# Patient Record
Sex: Female | Born: 1935 | Race: White | Hispanic: No | Smoking: Former smoker
Health system: Southern US, Community
[De-identification: ages and names within clinical notes are randomized; demographics above are authoritative.]

## PROBLEM LIST (undated history)

## (undated) DIAGNOSIS — F329 Major depressive disorder, single episode, unspecified: Secondary | ICD-10-CM

## (undated) DIAGNOSIS — H3530 Unspecified macular degeneration: Secondary | ICD-10-CM

## (undated) DIAGNOSIS — I340 Nonrheumatic mitral (valve) insufficiency: Secondary | ICD-10-CM

## (undated) DIAGNOSIS — R17 Unspecified jaundice: Secondary | ICD-10-CM

## (undated) DIAGNOSIS — E785 Hyperlipidemia, unspecified: Secondary | ICD-10-CM

## (undated) DIAGNOSIS — E042 Nontoxic multinodular goiter: Secondary | ICD-10-CM

## (undated) DIAGNOSIS — I4891 Unspecified atrial fibrillation: Secondary | ICD-10-CM

## (undated) DIAGNOSIS — I2510 Atherosclerotic heart disease of native coronary artery without angina pectoris: Secondary | ICD-10-CM

## (undated) DIAGNOSIS — H9190 Unspecified hearing loss, unspecified ear: Secondary | ICD-10-CM

## (undated) DIAGNOSIS — D509 Iron deficiency anemia, unspecified: Secondary | ICD-10-CM

## (undated) DIAGNOSIS — I422 Other hypertrophic cardiomyopathy: Secondary | ICD-10-CM

## (undated) DIAGNOSIS — F32A Depression, unspecified: Secondary | ICD-10-CM

## (undated) DIAGNOSIS — Z86010 Personal history of colonic polyps: Secondary | ICD-10-CM

## (undated) DIAGNOSIS — M1990 Unspecified osteoarthritis, unspecified site: Secondary | ICD-10-CM

## (undated) DIAGNOSIS — K2270 Barrett's esophagus without dysplasia: Secondary | ICD-10-CM

## (undated) DIAGNOSIS — K5730 Diverticulosis of large intestine without perforation or abscess without bleeding: Secondary | ICD-10-CM

## (undated) DIAGNOSIS — I350 Nonrheumatic aortic (valve) stenosis: Secondary | ICD-10-CM

## (undated) DIAGNOSIS — H548 Legal blindness, as defined in USA: Secondary | ICD-10-CM

## (undated) DIAGNOSIS — N888 Other specified noninflammatory disorders of cervix uteri: Secondary | ICD-10-CM

## (undated) DIAGNOSIS — K219 Gastro-esophageal reflux disease without esophagitis: Secondary | ICD-10-CM

## (undated) DIAGNOSIS — E039 Hypothyroidism, unspecified: Secondary | ICD-10-CM

## (undated) DIAGNOSIS — I10 Essential (primary) hypertension: Secondary | ICD-10-CM

## (undated) HISTORY — DX: Diverticulosis of large intestine without perforation or abscess without bleeding: K57.30

## (undated) HISTORY — PX: INCONTINENCE SURGERY: SHX676

## (undated) HISTORY — DX: Hypothyroidism, unspecified: E03.9

## (undated) HISTORY — DX: Personal history of colonic polyps: Z86.010

## (undated) HISTORY — DX: Nontoxic multinodular goiter: E04.2

## (undated) HISTORY — PX: KNEE ARTHROSCOPY: SHX127

## (undated) HISTORY — DX: Essential (primary) hypertension: I10

## (undated) HISTORY — DX: Unspecified atrial fibrillation: I48.91

## (undated) HISTORY — PX: TONSILLECTOMY AND ADENOIDECTOMY: SUR1326

## (undated) HISTORY — DX: Other hypertrophic cardiomyopathy: I42.2

## (undated) HISTORY — DX: Unspecified osteoarthritis, unspecified site: M19.90

## (undated) HISTORY — PX: ROTATOR CUFF REPAIR: SHX139

## (undated) HISTORY — DX: Iron deficiency anemia, unspecified: D50.9

## (undated) HISTORY — DX: Other specified noninflammatory disorders of cervix uteri: N88.8

## (undated) HISTORY — DX: Barrett's esophagus without dysplasia: K22.70

## (undated) HISTORY — PX: DOPPLER ECHOCARDIOGRAPHY: SHX263

---
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
## 1979-12-07 HISTORY — PX: REDUCTION MAMMAPLASTY: SUR839

## 1989-08-06 HISTORY — PX: BLADDER SURGERY: SHX569

## 1998-11-05 LAB — HM DEXA SCAN

## 1998-11-11 ENCOUNTER — Other Ambulatory Visit: Admission: RE | Admit: 1998-11-11 | Discharge: 1998-11-11 | Payer: Self-pay | Admitting: Family Medicine

## 1999-04-20 ENCOUNTER — Other Ambulatory Visit: Admission: RE | Admit: 1999-04-20 | Discharge: 1999-04-20 | Payer: Self-pay | Admitting: Obstetrics and Gynecology

## 1999-04-21 ENCOUNTER — Other Ambulatory Visit: Admission: RE | Admit: 1999-04-21 | Discharge: 1999-04-21 | Payer: Self-pay | Admitting: Obstetrics and Gynecology

## 2000-06-06 ENCOUNTER — Encounter: Payer: Self-pay | Admitting: Family Medicine

## 2000-06-06 ENCOUNTER — Encounter: Admission: RE | Admit: 2000-06-06 | Discharge: 2000-06-06 | Payer: Self-pay | Admitting: Family Medicine

## 2000-07-05 ENCOUNTER — Other Ambulatory Visit: Admission: RE | Admit: 2000-07-05 | Discharge: 2000-07-05 | Payer: Self-pay | Admitting: Obstetrics and Gynecology

## 2000-07-05 ENCOUNTER — Encounter (INDEPENDENT_AMBULATORY_CARE_PROVIDER_SITE_OTHER): Payer: Self-pay | Admitting: Specialist

## 2000-08-25 ENCOUNTER — Other Ambulatory Visit: Admission: RE | Admit: 2000-08-25 | Discharge: 2000-08-25 | Payer: Self-pay | Admitting: Gastroenterology

## 2000-08-25 ENCOUNTER — Encounter (INDEPENDENT_AMBULATORY_CARE_PROVIDER_SITE_OTHER): Payer: Self-pay | Admitting: Specialist

## 2000-09-02 ENCOUNTER — Ambulatory Visit (HOSPITAL_COMMUNITY): Admission: RE | Admit: 2000-09-02 | Discharge: 2000-09-02 | Payer: Self-pay | Admitting: Gastroenterology

## 2000-09-02 ENCOUNTER — Encounter: Payer: Self-pay | Admitting: Gastroenterology

## 2000-09-05 ENCOUNTER — Encounter: Payer: Self-pay | Admitting: Obstetrics and Gynecology

## 2000-09-07 ENCOUNTER — Ambulatory Visit (HOSPITAL_COMMUNITY): Admission: RE | Admit: 2000-09-07 | Discharge: 2000-09-07 | Payer: Self-pay | Admitting: Obstetrics and Gynecology

## 2000-09-07 ENCOUNTER — Encounter (INDEPENDENT_AMBULATORY_CARE_PROVIDER_SITE_OTHER): Payer: Self-pay | Admitting: Specialist

## 2000-09-07 HISTORY — PX: DILATION AND CURETTAGE OF UTERUS: SHX78

## 2001-12-06 HISTORY — PX: CATARACT EXTRACTION, BILATERAL: SHX1313

## 2003-07-29 HISTORY — PX: TEAR DUCT PROBING: SHX793

## 2003-09-17 ENCOUNTER — Encounter: Payer: Self-pay | Admitting: Urology

## 2003-09-17 ENCOUNTER — Ambulatory Visit (HOSPITAL_BASED_OUTPATIENT_CLINIC_OR_DEPARTMENT_OTHER): Admission: RE | Admit: 2003-09-17 | Discharge: 2003-09-17 | Payer: Self-pay | Admitting: Urology

## 2003-09-17 ENCOUNTER — Ambulatory Visit (HOSPITAL_COMMUNITY): Admission: RE | Admit: 2003-09-17 | Discharge: 2003-09-17 | Payer: Self-pay | Admitting: Urology

## 2003-09-17 HISTORY — PX: CYSTOURETHROSCOPY: SHX476

## 2003-09-26 ENCOUNTER — Ambulatory Visit (HOSPITAL_COMMUNITY): Admission: RE | Admit: 2003-09-26 | Discharge: 2003-09-26 | Payer: Self-pay | Admitting: *Deleted

## 2003-09-26 ENCOUNTER — Encounter: Payer: Self-pay | Admitting: *Deleted

## 2003-10-14 ENCOUNTER — Ambulatory Visit (HOSPITAL_COMMUNITY): Admission: RE | Admit: 2003-10-14 | Discharge: 2003-10-14 | Payer: Self-pay | Admitting: Cardiology

## 2003-10-14 HISTORY — PX: OTHER SURGICAL HISTORY: SHX169

## 2004-02-04 ENCOUNTER — Encounter: Payer: Self-pay | Admitting: Family Medicine

## 2004-12-16 ENCOUNTER — Ambulatory Visit: Payer: Self-pay | Admitting: Family Medicine

## 2004-12-25 ENCOUNTER — Ambulatory Visit: Payer: Self-pay | Admitting: Family Medicine

## 2004-12-29 ENCOUNTER — Ambulatory Visit: Payer: Self-pay | Admitting: Family Medicine

## 2005-01-20 ENCOUNTER — Ambulatory Visit: Payer: Self-pay | Admitting: Family Medicine

## 2005-01-29 ENCOUNTER — Ambulatory Visit: Payer: Self-pay | Admitting: Family Medicine

## 2005-05-05 ENCOUNTER — Ambulatory Visit: Payer: Self-pay | Admitting: Family Medicine

## 2005-08-31 ENCOUNTER — Ambulatory Visit (HOSPITAL_COMMUNITY): Admission: RE | Admit: 2005-08-31 | Discharge: 2005-09-01 | Payer: Self-pay | Admitting: Orthopedic Surgery

## 2005-09-20 ENCOUNTER — Ambulatory Visit: Payer: Self-pay | Admitting: Family Medicine

## 2006-01-05 ENCOUNTER — Ambulatory Visit: Payer: Self-pay | Admitting: Family Medicine

## 2006-08-09 ENCOUNTER — Ambulatory Visit: Payer: Self-pay | Admitting: Family Medicine

## 2006-08-11 ENCOUNTER — Ambulatory Visit: Payer: Self-pay | Admitting: Family Medicine

## 2006-08-16 ENCOUNTER — Ambulatory Visit: Payer: Self-pay | Admitting: Family Medicine

## 2006-08-26 ENCOUNTER — Ambulatory Visit: Payer: Self-pay | Admitting: Family Medicine

## 2006-08-28 ENCOUNTER — Observation Stay (HOSPITAL_COMMUNITY): Admission: EM | Admit: 2006-08-28 | Discharge: 2006-08-29 | Payer: Self-pay | Admitting: Emergency Medicine

## 2006-08-29 ENCOUNTER — Ambulatory Visit: Payer: Self-pay | Admitting: Cardiology

## 2006-08-29 ENCOUNTER — Ambulatory Visit: Payer: Self-pay | Admitting: Internal Medicine

## 2006-08-29 ENCOUNTER — Encounter: Payer: Self-pay | Admitting: Cardiology

## 2006-08-31 ENCOUNTER — Ambulatory Visit: Payer: Self-pay | Admitting: Family Medicine

## 2006-09-20 ENCOUNTER — Ambulatory Visit: Payer: Self-pay | Admitting: Family Medicine

## 2006-09-28 ENCOUNTER — Emergency Department (HOSPITAL_COMMUNITY): Admission: EM | Admit: 2006-09-28 | Discharge: 2006-09-28 | Payer: Self-pay | Admitting: Emergency Medicine

## 2006-10-10 ENCOUNTER — Ambulatory Visit: Payer: Self-pay | Admitting: Family Medicine

## 2006-10-19 ENCOUNTER — Ambulatory Visit: Payer: Self-pay | Admitting: Cardiology

## 2006-10-21 ENCOUNTER — Ambulatory Visit: Payer: Self-pay

## 2006-10-24 ENCOUNTER — Ambulatory Visit: Payer: Self-pay | Admitting: Family Medicine

## 2006-11-07 ENCOUNTER — Ambulatory Visit: Payer: Self-pay | Admitting: Family Medicine

## 2006-11-25 ENCOUNTER — Ambulatory Visit: Payer: Self-pay | Admitting: Family Medicine

## 2006-12-05 ENCOUNTER — Ambulatory Visit: Payer: Self-pay | Admitting: Family Medicine

## 2006-12-09 ENCOUNTER — Ambulatory Visit: Payer: Self-pay | Admitting: Family Medicine

## 2006-12-15 ENCOUNTER — Ambulatory Visit: Payer: Self-pay | Admitting: Cardiology

## 2006-12-20 ENCOUNTER — Ambulatory Visit: Payer: Self-pay | Admitting: Family Medicine

## 2006-12-26 ENCOUNTER — Ambulatory Visit: Payer: Self-pay | Admitting: Internal Medicine

## 2006-12-26 LAB — CONVERTED CEMR LAB
BUN: 8 mg/dL (ref 6–23)
GFR calc Af Amer: 68 mL/min
GFR calc non Af Amer: 56 mL/min
Potassium: 3.8 meq/L (ref 3.5–5.1)
Sodium: 139 meq/L (ref 135–145)

## 2007-01-17 ENCOUNTER — Ambulatory Visit: Payer: Self-pay | Admitting: Family Medicine

## 2007-01-24 ENCOUNTER — Ambulatory Visit: Payer: Self-pay | Admitting: Family Medicine

## 2007-02-07 ENCOUNTER — Ambulatory Visit: Payer: Self-pay | Admitting: Family Medicine

## 2007-02-22 ENCOUNTER — Ambulatory Visit: Payer: Self-pay | Admitting: Family Medicine

## 2007-03-08 ENCOUNTER — Ambulatory Visit: Payer: Self-pay | Admitting: Family Medicine

## 2007-03-22 ENCOUNTER — Ambulatory Visit: Payer: Self-pay | Admitting: Family Medicine

## 2007-03-27 ENCOUNTER — Ambulatory Visit: Payer: Self-pay | Admitting: Family Medicine

## 2007-03-30 ENCOUNTER — Ambulatory Visit: Payer: Self-pay | Admitting: Cardiology

## 2007-04-04 ENCOUNTER — Ambulatory Visit: Payer: Self-pay | Admitting: Family Medicine

## 2007-05-04 ENCOUNTER — Ambulatory Visit: Payer: Self-pay | Admitting: Family Medicine

## 2007-05-04 LAB — CONVERTED CEMR LAB: INR: 1.4

## 2007-05-19 ENCOUNTER — Ambulatory Visit: Payer: Self-pay | Admitting: Family Medicine

## 2007-05-19 LAB — CONVERTED CEMR LAB
INR: 3.4
Prothrombin Time: 22.2 s

## 2007-06-01 ENCOUNTER — Ambulatory Visit: Payer: Self-pay | Admitting: Family Medicine

## 2007-06-15 ENCOUNTER — Ambulatory Visit: Payer: Self-pay | Admitting: Family Medicine

## 2007-06-22 ENCOUNTER — Ambulatory Visit: Payer: Self-pay | Admitting: Family Medicine

## 2007-06-22 LAB — CONVERTED CEMR LAB
Bilirubin Urine: NEGATIVE
Glucose, Urine, Semiquant: NEGATIVE
Ketones, urine, test strip: NEGATIVE
Protein, U semiquant: NEGATIVE
Specific Gravity, Urine: 1.01
pH: 6

## 2007-07-14 ENCOUNTER — Ambulatory Visit: Payer: Self-pay | Admitting: Family Medicine

## 2007-07-14 LAB — CONVERTED CEMR LAB: INR: 2.4

## 2007-08-08 ENCOUNTER — Encounter: Payer: Self-pay | Admitting: Family Medicine

## 2007-08-08 DIAGNOSIS — E042 Nontoxic multinodular goiter: Secondary | ICD-10-CM

## 2007-08-08 DIAGNOSIS — R32 Unspecified urinary incontinence: Secondary | ICD-10-CM | POA: Insufficient documentation

## 2007-08-08 DIAGNOSIS — K5730 Diverticulosis of large intestine without perforation or abscess without bleeding: Secondary | ICD-10-CM | POA: Insufficient documentation

## 2007-08-08 DIAGNOSIS — M1990 Unspecified osteoarthritis, unspecified site: Secondary | ICD-10-CM | POA: Insufficient documentation

## 2007-08-08 DIAGNOSIS — I10 Essential (primary) hypertension: Secondary | ICD-10-CM

## 2007-08-08 DIAGNOSIS — E039 Hypothyroidism, unspecified: Secondary | ICD-10-CM

## 2007-08-08 DIAGNOSIS — M810 Age-related osteoporosis without current pathological fracture: Secondary | ICD-10-CM | POA: Insufficient documentation

## 2007-08-11 ENCOUNTER — Ambulatory Visit: Payer: Self-pay | Admitting: Family Medicine

## 2007-08-11 LAB — CONVERTED CEMR LAB: INR: 2.4

## 2007-08-16 ENCOUNTER — Ambulatory Visit: Payer: Self-pay | Admitting: Family Medicine

## 2007-08-16 LAB — CONVERTED CEMR LAB
ALT: 14 units/L (ref 0–35)
Albumin: 3.3 g/dL — ABNORMAL LOW (ref 3.5–5.2)
Alkaline Phosphatase: 54 units/L (ref 39–117)
BUN: 11 mg/dL (ref 6–23)
Basophils Relative: 0.4 % (ref 0.0–1.0)
Bilirubin, Direct: 0.1 mg/dL (ref 0.0–0.3)
CO2: 27 meq/L (ref 19–32)
Calcium: 8.8 mg/dL (ref 8.4–10.5)
Chloride: 107 meq/L (ref 96–112)
Cholesterol: 208 mg/dL (ref 0–200)
Creatinine, Ser: 0.8 mg/dL (ref 0.4–1.2)
Direct LDL: 132.8 mg/dL
Free T4: 0.9 ng/dL (ref 0.6–1.6)
GFR calc Af Amer: 88 mL/min
HCT: 32.5 % — ABNORMAL LOW (ref 36.0–46.0)
HDL: 54 mg/dL (ref >39.0)
Monocytes Absolute: 0.6 10*3/uL (ref 0.2–0.7)
Neutro Abs: 2.9 10*3/uL (ref 1.4–7.7)
Neutrophils Relative %: 58.5 % (ref 43.0–77.0)
Platelets: 258 10*3/uL (ref 150–400)
Potassium: 4.4 meq/L (ref 3.5–5.1)
RDW: 14.6 % (ref 11.5–14.6)
Total Bilirubin: 0.5 mg/dL (ref 0.3–1.2)
Total CHOL/HDL Ratio: 3.9
Total Protein: 5.9 g/dL — ABNORMAL LOW (ref 6.0–8.3)
Triglycerides: 103 mg/dL (ref 0–149)
VLDL: 21 mg/dL (ref 0–40)

## 2007-08-18 ENCOUNTER — Ambulatory Visit: Payer: Self-pay | Admitting: Family Medicine

## 2007-08-18 DIAGNOSIS — M549 Dorsalgia, unspecified: Secondary | ICD-10-CM | POA: Insufficient documentation

## 2007-09-11 ENCOUNTER — Ambulatory Visit: Payer: Self-pay | Admitting: Family Medicine

## 2007-09-15 ENCOUNTER — Ambulatory Visit: Payer: Self-pay | Admitting: Internal Medicine

## 2007-09-15 ENCOUNTER — Ambulatory Visit: Payer: Self-pay | Admitting: Family Medicine

## 2007-09-15 LAB — CONVERTED CEMR LAB: Prothrombin Time: 17 s

## 2007-10-09 ENCOUNTER — Ambulatory Visit: Payer: Self-pay | Admitting: Cardiology

## 2007-10-13 ENCOUNTER — Ambulatory Visit: Payer: Self-pay | Admitting: Family Medicine

## 2007-10-13 LAB — CONVERTED CEMR LAB: Prothrombin Time: 15.8 s

## 2007-10-27 ENCOUNTER — Ambulatory Visit: Payer: Self-pay | Admitting: Family Medicine

## 2007-11-01 ENCOUNTER — Ambulatory Visit: Payer: Self-pay | Admitting: Family Medicine

## 2007-11-01 DIAGNOSIS — D509 Iron deficiency anemia, unspecified: Secondary | ICD-10-CM | POA: Insufficient documentation

## 2007-11-01 LAB — CONVERTED CEMR LAB
Eosinophils Relative: 3 % (ref 0.0–5.0)
Ferritin: 6.4 ng/mL — ABNORMAL LOW (ref 10.0–291.0)
Iron: 21 ug/dL — ABNORMAL LOW (ref 42–145)
RDW: 14.9 % — ABNORMAL HIGH (ref 11.5–14.6)

## 2007-11-07 ENCOUNTER — Ambulatory Visit: Payer: Self-pay | Admitting: Family Medicine

## 2007-11-23 ENCOUNTER — Ambulatory Visit: Payer: Self-pay | Admitting: Family Medicine

## 2007-11-23 LAB — CONVERTED CEMR LAB
INR: 2.9
Prothrombin Time: 20.4 s

## 2007-11-27 ENCOUNTER — Telehealth: Payer: Self-pay | Admitting: Family Medicine

## 2007-12-21 ENCOUNTER — Ambulatory Visit: Payer: Self-pay | Admitting: Family Medicine

## 2007-12-21 LAB — CONVERTED CEMR LAB
INR: 2.9
Prothrombin Time: 20.7 s

## 2008-01-18 ENCOUNTER — Ambulatory Visit: Payer: Self-pay | Admitting: Family Medicine

## 2008-01-18 LAB — CONVERTED CEMR LAB

## 2008-02-01 ENCOUNTER — Ambulatory Visit: Payer: Self-pay | Admitting: Family Medicine

## 2008-02-01 LAB — CONVERTED CEMR LAB: INR: 1.9

## 2008-02-06 ENCOUNTER — Ambulatory Visit: Payer: Self-pay | Admitting: Family Medicine

## 2008-02-07 LAB — CONVERTED CEMR LAB
Basophils Relative: 0.9 % (ref 0.0–1.0)
HCT: 38.3 % (ref 36.0–46.0)
Hemoglobin: 12.1 g/dL (ref 12.0–15.0)
Lymphocytes Relative: 27.4 % (ref 12.0–46.0)
MCHC: 31.6 g/dL (ref 30.0–36.0)
MCV: 80 fL (ref 78.0–100.0)
Monocytes Relative: 10.7 % (ref 3.0–11.0)
RDW: 16 % — ABNORMAL HIGH (ref 11.5–14.6)

## 2008-02-08 ENCOUNTER — Ambulatory Visit: Payer: Self-pay | Admitting: Family Medicine

## 2008-02-13 ENCOUNTER — Emergency Department (HOSPITAL_COMMUNITY): Admission: EM | Admit: 2008-02-13 | Discharge: 2008-02-13 | Payer: Self-pay | Admitting: Emergency Medicine

## 2008-02-29 ENCOUNTER — Ambulatory Visit: Payer: Self-pay | Admitting: Family Medicine

## 2008-02-29 LAB — CONVERTED CEMR LAB
INR: 2.2
Prothrombin Time: 18.1 s

## 2008-03-01 ENCOUNTER — Ambulatory Visit: Payer: Self-pay | Admitting: Cardiology

## 2008-03-07 ENCOUNTER — Ambulatory Visit: Payer: Self-pay | Admitting: Cardiology

## 2008-03-07 LAB — CONVERTED CEMR LAB
BUN: 13 mg/dL (ref 6–23)
Calcium: 8.8 mg/dL (ref 8.4–10.5)
Chloride: 105 meq/L (ref 96–112)
Glucose, Bld: 121 mg/dL — ABNORMAL HIGH (ref 70–99)
Potassium: 3.8 meq/L (ref 3.5–5.1)

## 2008-03-19 ENCOUNTER — Ambulatory Visit: Payer: Self-pay

## 2008-03-19 ENCOUNTER — Encounter: Payer: Self-pay | Admitting: Cardiology

## 2008-03-29 ENCOUNTER — Ambulatory Visit: Payer: Self-pay | Admitting: Family Medicine

## 2008-03-29 LAB — CONVERTED CEMR LAB: INR: 1.6

## 2008-04-11 ENCOUNTER — Ambulatory Visit: Payer: Self-pay | Admitting: Family Medicine

## 2008-04-11 LAB — CONVERTED CEMR LAB
INR: 3.9
Prothrombin Time: 24.8 s

## 2008-05-09 ENCOUNTER — Ambulatory Visit: Payer: Self-pay | Admitting: Family Medicine

## 2008-05-09 LAB — CONVERTED CEMR LAB
Basophils Relative: 0 % (ref 0.0–1.0)
Eosinophils Relative: 1.6 % (ref 0.0–5.0)
Lymphocytes Relative: 31.8 % (ref 12.0–46.0)
Neutrophils Relative %: 53.3 % (ref 43.0–77.0)
Platelets: 238 10*3/uL (ref 150–400)
RBC: 4.31 M/uL (ref 3.87–5.11)
WBC: 5.3 10*3/uL (ref 4.5–10.5)

## 2008-05-14 ENCOUNTER — Ambulatory Visit: Payer: Self-pay | Admitting: Family Medicine

## 2008-05-14 LAB — CONVERTED CEMR LAB
INR: 1.4
Prothrombin Time: 14.6 s

## 2008-05-16 ENCOUNTER — Ambulatory Visit: Payer: Self-pay | Admitting: Cardiology

## 2008-05-28 ENCOUNTER — Ambulatory Visit: Payer: Self-pay | Admitting: Family Medicine

## 2008-05-28 LAB — CONVERTED CEMR LAB
INR: 2.5
Prothrombin Time: 19.2 s

## 2008-06-10 ENCOUNTER — Telehealth: Payer: Self-pay | Admitting: Family Medicine

## 2008-06-11 ENCOUNTER — Ambulatory Visit: Payer: Self-pay | Admitting: Family Medicine

## 2008-06-11 LAB — CONVERTED CEMR LAB
Epithelial cells, urine: 0 /lpf
Glucose, Urine, Semiquant: NEGATIVE
Specific Gravity, Urine: <1.005
Urobilinogen, UA: 0.2

## 2008-06-12 ENCOUNTER — Encounter: Payer: Self-pay | Admitting: Family Medicine

## 2008-06-24 ENCOUNTER — Ambulatory Visit: Payer: Self-pay | Admitting: Family Medicine

## 2008-06-24 LAB — CONVERTED CEMR LAB
INR: 2.4
Prothrombin Time: 19 s

## 2008-07-18 ENCOUNTER — Ambulatory Visit: Payer: Self-pay | Admitting: Family Medicine

## 2008-07-18 DIAGNOSIS — R209 Unspecified disturbances of skin sensation: Secondary | ICD-10-CM | POA: Insufficient documentation

## 2008-07-22 ENCOUNTER — Ambulatory Visit: Payer: Self-pay | Admitting: Family Medicine

## 2008-07-22 LAB — CONVERTED CEMR LAB: Prothrombin Time: 25.2 s

## 2008-07-24 ENCOUNTER — Ambulatory Visit: Payer: Self-pay | Admitting: Family Medicine

## 2008-07-24 DIAGNOSIS — M25539 Pain in unspecified wrist: Secondary | ICD-10-CM

## 2008-08-05 ENCOUNTER — Ambulatory Visit: Payer: Self-pay | Admitting: Family Medicine

## 2008-08-19 ENCOUNTER — Ambulatory Visit: Payer: Self-pay | Admitting: Family Medicine

## 2008-08-19 LAB — CONVERTED CEMR LAB
INR: 1.2
Prothrombin Time: 13.6 s

## 2008-08-22 ENCOUNTER — Telehealth: Payer: Self-pay | Admitting: Family Medicine

## 2008-08-26 ENCOUNTER — Ambulatory Visit: Payer: Self-pay | Admitting: Family Medicine

## 2008-08-26 LAB — CONVERTED CEMR LAB: INR: 1.9

## 2008-09-09 ENCOUNTER — Ambulatory Visit: Payer: Self-pay | Admitting: Family Medicine

## 2008-09-23 ENCOUNTER — Ambulatory Visit: Payer: Self-pay | Admitting: Family Medicine

## 2008-09-24 LAB — CONVERTED CEMR LAB
INR: 1.2 — ABNORMAL HIGH (ref 0.8–1.0)
Prothrombin Time: 13.9 s — ABNORMAL HIGH (ref 10.9–13.3)

## 2008-10-08 ENCOUNTER — Ambulatory Visit: Payer: Self-pay | Admitting: Family Medicine

## 2008-10-08 LAB — CONVERTED CEMR LAB
INR: 4
Prothrombin Time: 24.2 s

## 2008-10-23 ENCOUNTER — Ambulatory Visit: Payer: Self-pay | Admitting: Family Medicine

## 2008-10-23 LAB — CONVERTED CEMR LAB: Prothrombin Time: 15.6 s

## 2008-10-30 ENCOUNTER — Ambulatory Visit: Payer: Self-pay | Admitting: Family Medicine

## 2008-10-30 LAB — CONVERTED CEMR LAB: INR: 2.8

## 2008-11-13 ENCOUNTER — Ambulatory Visit: Payer: Self-pay | Admitting: Cardiology

## 2008-11-13 LAB — CONVERTED CEMR LAB
BUN: 11 mg/dL (ref 6–23)
Basophils Relative: 0.6 % (ref 0.0–3.0)
Calcium: 8.7 mg/dL (ref 8.4–10.5)
Creatinine, Ser: 0.7 mg/dL (ref 0.4–1.2)
Eosinophils Absolute: 0.1 10*3/uL (ref 0.0–0.7)
Eosinophils Relative: 1.8 % (ref 0.0–5.0)
GFR calc Af Amer: 103 mL/min
GFR calc non Af Amer: 85 mL/min
Glucose, Bld: 92 mg/dL (ref 70–99)
HCT: 39.4 % (ref 36.0–46.0)
Hemoglobin: 13.3 g/dL (ref 12.0–15.0)
MCV: 88.2 fL (ref 78.0–100.0)
Monocytes Absolute: 0.7 10*3/uL (ref 0.1–1.0)
Monocytes Relative: 10.3 % (ref 3.0–12.0)
Neutro Abs: 4.1 10*3/uL (ref 1.4–7.7)
Potassium: 3.9 meq/L (ref 3.5–5.1)
RBC: 4.46 M/uL (ref 3.87–5.11)
WBC: 6.4 10*3/uL (ref 4.5–10.5)

## 2008-11-25 ENCOUNTER — Ambulatory Visit: Payer: Self-pay | Admitting: Family Medicine

## 2008-12-06 HISTORY — PX: FRACTURE SURGERY: SHX138

## 2008-12-09 ENCOUNTER — Ambulatory Visit: Payer: Self-pay | Admitting: Family Medicine

## 2008-12-09 LAB — CONVERTED CEMR LAB: Prothrombin Time: 15.7 s

## 2008-12-23 ENCOUNTER — Ambulatory Visit: Payer: Self-pay | Admitting: Family Medicine

## 2008-12-23 LAB — CONVERTED CEMR LAB: INR: 2.7

## 2008-12-26 ENCOUNTER — Ambulatory Visit: Payer: Self-pay | Admitting: Family Medicine

## 2009-01-13 ENCOUNTER — Ambulatory Visit: Payer: Self-pay | Admitting: Family Medicine

## 2009-01-13 DIAGNOSIS — S8000XA Contusion of unspecified knee, initial encounter: Secondary | ICD-10-CM | POA: Insufficient documentation

## 2009-01-13 LAB — CONVERTED CEMR LAB
Basophils Absolute: 0 10*3/uL (ref 0.0–0.1)
Basophils Relative: 0.7 % (ref 0.0–3.0)
Bilirubin, Direct: 0.1 mg/dL (ref 0.0–0.3)
Calcium: 8.7 mg/dL (ref 8.4–10.5)
Cholesterol: 196 mg/dL (ref 0–200)
Creatinine, Ser: 0.8 mg/dL (ref 0.4–1.2)
Creatinine,U: 48.4 mg/dL
Eosinophils Absolute: 0.1 10*3/uL (ref 0.0–0.7)
Ferritin: 15.2 ng/mL (ref 10.0–291.0)
GFR calc non Af Amer: 73 mL/min
Hemoglobin: 12.4 g/dL (ref 12.0–15.0)
MCHC: 34.3 g/dL (ref 30.0–36.0)
MCV: 88.8 fL (ref 78.0–100.0)
Microalb Creat Ratio: 12.4 mg/g (ref 0.0–30.0)
Microalb, Ur: 0.6 mg/dL (ref 0.0–1.9)
Neutro Abs: 3.2 10*3/uL (ref 1.4–7.7)
Neutrophils Relative %: 59.3 % (ref 43.0–77.0)
RBC: 4.08 M/uL (ref 3.87–5.11)
RDW: 14 % (ref 11.5–14.6)
Sodium: 138 meq/L (ref 135–145)
Total Bilirubin: 0.5 mg/dL (ref 0.3–1.2)
Triglycerides: 97 mg/dL (ref 0–149)
VLDL: 19 mg/dL (ref 0–40)
Vitamin B-12: 385 pg/mL (ref 211–911)

## 2009-01-16 ENCOUNTER — Ambulatory Visit: Payer: Self-pay | Admitting: Family Medicine

## 2009-01-17 ENCOUNTER — Encounter: Payer: Self-pay | Admitting: Family Medicine

## 2009-01-23 ENCOUNTER — Ambulatory Visit: Payer: Self-pay | Admitting: Family Medicine

## 2009-01-30 ENCOUNTER — Ambulatory Visit: Payer: Self-pay | Admitting: Family Medicine

## 2009-01-30 LAB — CONVERTED CEMR LAB: INR: 2.3

## 2009-02-06 ENCOUNTER — Ambulatory Visit: Payer: Self-pay | Admitting: Family Medicine

## 2009-02-06 LAB — CONVERTED CEMR LAB
INR: 3.3
Prothrombin Time: 21.8 s

## 2009-02-13 ENCOUNTER — Ambulatory Visit: Payer: Self-pay | Admitting: Family Medicine

## 2009-02-13 LAB — CONVERTED CEMR LAB
INR: 1.6
Prothrombin Time: 15.6 s

## 2009-02-27 ENCOUNTER — Ambulatory Visit: Payer: Self-pay | Admitting: Family Medicine

## 2009-03-27 ENCOUNTER — Ambulatory Visit: Payer: Self-pay | Admitting: Family Medicine

## 2009-03-27 LAB — CONVERTED CEMR LAB: INR: 1.9

## 2009-04-04 ENCOUNTER — Encounter: Payer: Self-pay | Admitting: Family Medicine

## 2009-04-04 LAB — HM MAMMOGRAPHY: HM Mammogram: NORMAL

## 2009-04-17 ENCOUNTER — Ambulatory Visit: Payer: Self-pay | Admitting: Family Medicine

## 2009-04-19 ENCOUNTER — Inpatient Hospital Stay (HOSPITAL_COMMUNITY): Admission: EM | Admit: 2009-04-19 | Discharge: 2009-04-25 | Payer: Self-pay | Admitting: Emergency Medicine

## 2009-04-23 ENCOUNTER — Encounter (INDEPENDENT_AMBULATORY_CARE_PROVIDER_SITE_OTHER): Payer: Self-pay | Admitting: General Surgery

## 2009-04-23 ENCOUNTER — Ambulatory Visit: Payer: Self-pay | Admitting: Vascular Surgery

## 2009-05-08 DIAGNOSIS — D649 Anemia, unspecified: Secondary | ICD-10-CM | POA: Insufficient documentation

## 2009-05-08 DIAGNOSIS — I428 Other cardiomyopathies: Secondary | ICD-10-CM | POA: Insufficient documentation

## 2009-05-08 DIAGNOSIS — J309 Allergic rhinitis, unspecified: Secondary | ICD-10-CM | POA: Insufficient documentation

## 2009-05-09 ENCOUNTER — Ambulatory Visit: Payer: Self-pay | Admitting: Cardiology

## 2009-05-09 DIAGNOSIS — I421 Obstructive hypertrophic cardiomyopathy: Secondary | ICD-10-CM

## 2009-05-26 ENCOUNTER — Ambulatory Visit: Payer: Self-pay

## 2009-05-26 ENCOUNTER — Encounter: Payer: Self-pay | Admitting: Cardiology

## 2009-06-02 ENCOUNTER — Encounter: Admission: RE | Admit: 2009-06-02 | Discharge: 2009-06-02 | Payer: Self-pay | Admitting: Neurosurgery

## 2009-06-30 ENCOUNTER — Encounter: Admission: RE | Admit: 2009-06-30 | Discharge: 2009-06-30 | Payer: Self-pay | Admitting: Neurosurgery

## 2009-07-14 ENCOUNTER — Telehealth: Payer: Self-pay | Admitting: Family Medicine

## 2009-07-17 ENCOUNTER — Ambulatory Visit: Payer: Self-pay | Admitting: Family Medicine

## 2009-07-17 LAB — CONVERTED CEMR LAB
Bilirubin Urine: NEGATIVE
Specific Gravity, Urine: 1.015
Urobilinogen, UA: 0.2
pH: 6.5

## 2009-07-19 ENCOUNTER — Encounter: Payer: Self-pay | Admitting: Family Medicine

## 2009-08-27 IMAGING — CT CT HEAD W/O CM
1 series · 16 of 28 positions shown, 20 images · non-contrast
Comparison: 06/02/2009

CLINICAL DATA: Head fracture.  Follow-up

CT HEAD WITHOUT CONTRAST
TECHNIQUE: Contiguous axial images were obtained from the base of
the skull through the vertex without contrast.

[Series 32: 3d filtered head · axial · 0.49mm/px · z∈[+17,+146]mm · 16 of 28 slices shown, 20 images]
[im 2/28  brain]
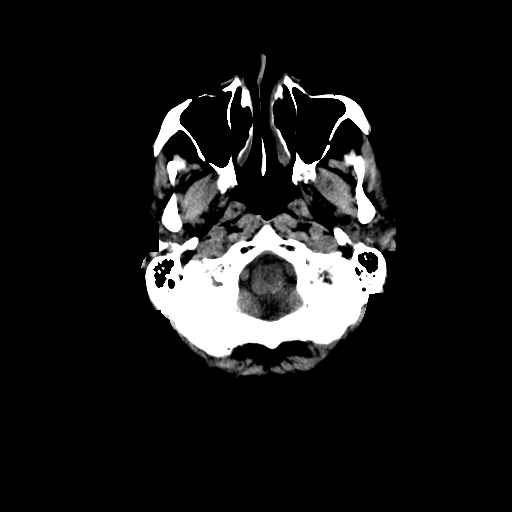
[im 2/28  bone]
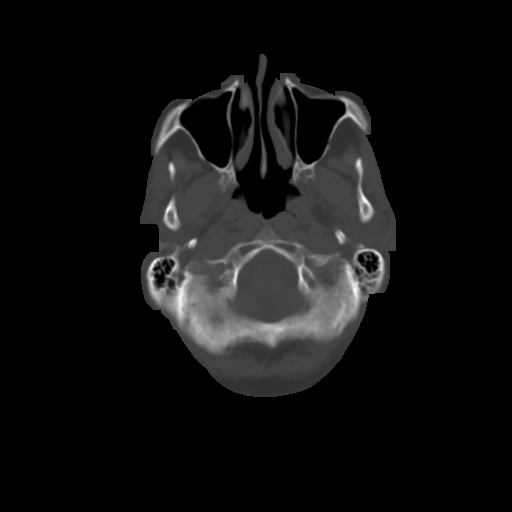
[im 4/28  brain]
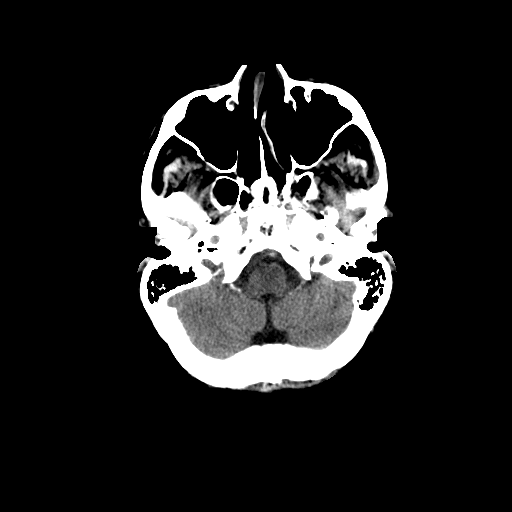
[im 6/28  brain]
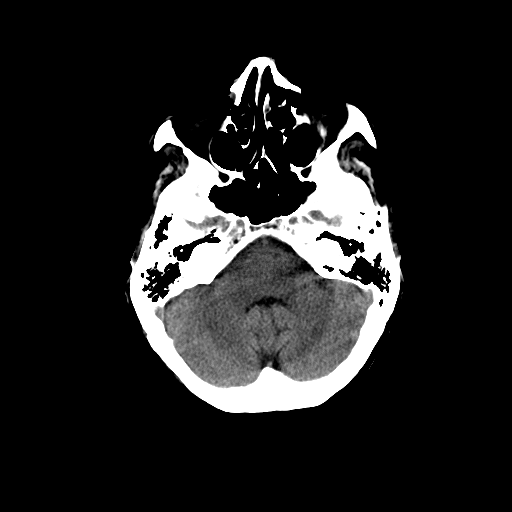
[im 7/28  brain]
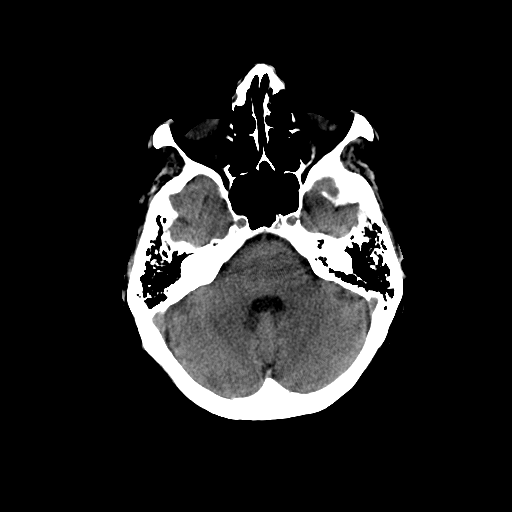
[im 9/28  brain]
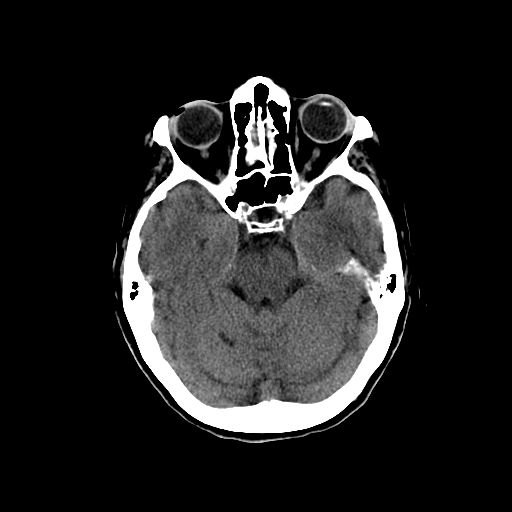
[im 9/28  bone]
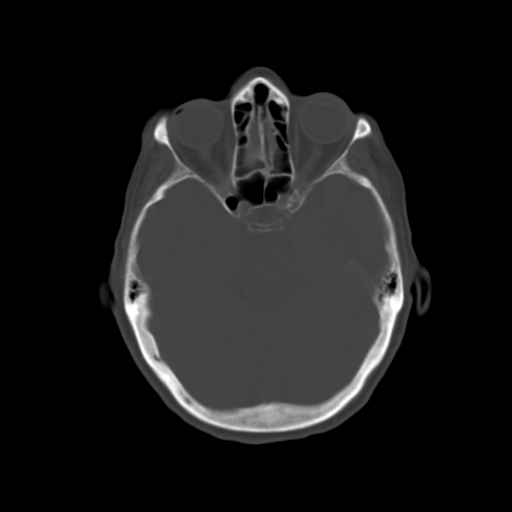
[im 10/28  brain]
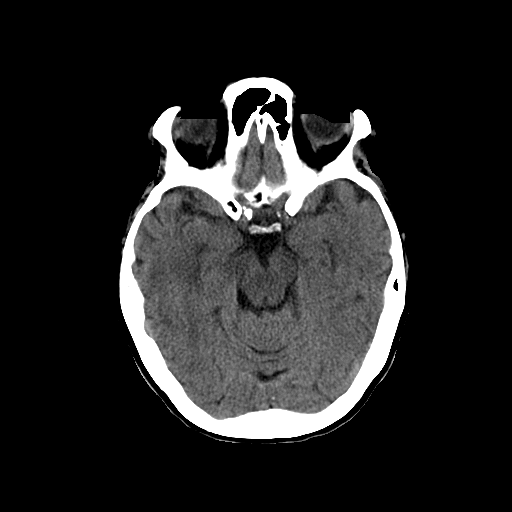
[im 12/28  brain]
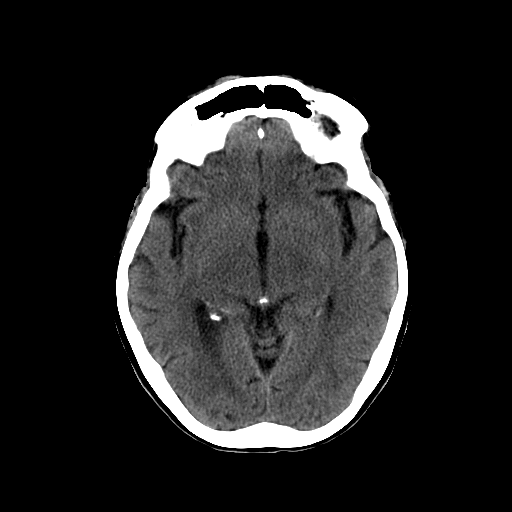
[im 14/28  brain]
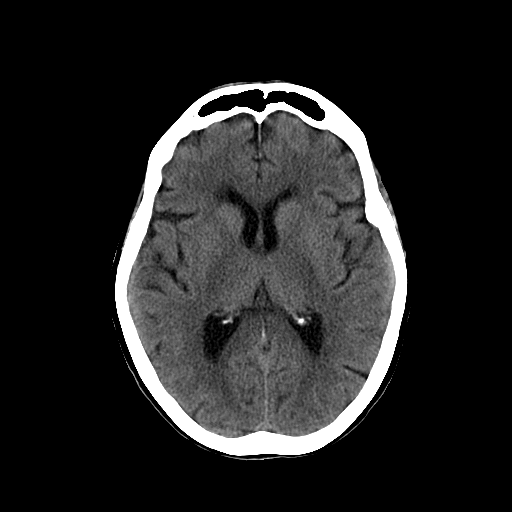
[im 15/28  brain]
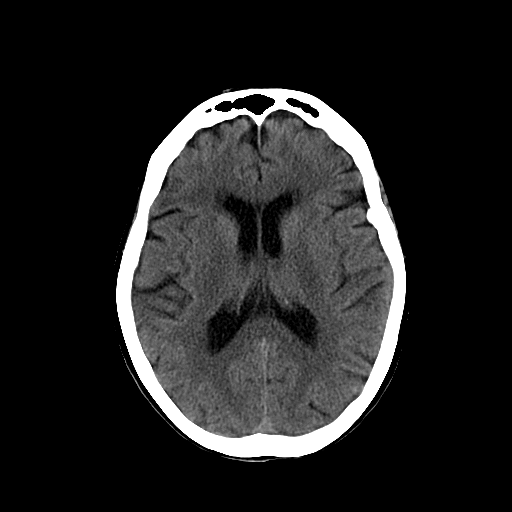
[im 15/28  bone]
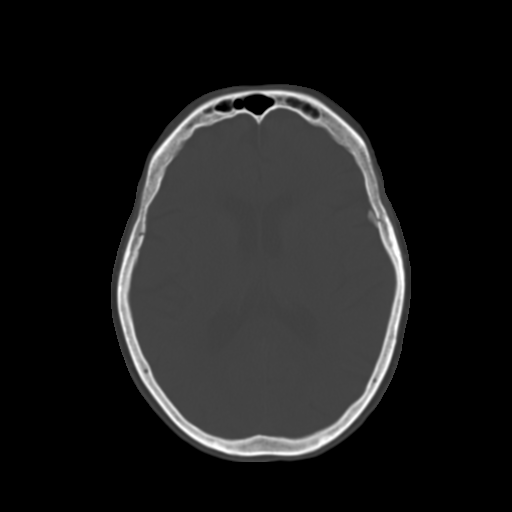
[im 17/28  brain]
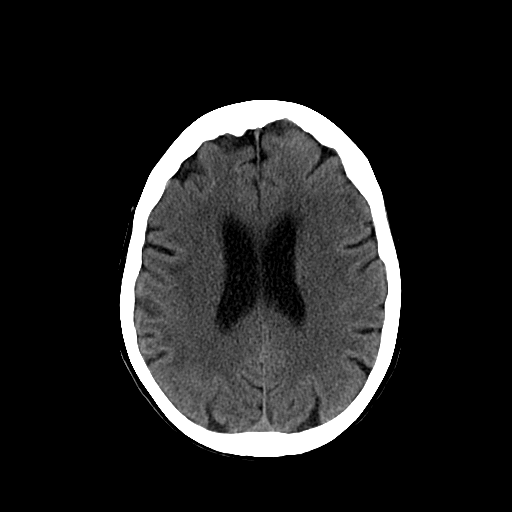
[im 19/28  brain]
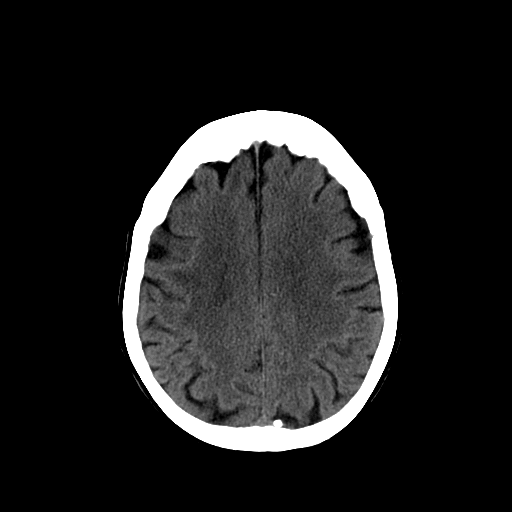
[im 20/28  brain]
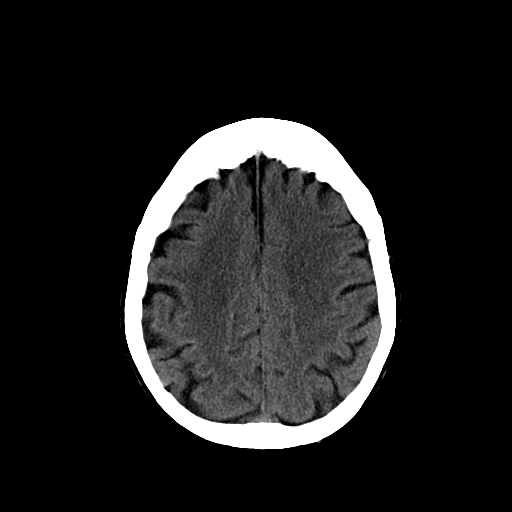
[im 22/28  brain]
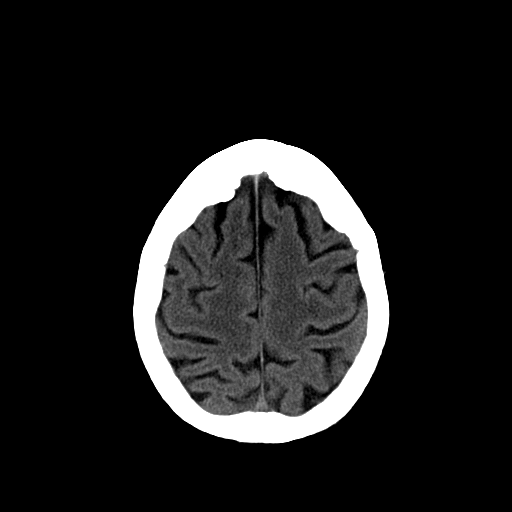
[im 22/28  bone]
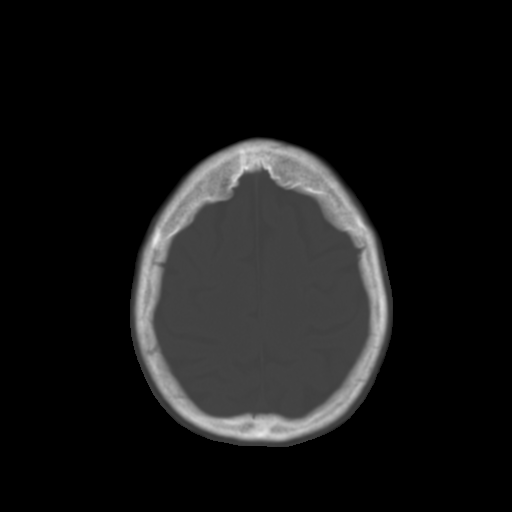
[im 23/28  brain]
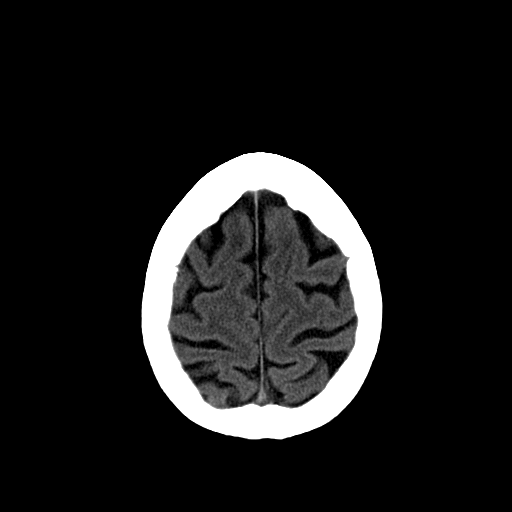
[im 25/28  brain]
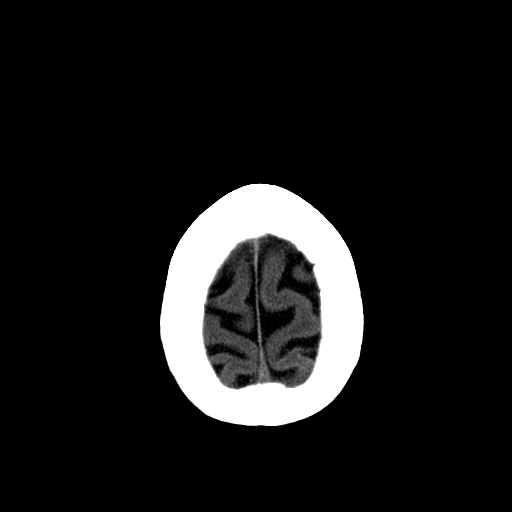
[im 27/28  brain]
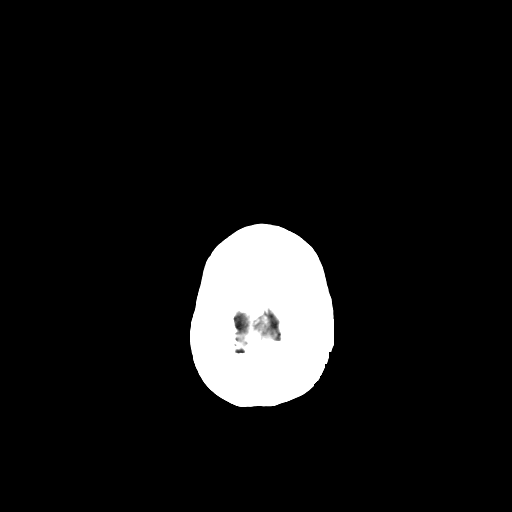

[16 of 28 positions shown; findings below may reference images not displayed]

FINDINGS: Mild chronic small vessel disease changes noted in the
deep white matter.  Again seen is a dural calcification versus
meningioma in the left temporal region, unchanged. No acute
intracranial abnormality.  Specifically, no hemorrhage,
hydrocephalus, mass lesion, acute infarction, or significant
intracranial injury.  No acute calvarial abnormality. No calvarial
fracture identified.

Visualized paranasal sinuses and mastoids clear.  Orbital soft
tissues unremarkable.
IMPRESSION: No acute intracranial abnormality.

Mild chronic small vessel disease changes, stable.

## 2009-09-01 ENCOUNTER — Encounter (INDEPENDENT_AMBULATORY_CARE_PROVIDER_SITE_OTHER): Payer: Self-pay | Admitting: *Deleted

## 2009-09-12 ENCOUNTER — Ambulatory Visit: Payer: Self-pay | Admitting: Family Medicine

## 2009-09-17 ENCOUNTER — Ambulatory Visit: Payer: Self-pay | Admitting: Family Medicine

## 2009-10-01 ENCOUNTER — Encounter: Admission: RE | Admit: 2009-10-01 | Discharge: 2009-10-01 | Payer: Self-pay | Admitting: Neurological Surgery

## 2009-11-18 ENCOUNTER — Ambulatory Visit: Payer: Self-pay | Admitting: Family Medicine

## 2009-12-18 ENCOUNTER — Encounter (INDEPENDENT_AMBULATORY_CARE_PROVIDER_SITE_OTHER): Payer: Self-pay | Admitting: *Deleted

## 2010-01-22 ENCOUNTER — Telehealth: Payer: Self-pay | Admitting: Family Medicine

## 2010-01-30 ENCOUNTER — Ambulatory Visit: Payer: Self-pay | Admitting: Cardiology

## 2010-02-11 ENCOUNTER — Ambulatory Visit: Payer: Self-pay | Admitting: Family Medicine

## 2010-02-11 LAB — CONVERTED CEMR LAB
BUN: 15 mg/dL (ref 6–23)
Basophils Absolute: 0 10*3/uL (ref 0.0–0.1)
Cholesterol: 181 mg/dL (ref 0–200)
Creatinine,U: 78.6 mg/dL
Eosinophils Absolute: 0.2 10*3/uL (ref 0.0–0.7)
GFR calc non Af Amer: 63.12 mL/min (ref >60)
Glucose, Bld: 96 mg/dL (ref 70–99)
HCT: 29.8 % — ABNORMAL LOW (ref 36.0–46.0)
Lymphs Abs: 1.2 10*3/uL (ref 0.7–4.0)
MCV: 73.7 fL — ABNORMAL LOW (ref 78.0–100.0)
Microalb, Ur: 0.4 mg/dL (ref 0.0–1.9)
Monocytes Absolute: 0.6 10*3/uL (ref 0.1–1.0)
Platelets: 247 10*3/uL (ref 150.0–400.0)
Potassium: 4.1 meq/L (ref 3.5–5.1)
RDW: 18.6 % — ABNORMAL HIGH (ref 11.5–14.6)
TSH: 1.62 microintl units/mL (ref 0.35–5.50)
Total Bilirubin: 0.5 mg/dL (ref 0.3–1.2)
VLDL: 14.8 mg/dL (ref 0.0–40.0)
Vitamin B-12: 348 pg/mL (ref 211–911)

## 2010-02-16 ENCOUNTER — Ambulatory Visit: Payer: Self-pay | Admitting: Family Medicine

## 2010-02-24 ENCOUNTER — Telehealth (INDEPENDENT_AMBULATORY_CARE_PROVIDER_SITE_OTHER): Payer: Self-pay | Admitting: *Deleted

## 2010-06-01 ENCOUNTER — Telehealth: Payer: Self-pay | Admitting: Family Medicine

## 2010-06-04 ENCOUNTER — Telehealth: Payer: Self-pay | Admitting: Family Medicine

## 2010-06-04 ENCOUNTER — Ambulatory Visit: Payer: Self-pay | Admitting: Family Medicine

## 2010-06-04 LAB — CONVERTED CEMR LAB: Bilirubin Urine: NEGATIVE

## 2010-06-05 ENCOUNTER — Encounter: Payer: Self-pay | Admitting: Family Medicine

## 2010-06-28 ENCOUNTER — Emergency Department (HOSPITAL_COMMUNITY): Admission: EM | Admit: 2010-06-28 | Discharge: 2010-06-28 | Payer: Self-pay | Admitting: Emergency Medicine

## 2010-07-07 ENCOUNTER — Ambulatory Visit: Payer: Self-pay | Admitting: Family Medicine

## 2010-07-07 DIAGNOSIS — K449 Diaphragmatic hernia without obstruction or gangrene: Secondary | ICD-10-CM | POA: Insufficient documentation

## 2010-07-09 ENCOUNTER — Encounter (INDEPENDENT_AMBULATORY_CARE_PROVIDER_SITE_OTHER): Payer: Self-pay | Admitting: *Deleted

## 2010-08-05 ENCOUNTER — Ambulatory Visit: Payer: Self-pay | Admitting: Cardiology

## 2010-08-28 ENCOUNTER — Ambulatory Visit: Payer: Self-pay | Admitting: Internal Medicine

## 2010-08-28 DIAGNOSIS — N3000 Acute cystitis without hematuria: Secondary | ICD-10-CM | POA: Insufficient documentation

## 2010-08-28 LAB — CONVERTED CEMR LAB
Bilirubin Urine: NEGATIVE
Ketones, urine, test strip: NEGATIVE
Nitrite: NEGATIVE
Specific Gravity, Urine: >=1.03
pH: 7.5

## 2010-10-09 ENCOUNTER — Ambulatory Visit: Payer: Self-pay | Admitting: Family Medicine

## 2010-10-14 ENCOUNTER — Encounter: Payer: Self-pay | Admitting: Family Medicine

## 2010-11-10 ENCOUNTER — Encounter: Payer: Self-pay | Admitting: Family Medicine

## 2010-12-03 ENCOUNTER — Telehealth: Payer: Self-pay | Admitting: Family Medicine

## 2011-01-03 LAB — CONVERTED CEMR LAB: INR: 2.3

## 2011-01-05 NOTE — Assessment & Plan Note (Signed)
Summary: F6M/DM  Medications Added ZANTAC 150 MG TABS (RANITIDINE HCL) as needed      Allergies Added:   Visit Type:  6 m follow up Primary Provider:  Dr. Para March  CC:  shortness of breath.  History of Present Illness: Cindy [name] is a pleasant female who has a history of paroxysmal atrial fibrillation and hypertrophic obstructive cardiomyopathy physiology. Myoview performed in November of 2007 showed an ejection fraction of 70%, normal perfusion and mild soft tissue attenuation. Her last echocardiogram was performed in June of 2010. This revealed hyperdynamic LV function with near cavity obliteration. Peak gradent through LVOT/AV is 108 mm Hg. The ejection fraction was 75% and there was mild LVH.There was dynamic obstruction at rest at an indeterminate location, with mid-cavity obliteration, a peak velocity of 402cm/sec, and a peak gradient of 65mm Hg. Doppler parameters are consistent with abnormal left ventricular relaxation (grade 1 diastolic dysfunction). There was mild aortic insufficiency.  I last saw her in Feb 2011. Coumadin dced previously because of recurrent falls. Since then she has some fatigue but denies dyspnea or syncope. She was seen in the emergency room in July with complaints of indigestion. Her chest pain lasted for roughly 14 hours. A d-dimer was mildly elevated but a CT showed no pulmonary embolus. Enzymes were negative. It was felt she had a hiatal hernia. She has had no further chest pain since then and denies exertional chest pain.  Current Medications (verified): 1)  Lisinopril 20 Mg Tabs (Lisinopril) .... Take 1 Tablet By Mouth Once A Day 2)  Levothyroxine Sodium 25 Mcg Tabs (Levothyroxine Sodium) .Marland Kitchen.. 1 Tab By Mouth Once Daily 3)  Allegra 180 Mg  Tabs (Fexofenadine Hcl) .... One Tab By Mouth Once Daily// Takes Only As Needed 4)  Cardizem Cd 180 Mg  Cp24 (Diltiazem Hcl Coated Beads) .... One Tab By Mouth Once Daily 5)  Aspirin Ec 325 Mg Tbec (Aspirin) .... Take One  Tablet By Mouth Daily 6)  Hydrocodone-Acetaminophen 5-500 Mg Tabs (Hydrocodone-Acetaminophen) .... Take 1/2 By Mouth Every 8 Hours As Needed 7)  Fish Oil 1000 Mg Caps (Omega-3 Fatty Acids) .... Take One By Mouth Two Times A Day 8)  Ocuvite  Tabs (Multiple Vitamins-Minerals) .Marland Kitchen.. 1 Tab By Mouth Once Daily 9)  Allegra 180 Mg Tabs (Fexofenadine Hcl) .Marland Kitchen.. 1 Tab By Mouth Once Daily 10)  Hydrochlorothiazide 25 Mg Tabs (Hydrochlorothiazide) .... Take One Tablet By Mouth Daily. 11)  Zantac 150 Mg Tabs (Ranitidine Hcl) .... As Needed  Allergies (verified): 1)  ! * Avapro 2)  ! * Micardis Hct 3)  ! * Altace 4)  Levoxyl (Levothyroxine Sodium)  Past History:  Past Medical History: HYPERCHOLESTEROLEMIA/ TRIG (219/497) (ICD-272.0) HYPERTENSION (ICD-401.9) ATRIAL FIBRILLATION (ICD-427.31) Hypertrophic cardiomyopathy ANEMIA, IRON DEFICIENCY (ICD-280.9) HYPOTHYROIDISM (ICD-244.9) GOITER, MULTINODULAR (ICD-241.1) DEGENERATIVE JOINT DISEASE, BACK (ICD-715.98) URINARY INCONTINENCE (ICD-788.30) OSTEOPOROSIS (ICD-733.00) DIVERTICULOSIS, COLON (ICD-562.10) ANEMIA (ICD-285.9)   Multiple facial lacerations.   C2 lateral mass fracture.  Right patellar fracture.  Right hand laceration.   Social History: Reviewed history from 05/09/2009 and no changes required. 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 Tobacco Use - No.   Review of Systems       no fevers or chills, productive cough, hemoptysis, dysphasia, odynophagia, melena, hematochezia, dysuria, hematuria, rash, seizure activity, orthopnea, PND, pedal edema, claudication. Remaining systems are negative.   Vital Signs:  Patient profile:  77 year old female Height:      50 inches Weight:      129.50 pounds BMI:     36.55 Pulse rate:   63 / minute BP sitting:   134 / 80  Vitals Entered By: Caralee Ates  CMA (August 05, 2010 11:52 AM)  Physical Exam  General:  Well-developed well-nourished in no acute distress.  Skin is warm and dry.  HEENT is normal.  Neck is supple. No thyromegaly.  Chest is clear to auscultation with normal expansion.  Cardiovascular exam is regular rate and rhythm.  Abdominal exam nontender or distended. No masses palpated. Extremities show no edema. neuro grossly intact    EKG  Procedure date:  08/05/2010  Findings:      Ectopic atrial rhythm at a rate of 63. Left anterior fasicular block. Prior septal infarct. Nonspecific ST changes.  Impression & Recommendations:  Problem # 1:  ATRIAL FIBRILLATION (ICD-427.31) Patient remains in sinus rhythm. Continue Cardizem. Continue aspirin. Not on Coumadin secondary to history of fall. Her updated medication list for this problem includes:    Aspirin Ec 325 Mg Tbec (Aspirin) .Marland Kitchen... Take one tablet by mouth daily  Problem # 2:  HYPERTROPHIC CARDIOMYOPATHY (ICD-425.1) Continue Cardizem. Symptoms stable at present. Given her age would like to be conservative if possible. Her updated medication list for this problem includes:    Lisinopril 20 Mg Tabs (Lisinopril) .Marland Kitchen... Take 1 tablet by mouth once a day    Cardizem Cd 180 Mg Cp24 (Diltiazem hcl coated beads) ..... One tab by mouth once daily    Aspirin Ec 325 Mg Tbec (Aspirin) .Marland Kitchen... Take one tablet by mouth daily    Hydrochlorothiazide 25 Mg Tabs (Hydrochlorothiazide) .Marland Kitchen... Take one tablet by mouth daily.  Problem # 3:  HYPERTENSION (ICD-401.9) Blood pressure controlled on present medications. Will continue. Her updated medication list for this problem includes:    Lisinopril 20 Mg Tabs (Lisinopril) .Marland Kitchen... Take 1 tablet by mouth once a day    Cardizem Cd 180 Mg Cp24 (Diltiazem hcl coated beads) ..... One tab by mouth once daily    Aspirin Ec 325 Mg Tbec (Aspirin) .Marland Kitchen... Take one tablet by mouth daily    Hydrochlorothiazide 25 Mg Tabs (Hydrochlorothiazide) .Marland Kitchen... Take  one tablet by mouth daily.  Problem # 4:  HYPERCHOLESTEROLEMIA/ TRIG (219/497) (ICD-272.0) Management per primary care.  Problem # 5:  HYPOTHYROIDISM (ICD-244.9)  Her updated medication list for this problem includes:    Levothyroxine Sodium 25 Mcg Tabs (Levothyroxine sodium) .Marland Kitchen... 1 tab by mouth once daily  Problem # 6:  CHEST PAIN (ICD-786.50) Recent episode of atypical chest pain. No further symptoms. Will not pursue further cardiac workup unless symptoms return. Her updated medication list for this problem includes:    Lisinopril 20 Mg Tabs (Lisinopril) .Marland Kitchen... Take 1 tablet by mouth once a day    Cardizem Cd 180 Mg Cp24 (Diltiazem hcl coated beads) ..... One tab by mouth once daily    Aspirin Ec 325 Mg Tbec (Aspirin) .Marland Kitchen... Take one tablet by mouth daily  Patient Instructions: 1)  Your physician recommends that you schedule a follow-up appointment in: 6 MONTHS

## 2011-01-05 NOTE — Progress Notes (Signed)
Summary: needs written script for vicodin  Phone Note Refill Request Call back at Home Phone [phone] Message from:  Patient  Refills Requested: Medication #1:  HYDROCODONE-ACETAMINOPHEN 5-500 MG TABS Take 1/2 by mouth every 8 hours as needed Pt is asking for wrtten script to mail to [facility]  Please call when ready.  Initial call taken by: Lowella Petties CMA,  June 01, 2010 12:56 PM  Follow-up for Phone Call        [facility] advising pt ok to pick up. Follow-up by: Lowella Petties CMA,  June 01, 2010 2:16 PM    Prescriptions: HYDROCODONE-ACETAMINOPHEN 5-500 MG TABS (HYDROCODONE-ACETAMINOPHEN) Take 1/2 by mouth every 8 hours as needed  #90 x 2   Entered and Authorized by:   Shaune Leeks MD   Signed by:   Shaune Leeks MD on 06/01/2010   Method used:   Print then Give to Patient   RxID:   [id number]

## 2011-01-05 NOTE — Assessment & Plan Note (Signed)
Summary: CPX/[facility]   Vital Signs:  Patient profile:   77 year old female Weight:      134 pounds Temp:     98.2 degrees F oral Pulse rate:   76 / minute Pulse rhythm:   regular BP sitting:   128 / 60  (left arm) Cuff size:   regular  Vitals Entered By: Sydell Axon LPN (February 16, 2010 1:54 PM) CC: 30 Minute checkup, had a colonoscopy 09/01, sees Dr. Ambrose Mantle for her GYN care   History of Present Illness: Pt here for followup. She sees Dr Ambrose Mantle for Gyn. She saw Dr Cleophas Dunker recently for right shoulder and right knee pain....was given injections in both. She was told she had arthritis and could use a surgery which she declined. She hurts but feels reasonably well.  Preventive Screening-Counseling & Management  Alcohol-Tobacco     Alcohol drinks/day: 0     Smoking Status: never     Year Quit: 1975     Pack years: 4     Passive Smoke Exposure: no  Caffeine-Diet-Exercise     Caffeine use/day: 1     Does Patient Exercise: no  Problems Prior to Update: 1)  Sprain&strain Other Specified Sites Knee&leg  (ICD-844.8) 2)  Uti  (ICD-599.0) 3)  Hypertrophic Cardiomyopathy  (ICD-425.1) 4)  Hypercholesterolemia/ Trig (219/497)  (ICD-272.0) 5)  Hypertension  (ICD-401.9) 6)  Atrial Fibrillation  (ICD-427.31) 7)  Cardiomyopathy  (ICD-425.4) 8)  Wrist Pain, Right  (ICD-719.43) 9)  Paresthesia  (ICD-782.0) 10)  Anemia, Iron Deficiency  (ICD-280.9) 11)  Screening For Malignannt Neoplasm, Site Nec  (ICD-V76.49) 12)  Backache Nos  (ICD-724.5) 13)  Hyperglycemia, 105  (ICD-790.29) 14)  Hypothyroidism  (ICD-244.9) 15)  Goiter, Multinodular  (ICD-241.1) 16)  Degenerative Joint Disease, Back  (ICD-715.98) 17)  Urinary Incontinence  (ICD-788.30) 18)  Osteoporosis  (ICD-733.00) 19)  Diverticulosis, Colon  (ICD-562.10) 20)  Anticoagulation Therapy  (ICD-V58.61) 21)  Encounter For Therapeutic Drug Monitoring  (ICD-V58.83) 22)  Anemia  (ICD-285.9) 23)  Allergic Rhinitis  (ICD-477.9) 24)   Contusion, Left Knee  (ICD-924.11) 25)  Need Prophylactic Vaccination&inoculation Flu  (ICD-V04.81)  Allergies: 1)  ! * Avapro 2)  ! * Micardis Hct 3)  ! * Altace 4)  Levoxyl (Levothyroxine Sodium)  Past History:  Past Medical History: Last updated: 01/30/2010 Current Problems:  HYPERCHOLESTEROLEMIA/ TRIG (219/497) (ICD-272.0) HYPERTENSION (ICD-401.9) ATRIAL FIBRILLATION (ICD-427.31) Hypertrophic cardiomyopathy ANEMIA, IRON DEFICIENCY (ICD-280.9) HYPOTHYROIDISM (ICD-244.9) GOITER, MULTINODULAR (ICD-241.1) DEGENERATIVE JOINT DISEASE, BACK (ICD-715.98) URINARY INCONTINENCE (ICD-788.30) OSTEOPOROSIS (ICD-733.00) DIVERTICULOSIS, COLON (ICD-562.10) ANEMIA (ICD-285.9)   Multiple facial lacerations.   C2 lateral mass fracture.  Right patellar fracture.  Right hand laceration.   Past Surgical History: Last updated: 05/08/2009 Tonsillectomy Appendectomy 1941 Breast Reduction 1981 Bladder Tack early 1990s Collagen implant 1998 DEXA - osteoporosis 12/99 EGD/Colonosc,  Barrett's esophagus f/u 2003, H. H., divertics 09/01 D & C , endometrial polyps removed, Path all benign 09/07/00 SBFT negative  10/01 ECHO, EF wnl,  mild A. stenosis, - A. S. mild MR, mild TR 03/05/02 Cataract OS 04/03 Tear duct surg  (Dr. Sheffield Slider) 07/29/03 Adenosine cardiolite wnl EF 65%, ? Neoplasm 09/11/03 ECHO EF 70%, LVH, ? diast dysfuncion 09/11/03 Cystourecthroscopy Earlene Plater) 09/17/03 Thyroid U/S, MNG, no dominant masses 10/14/03 Right rotator cuff surg. (Dr Darrelyn Hillock)  09/07/05 [facility] AFib, CT chest negative, CVTS/ P. E. 04/23- 03/29/06   Open reduction and internal fixation.        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 Tobacco Use - No.   Risk Factors: Alcohol Use: 0 (02/16/2010) Caffeine Use: 1 (02/16/2010) Exercise: no (02/16/2010)  Risk Factors: Smoking Status: never (02/16/2010) Passive Smoke Exposure: no (02/16/2010)  Review of Systems General:  Complains of fatigue; denies chills, fever, sweats, weakness, and weight loss; chronically. Eyes:  Complains of blurring; denies discharge and eye pain. ENT:  Complains of decreased hearing; denies ear discharge, earache, and ringing in ears. CV:  Complains of fatigue; denies chest pain or discomfort, difficulty breathing while lying down, fainting, palpitations, and shortness of breath with exertion. Resp:  Complains of shortness of breath; denies cough and wheezing. GI:  Denies abdominal pain, bloody stools, change in bowel habits, constipation, dark tarry stools, diarrhea, indigestion, loss of appetite, nausea, vomiting, vomiting blood, and yellowish skin color. GU:  Denies discharge, dysuria, nocturia, and urinary frequency. MS:  Complains of joint pain; denies muscle aches, cramps, and stiffness; knee and shoulder.. Derm:  Denies dryness, itching, and rash. Neuro:  Denies numbness, poor balance, tingling, and tremors.  Physical Exam  General:  Well-developed frail in no acute distress. Skin is warm and dry.. Head:  Normocephalic and atraumatic without obvious abnormalities. No apparent alopecia or balding. Eyes:  Conjunctiva clear bilaterally.  Ears:  External ear exam shows no significant lesions or deformities.  Otoscopic examination reveals clear canals, tympanic membranes are intact bilaterally without bulging, retraction, inflammation or discharge. Hearing is grossly normal bilaterally. Nose:  External nasal examination shows no deformity or inflammation. Nasal mucosa are pink and moist without lesions or  exudates. Mouth:  Oral mucosa and oropharynx without lesions or exudates.  Teeth in mild  repair. Neck:  No deformities, masses, or tenderness noted. Chest Wall:  No deformities, masses, or tenderness noted. Lungs:  Normal respiratory effort, chest expands symmetrically. Lungs are clear to auscultation, no crackles or wheezes. Heart:  regular rate and rhythm with 2/6 systolic murmur at the left sternal border. Increases with Valsalva. Abdomen:  soft and nontender. No masses palpated. Msk:  No deformity or scoliosis noted of thoracic or lumbar spine.   Pulses:  R and L carotid,radial,femoral,dorsalis pedis and posterior tibial pulses are full and equal bilaterally Extremities:  right leg out of mobilizer, minimally decreased ROM of right knee. No edema. Neurologic:  grossly intact. Skin:  Intact without suspicious lesions or rashes. Many sun changes. Cervical Nodes:  No lymphadenopathy noted Inguinal Nodes:  No significant adenopathy Psych:  Cognition and judgment appear intact. Alert and cooperative with normal attention span and concentration. No apparent delusions, illusions, hallucinations   Impression & Recommendations:  Problem # 1:  SPRAIN&STRAIN OTHER SPECIFIED SITES KNEE&LEG (ICD-844.8) Assessment Unchanged Stablwe, improved with injection, now back to baseline.  Problem # 2:  HYPERCHOLESTEROLEMIA/ TRIG (219/497) (ICD-272.0) Assessment: Improved  Great nos.  Labs Reviewed: SGOT: 18 (02/11/2010)   SGPT: 14 (02/11/2010)   HDL:77.50 (02/11/2010), 56.9 (01/13/2009)  LDL:89 (02/11/2010), 120 (74/25/9563)  Chol:181 (02/11/2010), 196 (01/13/2009)  Trig:74.0 (02/11/2010), 97 (01/13/2009)  Problem # 3:  HYPERTENSION (ICD-401.9) Assessment: Improved Good control. Her updated medication list for this problem includes:    Lisinopril 20 Mg Tabs (Lisinopril) .Marland Kitchen... Take 1  tablet by mouth once a day    Cardizem Cd 180 Mg Cp24 (Diltiazem hcl coated beads) ..... One tab by mouth once  daily    Hydrochlorothiazide 25 Mg Tabs (Hydrochlorothiazide) .Marland Kitchen... Take one tablet by mouth daily.  BP today: 128/60 Prior BP: 135/45 (01/30/2010)  Labs Reviewed: K+: 4.1 (02/11/2010) Creat: : 0.9 (02/11/2010)   Chol: 181 (02/11/2010)   HDL: 77.50 (02/11/2010)   LDL: 89 (02/11/2010)   TG: 74.0 (02/11/2010)  Problem # 4:  ATRIAL FIBRILLATION (ICD-427.31) Assessment: Unchanged Stable. Her updated medication list for this problem includes:    Cardizem Cd 180 Mg Cp24 (Diltiazem hcl coated beads) ..... One tab by mouth once daily    Aspirin Ec 325 Mg Tbec (Aspirin) .Marland Kitchen... Take one tablet by mouth daily  Problem # 5:  ANEMIA, IRON DEFICIENCY (ICD-280.9) Assessment: Unchanged Declines Hematology referral. Will follow. Just got back on Geritol.  Problem # 6:  HYPERGLYCEMIA, 105 (ICD-790.29) Assessment: Unchanged Stable, kidney function nml.  Problem # 7:  HYPOTHYROIDISM (ICD-244.9) Assessment: Unchanged Stable. Her updated medication list for this problem includes:    Levothyroxine Sodium 25 Mcg Tabs (Levothyroxine sodium) .Marland Kitchen... 1 tab by mouth once daily  Complete Medication List: 1)  Lisinopril 20 Mg Tabs (Lisinopril) .... Take 1 tablet by mouth once a day 2)  Levothyroxine Sodium 25 Mcg Tabs (Levothyroxine sodium) .Marland Kitchen.. 1 tab by mouth once daily 3)  Allegra 180 Mg Tabs (Fexofenadine hcl) .... One tab by mouth once daily// takes only as needed 4)  Cardizem Cd 180 Mg Cp24 (Diltiazem hcl coated beads) .... One tab by mouth once daily 5)  Aspirin Ec 325 Mg Tbec (Aspirin) .... Take one tablet by mouth daily 6)  Hydrocodone-acetaminophen 5-500 Mg Tabs (Hydrocodone-acetaminophen) .... Take 1/2 by mouth every 8 hours as needed 7)  Fish Oil 1000 Mg Caps (Omega-3 fatty acids) .... Take one by mouth two times a day 8)  Ocuvite Tabs (Multiple vitamins-minerals) .Marland Kitchen.. 1 tab by mouth once daily 9)  Allegra 180 Mg Tabs (Fexofenadine hcl) .Marland Kitchen.. 1 tab by mouth once daily 10)  Hydrochlorothiazide 25 Mg  Tabs (Hydrochlorothiazide) .... Take one tablet by mouth daily.  Other Orders: Gastroenterology Referral (GI)  Patient Instructions: 1)  Will need GI referral for colonoscopy after 9/11.   Current Allergies (reviewed today): ! * AVAPRO ! * MICARDIS HCT ! * ALTACE LEVOXYL (LEVOTHYROXINE SODIUM)

## 2011-01-05 NOTE — Assessment & Plan Note (Signed)
Summary: SCHALLER FLU SHOT/[facility]   Nurse Visit   Allergies: 1)  ! * Avapro 2)  ! * Micardis Hct 3)  ! * Altace 4)  Levoxyl (Levothyroxine Sodium)  Orders Added: 1)  Flu Vaccine 19yrs + MEDICARE PATIENTS [Q2039] 2)  Administration Flu vaccine - MCR [G0008]    Flu Vaccine Consent Questions     Do you have a history of severe allergic reactions to this vaccine? no    Any prior history of allergic reactions to egg and/or gelatin? no    Do you have a sensitivity to the preservative Thimersol? no    Do you have a past history of Guillan-Barre Syndrome? no    Do you currently have an acute febrile illness? no    Have you ever had a severe reaction to latex? no    Vaccine information given and explained to patient? yes    Are you currently pregnant? no    Lot Number:[id number]   Exp Date:06/05/2011   Site Given  Left Deltoid IM

## 2011-01-05 NOTE — Progress Notes (Signed)
Summary: Follow up by GI per Dr. Hetty Ely  ---- Converted from flag ---- ---- 02/22/2010 9:53 AM, Shaune Leeks MD wrote: No, needs colonoscopy after 9/11. GI will probably contact her through their reminder file.  ---- 02/20/2010 2:43 PM, Daine Gip wrote: Dr. Hetty Ely, Do you want Cindy [name] to be seen by a GI physician.Marland KitchenReferral incomplete.cdavis ------------------------------

## 2011-01-05 NOTE — Progress Notes (Signed)
Summary: Alternative for TriamtereneHCTZ  Phone Note Refill Request Call back at [phone] Message from:  [facility] on January 22, 2010 4:53 PM  Received faxed request.  Triamterene/HCTZ unavailable from manufacture.  Form in your IN box please advise   Method Requested: Fax to Local Pharmacy Initial call taken by: Linde Gillis CMA Duncan Dull),  January 22, 2010 4:55 PM  Follow-up for Phone Call        Form completed by Dr. Hetty Ely and faxed back by Jacki Cones on 01/22/2010. Follow-up by: Linde Gillis CMA Duncan Dull),  January 23, 2010 9:39 AM    New/Updated Medications: TRIAMTERENE-HCTZ 37.5-25 MG TABS (TRIAMTERENE-HCTZ) one tab by mouth in AM

## 2011-01-05 NOTE — Assessment & Plan Note (Signed)
Summary: ? UTI   Vital Signs:  Patient profile:   77 year old female Weight:      133.25 pounds BMI:     37.61 Temp:     98.2 degrees F oral Pulse rate:   64 / minute Pulse rhythm:   regular BP sitting:   140 / 78  (left arm) Cuff size:   regular  Vitals Entered By: Sydell Axon LPN (June 04, 2010 3:25 PM) CC: Burning with urination   History of Present Illness: Pt here for difficulty going to BR yesterday to urinate. She took an extra diuretic last night and had to get up in the middle of the night with reasonable production of urine but then started burning today after getting up. She has not had fever or chills, no back pain and no abd pain, just burning and pain with urination. She otherwise feels she is baseline with no other acute problems.  Problems Prior to Update: 1)  Sprain&strain Other Specified Sites Knee&leg  (ICD-844.8) 2)  Uti  (ICD-599.0) 3)  Hypertrophic Cardiomyopathy  (ICD-425.1) 4)  Hypercholesterolemia/ Trig (219/497)  (ICD-272.0) 5)  Hypertension  (ICD-401.9) 6)  Atrial Fibrillation  (ICD-427.31) 7)  Cardiomyopathy  (ICD-425.4) 8)  Wrist Pain, Right  (ICD-719.43) 9)  Paresthesia  (ICD-782.0) 10)  Anemia, Iron Deficiency  (ICD-280.9) 11)  Screening For Malignannt Neoplasm, Site Nec  (ICD-V76.49) 12)  Backache Nos  (ICD-724.5) 13)  Hyperglycemia, 105  (ICD-790.29) 14)  Hypothyroidism  (ICD-244.9) 15)  Goiter, Multinodular  (ICD-241.1) 16)  Degenerative Joint Disease, Back  (ICD-715.98) 17)  Urinary Incontinence  (ICD-788.30) 18)  Osteoporosis  (ICD-733.00) 19)  Diverticulosis, Colon  (ICD-562.10) 20)  Anticoagulation Therapy  (ICD-V58.61) 21)  Encounter For Therapeutic Drug Monitoring  (ICD-V58.83) 22)  Anemia  (ICD-285.9) 23)  Allergic Rhinitis  (ICD-477.9) 24)  Contusion, Left Knee  (ICD-924.11) 25)  Need Prophylactic Vaccination&inoculation Flu  (ICD-V04.81)  Medications Prior to Update: 1)  Lisinopril 20 Mg Tabs (Lisinopril) .... Take 1 Tablet  By Mouth Once A Day 2)  Levothyroxine Sodium 25 Mcg Tabs (Levothyroxine Sodium) .Marland Kitchen.. 1 Tab By Mouth Once Daily 3)  Allegra 180 Mg  Tabs (Fexofenadine Hcl) .... One Tab By Mouth Once Daily// Takes Only As Needed 4)  Cardizem Cd 180 Mg  Cp24 (Diltiazem Hcl Coated Beads) .... One Tab By Mouth Once Daily 5)  Aspirin Ec 325 Mg Tbec (Aspirin) .... Take One Tablet By Mouth Daily 6)  Hydrocodone-Acetaminophen 5-500 Mg Tabs (Hydrocodone-Acetaminophen) .... Take 1/2 By Mouth Every 8 Hours As Needed 7)  Fish Oil 1000 Mg Caps (Omega-3 Fatty Acids) .... Take One By Mouth Two Times A Day 8)  Ocuvite  Tabs (Multiple Vitamins-Minerals) .Marland Kitchen.. 1 Tab By Mouth Once Daily 9)  Allegra 180 Mg Tabs (Fexofenadine Hcl) .Marland Kitchen.. 1 Tab By Mouth Once Daily 10)  Hydrochlorothiazide 25 Mg Tabs (Hydrochlorothiazide) .... Take One Tablet By Mouth Daily.  Allergies: 1)  ! * Avapro 2)  ! * Micardis Hct 3)  ! * Altace 4)  Levoxyl (Levothyroxine Sodium)  Physical Exam  General:  Well-developed frail in no acute distress. Skin is warm and dry.. Head:  Normocephalic and atraumatic without obvious abnormalities. No apparent alopecia or balding. Eyes:  Conjunctiva clear bilaterally.  Ears:  External ear exam shows no significant lesions or deformities.  Otoscopic examination reveals clear canals, tympanic membranes are intact bilaterally without bulging, retraction, inflammation or discharge. Hearing is grossly normal bilaterally. Nose:  External nasal examination shows no deformity or inflammation. Nasal  mucosa are pink and moist without lesions or exudates. Mouth:  Oral mucosa and oropharynx without lesions or exudates.  Teeth in mild  repair. Neck:  No deformities, masses, or tenderness noted. Chest Wall:  No deformities, masses, or tenderness noted. Lungs:  Normal respiratory effort, chest expands symmetrically. Lungs are clear to auscultation, no crackles or wheezes. Heart:  regular rate and rhythm with 2/6 systolic murmur at  the left sternal border. Increases with Valsalva. Abdomen:  Nom suprapubic pain. Msk:  No CVAT.   Impression & Recommendations:  Problem # 1:  UTI (ICD-599.0) Assessment Comment Only  Recurrent Start Septra. Her updated medication list for this problem includes:    Septra Ds 800-160 Mg Tabs (Sulfamethoxazole-trimethoprim) ..... One tab by mouth two times a day  Encouraged to push clear liquids, get enough rest, and take acetaminophen as needed. To be seen in 10 days if no improvement, sooner if worse.  Orders: UA Dipstick W/ Micro (manual) (16109) Specimen Handling (99000) T-Culture, Urine ([id number])  Complete Medication List: 1)  Lisinopril 20 Mg Tabs (Lisinopril) .... Take 1 tablet by mouth once a day 2)  Levothyroxine Sodium 25 Mcg Tabs (Levothyroxine sodium) .Marland Kitchen.. 1 tab by mouth once daily 3)  Allegra 180 Mg Tabs (Fexofenadine hcl) .... One tab by mouth once daily// takes only as needed 4)  Cardizem Cd 180 Mg Cp24 (Diltiazem hcl coated beads) .... One tab by mouth once daily 5)  Aspirin Ec 325 Mg Tbec (Aspirin) .... Take one tablet by mouth daily 6)  Hydrocodone-acetaminophen 5-500 Mg Tabs (Hydrocodone-acetaminophen) .... Take 1/2 by mouth every 8 hours as needed 7)  Fish Oil 1000 Mg Caps (Omega-3 fatty acids) .... Take one by mouth two times a day 8)  Ocuvite Tabs (Multiple vitamins-minerals) .Marland Kitchen.. 1 tab by mouth once daily 9)  Allegra 180 Mg Tabs (Fexofenadine hcl) .Marland Kitchen.. 1 tab by mouth once daily 10)  Hydrochlorothiazide 25 Mg Tabs (Hydrochlorothiazide) .... Take one tablet by mouth daily. 11)  Septra Ds 800-160 Mg Tabs (Sulfamethoxazole-trimethoprim) .... One tab by mouth two times a day  Patient Instructions: 1)  Call or RTC if sxs don't improve. 2)  Will culture to insure coverage.. Prescriptions: SEPTRA DS 800-160 MG TABS (SULFAMETHOXAZOLE-TRIMETHOPRIM) one tab by mouth two times a day  #20 x 0   Entered and Authorized by:   Shaune Leeks MD   Signed by:    Shaune Leeks MD on 06/04/2010   Method used:   Electronically to        [facility]* (retail)       [address]       [place], [place]  [address]       Ph: [phone]       Fax: [phone]   RxID:   [id number]   Current Allergies (reviewed today): ! * AVAPRO ! * MICARDIS HCT ! * ALTACE LEVOXYL (LEVOTHYROXINE SODIUM)  Laboratory Results   Urine Tests  Date/Time Received: June 04, 2010 3:36 PM  Date/Time Reported: June 04, 2010 3:37 PM   Routine Urinalysis   Color: yellow Appearance: Cloudy Glucose: negative   (Normal Range: Negative) Bilirubin: negative   (Normal Range: Negative) Ketone: negative   (Normal Range: Negative) Spec. Gravity: 1.010   (Normal Range: 1.003-1.035) Blood: moderate   (Normal Range: Negative) pH: 6.0   (Normal Range: 5.0-8.0) Protein: trace   (Normal Range: Negative) Urobilinogen: 0.2   (Normal Range: 0-1) Nitrite: negative   (Normal Range: Negative) Leukocyte Esterace: moderate   (Normal Range:  Negative)  Urine Microscopic WBC/HPF: TNTC RBC/HPF: 0-1 Bacteria/HPF: 1+ Epithelial/HPF: Rare

## 2011-01-05 NOTE — Progress Notes (Signed)
Summary: wants to leave urine sample  Phone Note Call from Patient Call back at Home Phone [phone]   Caller: Patient Call For: Shaune Leeks MD Summary of Call: Pt has burning with urination, pressure.  She asks if she can come in and leave a urine sample, there are no appts available.  [facility] Initial call taken by: Lowella Petties CMA,  June 04, 2010 9:55 AM  Follow-up for Phone Call        I'll see her at 63. Follow-up by: Shaune Leeks MD,  June 04, 2010 10:05 AM  Additional Follow-up for Phone Call Additional follow up Details #1::        Appt made. Additional Follow-up by: Lowella Petties CMA,  June 04, 2010 10:28 AM

## 2011-01-05 NOTE — Assessment & Plan Note (Signed)
Summary: uti/alc   Vital Signs:  Patient profile:   77 year old female Weight:      131 pounds Temp:     97.9 degrees F oral Pulse rate:   62 / minute Pulse rhythm:   regular BP sitting:   160 / 70  (left arm) Cuff size:   regular  Vitals Entered By: Mervin Hack CMA Duncan Dull) (August 28, 2010 4:40 PM) CC: UTI   History of Present Illness: Started with some burning dysuria briefly 1-2 days ago Recurred severely this AM--seemed to come right up to chest seemed some better in office giving specimen  some increased frequency and some urgency No heamturia No fever Some mild suprapubic tenderness    Allergies: 1)  ! * Avapro 2)  ! * Micardis Hct 3)  ! * Altace 4)  Levoxyl (Levothyroxine Sodium)  Past History:  Past medical, surgical, family and social histories (including risk factors) reviewed for relevance to current acute and chronic problems.  Past Medical History: Reviewed history from 08/05/2010 and no changes required. HYPERCHOLESTEROLEMIA/ TRIG (219/497) (ICD-272.0) HYPERTENSION (ICD-401.9) ATRIAL FIBRILLATION (ICD-427.31) Hypertrophic cardiomyopathy ANEMIA, IRON DEFICIENCY (ICD-280.9) HYPOTHYROIDISM (ICD-244.9) GOITER, MULTINODULAR (ICD-241.1) DEGENERATIVE JOINT DISEASE, BACK (ICD-715.98) URINARY INCONTINENCE (ICD-788.30) OSTEOPOROSIS (ICD-733.00) DIVERTICULOSIS, COLON (ICD-562.10) ANEMIA (ICD-285.9)   Multiple facial lacerations.   C2 lateral mass fracture.  Right patellar fracture.  Right hand laceration.   Past Surgical History: Reviewed history from 05/08/2009 and no changes required. Tonsillectomy Appendectomy 1941 Breast Reduction 1981 Bladder Tack early 1990s Collagen implant 1998 DEXA - osteoporosis 12/99 EGD/Colonosc,  Barrett's esophagus f/u 2003, H. H., divertics 09/01 D & C , endometrial polyps removed, Path all benign 09/07/00 SBFT negative  10/01 ECHO, EF wnl,  mild A. stenosis, - A. S. mild MR, mild TR 03/05/02 Cataract OS  04/03 Tear duct surg  (Dr. Sheffield Slider) 07/29/03 Adenosine cardiolite wnl EF 65%, ? Neoplasm 09/11/03 ECHO EF 70%, LVH, ? diast dysfuncion 09/11/03 Cystourecthroscopy Earlene Plater) 09/17/03 Thyroid U/S, MNG, no dominant masses 10/14/03 Right rotator cuff surg. (Dr Darrelyn Hillock)  09/07/05 [facility] AFib, CT chest negative, CVTS/ P. E. 04/23- 03/29/06   Open reduction and internal fixation.        Family History: Reviewed history from 08/18/2007 and no changes required. Father: Died during W W II in his 21's Mother: dec  congestive heart failure, diabetes, and hypertension Siblings: Only child CV + Mother, CHF BP + Mother, GM CA + Mother,, uterine cancer - Stroke  Social History: Reviewed history from 05/09/2009 and no changes required. 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 Tobacco Use - No.   Review of Systems       chronic back pain no nausea or vomiting  Physical Exam  General:  alert and normal appearance.   Abdomen:  soft and no masses.  Mild suprapubic tenderness Msk:  No CVA tenderness   Impression & Recommendations:  Problem # 1:  ACUTE CYSTITIS (ICD-595.0) Assessment New  really does not seem to be a UTI discussed that shorter antibiotic courses may be fine  will treat with same septra---may be able to stop after 3 days  Her updated medication list for this problem includes:    Sulfamethoxazole-tmp Ds 800-160 Mg Tabs (Sulfamethoxazole-trimethoprim) .Marland Kitchen... 1 tab by mouth three times a day for bladder infection  Orders: UA Dipstick W/ Micro (manual) (19147)  Complete Medication List: 1)  Lisinopril 20 Mg  Tabs (Lisinopril) .... Take 1 tablet by mouth once a day 2)  Levothyroxine Sodium 25 Mcg Tabs (Levothyroxine sodium) .Marland Kitchen.. 1 tab by mouth once daily 3)  Allegra 180 Mg Tabs (Fexofenadine hcl) .... One tab by mouth once daily// takes only  as needed 4)  Cardizem Cd 180 Mg Cp24 (Diltiazem hcl coated beads) .... One tab by mouth once daily 5)  Aspirin Ec 325 Mg Tbec (Aspirin) .... Take one tablet by mouth daily 6)  Hydrocodone-acetaminophen 5-500 Mg Tabs (Hydrocodone-acetaminophen) .... Take 1/2 by mouth every 8 hours as needed 7)  Fish Oil 1000 Mg Caps (Omega-3 fatty acids) .... Take one by mouth two times a day 8)  Ocuvite Tabs (Multiple vitamins-minerals) .Marland Kitchen.. 1 tab by mouth once daily 9)  Allegra 180 Mg Tabs (Fexofenadine hcl) .Marland Kitchen.. 1 tab by mouth once daily 10)  Hydrochlorothiazide 25 Mg Tabs (Hydrochlorothiazide) .... Take one tablet by mouth daily. 11)  Zantac 150 Mg Tabs (Ranitidine hcl) .... As needed 12)  Sulfamethoxazole-tmp Ds 800-160 Mg Tabs (Sulfamethoxazole-trimethoprim) .Marland Kitchen.. 1 tab by mouth three times a day for bladder infection  Patient Instructions: 1)  Please start the antibiotic today. If your urinary symptoms are better after 1 or 2 doses, please take the antibioitic for only 3 days and then you can save the rest for the next similar episode Prescriptions: SULFAMETHOXAZOLE-TMP DS 800-160 MG TABS (SULFAMETHOXAZOLE-TRIMETHOPRIM) 1 tab by mouth three times a day for bladder infection  #20 x 0   Entered and Authorized by:   Cindee Salt MD   Signed by:   Cindee Salt MD on 08/28/2010   Method used:   Electronically to        [facility]* (retail)       [address]       [place], [place]  [address]       Ph: [phone]       Fax: [phone]   RxID:   [id number]   Current Allergies (reviewed today): ! * AVAPRO ! * MICARDIS HCT ! * ALTACE LEVOXYL (LEVOTHYROXINE SODIUM)  Laboratory Results   Urine Tests  Date/Time Received: August 28, 2010 4:45 PM Date/Time Reported: August 28, 2010 4:45 PM  Routine Urinalysis   Color: lt. yellow Appearance: Hazy Glucose: negative   (Normal Range: Negative) Bilirubin: negative   (Normal Range: Negative) Ketone: negative   (Normal  Range: Negative) Spec. Gravity: >=1.030   (Normal Range: 1.003-1.035) Blood: small   (Normal Range: Negative) pH: 7.5   (Normal Range: 5.0-8.0) Protein: negative   (Normal Range: Negative) Urobilinogen: 0.2   (Normal Range: 0-1) Nitrite: negative   (Normal Range: Negative) Leukocyte Esterace: trace   (Normal Range: Negative)  Urine Microscopic WBC/HPF: 0-3 RBC/HPF: occ Bacteria/HPF: rare Epithelial/HPF: rare

## 2011-01-05 NOTE — Assessment & Plan Note (Signed)
Summary: F7Y  Medications Added OCUVITE  TABS (MULTIPLE VITAMINS-MINERALS) 1 tab by mouth once daily ALLEGRA 180 MG TABS (FEXOFENADINE HCL) 1 tab by mouth once daily HYDROCHLOROTHIAZIDE 25 MG TABS (HYDROCHLOROTHIAZIDE) Take one tablet by mouth daily. COLACE 100 MG CAPS (DOCUSATE SODIUM) 1 tab by mouth two times a day        History of Present Illness: Ms. Kooi is a pleasant female who has a history of paroxysmal atrial fibrillation and hypertrophic obstructive cardiomyopathy physiology. Myoview performed in November of 2007 showed an ejection fraction of 70%, normal perfusion and mild soft tissue attenuation. Her last echocardiogram was performed in June of 2010. This revealed hyperdynamic LV function with near cavity obliteration. Peak gradent through LVOT/AV is 108 mm Hg. The ejection fraction was 75% and there was mild LVH.There was dynamic obstruction at rest at an indeterminate location, with mid-cavity obliteration, a peak velocity of 402cm/sec, and a peak gradient of 65mm Hg. Doppler parameters are consistent with abnormal left ventricular relaxation (grade 1 diastolic dysfunction). There was mild aortic insufficiency.  I last saw her in June of 2010. At that time we discontinued her Coumadin because of recurrent falls. Since then she has dyspnea on exertion which appears to be somewhat worse compared to previous. It is relieved with rest. There is no associated chest pain. There is no orthopnea, PND or pedal edema. She has not had syncope.  Current Medications (verified): 1)  Lisinopril 20 Mg Tabs (Lisinopril) .... Take 1 Tablet By Mouth Once A Day 2)  Levothyroxine Sodium 25 Mcg Tabs (Levothyroxine Sodium) .Marland Kitchen.. 1 Tab By Mouth Once Daily 3)  Allegra 180 Mg  Tabs (Fexofenadine Hcl) .... One Tab By Mouth Once Daily// Takes Only As Needed 4)  Cardizem Cd 180 Mg  Cp24 (Diltiazem Hcl Coated Beads) .... One Tab By Mouth Once Daily 5)  Aspirin Ec 325 Mg Tbec (Aspirin) .... Take One Tablet By  Mouth Daily 6)  Hydrocodone-Acetaminophen 5-500 Mg Tabs (Hydrocodone-Acetaminophen) .... Take 1/2 By Mouth Every 8 Hours As Needed 7)  Fish Oil 1000 Mg Caps (Omega-3 Fatty Acids) .... Take One By Mouth Two Times A Day 8)  Ocuvite  Tabs (Multiple Vitamins-Minerals) .Marland Kitchen.. 1 Tab By Mouth Once Daily 9)  Allegra 180 Mg Tabs (Fexofenadine Hcl) .Marland Kitchen.. 1 Tab By Mouth Once Daily 10)  Hydrochlorothiazide 25 Mg Tabs (Hydrochlorothiazide) .... Take One Tablet By Mouth Daily. 11)  Colace 100 Mg Caps (Docusate Sodium) .Marland Kitchen.. 1 Tab By Mouth Two Times A Day  Allergies: 1)  ! * Avapro 2)  ! * Micardis Hct 3)  ! * Altace 4)  Levoxyl (Levothyroxine Sodium)  Past History:  Past Medical History: Current Problems:  HYPERCHOLESTEROLEMIA/ TRIG (219/497) (ICD-272.0) HYPERTENSION (ICD-401.9) ATRIAL FIBRILLATION (ICD-427.31) Hypertrophic cardiomyopathy ANEMIA, IRON DEFICIENCY (ICD-280.9) HYPOTHYROIDISM (ICD-244.9) GOITER, MULTINODULAR (ICD-241.1) DEGENERATIVE JOINT DISEASE, BACK (ICD-715.98) URINARY INCONTINENCE (ICD-788.30) OSTEOPOROSIS (ICD-733.00) DIVERTICULOSIS, COLON (ICD-562.10) ANEMIA (ICD-285.9)   Multiple facial lacerations.   C2 lateral mass fracture.  Right patellar fracture.  Right hand laceration.   Social History: Reviewed history from 05/09/2009 and no changes required. 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 Tobacco Use - No.   Review of Systems       Problems with arthritis but no fevers or chills, productive cough, hemoptysis, dysphasia, odynophagia, melena, hematochezia, dysuria, hematuria, rash, seizure activity, orthopnea, PND, pedal edema, claudication. Remaining systems are negative.  Vital Signs:  Patient profile:   77 year old female Height:      50 inches Weight:      133 pounds Pulse rate:   74 / minute Resp:     14 per minute BP  sitting:   135 / 45  (left arm)  Vitals Entered By: Kem Parkinson (January 30, 2010 2:56 PM)  Physical Exam  General:  Well-developed well-nourished in no acute distress.  Skin is warm and dry.  HEENT is normal.  Neck is supple. No thyromegaly.  Chest is clear to auscultation with normal expansion.  Cardiovascular exam is regular rate and rhythm. 3/6 systolic murmur left sternal border that increases with Valsalva. Abdominal exam nontender or distended. No masses palpated. Extremities show no edema. neuro grossly intact    EKG  Procedure date:  01/30/2010  Findings:      Sinus rhythm at a rate of 74. Left axis deviation. No significant ST changes.  Impression & Recommendations:  Problem # 1:  HYPERTROPHIC CARDIOMYOPATHY (ICD-425.1) Patient has hypertrophic physiology. She does have increased dyspnea on exertion. I suggested that we decrease her lisinopril and increase her Cardizem to hopefully improve cardiac relaxation. However she stated that when we tried that previously it made her feel bad. We will therefore make no changes and consider this in the future if her symptoms worsen.  The following medications were removed from the medication list:    Triamterene-hctz 37.5-25 Mg Tabs (Triamterene-hctz) ..... One tab by mouth in am Her updated medication list for this problem includes:    Lisinopril 20 Mg Tabs (Lisinopril) .Marland Kitchen... Take 1 tablet by mouth once a day    Cardizem Cd 180 Mg Cp24 (Diltiazem hcl coated beads) ..... One tab by mouth once daily    Aspirin Ec 325 Mg Tbec (Aspirin) .Marland Kitchen... Take one tablet by mouth daily    Hydrochlorothiazide 25 Mg Tabs (Hydrochlorothiazide) .Marland Kitchen... Take one tablet by mouth daily.  Problem # 2:  HYPERTENSION (ICD-401.9) Blood pressure controlled on present medications. Will continue. The following medications were removed from the medication list:    Triamterene-hctz 37.5-25 Mg Tabs (Triamterene-hctz) ..... One tab by mouth in am Her  updated medication list for this problem includes:    Lisinopril 20 Mg Tabs (Lisinopril) .Marland Kitchen... Take 1 tablet by mouth once a day    Cardizem Cd 180 Mg Cp24 (Diltiazem hcl coated beads) ..... One tab by mouth once daily    Aspirin Ec 325 Mg Tbec (Aspirin) .Marland Kitchen... Take one tablet by mouth daily    Hydrochlorothiazide 25 Mg Tabs (Hydrochlorothiazide) .Marland Kitchen... Take one tablet by mouth daily.  Problem # 3:  ATRIAL FIBRILLATION (ICD-427.31) Patient remains in sinus. Continue Cardizem. Continue aspirin. Coumadin discontinued secondary to repeated falls. Her updated medication list for this problem includes:    Aspirin Ec 325 Mg Tbec (Aspirin) .Marland Kitchen... Take one tablet by mouth daily  Problem # 4:  HYPERCHOLESTEROLEMIA/ TRIG (219/497) (ICD-272.0) Management per primary care.  Problem # 5:  HYPOTHYROIDISM (ICD-244.9)  Her updated medication list for this problem includes:    Levothyroxine Sodium 25 Mcg Tabs (Levothyroxine sodium) .Marland Kitchen... 1 tab by mouth once daily  Patient Instructions: 1)  Your physician recommends that you schedule a follow-up appointment in: 6 MONTHS

## 2011-01-05 NOTE — Assessment & Plan Note (Signed)
Summary: ER F/U FOR CHEST PAINS / LFW   Vital Signs:  Patient profile:   77 year old female Height:      50 inches Weight:      131.25 pounds BMI:     37.04 Temp:     98.1 degrees F oral Pulse rate:   68 / minute Pulse rhythm:   regular BP sitting:   142 / 70  (left arm) Cuff size:   regular  Vitals Entered By: Delilah Shan CMA Duncan Dull) (July 07, 2010 3:20 PM) CC: ER follow up for chest pain  [facility])   History of Present Illness: Had a heavy pain in chest and tightness. " I just couldn't breath."  Went to ER.  ER recs reviewed.   Hiatal hernia noted on CTA.  Has been taking zantac with relief.  Hadn't had trouble with heartburn for years.   Had used advil for OA.  Ate a lot of fried okra before the symtpoms started.   Current Medications (verified): 1)  Lisinopril 20 Mg Tabs (Lisinopril) .... Take 1 Tablet By Mouth Once A Day 2)  Levothyroxine Sodium 25 Mcg Tabs (Levothyroxine Sodium) .Marland Kitchen.. 1 Tab By Mouth Once Daily 3)  Allegra 180 Mg  Tabs (Fexofenadine Hcl) .... One Tab By Mouth Once Daily// Takes Only As Needed 4)  Cardizem Cd 180 Mg  Cp24 (Diltiazem Hcl Coated Beads) .... One Tab By Mouth Once Daily 5)  Aspirin Ec 325 Mg Tbec (Aspirin) .... Take One Tablet By Mouth Daily 6)  Hydrocodone-Acetaminophen 5-500 Mg Tabs (Hydrocodone-Acetaminophen) .... Take 1/2 By Mouth Every 8 Hours As Needed 7)  Fish Oil 1000 Mg Caps (Omega-3 Fatty Acids) .... Take One By Mouth Two Times A Day 8)  Ocuvite  Tabs (Multiple Vitamins-Minerals) .Marland Kitchen.. 1 Tab By Mouth Once Daily 9)  Allegra 180 Mg Tabs (Fexofenadine Hcl) .Marland Kitchen.. 1 Tab By Mouth Once Daily 10)  Hydrochlorothiazide 25 Mg Tabs (Hydrochlorothiazide) .... Take One Tablet By Mouth Daily. 11)  Zantac 150 Mg Tabs (Ranitidine Hcl) .... Take 1 Tablet By Mouth Two Times A Day X 10 Days  Allergies: 1)  ! * Avapro 2)  ! * Micardis Hct 3)  ! * Altace 4)  Levoxyl (Levothyroxine Sodium)  Past History:  Past Medical History: Last updated:  01/30/2010 Current Problems:  HYPERCHOLESTEROLEMIA/ TRIG (219/497) (ICD-272.0) HYPERTENSION (ICD-401.9) ATRIAL FIBRILLATION (ICD-427.31) Hypertrophic cardiomyopathy ANEMIA, IRON DEFICIENCY (ICD-280.9) HYPOTHYROIDISM (ICD-244.9) GOITER, MULTINODULAR (ICD-241.1) DEGENERATIVE JOINT DISEASE, BACK (ICD-715.98) URINARY INCONTINENCE (ICD-788.30) OSTEOPOROSIS (ICD-733.00) DIVERTICULOSIS, COLON (ICD-562.10) ANEMIA (ICD-285.9)   Multiple facial lacerations.   C2 lateral mass fracture.  Right patellar fracture.  Right hand laceration.   Social History: Reviewed history from 05/09/2009 and no changes required. 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 Tobacco Use - No.   Review of Systems       See HPI.  Otherwise negative.   CP and symptom free now.  "I feel fine."  Physical Exam  General:  GEN: nad, alert and oriented HEENT: mucous membranes moist NECK: supple w/o LA CV: rrr.  murmur unchanged from prev PULM: ctab, no inc wob ABD: soft, +bs EXT: no edema SKIN: no acute rash    Impression & Recommendations:  Problem # 1:  HIATAL HERNIA (ICD-553.3) D/w patient [id number] avoidance and taper of zantac.  She understands.  Anatomy of hiatal hernia d/w patient.  follow up as  needed . she agrees.   Complete Medication List: 1)  Lisinopril 20 Mg Tabs (Lisinopril) .... Take 1 tablet by mouth once a day 2)  Levothyroxine Sodium 25 Mcg Tabs (Levothyroxine sodium) .Marland Kitchen.. 1 tab by mouth once daily 3)  Allegra 180 Mg Tabs (Fexofenadine hcl) .... One tab by mouth once daily// takes only as needed 4)  Cardizem Cd 180 Mg Cp24 (Diltiazem hcl coated beads) .... One tab by mouth once daily 5)  Aspirin Ec 325 Mg Tbec (Aspirin) .... Take one tablet by mouth daily 6)  Hydrocodone-acetaminophen 5-500 Mg Tabs (Hydrocodone-acetaminophen) .... Take 1/2 by mouth every 8  hours as needed 7)  Fish Oil 1000 Mg Caps (Omega-3 fatty acids) .... Take one by mouth two times a day 8)  Ocuvite Tabs (Multiple vitamins-minerals) .Marland Kitchen.. 1 tab by mouth once daily 9)  Allegra 180 Mg Tabs (Fexofenadine hcl) .Marland Kitchen.. 1 tab by mouth once daily 10)  Hydrochlorothiazide 25 Mg Tabs (Hydrochlorothiazide) .... Take one tablet by mouth daily. 11)  Zantac 150 Mg Tabs (Ranitidine hcl) .... Take 1 tablet by mouth two times a day x 10 days  Patient Instructions: 1)  Use the zantac if you need it.  You may be able to stop it in a few days.  Let me know if you have more pain.   Current Allergies (reviewed today): ! * AVAPRO ! * MICARDIS HCT ! * ALTACE LEVOXYL (LEVOTHYROXINE SODIUM)

## 2011-01-05 NOTE — Letter (Signed)
Summary: [facility]  [facility]   Imported By: Lanelle Bal 10/23/2010 12:40:55  _____________________________________________________________________  External Attachment:    Type:   Image     Comment:   External Document  Appended Document: [facility]     Clinical Lists Changes  Observations: Added new observation of PAST MED HX: HYPERCHOLESTEROLEMIA/ TRIG (219/497) (ICD-272.0) HYPERTENSION (ICD-401.9) ATRIAL FIBRILLATION (ICD-427.31) Hypertrophic cardiomyopathy ANEMIA, IRON DEFICIENCY (ICD-280.9) HYPOTHYROIDISM (ICD-244.9) GOITER, MULTINODULAR (ICD-241.1) DEGENERATIVE JOINT DISEASE, BACK (ICD-715.98) URINARY INCONTINENCE (ICD-788.30) per Dr. McDiarmid OSTEOPOROSIS (ICD-733.00) DIVERTICULOSIS, COLON (ICD-562.10) ANEMIA (ICD-285.9)   Multiple facial lacerations.   C2 lateral mass fracture.  Right patellar fracture.  Right hand laceration.  (10/25/2010 21:24)       Past History:  Past Medical History: HYPERCHOLESTEROLEMIA/ TRIG (219/497) (ICD-272.0) HYPERTENSION (ICD-401.9) ATRIAL FIBRILLATION (ICD-427.31) Hypertrophic cardiomyopathy ANEMIA, IRON DEFICIENCY (ICD-280.9) HYPOTHYROIDISM (ICD-244.9) GOITER, MULTINODULAR (ICD-241.1) DEGENERATIVE JOINT DISEASE, BACK (ICD-715.98) URINARY INCONTINENCE (ICD-788.30) per Dr. McDiarmid OSTEOPOROSIS (ICD-733.00) DIVERTICULOSIS, COLON (ICD-562.10) ANEMIA (ICD-285.9)   Multiple facial lacerations.   C2 lateral mass fracture.  Right patellar fracture.  Right hand laceration.

## 2011-01-05 NOTE — Letter (Signed)
Summary: Appointment - Missed  [facility]     [place], [place]    Phone:   Fax:      December 18, 2009 MRN: [id number]   [facility] [address] [place], [place]  [address]   Dear Ms. Luhmann,  Our records indicate you missed your appointment on 1-12-2011with  Dr. Jens Som  It is very important that we reach you to reschedule this appointment. We look forward to participating in your health care needs. Please contact us at the number listed above at your earliest convenience to reschedule this appointment.     Sincerely,   Lorne Skeens  [facility] Scheduling Team

## 2011-01-05 NOTE — Letter (Signed)
Summary: Nadara Eaton letter  [place] [facility]  [address] [place], [place] [address]   Phone: [phone]  Fax: [phone]       07/09/2010 MRN: [id number]  [facility] [address] [place], [place]  [address]  Dear Ms. D[facility] [place], and [place] announce the retirement of Arta Silence, M.D., from full-time practice at the [facility] office effective June 04, 2010 and his plans of returning part-time.  It is important to Dr. Hetty Ely and to our practice that you understand that [facility] has seven physicians in our office for your health care needs.  We will continue to offer the same exceptional care that you have today.    Dr. Hetty Ely has spoken to many of you about his plans for retirement and returning part-time in the fall.   We will continue to work with you through the transition to schedule appointments for you in the office and meet the high standards that [facility] is committed to.   Again, it is with great pleasure that we share the news that Dr. Hetty Ely will return to [facility] in October of 2011 with a reduced schedule.    If you have any questions, or would like to request an appointment with one of our physicians, please call us at [phone] and press the option for Scheduling an appointment.  We take pleasure in providing you with excellent patient care and look forward to seeing you at your next office visit.  Our [facility] Physicians are:  Tillman Abide, M.D. Laurita Quint, M.D. Roxy Manns, M.D. Kerby Nora, M.D. Hannah Beat, M.D. Ruthe Mannan, M.D. We proudly welcomed Raechel Ache, M.D. and Eustaquio Boyden, M.D. to the practice in July/August 2011.  Sincerely,  [facility]

## 2011-01-07 NOTE — Letter (Signed)
Summary: [facility]  [facility]   Imported By: Maryln Gottron 11/16/2010 15:08:24  _____________________________________________________________________  External Attachment:    Type:   Image     Comment:   External Document  Appended Document: [facility]     Clinical Lists Changes

## 2011-01-07 NOTE — Progress Notes (Signed)
Summary: hydrcodone  Phone Note Refill Request Message from:  Patient on December 03, 2010 2:33 PM  Refills Requested: Medication #1:  HYDROCODONE-ACETAMINOPHEN 5-500 MG TABS Take 1/2 by mouth every 8 hours as needed Patient request rx to be sent to [facility].   Initial call taken by: Melody Comas,  December 03, 2010 2:34 PM  Follow-up for Phone Call        Rx faxed to [facility].  Follow-up by: Melody Comas,  December 03, 2010 2:49 PM    Prescriptions: HYDROCODONE-ACETAMINOPHEN 5-500 MG TABS (HYDROCODONE-ACETAMINOPHEN) Take 1/2 by mouth every 8 hours as needed  #90 x 2   Entered and Authorized by:   Shaune Leeks MD   Signed by:   Shaune Leeks MD on 12/03/2010   Method used:   Printed then faxed to ...       [facility]* (retail)             ,          Ph: [phone]       Fax: [phone]   RxID:   [id number]

## 2011-01-30 ENCOUNTER — Emergency Department (HOSPITAL_COMMUNITY)
Admission: EM | Admit: 2011-01-30 | Discharge: 2011-01-30 | Disposition: A | Discharge status: Home / Self Care | Payer: Medicare Other | Attending: Emergency Medicine | Admitting: Emergency Medicine

## 2011-01-30 DIAGNOSIS — R42 Dizziness and giddiness: Secondary | ICD-10-CM | POA: Insufficient documentation

## 2011-01-30 DIAGNOSIS — R5381 Other malaise: Secondary | ICD-10-CM | POA: Insufficient documentation

## 2011-01-30 DIAGNOSIS — Z7982 Long term (current) use of aspirin: Secondary | ICD-10-CM | POA: Insufficient documentation

## 2011-01-30 DIAGNOSIS — R0989 Other specified symptoms and signs involving the circulatory and respiratory systems: Secondary | ICD-10-CM | POA: Insufficient documentation

## 2011-01-30 DIAGNOSIS — R51 Headache: Secondary | ICD-10-CM | POA: Insufficient documentation

## 2011-01-30 DIAGNOSIS — I4891 Unspecified atrial fibrillation: Secondary | ICD-10-CM | POA: Insufficient documentation

## 2011-01-30 DIAGNOSIS — R630 Anorexia: Secondary | ICD-10-CM | POA: Insufficient documentation

## 2011-01-30 DIAGNOSIS — Z79899 Other long term (current) drug therapy: Secondary | ICD-10-CM | POA: Insufficient documentation

## 2011-01-30 DIAGNOSIS — E0590 Thyrotoxicosis, unspecified without thyrotoxic crisis or storm: Secondary | ICD-10-CM | POA: Insufficient documentation

## 2011-01-30 DIAGNOSIS — F411 Generalized anxiety disorder: Secondary | ICD-10-CM | POA: Insufficient documentation

## 2011-01-30 DIAGNOSIS — I10 Essential (primary) hypertension: Secondary | ICD-10-CM | POA: Insufficient documentation

## 2011-01-30 DIAGNOSIS — R0609 Other forms of dyspnea: Secondary | ICD-10-CM | POA: Insufficient documentation

## 2011-01-30 DIAGNOSIS — R0602 Shortness of breath: Secondary | ICD-10-CM | POA: Insufficient documentation

## 2011-01-30 LAB — URINALYSIS, ROUTINE W REFLEX MICROSCOPIC
Bilirubin Urine: NEGATIVE
Ketones, ur: NEGATIVE mg/dL
Nitrite: NEGATIVE
Protein, ur: NEGATIVE mg/dL
Urobilinogen, UA: 0.2 mg/dL (ref 0.0–1.0)
pH: 7 (ref 5.0–8.0)

## 2011-01-30 LAB — BASIC METABOLIC PANEL
BUN: 11 mg/dL (ref 6–23)
CO2: 25 mEq/L (ref 19–32)
Calcium: 8.6 mg/dL (ref 8.4–10.5)
Creatinine, Ser: 0.71 mg/dL (ref 0.4–1.2)
Glucose, Bld: 115 mg/dL — ABNORMAL HIGH (ref 70–99)
Sodium: 137 mEq/L (ref 135–145)

## 2011-01-30 LAB — URINE MICROSCOPIC-ADD ON

## 2011-01-30 LAB — POCT CARDIAC MARKERS
CKMB, poc: 1.7 ng/mL (ref 1.0–8.0)
Myoglobin, poc: 64.3 ng/mL (ref 12–200)

## 2011-02-19 ENCOUNTER — Encounter: Payer: Self-pay | Admitting: Cardiology

## 2011-02-19 ENCOUNTER — Ambulatory Visit (INDEPENDENT_AMBULATORY_CARE_PROVIDER_SITE_OTHER): Payer: Medicare Other | Admitting: Cardiology

## 2011-02-19 DIAGNOSIS — I10 Essential (primary) hypertension: Secondary | ICD-10-CM

## 2011-02-19 DIAGNOSIS — I421 Obstructive hypertrophic cardiomyopathy: Secondary | ICD-10-CM

## 2011-02-19 DIAGNOSIS — I4891 Unspecified atrial fibrillation: Secondary | ICD-10-CM

## 2011-02-19 DIAGNOSIS — E039 Hypothyroidism, unspecified: Secondary | ICD-10-CM

## 2011-02-20 LAB — COMPREHENSIVE METABOLIC PANEL
AST: 17 U/L (ref 0–37)
BUN: 10 mg/dL (ref 6–23)
CO2: 29 mEq/L (ref 19–32)
Calcium: 9 mg/dL (ref 8.4–10.5)
Chloride: 102 mEq/L (ref 96–112)
Creatinine, Ser: 0.8 mg/dL (ref 0.4–1.2)
GFR calc Af Amer: >60 mL/min (ref >60)
GFR calc non Af Amer: >60 mL/min (ref >60)
Glucose, Bld: 97 mg/dL (ref 70–99)
Total Bilirubin: 0.5 mg/dL (ref 0.3–1.2)

## 2011-02-20 LAB — URINALYSIS, ROUTINE W REFLEX MICROSCOPIC
Bilirubin Urine: NEGATIVE
Hgb urine dipstick: NEGATIVE
Ketones, ur: NEGATIVE mg/dL
Specific Gravity, Urine: 1.014 (ref 1.005–1.030)
Urobilinogen, UA: 0.2 mg/dL (ref 0.0–1.0)
pH: 7.5 (ref 5.0–8.0)

## 2011-02-20 LAB — POCT CARDIAC MARKERS: CKMB, poc: <1 ng/mL — ABNORMAL LOW (ref 1.0–8.0)

## 2011-02-20 LAB — CBC
HCT: 37.1 % (ref 36.0–46.0)
Hemoglobin: 12.6 g/dL (ref 12.0–15.0)
MCH: 29.7 pg (ref 26.0–34.0)
MCHC: 33.9 g/dL (ref 30.0–36.0)
MCV: 87.6 fL (ref 78.0–100.0)
RBC: 4.24 MIL/uL (ref 3.87–5.11)

## 2011-02-20 LAB — LIPASE, BLOOD: Lipase: 38 U/L (ref 11–59)

## 2011-02-20 LAB — DIFFERENTIAL
Basophils Absolute: 0 10*3/uL (ref 0.0–0.1)
Eosinophils Relative: 0 % (ref 0–5)
Lymphocytes Relative: 18 % (ref 12–46)
Lymphs Abs: 1 10*3/uL (ref 0.7–4.0)
Neutrophils Relative %: 69 % (ref 43–77)

## 2011-02-20 LAB — PROTIME-INR: INR: 1.05 (ref 0.00–1.49)

## 2011-02-20 LAB — URINE MICROSCOPIC-ADD ON

## 2011-02-20 LAB — D-DIMER, QUANTITATIVE: D-Dimer, Quant: 0.7 ug/mL-FEU — ABNORMAL HIGH (ref 0.00–0.48)

## 2011-02-21 ENCOUNTER — Encounter: Payer: Self-pay | Admitting: Cardiology

## 2011-02-21 NOTE — Assessment & Plan Note (Signed)
Plan continue Cardizem. Conservative measures given age.

## 2011-02-21 NOTE — Assessment & Plan Note (Signed)
Management per primary care. 

## 2011-02-21 NOTE — Progress Notes (Signed)
HPI: Cindy [name] is a pleasant female who has a history of paroxysmal atrial fibrillation and hypertrophic obstructive cardiomyopathy physiology. Myoview performed in November of 2007 showed an ejection fraction of 70%, normal perfusion and mild soft tissue attenuation. Her last echocardiogram was performed in June of 2010. This revealed hyperdynamic LV function with near cavity obliteration. Peak gradent through LVOT/AV is 108 mm Hg. The ejection fraction was 75% and there was mild LVH.There was dynamic obstruction at rest at an indeterminate location, with mid-cavity obliteration, a peak velocity of 402cm/sec, and a peak gradient of 65mm Hg. Doppler parameters are consistent with abnormal left ventricular relaxation (grade 1 diastolic dysfunction). There was mild aortic insufficiency.  I last saw her in August 2011. Coumadin dced previously because of recurrent falls. Since then she has some fatigue but denies dyspnea or syncope. I last saw her in August of 2011. Since then, the patient has dyspnea with more extreme activities but not with routine activities. It is relieved with rest. It is not associated with chest pain. There is no orthopnea, PND or pedal edema. There is no syncope or palpitations. There is no exertional chest pain.  Current Outpatient Prescriptions  Medication Sig Dispense Refill  . aspirin 325 MG tablet Take 325 mg by mouth daily.        Marland Kitchen diltiazem (CARDIZEM CD) 180 MG 24 hr capsule Take 180 mg by mouth daily.        . fexofenadine (ALLEGRA) 180 MG tablet Take 180 mg by mouth daily as needed.        . fish oil-omega-3 fatty acids 1000 MG capsule Take 1 g by mouth 2 (two) times daily.        . hydrochlorothiazide 25 MG tablet Take 25 mg by mouth daily.        Marland Kitchen HYDROcodone-acetaminophen (VICODIN) 5-500 MG per tablet Take 1 tablet by mouth. Take 1/2 tablet every 8 hours as needed       . levothyroxine (SYNTHROID, LEVOTHROID) 25 MCG tablet Take 25 mcg by mouth daily.        Marland Kitchen lisinopril  (PRINIVIL,ZESTRIL) 20 MG tablet Take 20 mg by mouth daily.        . Multiple Vitamins-Minerals (OCUVITE PRESERVISION) TABS Take 1 tablet by mouth daily.        . ranitidine (ZANTAC) 150 MG capsule Take 150 mg by mouth as needed.        . sulfamethoxazole-trimethoprim (BACTRIM DS,SEPTRA DS) 800-160 MG per tablet Take 1 tablet by mouth daily.           Past Medical History  Diagnosis Date  . Hypercholesterolemia   . Hypertension   . Atrial fibrillation   . Hypertrophic cardiomyopathy   . Anemia, iron deficiency   . Hypothyroidism   . Multinodular goiter (nontoxic)   . Degenerative joint disease     back  . Urinary incontinence   . Osteoporosis   . Diverticulosis of colon (without mention of hemorrhage)   . Anemia   . Patellar fracture     right  . Cervical mass     C2 lateral mass fracture    Past Surgical History  Procedure Date  . Tonsillectomy   . Appendectomy   . Breast reduction surgery   . Bladder surgery     bladder tack early 90's  . Dilation and curettage, diagnostic / therapeutic 09/07/00    endometrial polyps removed, path all benign   . Cataract extraction 04/03  . Tear duct probing 07/29/03  tear duck surg  . Cystourethroscopy 09/17/03  . Rotator cuff repair 09/07/05    right rotator cuff surg    History   Social History  . Marital Status: Married    Spouse Name: N/A    Number of Children: N/A  . Years of Education: N/A   Occupational History  . Not on file.   Social History Main Topics  . Smoking status: Never Smoker   . Smokeless tobacco: Never Used  . Alcohol Use: No  . Drug Use: No  . Sexually Active: Not on file   Other Topics Concern  . 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 Remaining systems are negative.  Physical Exam: Well-developed well-nourished in no acute distress.  Skin is warm and dry.  HEENT is normal.  Neck is supple. No thyromegaly.  Chest is clear to auscultation with normal expansion.  Cardiovascular exam is regular rate and rhythm. 2/6 systolic murmur LSB Abdominal exam nontender or distended. No masses palpated. Extremities show no edema. neuro grossly intact  ECG     Sinus rhythm, left ventricular hypertrophy, left anterior fascicular block, prior septal infarct.

## 2011-02-21 NOTE — Assessment & Plan Note (Signed)
Patient complains of cough. Discontinue lisinopril. Begin Cozaar 50 mg daily. Check potassium and renal function in one week.

## 2011-02-21 NOTE — Assessment & Plan Note (Signed)
She remains in sinus rhythm. Continue aspirin and Cardizem. Not a Coumadin candidate given her history of falls.

## 2011-02-23 NOTE — Assessment & Plan Note (Signed)
Summary: FOLLOW UP - 6 MONTHS  Medications Added LOSARTAN POTASSIUM 50 MG TABS (LOSARTAN POTASSIUM) ONE TABLET by mouth once daily      Allergies Added:   Visit Type:  Follow-up Primary Provider:  Dr. Para March   History of Present Illness: Cindy [name] is a pleasant female who has a history of paroxysmal atrial fibrillation and hypertrophic obstructive cardiomyopathy physiology. Myoview performed in November of 2007 showed an ejection fraction of 70%, normal perfusion and mild soft tissue attenuation. Her last echocardiogram was performed in June of 2010. This revealed hyperdynamic LV function with near cavity obliteration. Peak gradent through LVOT/AV is 108 mm Hg. The ejection fraction was 75% and there was mild LVH.There was dynamic obstruction at rest at an indeterminate location, with mid-cavity obliteration, a peak velocity of 402cm/sec, and a peak gradient of 65mm Hg. Doppler parameters are consistent with abnormal left ventricular relaxation (grade 1 diastolic dysfunction). There was mild aortic insufficiency.  I last saw her in August 2011. Coumadin dced previously because of recurrent falls. Since then she has some fatigue but denies dyspnea or syncope. I last saw her in August of 2011. Since then, the patient has dyspnea with more extreme activities but not with routine activities. It is relieved with rest. It is not associated with chest pain. There is no orthopnea, PND or pedal edema. There is no syncope or palpitations. There is no exertional chest pain.   Current Medications (verified): 1)  Lisinopril 20 Mg Tabs (Lisinopril) .... Take 1 Tablet By Mouth Once A Day 2)  Levothyroxine Sodium 25 Mcg Tabs (Levothyroxine Sodium) .Marland Kitchen.. 1 Tab By Mouth Once Daily 3)  Allegra 180 Mg  Tabs (Fexofenadine Hcl) .... One Tab By Mouth Once Daily// Takes Only As Needed 4)  Cardizem Cd 180 Mg  Cp24 (Diltiazem Hcl Coated Beads) .... One Tab By Mouth Once Daily 5)  Aspirin Ec 325 Mg Tbec (Aspirin) ....  Take One Tablet By Mouth Daily 6)  Hydrocodone-Acetaminophen 5-500 Mg Tabs (Hydrocodone-Acetaminophen) .... Take 1/2 By Mouth Every 8 Hours As Needed 7)  Fish Oil 1000 Mg Caps (Omega-3 Fatty Acids) .... Take One By Mouth Two Times A Day 8)  Ocuvite  Tabs (Multiple Vitamins-Minerals) .Marland Kitchen.. 1 Tab By Mouth Once Daily 9)  Allegra 180 Mg Tabs (Fexofenadine Hcl) .Marland Kitchen.. 1 Tab By Mouth Once Daily 10)  Hydrochlorothiazide 25 Mg Tabs (Hydrochlorothiazide) .... Take One Tablet By Mouth Daily. 11)  Zantac 150 Mg Tabs (Ranitidine Hcl) .... As Needed  Allergies (verified): 1)  ! * Avapro 2)  ! * Micardis Hct 3)  ! * Altace 4)  Levoxyl (Levothyroxine Sodium)  Past History:  Past Medical History: Reviewed history from 10/25/2010 and no changes required. HYPERCHOLESTEROLEMIA/ TRIG (219/497) (ICD-272.0) HYPERTENSION (ICD-401.9) ATRIAL FIBRILLATION (ICD-427.31) Hypertrophic cardiomyopathy ANEMIA, IRON DEFICIENCY (ICD-280.9) HYPOTHYROIDISM (ICD-244.9) GOITER, MULTINODULAR (ICD-241.1) DEGENERATIVE JOINT DISEASE, BACK (ICD-715.98) URINARY INCONTINENCE (ICD-788.30) per Dr. McDiarmid OSTEOPOROSIS (ICD-733.00) DIVERTICULOSIS, COLON (ICD-562.10) ANEMIA (ICD-285.9)   Multiple facial lacerations.   C2 lateral mass fracture.  Right patellar fracture.  Right hand laceration.   Past Surgical History: Reviewed history from 05/08/2009 and no changes required. Tonsillectomy Appendectomy 1941 Breast Reduction 1981 Bladder Tack early 1990s Collagen implant 1998 DEXA - osteoporosis 12/99 EGD/Colonosc,  Barrett's esophagus f/u 2003, H. H., divertics 09/01 D & C , endometrial polyps removed, Path all benign 09/07/00 SBFT negative  10/01 ECHO, EF wnl,  mild A. stenosis, - A. S. mild MR, mild TR 03/05/02 Cataract OS 04/03 Tear duct surg  (Dr. Sheffield Slider)  07/29/03 Adenosine cardiolite wnl EF 65%, ? Neoplasm 09/11/03 ECHO EF 70%, LVH, ? diast dysfuncion 09/11/03 Cystourecthroscopy Earlene Plater) 09/17/03 Thyroid U/S,  MNG, no dominant masses 10/14/03 Right rotator cuff surg. (Dr Darrelyn Hillock)  09/07/05 [facility] AFib, CT chest negative, CVTS/ P. E. 04/23- 03/29/06   Open reduction and internal fixation.        Social History: Reviewed history from 05/09/2009 and no changes required. 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 Tobacco Use - No.   Review of Systems       no fevers or chills, productive cough, hemoptysis, dysphasia, odynophagia, melena, hematochezia, dysuria, hematuria, rash, seizure activity, orthopnea, PND, pedal edema, claudication. Remaining systems are negative.   Vital Signs:  Patient profile:   77 year old female Height:      50 inches Weight:      133.75 pounds BMI:     37.75 Pulse rate:   72 / minute BP sitting:   131 / 52  (left arm) Cuff size:   regular  Vitals Entered By: Caralee Ates CMA (February 19, 2011 11:37 AM)  Physical Exam  General:  Well-developed well-nourished in no acute distress.  Skin is warm and dry.  HEENT is normal.  Neck is supple. No thyromegaly.  Chest is clear to auscultation with normal expansion.  Cardiovascular exam is regular rate and rhythm. 2/6 systolic murmur left sternal border. Abdominal exam nontender or distended. No masses palpated. Extremities show no edema. neuro grossly intact    EKG  Procedure date:  02/19/2011  Findings:      Sinus rhythm, left ventricular hypertrophy, left anterior fascicular block, prior septal infarct.  Impression & Recommendations:  Problem # 1:  HYPERTROPHIC CARDIOMYOPATHY (ICD-425.1) Plan continue Cardizem. Conservative measures given age. Her updated medication list for this problem includes:    Losartan Potassium 50 Mg Tabs (Losartan potassium) ..... One tablet by mouth once daily    Cardizem Cd 180 Mg Cp24 (Diltiazem hcl coated beads) ..... One tab by mouth once  daily    Aspirin Ec 325 Mg Tbec (Aspirin) .Marland Kitchen... Take one tablet by mouth daily    Hydrochlorothiazide 25 Mg Tabs (Hydrochlorothiazide) .Marland Kitchen... Take one tablet by mouth daily.  Problem # 2:  HYPERTENSION (ICD-401.9)  Patient complains of cough. Discontinue lisinopril. He began Cozaar 50 mg daily. Check potassium and renal function in one week. Her updated medication list for this problem includes:    Losartan Potassium 50 Mg Tabs (Losartan potassium) ..... One tablet by mouth once daily    Cardizem Cd 180 Mg Cp24 (Diltiazem hcl coated beads) ..... One tab by mouth once daily    Aspirin Ec 325 Mg Tbec (Aspirin) .Marland Kitchen... Take one tablet by mouth daily    Hydrochlorothiazide 25 Mg Tabs (Hydrochlorothiazide) .Marland Kitchen... Take one tablet by mouth daily.  Her updated medication list for this problem includes:    Losartan Potassium 50 Mg Tabs (Losartan potassium) ..... One tablet by mouth once daily    Cardizem Cd 180 Mg Cp24 (Diltiazem hcl coated beads) ..... One tab by mouth once daily    Aspirin Ec 325 Mg Tbec (Aspirin) .Marland Kitchen... Take one tablet by mouth daily    Hydrochlorothiazide 25 Mg Tabs (Hydrochlorothiazide) .Marland Kitchen... Take one tablet by mouth daily.  Problem # 3:  ATRIAL FIBRILLATION (ICD-427.31)  She remains in sinus rhythm. Continue aspirin and Cardizem. Not a Coumadin candidate given her history  of falls. Her updated medication list for this problem includes:    Aspirin Ec 325 Mg Tbec (Aspirin) .Marland Kitchen... Take one tablet by mouth daily  Her updated medication list for this problem includes:    Aspirin Ec 325 Mg Tbec (Aspirin) .Marland Kitchen... Take one tablet by mouth daily  Problem # 4:  HYPOTHYROIDISM (ICD-244.9)  Her updated medication list for this problem includes:    Levothyroxine Sodium 25 Mcg Tabs (Levothyroxine sodium) .Marland Kitchen... 1 tab by mouth once daily  Her updated medication list for this problem includes:    Levothyroxine Sodium 25 Mcg Tabs (Levothyroxine sodium) .Marland Kitchen... 1 tab by mouth once daily  Patient  Instructions: 1)  Your physician recommends that you return for lab work in:ONE WEEK AFTER STARTING LORSARTAN 2)  Your physician has recommended you make the following change in your medication: STOP LISINOPRIL 3)  START LOSARTAN 50MG  ONCE DAILY 4)  Your physician wants you to follow-up in: ONE YEAR  You will receive a reminder letter in the mail two months in advance. If you don't receive a letter, please call our office to schedule the follow-up appointment. Prescriptions: LOSARTAN POTASSIUM 50 MG TABS (LOSARTAN POTASSIUM) ONE TABLET by mouth once daily  #90 x 4   Entered by:   Deliah Goody, RN   Authorized by:   Ferman Hamming, MD, [facility]   Signed by:   Deliah Goody, RN on 02/19/2011   Method used:   Electronically to        [facility]* (retail)             ,          Ph: [phone]       Fax: [phone]   RxID:   [id number]

## 2011-03-16 ENCOUNTER — Telehealth: Payer: Self-pay | Admitting: Cardiology

## 2011-03-16 LAB — CBC
HCT: 24 % — ABNORMAL LOW (ref 36.0–46.0)
HCT: 25 % — ABNORMAL LOW (ref 36.0–46.0)
HCT: 36.4 % (ref 36.0–46.0)
Hemoglobin: 8.2 g/dL — ABNORMAL LOW (ref 12.0–15.0)
Hemoglobin: 8.4 g/dL — ABNORMAL LOW (ref 12.0–15.0)
Hemoglobin: 11.3 g/dL — ABNORMAL LOW (ref 12.0–15.0)
Hemoglobin: 12.3 g/dL (ref 12.0–15.0)
MCHC: 33.9 g/dL (ref 30.0–36.0)
MCV: 86 fL (ref 78.0–100.0)
MCV: 87.4 fL (ref 78.0–100.0)
MCV: 86.2 fL (ref 78.0–100.0)
MCV: 86.6 fL (ref 78.0–100.0)
Platelets: 149 10*3/uL — ABNORMAL LOW (ref 150–400)
Platelets: 164 10*3/uL (ref 150–400)
Platelets: 170 10*3/uL (ref 150–400)
RBC: 2.77 MIL/uL — ABNORMAL LOW (ref 3.87–5.11)
RBC: 2.86 MIL/uL — ABNORMAL LOW (ref 3.87–5.11)
RBC: 2.9 MIL/uL — ABNORMAL LOW (ref 3.87–5.11)
RBC: 3.84 MIL/uL — ABNORMAL LOW (ref 3.87–5.11)
RBC: 4.22 MIL/uL (ref 3.87–5.11)
RDW: 14.2 % (ref 11.5–15.5)
RDW: 14.3 % (ref 11.5–15.5)
WBC: 4.7 10*3/uL (ref 4.0–10.5)
WBC: 5.8 10*3/uL (ref 4.0–10.5)
WBC: 6.2 10*3/uL (ref 4.0–10.5)
WBC: 7.8 10*3/uL (ref 4.0–10.5)

## 2011-03-16 LAB — DIFFERENTIAL
Basophils Absolute: 0 10*3/uL (ref 0.0–0.1)
Eosinophils Absolute: 0.1 10*3/uL (ref 0.0–0.7)
Eosinophils Relative: 1 % (ref 0–5)
Lymphocytes Relative: 20 % (ref 12–46)
Neutrophils Relative %: 71 % (ref 43–77)

## 2011-03-16 LAB — BASIC METABOLIC PANEL
BUN: 10 mg/dL (ref 6–23)
CO2: 29 mEq/L (ref 19–32)
CO2: 26 mEq/L (ref 19–32)
Calcium: 8.1 mg/dL — ABNORMAL LOW (ref 8.4–10.5)
Chloride: 97 mEq/L (ref 96–112)
Chloride: 102 mEq/L (ref 96–112)
Creatinine, Ser: 0.73 mg/dL (ref 0.4–1.2)
GFR calc Af Amer: >60 mL/min (ref >60)
GFR calc Af Amer: >60 mL/min (ref >60)
GFR calc non Af Amer: >60 mL/min (ref >60)
Glucose, Bld: 103 mg/dL — ABNORMAL HIGH (ref 70–99)
Glucose, Bld: 151 mg/dL — ABNORMAL HIGH (ref 70–99)
Sodium: 134 mEq/L — ABNORMAL LOW (ref 135–145)
Sodium: 138 mEq/L (ref 135–145)

## 2011-03-16 LAB — COMPREHENSIVE METABOLIC PANEL
AST: 22 U/L (ref 0–37)
BUN: 17 mg/dL (ref 6–23)
CO2: 25 mEq/L (ref 19–32)
Chloride: 101 mEq/L (ref 96–112)
Creatinine, Ser: 0.74 mg/dL (ref 0.4–1.2)
GFR calc Af Amer: >60 mL/min (ref >60)
GFR calc non Af Amer: >60 mL/min (ref >60)
Glucose, Bld: 134 mg/dL — ABNORMAL HIGH (ref 70–99)
Total Bilirubin: 0.3 mg/dL (ref 0.3–1.2)

## 2011-03-16 LAB — POCT CARDIAC MARKERS

## 2011-03-16 LAB — PROTIME-INR
INR: 2.6 — ABNORMAL HIGH (ref 0.00–1.49)
INR: 2.6 — ABNORMAL HIGH (ref 0.00–1.49)
INR: 2.5 — ABNORMAL HIGH (ref 0.00–1.49)
Prothrombin Time: 29.8 seconds — ABNORMAL HIGH (ref 11.6–15.2)
Prothrombin Time: 28.8 seconds — ABNORMAL HIGH (ref 11.6–15.2)

## 2011-03-16 LAB — CARDIAC PANEL(CRET KIN+CKTOT+MB+TROPI)
CK, MB: 5.7 ng/mL — ABNORMAL HIGH (ref 0.3–4.0)
Relative Index: 3.7 — ABNORMAL HIGH (ref 0.0–2.5)
Total CK: 156 U/L (ref 7–177)

## 2011-03-16 LAB — PREPARE FRESH FROZEN PLASMA

## 2011-03-16 LAB — ABO/RH: ABO/RH(D): A NEG

## 2011-03-16 NOTE — Telephone Encounter (Signed)
LOV faxed to [facility] @ [facility] @ [phone] 03/16/11/KM

## 2011-03-23 ENCOUNTER — Ambulatory Visit (HOSPITAL_BASED_OUTPATIENT_CLINIC_OR_DEPARTMENT_OTHER)
Admission: RE | Admit: 2011-03-23 | Discharge: 2011-03-23 | Disposition: A | Discharge status: Home / Self Care | Payer: Medicare Other | Source: Ambulatory Visit | Attending: Urology | Admitting: Urology

## 2011-03-23 DIAGNOSIS — E039 Hypothyroidism, unspecified: Secondary | ICD-10-CM | POA: Insufficient documentation

## 2011-03-23 DIAGNOSIS — Z79899 Other long term (current) drug therapy: Secondary | ICD-10-CM | POA: Insufficient documentation

## 2011-03-23 DIAGNOSIS — K219 Gastro-esophageal reflux disease without esophagitis: Secondary | ICD-10-CM | POA: Insufficient documentation

## 2011-03-23 DIAGNOSIS — Z7982 Long term (current) use of aspirin: Secondary | ICD-10-CM | POA: Insufficient documentation

## 2011-03-23 DIAGNOSIS — N393 Stress incontinence (female) (male): Secondary | ICD-10-CM | POA: Insufficient documentation

## 2011-03-23 DIAGNOSIS — E7800 Pure hypercholesterolemia, unspecified: Secondary | ICD-10-CM | POA: Insufficient documentation

## 2011-03-23 DIAGNOSIS — I4891 Unspecified atrial fibrillation: Secondary | ICD-10-CM | POA: Insufficient documentation

## 2011-03-23 DIAGNOSIS — I10 Essential (primary) hypertension: Secondary | ICD-10-CM | POA: Insufficient documentation

## 2011-03-23 DIAGNOSIS — N8111 Cystocele, midline: Secondary | ICD-10-CM | POA: Insufficient documentation

## 2011-03-23 DIAGNOSIS — D649 Anemia, unspecified: Secondary | ICD-10-CM | POA: Insufficient documentation

## 2011-03-23 LAB — CBC
Platelets: 242 10*3/uL (ref 150–400)
RBC: 4.96 MIL/uL (ref 3.87–5.11)
WBC: 6.3 10*3/uL (ref 4.0–10.5)

## 2011-03-23 LAB — APTT: aPTT: 29 seconds (ref 24–37)

## 2011-03-23 LAB — BASIC METABOLIC PANEL
GFR calc non Af Amer: >60 mL/min (ref >60)
Glucose, Bld: 111 mg/dL — ABNORMAL HIGH (ref 70–99)
Potassium: 4.2 mEq/L (ref 3.5–5.1)
Sodium: 137 mEq/L (ref 135–145)

## 2011-03-23 LAB — PROTIME-INR: Prothrombin Time: 12.6 seconds (ref 11.6–15.2)

## 2011-03-24 LAB — TYPE AND SCREEN
ABO/RH(D): A NEG
DAT, IgG: NEGATIVE
Unit division: 0

## 2011-03-25 ENCOUNTER — Other Ambulatory Visit (INDEPENDENT_AMBULATORY_CARE_PROVIDER_SITE_OTHER): Payer: Medicare Other

## 2011-03-25 DIAGNOSIS — Z79899 Other long term (current) drug therapy: Secondary | ICD-10-CM

## 2011-03-25 DIAGNOSIS — I10 Essential (primary) hypertension: Secondary | ICD-10-CM

## 2011-03-25 LAB — BASIC METABOLIC PANEL
BUN: 15 mg/dL (ref 6–23)
Chloride: 103 mEq/L (ref 96–112)
Potassium: 3.8 mEq/L (ref 3.5–5.1)
Sodium: 139 mEq/L (ref 135–145)

## 2011-04-01 ENCOUNTER — Ambulatory Visit (HOSPITAL_BASED_OUTPATIENT_CLINIC_OR_DEPARTMENT_OTHER)
Admission: RE | Admit: 2011-04-01 | Discharge: 2011-04-01 | Disposition: A | Discharge status: Home / Self Care | Payer: Medicare Other | Source: Ambulatory Visit | Attending: Urology | Admitting: Urology

## 2011-04-01 DIAGNOSIS — I10 Essential (primary) hypertension: Secondary | ICD-10-CM | POA: Insufficient documentation

## 2011-04-01 DIAGNOSIS — I4891 Unspecified atrial fibrillation: Secondary | ICD-10-CM | POA: Insufficient documentation

## 2011-04-01 DIAGNOSIS — R339 Retention of urine, unspecified: Secondary | ICD-10-CM | POA: Insufficient documentation

## 2011-04-01 DIAGNOSIS — K219 Gastro-esophageal reflux disease without esophagitis: Secondary | ICD-10-CM | POA: Insufficient documentation

## 2011-04-01 DIAGNOSIS — Z01812 Encounter for preprocedural laboratory examination: Secondary | ICD-10-CM | POA: Insufficient documentation

## 2011-04-01 LAB — POCT I-STAT 4, (NA,K, GLUC, HGB,HCT)
Glucose, Bld: 105 mg/dL — ABNORMAL HIGH (ref 70–99)
HCT: 41 % (ref 36.0–46.0)
Hemoglobin: 13.9 g/dL (ref 12.0–15.0)
Potassium: 4.1 mEq/L (ref 3.5–5.1)
Sodium: 138 mEq/L (ref 135–145)

## 2011-04-19 NOTE — Op Note (Addendum)
NAMELIZBET, CIRRINCIONE NO.:  [id number]  MEDICAL RECORD NO.:  [id number]           PATIENT TYPE:  E  LOCATION:  [facility]                         FACILITY:  [facility]  PHYSICIAN:  Martina Sinner, MD DATE OF BIRTH:  08/13/1922  DATE OF PROCEDURE:  04/01/2011 DATE OF DISCHARGE:  04/01/2011                              OPERATIVE REPORT   PREOPERATIVE DIAGNOSIS:  Urinary retention.  POSTOPERATIVE DIAGNOSIS:  Urinary retention.  SURGEON:  Kandra Graven A. Britain Saber, MD  ASSISTANT:  Delia Chimes, NP.  INDICATIONS:  Ms. Mooradian is 7 to 9 days post sling and is in retention. I thought it was best to lyse the sling down.  DESCRIPTION OF PROCEDURE:  The patient was prepped and draped in usual fashion.  Preoperative antibiotics were given.  The urethral catheter was removed.  I cystoscoped the patient.  There was no injury to bladder.  I had a good look at 2 o'clock and 10 o'clock.  She had no urethral erosion. She had reasonable urethral length.  I was able to easily cut and finger dissect to open up her incision which is only 75 to 78 days old.  I could palpate the sling and one could argue that it was a little bit proximal but overall in the mid urethra.  Again, I used a thin curved handheld beaver as the vaginal speculum.  I used Babcock to the left and right of the urethra to try to give some descensus and retraction and I spread her labia open.  I had the table up very high and in Trendelenburg because exposure was very difficult.  Within a few moments, I was able to place Strully scissors to her left, just to the left of the urethra and catch the sling and rotate it.  I then used 2 small hemostats and cut between them.  I then removed the suburethral component more on the left than on the right.  I left sling attached to the endopelvic fascia bilaterally but overall I moved approximately 1.5 cm of sling or longer.  A lot of copious irrigation was utilized with  antibiotic.  A little bit due to the Cygnet, but mainly because she has thin, atrophic vaginal wall noted on my first dictation and because postoperatively her tissues were very friable, I took a long time to close the best as I could with interrupted 2-0 Vicryl on SH needles.  I thought that the closure was good.  There should not be any exposed sling based upon the area that was excised.  It will epithelialize with Premarin cream if there is any open area.  I cystoscoped the patient at the end of the case and again her anatomy was normal.  Foley catheter was reinserted and draining well.  Vaginal pack was inserted.  Hopefully Ms. Ava Niebla will void well and I would not be surprised if she is once again incontinent though the fascial straps were left intact.          ______________________________ Martina Sinner, MD     SAM/MEDQ  D:  04/01/2011  T:  04/01/2011  Job:  [id number]  Electronically Signed by Alfredo Martinez MD on 05/03/2011 06:17:06 PM

## 2011-04-19 NOTE — Op Note (Signed)
NAMEKEIVA, [name] NO.:  [id number]  MEDICAL RECORD NO.:  [id number]           PATIENT TYPE:  E  LOCATION:  [facility]                         FACILITY:  [facility]  PHYSICIAN:  Martina Sinner, MD DATE OF BIRTH:  1922/09/12  DATE OF PROCEDURE: DATE OF DISCHARGE:  01/30/2011                              OPERATIVE REPORT   PREOPERATIVE DIAGNOSIS:  Stress urinary continence.  POSTOPERATIVE DIAGNOSIS:  Stress urinary continence.  SURGERY:  Sling, cystourethropexy [facility]) plus cystoscopy.  [name] has stress urinary incontinence.  She has a fairly fixed bladder neck.  Success rates were discussed.  She has had a previous TOT procedure.  The patient was prepped and draped in usual fashion.  Preoperative antibiotics were given.  Extra care was taken with leg positioning to minimize risk of compartment syndrome, neuropathy and DVT.  On examination, she had a narrow introitus.  She had fairly fixed high- riding bladder neck and not surprisingly short urethra.  She had a grade 1 cystocele.  There was scarring in the area, and it almost looked like the previous sling was quite proximal.  I could fit a weighted vaginal speculum in the vagina, but it distorted the anatomy because of the narrowness.  I used a lightly retracted Kerr cerebellar retractor for most of the case and sometimes in combination with a handheld narrowed beaver that would fit in the posterior vaginal wall.  There was a little bit of traction on the Foley catheter.  I could draw a line along the undersurface of the urethra at 6 o'clock and instilled approximately 5 cc of lidocaine and epinephrine mixture.  She had quite a bit of atrophy.  Under direct vision, I made an appropriate deep 1.5 cm and possibly 2 cm suburethral incision.  I sharply dissected to the urethrovesical angle bilaterally.  I was very pleased with the dissection at this point, recognizing her tissues were quite thin.   The underlying urethra looked healthy.  At this point, I could easily see her old sling that was a bit proximal, and it was cut in the midline but left otherwise in situ.  I made two 1-cm incisions one fingerbreadth above the symphysis pubis and 1.5 cm lateral to the midline.  With the bladder emptied, I passed the [facility] needle on top along the back of symphysis pubis staying lateral and turning in and out of the pulp of my index finger bilaterally.  The box technique was utilized.  I spent a lot of time cystoscoping the patient and there is no injury to bladder or urethra. Her urethra looked short and scarred.  There was no wiggling of bladder with wiggling of the trocar.  There were good blue jets bilaterally though it took little bit more time on the patient's right side.  With the bladder empty, I attached a SPARC sling and brought it up through the retropubic space and tensioned it over the fat part of the moderate-sized Kelly clamp.  I cut below the blue dots, irrigated the sheath and removed the sheath.  I spent quite a bit of time  on tenting the sling.  I was very pleased with its position in the mid urethra and degree of hypermobility with no spring back effect.  I recognized she had moderate urethral insufficiency, but I did not want to over correct her urethra causing an erosion with her thin tissues.  Again, I was very pleased with the tensioning.  I irrigated all incisions.  I closed the anterior vaginal wall with running 2-0 Vicryl on SH needle since the vagina was quite narrow.  I used 2 interrupted sutures.  I cut the sling below the skin and closed the abdominal incision with 4-0 Vicryl and Dermabond.  Bladder was draining blue urine at the end of the case.  Leg position was good.  Vaginal pack was inserted.  Foley catheter was capped.  Hopefully, the operation reached the treatment goal.  Total blood loss was less than 50 mL           ______________________________ Martina Sinner, MD     SAM/MEDQ  D:  03/23/2011  T:  03/23/2011  Job:  [id number]  Electronically Signed by Alfredo Martinez MD on 04/19/2011 01:14:29 PM

## 2011-04-20 NOTE — Assessment & Plan Note (Signed)
[facility]                            CARDIOLOGY OFFICE NOTE   NAME:Cindy [name], Cindy [name]                          MRN:          [id number]  DATE:03/01/2008                            DOB:          1922/02/24    [name] is a very pleasant 77 year old female who has a history of  paroxysmal atrial fibrillation as well as hypertrophic obstructive  cardiomyopathy physiology.  She was recently seen at [facility]  Emergency Room secondary to palpitations.  When she was seen by EMS  prior to the emergency room, she apparently was in atrial fibrillation  at rates between 120 and 130 by her report.  Her blood pressure was also  elevated.  She did convert spontaneously.  Since that time, she has mild  dyspnea on exertion which has been a chronic issue, but there is no  orthopnea, PND, pedal edema, presyncope, syncope, or exertional chest  pain.   MEDICATIONS:  1. HCTZ 25 mg p.o. daily.  2. Synthroid 25 mcg p.o. daily.  3. Coumadin as directed.  4. Cardizem 180 mg p.o. daily.  5. Lisinopril 20 mg p.o. daily.  6. Pressure vision and Geritol.   PHYSICAL EXAM:  Blood pressure of 157/52 and a pulse 74.  She weighs 141  pounds.  HEENT:  Normal.  NECK:  Supple.  CHEST:  Clear.  CARDIOVASCULAR:  Regular rhythm.  There is a 2/6 systolic murmur at the  left sternal border.  It increases with Valsalva.  ABDOMEN:  No tenderness.  EXTREMITIES:  No edema.   Electrocardiogram shows a sinus rhythm at a rate of 72.  There is a left  anterior fascicular block.  A prior septal infarct cannot be excluded.   DIAGNOSES:  1. Paroxysmal atrial fibrillation - Ms. Colvin is back in sinus rhythm.      These episodes may be increasing in frequency, but we will follow.      If they worsen in the future, then we could consider an      antiarrhythmic such as disopyramide given her history of      hypertrophic obstructive cardiomyopathy physiology.  We will      continue with the  Coumadin with goal INR of 2-3.  Note, I will      discontinue her lisinopril and increase her Cardizem to 360 mg p.o.      daily for rate control.  2. Coumadin therapy - this is being followed in [facility].  3. Hypertension - we are adjusting her blood pressure medications as      described above.  4. Hypothyroidism - per her primary care physician.  When we see her      back in 1 week, we will check a TSH and a BMP.  5. History of asymmetric septal hypertrophy with turbulence in the      left ventricular outflow tract.  We are increasing her calcium      blocker, and I will plan to repeat her echocardiogram as well.   We will see her back in 3 months.  Madolyn Frieze Jens Som, MD, [facility]  Electronically Signed    BSC/MedQ  DD: 03/01/2008  DT: 03/02/2008  Job #: [id number]

## 2011-04-20 NOTE — Assessment & Plan Note (Signed)
[facility]                            CARDIOLOGY OFFICE NOTE   NAME:Gieselman, Pamella Pert                          MRN:          [id number]  DATE:10/09/2007                            DOB:          08-04-1922    Mrs. Boyson is a very pleasant female who has a history of paroxysmal  atrial fibrillation as well as hypertrophic obstructive cardiomyopathy  physiology. Since I last saw her, she is about the same. She has some  dyspnea on exertion and fatigue, but there is no orthopnea, PND, pedal  edema, chest pain or syncope. She did have one brief episode of atrial  fibrillation about two months ago with palpitations for 30 minutes.  These resolve spontaneously.   CURRENT MEDICATIONS:  1. Hydrochlorothiazide 25 mg p.o. daily.  2. Ocuvite two p.o. b.i.d.  3. Synthroid 25 mcg p.o. daily.  4. Coumadin as directed.  5. Cardizem CD 180 mg p.o. daily.  6. Lisinopril 20 mg p.o. daily.   PHYSICAL EXAMINATION:  Shows a blood pressure of 148/50, pulse 67. She  weighs 142 pounds.  HEENT: Is normal.  NECK: Supple.  CHEST: Clear.  CARDIOVASCULAR: Reveals a regular rate and rhythm. There is a 2/6  systolic murmur heard at the left sternal border and it increases with  Valsalva.  ABDOMEN: Benign.  EXTREMITIES: Show no edema.   Electrocardiogram today shows a sinus bradycardia at a rate of 67. Prior  septal infarct cannot be excluded.   DIAGNOSES:  1. Paroxysmal atrial fibrillation. Mrs. Petrak had one brief episode of      palpitations in the past six months. These appear to be infrequent      and I think acceptable at this point. If they worsen in the future      we could consider adding anti-arrhythmic. We might consider      disopyramide given her history of hokum physiology. We will      continue with her Coumadin at 2-3 and Cardizem 180 mg p.o. daily.  2. Coumadin therapy. This is being followed at [facility] with a      goal of 2-3.  3. Hypertension. Her  blood pressure appears to be adequately      controlled.  4. Hypothyroidism. Per primary care physician.  5. Asymmetric septal hypertrophy with turbulence in the OVOT, she will      continue on her calcium blocker and we will most likely repeat her      echocardiogram when I see her back in six months.   Note, Dr. Hetty Ely is following her labs including her CBC and renal  function. We will see her back as described above.     Madolyn Frieze Jens Som, MD, [facility]  Electronically Signed    BSC/MedQ  DD: 10/09/2007  DT: 10/09/2007  Job #: [id number]   cc:   Arta Silence, MD

## 2011-04-20 NOTE — Assessment & Plan Note (Signed)
[facility]                            CARDIOLOGY OFFICE NOTE   NAME:[name] [name] Pert                          MRN:          [id number]  DATE:05/16/2008                            DOB:          1922/01/14    [name] is a pleasant 77 year old female who has a history of  paroxysmal atrial fibrillation and hypertrophic obstructive  cardiomyopathy physiology.  I last saw her in March and she had had an  episode of atrial fibrillation.  However, she was in sinus rhythm at  that time.  We had scheduled her to have an echocardiogram which was  performed on March 19, 2008.  The LV function was vigorous.  There was a  late-peaking signal that suggested mid cavitary obliteration in systole,  but the gradient was not high per Dr. Henrietta Hoover reading.  There was mild  LVH.  There was mild aortic and mitral regurgitation as well as mild  left atrial enlargement.  We also checked a followup TSH which was  normal.  Since I last saw her, she is doing well.  She has mild dyspnea  on exertion with more strenuous activities.  There is no orthopnea, PND,  pedal edema, palpitations, presyncope, syncope, or chest pain.  Note, we  did increase her Cardizem previously but this made her feel tired and  weak.  She decreased it back to the previous dose.   PRESENT MEDICATIONS:  1. HCTZ 25 mg p.o. daily.  2. Synthroid 25 mcg p.o. daily.  3. Coumadin as directed.  4. Cardizem 180 mg p.o. daily.  5. Lisinopril 20 mg p.o. daily.  6. PreserVision.  7. Geritol.   PHYSICAL EXAMINATION:  Today, shows a blood pressure of 139/59 and a  pulse of 64.  She weighs 139 pounds.  HEENT:  Normal.  NECK:  Supple.  CHEST:  Clear.  CARDIOVASCULAR:  Regular rhythm.  There is a 2/6 systolic murmur at the  left sternal border that increases with Valsalva.  ABDOMEN:  No tenderness.  EXTREMITIES:  No edema.   DIAGNOSES:  1. Paroxysmal atrial fibrillation - [name] is in sinus rhythm.  On  examination today, she has had no further palpitations.  We will      continue with her Cardizem at present dose as she did not tolerate      higher doses.  She will also continue on Coumadin with goal INR of      2-3.  This is being managed at [facility].  If  she has recurrent      or more frequent atrial fibrillation in the future then we could      consider adding an antiarrhythmic such as disopyramide.  2. Hypertension - blood pressure is adequately controlled on her      present medications.  3. Hypothyroidism - she will continue on her Synthroid.  4. Hypertrophic obstructive cardiomyopathy - she will continue on her      Cardizem and this does not appear to be significantly symptomatic      at this point.   She will see  [place] back in 6 months.     Madolyn Frieze Jens Som, MD, [facility]  Electronically Signed    BSC/MedQ  DD: 05/16/2008  DT: 05/17/2008  Job #: [id number]   cc:   Arta Silence, MD

## 2011-04-20 NOTE — Op Note (Signed)
[name], [name] NO.:  [id number]   MEDICAL RECORD NO.:  [id number]          PATIENT TYPE:  INP   LOCATION:  2011                         FACILITY:  [facility]   PHYSICIAN:  Claude Manges. Whitfield, M.D.DATE OF BIRTH:  02/21/1922   DATE OF PROCEDURE:  04/20/2009  DATE OF DISCHARGE:                               OPERATIVE REPORT   PREOPERATIVE DIAGNOSIS:  Displaced inferior pole right patellar  fracture.   POSTOPERATIVE DIAGNOSIS:  Displaced inferior pole right patellar  fracture.   PROCEDURE:  Open reduction and internal fixation.   SURGEON:  Claude Manges. Cleophas Dunker, MD   ASSISTANT:  Oris Drone. Petrarca, PAC   ANESTHESIA:  General.   COMPLICATIONS:  None.   PROCEDURE IN DETAIL:  The patient was taken to the operating room #5 and  placed under general anesthesia by Dr. Randa Evens.  The right lower  extremity was placed on a thigh tourniquet.  The right leg was then  prepped with Betadine scrub and Betadine solution from the tourniquet to  the ankles, sterile draping was performed.  With the extremity still  elevated, was Esmarch exsanguinated with a proximal tourniquet at 300  mmHg.   A midline longitudinal incision was made beginning at the superior pouch  extending across the midportion of the patella to the tibial tubercle  via sharp dissection, incision was carried down to the subcutaneous  tissue.  First layer of capsule was incised in the midline, at which  point we encountered gross blood clot and unclotted blood.  I could  easily separate the first layer of capsule medially and laterally.  I  inserted self-retaining retractors.   The fracture involved the inferior pole of the patella.  It was an  oblique fracture with more bony patella noted medially than laterally.  There was still articular cartilage remaining on the inferior pole  piece, but was nondisplaced at least an inch to an inch and half.  The  patient was able to extend her knee preoperatively.   There also was a  laceration of the capsule medially and laterally.   I irrigated the wound, removed any grossly clotted blood and at that  point, we reapproximated the inferior pole to the main portion of the  patella and maintained with bone clamps medially and laterally, I could  then palpate the articular surface and felt that it was essentially  anatomic.   Because of the poor condition of her osteopenic bone, we elected to use  FiberWire rather than screws or wires.  A number#2 fiber wire was then  placed through the inferior pole of the patella through the fracture  surface and then woven around the patella tendon and then back through  the bone.  We then obtained 2 drill holes along the medial patella and  then extended the suture to the superior pole of the patella through the  drill holes.  In similar fashion, we did the same along the lateral  aspect of the patellar fracture and patellar tendon.  Maintaining  position of the fracture with a bone clamp, we then sutured  the  FiberWire along the superior pole of the patella.  With the sutures  still in place, we then brought the suture down and sutured them through  the patellar tendon and patellar junction inferiorly in a figure-of-  eight manner.  A separate circumferential FiberWire was then placed  around the patella using a Mellody Dance needle.  At that point, I could flex  the knee at least 90 degrees and there was absolutely no separation of  the fracture.   Wound was irrigated with saline solution.  The capsule was closed  medially and laterally with interrupted #2 Ethibond.  The wound was  again irrigated with saline solution.  The tourniquet was deflated.  There was very minimal bleeding.  We did inject Marcaine with  epinephrine.  The capsule was closed.  The superficial capsule and subcu  were closed with a 0 Vicryl, skin closed with skin clips.  Sterile bulky  dressing was applied followed by a knee immobilizer without  aluminum  stay.   The patient tolerated the procedure without complication and was  returned to the post anesthesia recovery room without incident.      Claude Manges. Cleophas Dunker, M.D.  Electronically Signed     PWW/MEDQ  D:  04/20/2009  T:  04/21/2009  Job:  [id number]

## 2011-04-20 NOTE — Consult Note (Signed)
Cindy [name], QUEENER NO.:  [id number]   MEDICAL RECORD NO.:  [id number]          PATIENT TYPE:  INP   LOCATION:  2011                         FACILITY:  [facility]   PHYSICIAN:  Reinaldo Meeker, M.D. DATE OF BIRTH:  27-Aug-1922   DATE OF CONSULTATION:  04/19/2009  DATE OF DISCHARGE:                                 CONSULTATION   PRIMARY DIAGNOSIS:  Blunt trauma secondary to fall with C2 fracture.   HISTORY:  [name] is an 77 year old female who poorly fell forward  while going down some steps off of her deck.  She landed on her face and  front of her body sustaining multiple facial bruises.  She was noted to  have a C2 fracture on workup and neurosurgical consultation was  requested.  At this time, the patient is awake, alert and oriented with  normal strength and sensation.  She follows complex commands well and  verbalizes fluently and appropriately.   Her CT scan of the neck is reviewed.  This shows a linear nondisplaced  fracture through the ring of C2 just lateral on the left side to the  odontoid.  This is not the vicinity of the foramen transverse area and  should not be near the vertebral artery.   IMPRESSION:  Nondisplaced C2 fracture.  I believe this should heal with  bracing.  She is presently in a [employer] which is a good choice,  but she needs one that fits better and this has been arranged by the  Trauma Service.  She is on Coumadin, and most likely while she is having  this traumatic event, she should be off that for a few weeks.  I will be  happy to follow the patient as an outpatient after she is released from  the hospital and feel free to contact us if any issues arise while she  is hospitalized.           ______________________________  Reinaldo Meeker, M.D.     ROK/MEDQ  D:  04/19/2009  T:  04/20/2009  Job:  [id number]

## 2011-04-20 NOTE — Assessment & Plan Note (Signed)
[facility]                            CARDIOLOGY OFFICE NOTE   NAME:Cindy [name]                          MRN:          [id number]  DATE:11/13/2008                            DOB:          1922/03/18    Cindy [name] is a pleasant female who has a history of paroxysmal atrial  fibrillation and hypertrophic obstructive cardiomyopathy physiology.  Her last echocardiogram was performed on March 19, 2008 showed mid  cavity obliteration in systole.  Ejection fraction was 70%.  The  gradient was felt to be not high by Dr. Myrtis Ser.  She also had mild aortic  insufficiency and mitral regurgitation.  There was also mild tricuspid  regurgitation.  Since I last saw her, she is having problems with  arthralgias.  However, she denies any dyspnea, chest pain, palpitations,  or syncope.  There is no pedal edema.  She did state that she caught her  foot on the stair yesterday and fell on her right side.  She tried to  stand up by pulling with her left arm and subsequently developed mild  ecchymosis over the left shoulder.  She did not hit that area.  She  otherwise had no trauma.   MEDICATIONS:  1. Hydrochlorothiazide 25 mg p.o. daily.  2. Synthroid 25 mcg p.o. daily.  3. Coumadin as directed.  4. Cardizem 180 mg p.o. daily.  5. Lisinopril 20 mg p.o. daily.  6. Preservation.  7. Geritol.  8. Ocuvite.   PHYSICAL EXAMINATION:  VITAL SIGNS:  Today shows a blood pressure of  140/70 and her pulse is 67.  She weighs 139 pounds.  HEENT:  Normal.  NECK:  Supple.  CHEST:  Clear.  CARDIOVASCULAR:  Regular rhythm.  ABDOMEN:  No tenderness.  EXTREMITIES:  Her left shoulder shows mild ecchymosis, but there is no  obvious pain with palpation or dislocation.  No edema.   Electrocardiogram shows a sinus rhythm at a rate of 67.  There are  occasional PACs.  There is a left anterior fascicular block.  There is a  prior anterior infarct.  It is unchanged compared to previous.   DIAGNOSES:  1. Paroxysmal fibrillation - Cindy [name] remains in sinus rhythm today.      We will continue with her Cardizem as well as Coumadin with a goal      international normalized ratio of 2-3.  She did fall yesterday, but      this has not been a problem in the past.  This sounds as though she      merely tripped.  If she has recurrent falls in the future, then we      will need to consider discontinuing her Coumadin.  If she has      recurrent or more frequent atrial fibrillation in the future, then      we will consider an antiarrhythmic such as disopyramide, given her      hypertrophic obstructive cardiomyopathy physiology.  2. Hypertension - Her blood pressures are adequately controlled on      present medications.  I will check a  BMET to follow her potassium      and renal function.  3. Coumadin therapy - We will check a CBC.  Her international      normalized ratio is being [facility] at [facility].  4. Hypothyroidism.  5. Hypertrophic obstructive cardiomyopathy physiology - She will      continue on her Cardizem.   I will see her back in 6 months.     Madolyn Frieze Jens Som, MD, [facility]  Electronically Signed    BSC/MedQ  DD: 11/13/2008  DT: 11/13/2008  Job #: [id number]   cc:   Arta Silence, MD

## 2011-04-20 NOTE — Discharge Summary (Signed)
NAMELILYA, SMITHERMAN NO.:  [id number]   MEDICAL RECORD NO.:  [id number]          PATIENT TYPE:  INP   LOCATION:  5007                         FACILITY:  [facility]   PHYSICIAN:  Gabrielle Dare. Janee Morn, M.D.DATE OF BIRTH:  January 01, 1922   DATE OF ADMISSION:  04/18/2009  DATE OF DISCHARGE:  04/25/2009                               DISCHARGE SUMMARY   Patient is being discharged to a skilled nursing facility.   CONSULTANTS:  1. Dr. Cleophas Dunker, Orthopedic Surgery.  2. Dr. Gerlene Fee, Neurosurgery.   DISCHARGE DIAGNOSES:  1. Status post fall from a deck.  2. Multiple facial lacerations.  3. C2 lateral mass fracture.  4. Right patellar fracture.  5. Right hand laceration.  6. History of chronic atrial fibrillation, on chronic Coumadin      secondary to this.  7. Hypertension.  8. Hypothyroidism.  9. Allergic rhinitis.  10.Acute blood loss anemia, stable currently with a hemoglobin of 8.4      this morning.   PROCEDURES:  1. Closure multiple facial lacs on Apr 19, 2009.  2. ORIF right displaced patellar fracture, Dr. Cleophas Dunker, on Apr 20, 2009.   Patient was transfused 2 units FFP on Apr 19, 2009, to correct her INR.   HISTORY ON ADMISSION:  This is an 77 year old female who fell off a deck  landing on her face.  She did not have either a syncopal episode or loss  of consciousness.  She was brought in as a level 2 trauma alert.  She  had chronically been on Coumadin for atrial fib and her INR on admission  was 2.5.  She had lacerations over her face and laceration over her  right hand.  She was complaining of some pain in her right knee.   Workup at this time including a chest x-ray showed no acute  abnormalities.  CT scan of the head, question a very tiny right frontal  contusion.  CT scan of the C-spine showed a C2 lateral mass fracture.  Plain films of the right knee showed a displaced inferior pole patella  fracture with a wide gap.  The patient also had a large  hemarthrosis  associated with this.  This.   She was admitted to a step-down unit initially.  She was seen in  consultation per Dr. Gerlene Fee who recommended a cervical collar and  conservative treatment for her nondisplaced C2 fracture.  It was felt  that she will likely need to remain in this 10 to 12 weeks.  She will  need to follow up with Dr. Gerlene Fee following her discharge.   She was seen in consultation by Dr. Cleophas Dunker of Orthopedics and it was  felt she would need ORIF of her right patella.  Her INR was corrected  and the patient was taken to the OR on Apr 20, 2009, for ORIF of her  displaced inferior pole patellar fracture without intraoperative  complication.  She has been maintained in a knee immobilizer  weightbearing as tolerated.  She is ambulatory with a rolling walker for  moderate distance  with supervision.  She does need a lot of assistance  with transfers and basic activities of daily living and it was  recommended that she be admitted temporarily to an inpatient skilled  nursing facility and arrangements have been made for this to be done at  this time.  She did develop moderate acute blood loss anemia and her  hemoglobin most recently has remained stable at 8.4, hematocrit of 25.0.  Platelets are 231,000.  White blood cell count of 4700.  Her INR is now  in the therapeutic range at 2.3.  Her Coumadin was resumed  postoperatively and as she was covered with Lovenox until her INR was  therapeutic again.   She has otherwise had no untoward events.  She is being discharged in  stable, improved condition to skilled nursing.   MEDICATIONS AT THE TIME OF DISCHARGE:  Include:  1. Coumadin 5 mg every Tuesday, Thursday, Saturday, Sunday.  Coumadin      2.5 mg every Monday, Wednesday, Friday.  2. Lisinopril 20 mg p.o. daily.  3. Claritin 10 mg p.o. daily.  4. Diltiazem CD 180 mg p.o. daily.  5. Levothyroxine 25 mcg daily.  6. Colace 100 mg b.i.d.  7. Dulcolax  Suppositories p.r.n.  8. Norco 5/325 mg 1 to 2 tablets every 4 hours p.r.n. pain, #40, no      refill.  9. Benadryl 25 mg q.h.s. p.r.n. sleep.  10.Ensure Supplements b.i.d.   She is to wear a right knee immobilizer and cervical collar at all times  except for hygiene.  She is to have a CBC, PT/INR every 3 days.  She  should follow up with Dr. Cleophas Dunker next week.  Arrangements need to be  made for this appointment.  She is to follow up with Dr. Gerlene Fee in 3 to  4 weeks.  Arrangements need to be made for this appointment.   Her diet is regular.  She does not require formal followup with Trauma  Service but you can call for questions or concerns at [phone].      Shawn Rayburn, P.A.      Gabrielle Dare Janee Morn, M.D.  Electronically Signed    SR/MEDQ  D:  04/25/2009  T:  04/25/2009  Job:  [id number]   cc:   Claude Manges. Cleophas Dunker, M.D.  Reinaldo Meeker, M.D.  [facility]

## 2011-04-20 NOTE — H&P (Signed)
Cindy [name], ABRAHA NO.:  [id number]   MEDICAL RECORD NO.:  [id number]          PATIENT TYPE:  INP   LOCATION:  2916                         FACILITY:  [facility]   PHYSICIAN:  Sandria Bales. Ezzard Standing, M.D.  DATE OF BIRTH:  [birth year]   DATE OF ADMISSION:  04/18/2009  DATE OF DISCHARGE:                              HISTORY & PHYSICAL   Date of admission - 18 Apr 2009   HISTORY OF ILLNESS:  This is an 77 year old white female who fell off  the deck, landed on her face, did not have either a syncopal episode or  loss of consciousness, presented to the [facility] Emergency Room as a  silver trauma.  She has not had a fall this bad before.  Her husband and  two of her sons are in the room with her when I examined her.   Her big complaint now is right knee pain and nausea.   She sees Dr. Laurita Quint, who is her primary medical doctor.   PAST MEDICAL HISTORY:  She has no allergies.   CURRENT MEDICATIONS:  1. Hydrochlorothiazide.  2. Levothroid.  3. Lisinopril.  4. Fexofenadine.  5. Diltiazem.  6. She takes Coumadin.   REVIEW OF SYSTEMS:  NEUROLOGIC:  No seizure or loss of consciousness.  PULMONARY:  No history of pneumonia or tuberculosis.  Does not smoke  cigarettes.  CARDIAC:  She see Dr. Jens Som.  From a cardiac standpoint, has atrial  fibrillation and is on Coumadin, but has had no heart attack, cardiac  catheterization.  GASTROINTESTINAL:  No history of peptic ulcer disease,  liver disease, or prior abdominal surgery.  UROLOGIC:  No kidney stones or kidney infections.  GYN:  She sees Hilbert Bible from a GYN standpoint.   Her husband, Cindy [name], is at the bedside.  She has sons, Cindy [name] and Cindy [name], also  at the bedside.   PHYSICAL EXAMINATION:  VITAL SIGNS:  Her temperature is 97.4, pulse is  69, her blood pressure 172/65.  She is sating at 97%.  HEENT:  She has laceration on her forehead (2 cm), a laceration on the  bridge of her nose (2 cm) , and laceration on her  upper lip (1 cm).  There is no obvious bony deformities though she has some swelling  underneath these lacerations.  Pupils are equal and reactive to light.  She sees grossly fingers and  her extraocular movements x6.  Her ear canals, I can see, but I cannot  make out her TMs on either ear.  She has no obvious tooth fracture or  oral injury.  NECK:  She is in a cervical collar and I did not try to move her out of  the collar because of her history of a fracture.  LUNGS:  Clear to auscultation with symmetric breath sounds.  HEART:  She has a 3/6 systolic murmur and some irregular rhythm.  ABDOMEN:  Soft without tenderness or injury.  PELVIS:  Stable.  EXTREMITIES:  She has a laceration over her right hand.  She has no  obvious bony deformity.  She  has a deformity of her right knee where she  cannot bend her knee.  Pedal pulses are intact in both feet.  NEUROLOGIC:  Grossly intact to upper and lower extremities.  She can  move her toes on her right foot and she has gross sensation in the right  foot.   LABORATORY DATA:  Labs that I have show a sodium 134, potassium 3.5,  chloride of 101, CO2 of 25, BUN of 17, creatinine 0.7.  Her hemoglobin is 12, hematocrit 36, white blood count of 7300.  Her PT  is 20, INR 2.5.  Her chest x-ray is pending at the time of dictation, also I do not have  an EKG.  CT scan head and neck, reviewed with Radiology with Ulyses Southward,  she has a questionable left frontal contusion which is always seen only  on one view.  She has a C2 left lateral mass fracture.   IMPRESSION:  1. Fall with no loss of consciousness.  2. C2 fracture,  in cervicle collar.  Dr. Aliene Beams will see her      on a non-urgent basis.  3. Right knee/patella fracture, Dr. Cleophas Dunker will see her on a non-      urgent basis.  4. Lacerations of face and right hand which will be closed in the ER.  5. Anticoagulation.  6. Atrial fibrillation.  7. Hypertension.  8. Questionable left  frontal contusion on CT scan.  9. Thyroid replacement.      Sandria Bales. Ezzard Standing, M.D.  Electronically Signed     DHN/MEDQ  D:  04/18/2009  T:  04/19/2009  Job:  [id number]   cc:   Claude Manges. Cleophas Dunker, M.D.  Reinaldo Meeker, M.D.  Arta Silence, MD  Madolyn Frieze. Jens Som, MD, [facility]

## 2011-04-23 NOTE — Op Note (Signed)
[facility]  Patient:    Cindy [name], Cindy [name]                          MRN: [id number] Proc. Date: 09/07/00 Adm. Date:  [id number] Attending:  Malon Kindle                           Operative Report  PREOPERATIVE DIAGNOSES:  Abnormal uterine bleeding in a post menopausal patient on estrogen replacement therapy with benign endometrial biopsy.  POSTOPERATIVE DIAGNOSES:  Abnormal uterine bleeding in a post menopausal patient on estrogen replacement therapy with benign endometrial biopsy. Endometrial polyps.  OPERATION PERFORMED:  Dilation and curettage, hysteroscopic removal of endometrial polyps.  SURGEON:  Dr. Ambrose Mantle.  ANESTHESIA:  General.  DESCRIPTION OF PROCEDURE:  The patient was brought to the operating room and placed under satisfactory general anesthesia. She was placed in the Newberry stirrups and in lithotomy position. The vulva, vagina and perineum were prepped with Betadine solution, draped as a sterile field an the cervix was exposed. The uterus sounded to about 8 cm anteriorly and slightly to the left. The cervical canal was dilated to a 29 Pratt dilator. The hysteroscope was introduced and there was a polyp low in the endometrial cavity or high in the cervical canal on the right that was removed. This was a large polyp probably 1 cm in length. There was also a smaller polyp up at the right uterine cornual area that was removed in several pieces. This was a much smaller polyp. Other than this, there were a couple of fragments of endometrial tissue and possibly a sessile polyp on the posterior aspect of the uterine wall but this was not for sure. I did try to take some biopsies of the posterior wall possible polyp and whether it was endometrial tissue or polyp I am not sure. I then did a fractional D&C, used the polyp forceps to try to remove any loose tissue that was present in both the endocervix and the endometrial cavity and  the procedure was terminated for the endometrium. There was a 1 cm lesion that was epithelialized on the posterior fornix right at the cervicovaginal junction. I removed this and placed a 2-0 suture on it. All the specimens were preserved and sent to pathology. There was about 150 cc sorbitol deficit. The patient tolerated the procedure well and was returned to recovery in satisfactory condition. DD:  09/07/00 TD:  09/07/00 Job: [id number]

## 2011-04-23 NOTE — Op Note (Signed)
NAME:  Cindy [name], Cindy [name]                             ACCOUNT NO.:  [id number]   MEDICAL RECORD NO.:  [id number]                   PATIENT TYPE:  AMB   LOCATION:  NESC                                 FACILITY:  [facility]   PHYSICIAN:  Ronald L. Ovidio Hanger, M.D.           DATE OF BIRTH:  January 08, 1922   DATE OF PROCEDURE:  09/17/2003  DATE OF DISCHARGE:                                 OPERATIVE REPORT   PREOPERATIVE DIAGNOSIS:  Type 3 stress urinary incontinence.   OPERATION/PROCEDURE:  TVT cystourethroscopy.   SURGEON:  Lucrezia Starch. Earlene Plater, M.D.   ANESTHESIA:  LMA.   ESTIMATED BLOOD LOSS:  75 mL.   TUBES:  16-French Foley catheter.   COMPLICATIONS:  None.   INDICATIONS FOR PROCEDURE:  Cindy [name] is a lovely 77 year old white female  who basically presents with significant stress urinary incontinence.  She  notes that she has had incontinence for years but over the last two years it  has progressively worsened, constantly drips.  When she coughs or sneezes,  it is terrible.  She wears multiple pads and wishes to have something done  about it.  She had an anterior urethropexy in the 1980 silk, basically came  in for evaluation.   Cystogram revealed some descensus just to the lower level of the inferior  border of the pubic symphysis.  There were some beaking noted and a defect  from the prior urethropexy and on urodynamic evaluation, she was found to  have essentially normal neurologic function with a very low leak point  pressure at 40 cm of water consistent with type 3 stress urinary  incontinence.  After understanding all risks, benefits and alternatives, she  elected to proceed with TVT.   DESCRIPTION OF PROCEDURE:  The patient was placed in the supine position and  after proper LMA anesthesia, was placed in the dorsal lithotomy position,  prepped and draped with Betadine in the sterile fashion.  A vaginal incision  was made after approximately 8 mL of 1% Xylocaine with epinephrine had  been  injected in the submucosally.  Incision was made at the urethrovesical  junction.  Vaginal flaps were created, one fingertip depth and endopelvic  fascia was palpated bilaterally.  Punch holes were obtained approximately  one fingerbreadth superior and two fingerbreadths medial to the pubis at the  midline and two fingerbreadths lateral, and the TVT needles were passed to  finger palpation bilaterally.  Cystourethroscopy was then performed on the  right side.  A 16-French Foley catheter had been placed previously and the  bladder drained.  On the right side, there was a small tangential puncture  from the needle.  This was removed and replaced.  Extravesical and was noted  to be in good position.  No significant bleeding was noted to be present.  The bladder was then drained.  The TVT was delivered in position.  Reinspection revealed there were  no perforations and the tape was deployed  with the deploying sheath.  This sling was noted to be in good position.  There was no undue tension.  It was completely deployed and I was  comfortable with the tension.  Reinspection of the bladder again with a  cystoscope with the 12-degree and 70-degree lenses revealed no further  perforations.  The bladder was fully distended and no bleeding was noted.  Efflux of clear urine was noted from the normally placed ureteral orifices  bilaterally.  The urethra was intact.  The bladder was drained.  The  panendoscope was removed.  The 16-French catheter was replaced.  The vagina  flap was closed which was approximately 1 cm in length.  It was closed with  a running 2-0 Vicryl suture.  The tapes were cut at the skin level and  covered with Tegaderm.  A vaginal pack was placed and the patient was taken  to the recovery room stable.                                               Ronald L. Ovidio Hanger, M.D.    RLD/MEDQ  D:  09/17/2003  T:  09/17/2003  Job:  [id number]

## 2011-04-23 NOTE — Op Note (Signed)
[name], [name] NO.:  [id number]   MEDICAL RECORD NO.:  [id number]          PATIENT TYPE:  AMB   LOCATION:  DAY                          FACILITY:  [facility]   PHYSICIAN:  Ronald A. Gioffre, M.D.DATE OF BIRTH:  11-22-1922   DATE OF PROCEDURE:  08/31/2005  DATE OF DISCHARGE:                                 OPERATIVE REPORT   SURGEON:  Georges Lynch. Darrelyn Hillock, M.D.   ASSISTANT:  Jamelle Rushing, P.A.   PREOPERATIVE DIAGNOSES:  1.  Complete tear of the rotator cuff tendon on the right.  2.  Severe impingement syndrome on the right shoulder.   POSTOPERATIVE DIAGNOSES:  1.  Complete tear of the rotator cuff tendon on the right.  2.  Severe impingement syndrome on the right shoulder.   OPERATION:  1.  Partial acromionectomy and acromioplasty, right shoulder.  2.  Repair of a complete degenerative disrupted tear of the rotator cuff      tendon on the right utilizing a Restore tendon graft with two four-      pronged Mitek sutures.   DESCRIPTION OF PROCEDURE:  Prior to the general anesthetic while the patient  was in the holding area, she was given an interscalene nerve block on the  right. This time, the patient had 1 gram of IV Ancef. The patient then was  taken back to surgery. She had a general anesthetic and a routine orthopedic  prep and draping of the right shoulder was carried out. Following this, an  incision was made over the anterior aspect of the right shoulder, bleeders  identified and cauterized. I then detached the muscle from the acromion. She  had a severe overgrowth of her acromion. I did a partial acromionectomy and  acromioplasty utilizing the oscillating saw on the bur. At this time, I  identified the rotator cuff. Her rotator cuff was severely degenerative and  torn, there was multiple tears in multiple areas there. I first tried to  reinforce the proximal part by reinforcing the tendon with the long head of  the biceps tendon. After this was  carried out, I then utilized the bur to  bur down the lateral articular surface of the humerus and I then inserted  two four-pronged Mitek sutures into the proximal humerus and then applied  the Restore tendon graft and I sutured it down in place. I thoroughly  irrigated out the area and as I mentioned, we first did the partial  acromionectomy with the oscillating saw and then we utilized the bur to even  the undersurface of the acromion in order to re-maintain the subacromial  space. I then after that irrigated the area out and then did my tendon  repair. I then inserted some Gelfoam up in the subacromial space,  reapproximated the deltoid tendon to the acromion and  then closed the remaining muscle with #0 Vicryl. The subcu was closed with  #0 Vicryl, skin was closed with metal staples and sterile Neosporin dressing  was applied and she was placed in a shoulder immobilizer. The patient left  the operating room  in satisfactory condition.           ______________________________  Georges Lynch Darrelyn Hillock, M.D.     RAG/MEDQ  D:  08/31/2005  T:  08/31/2005  Job:  [id number]

## 2011-04-23 NOTE — H&P (Signed)
NAMEDINISHA, CAI NO.:  [id number]   MEDICAL RECORD NO.:  [id number]          PATIENT TYPE:  EMS   LOCATION:  MAJO                         FACILITY:  [facility]   PHYSICIAN:  Willow Ora, MD           DATE OF BIRTH:  1922/06/21   DATE OF ADMISSION:  08/28/2006  DATE OF DISCHARGE:                                HISTORY & PHYSICAL   PRIMARY CARE DOCTOR:  Laurita Quint, M.D. at [facility].   CHIEF COMPLAINT:  Shortness of breath.   HISTORY OF PRESENT ILLNESS:  Ms. Pal is an 77 year old female who was in  her usual state of health until last night when she noticed some shortness  of breath and sweating.  She checked her own blood pressure and the machine  read 190/90 with a pulse of 87.  Later on, she felt that her pulse was real  high and the machine read her pulse as error.  That is when she came to  the emergency room.  In the emergency room, the first EKG showed atrial  fibrillation with a heart rate of 74.  Repeated EKG showed normal sinus  rhythm.  Otherwise, there is no evidence of acute ischemic changes.  She has  been admitted with new onset of atrial fibrillation that most likely was  symptomatic.   PAST MEDICAL HISTORY:  1. Hypertension.  2. History of goiter and hypothyroidism.  3. History of shoulder surgery last year.  4. History of surgery for urinary incontinence.  5. The patient reports a stress test 1 or 3 years.   FAMILY HISTORY:  Noncontributory.   SOCIAL HISTORY:  Does not smoke or drink.  She lives with her husband in her  own house.  All of her activities of daily living are independent.  She  still drives her car.   REVIEW OF SYMPTOMS:  She denies any fever, nausea, vomiting, or diarrhea.  No chest pain, just chest tightness.  No dysuria.  No lower extremity edema  or leg pain.  She has not been taking any over-the-counter medications.   MEDICINES:  1. Norvasc 5 mg 1 p.o. daily.  2. Celebrex p.r.n.  3. Hydrochlorothiazide,  dose unknown at this time.  4. Lopressor, dose unknown at this time.  5. Synthroid, dose unknown.   ALLERGIES:  No known drug allergies.   PHYSICAL EXAMINATION:  GENERAL:  The patient is a very nice lady.  She is  afebrile and pulse is 84.  She is alert, oriented, in no apparent distress.  VITAL SIGNS:  Blood pressure 117/65.  O2 sat was 98%.  NECK:  Normal carotid pulses and no JVD.  LUNGS:  Clear to auscultation bilaterally.  CARDIOVASCULAR:  Regular rate and rhythm at this point.  ABDOMEN:  Not distended, soft, good bowel sounds.  No organomegaly.  EXTREMITIES:  No edema.  NEUROLOGIC:  Speech is normal.  Motor is intact.  External ocular movements  are normal.   LABORATORY AND X-RAYS:  BNP is increased to 352;  I do not know her  baseline.  Cardiac enzymes x1 is negative.  CT of the chest showed no acute  changes.  No PE.  White count 5.3.  Hemoglobin 14.3.  Platelets 212.  PT and  PTT are normal.  Urinalysis showed trace esterase  and white blood cells of  2.  Sodium 141, potassium 3.8, BUN 14, creatinine 0.7, blood sugar 112.  D-  dimer was mildly elevated to 0.6 but again the CT of the chest is negative.  I also noticed a recent TSH within normal limits.   ASSESSMENT AND PLAN:  1. The patient is admitted with new onset atrial fibrillation.  She is      back on normal sinus rhythm.  She will be kept from Telemetry.  We will      do  an echocardiogram  and a full thyroid function tests.  As far as      her medication, we need to clarify the strength of the medications that      she takes.  In the meantime,  I will continue Norvasc,      hydrochlorothiazide, and Lopressor.  I think that we can either send      her home tomorrow if she continues to do well or we will obtain in-      house cardiac consultation.  2. Hypertension.  This is stable.  3. Hypothyroidism.  We will order pulmonary function tests.      Willow Ora, MD  Electronically Signed     JP/MEDQ  D:   08/28/2006  T:  08/30/2006  Job:  [id number]   cc:   Arta Silence, MD

## 2011-04-23 NOTE — Assessment & Plan Note (Signed)
[facility]                            CARDIOLOGY OFFICE NOTE   NAME:Franzoni, Pamella Pert                          MRN:          [id number]  DATE:03/30/2007                            DOB:          07-Jun-1922    Mrs. Comes is a pleasant 77 year old female who has a history of  paroxysmal atrial fibrillation.  She had an echocardiogram performed on  August 29, 2006.  Her left ventricular function was vigorous with  mild LVH, and there was moderate asymmetric septal hypertrophy.  There  was turbulence due to increased transvalvular/LVOT flow.  There was mild  mitral regurgitation.  She also had a Myoview performed on October 21, 2006.  She had normal perfusion and normal left ventricular function.  There was soft tissue attenuation.  Since I last saw her, she apparently  states that her husband has been in the hospital with a bleeding ulcer,  requiring 10 years of blood transfusion, and also developed a pneumonia.  He has had a complicated course, which has strained her.  She has mild  dyspnea with more extreme activities, but not with routine activities  around the house.  There is no orthopnea, PND, pedal edema,  palpitations, pre-syncope, syncope, or chest pain.   MEDICATIONS:  1. Hydrochlorothiazide 25 mg p.o. daily.  2. Ocuvite 2 b.i.d.  3. Synthroid 25 mcg p.o. daily.  4. Coumadin as directed.  5. Cardizem 180 mg p.o. daily.  6. Lisinopril 20 mg p.o. daily.   PHYSICAL EXAM:  Blood pressure is 150/62, pulse is 72.  She weighs 138  pounds.  NECK:  Supple.  CHEST:  Clear.  CARDIOVASCULAR:  Regular rate and rhythm.  There is a 2/6 systolic  ejection murmur at the left sternal border.  EXTREMITIES:  No edema.   Her electrocardiogram shows a sinus rhythm at a rate of 72.  There are  occasional PACs noted.  There is a left anterior fascicular block and a  prior septal infarct cannot be excluded.   DIAGNOSES:  1. Paroxysmal atrial fibrillation -  she remains in sinus rhythm.  We      will continue with her Cardizem as well as Coumadin with a goal INR      of 2 to 3.  2. Coumadin therapy - followed at [facility].  3. Hypertension - blood pressure appears to be reasonably well-      controlled on the present medications.  4. Hypothyroidism - per primary care.   We will see her back in 6 months.     Madolyn Frieze Jens Som, MD, [facility]  Electronically Signed    BSC/MedQ  DD: 03/30/2007  DT: 03/30/2007  Job #: [id number]   cc:   Arta Silence, MD

## 2011-04-23 NOTE — Letter (Signed)
October 10, 2006    Madolyn Frieze. Jens Som, MD, [facility]  [address]  [address]  [place], [place] [address]   RE:  CHEYENE, HAMRIC  MRN:  [id number]  /  DOB:  [birth year]   Dear Arlys John:   This is to introduce Ava Collar, an 77 year old white female who is here  today for follow up of an ER visit over the weekend for recurrent atrial  fibrillation.  She was hospitalized in September for atrial fibrillation  which normalized spontaneously in the hospital.  Chest CT was negative for  clots or pulmonary embolus and she was felt to be a candidate for following  medically.  Then again this weekend was seen in the ER, had atrial  fibrillation which spontaneously resolved after 2 hours.  I did not start  her on Coumadin today but I am wondering whether that needs to be done and  whether she otherwise needs to be followed.  Her medical history is  significant for elevated cholesterol and triglycerides, degenerative joint  disease of the back, osteoporosis, hypertension, diverticulosis with  Barrett's esophagus, multinodular goiter and thyroid disease there from,  elevated glucose, not diabetic at this point and then her recent atrial  fibrillation.   Her medicines include:  1. Amlodipine 10 mg a day.  2. Celebrex 200 mg a day.  3. Hydrochlorothiazide 25 mg a day.  4. Ocuvite vitamins.  5. Toprol XL 25 mg b.i.d.  6. Enteric coated 81 mg aspirin which I changed to a 325 mg aspirin when      she came back from the hospital.  7. Synthroid 0.025 a day.  8. Methocarbamol p.r.n.   She is a real sweet lady who had rotator cuff surgery last year in October,  otherwise is reasonably stable, has been followed in the past with a  Cardiolite in October 2004 that was read as reasonably normal, ejection  fraction is 65% and an echo that showed diastolic dysfunction in October  2004, but otherwise normal with mild LVH.  I appreciate your seeing her.  I  look forward to your evaluation.  She was in sinus rhythm today  with a  bradycardia at 60 beats a minute.  Thank you very much.    Sincerely,     ______________________________  Arta Silence, MD    RNS/MedQ  DD: 10/10/2006  DT: 10/11/2006  Job #: [id number]

## 2011-04-23 NOTE — Discharge Summary (Signed)
Cindy [name] NO.:  [id number]   MEDICAL RECORD NO.:  [id number]          PATIENT TYPE:  INP   LOCATION:  3707                         FACILITY:  [facility]   PHYSICIAN:  Valerie A. Felicity Coyer, MDDATE OF BIRTH:  January 09, 1922   DATE OF ADMISSION:  08/28/2006  DATE OF DISCHARGE:  08/29/2006                                 DISCHARGE SUMMARY   DISCHARGE DIAGNOSE:  1. New onset atrial fibrillation.  2. Hypertension.   HISTORY OF PRESENT ILLNESS:  Cindy [name] is an 77 year old white female who  was admitted on August 28, 2006 with complaint of shortness of breath and  diaphoresis.  She noted on a BP self-check at home that her blood pressure  was 190/90 and her pulse was 87.  On recheck she noted that the pulse read  error and she felt that her heart rate was very fast.  She proceeded to the  ER and was found to be in atrial fibrillation with a rate of 74.  Repeat EKG  noted normal sinus rhythm.  The patient was admitted for further evaluation  and treatment.   PAST MEDICAL HISTORY:  1. Hypertension.  2. Hypothyroid with history of goiter.  3. Shoulder surgery September 2006.  4. Urinary incontinence status post bladder surgery.  5. Status post stress test 1-2 years ago.   COURSE OF HOSPITALIZATION:  NEW ONSET ATRIAL FIBRILLATION.  The patient was  admitted and self-converted back to normal sinus rhythm.  She was started on  a higher dose of beta blocker during this admission which was 50 mg of  Lopressor p.o. b.i.d. and was noted to have a heart rate of sinus brady in  the 50s.  She had previously been on Toprol XL 25 mg p.o. daily at home.  We  have recommended to the patient that she take a daily aspirin and we are  increasing her Toprol XL to 50 mg p.o. daily.   The patient did undergo serial cardiac enzymes which were negative.  Her TSH  was also within normal limits.  She was noted on admission to have an  elevated D-dimer and underwent a CT angio of the  chest which was negative  for PE.  It did note slight prominence and large mediastinal lymph nodes.   MEDICATIONS AT DISCHARGE:  1. Synthroid 25 mcg p.o. daily as before.  2. Celebrex 200 mg p.o. as needed.  3. Hydrochlorothiazide 12.5 mg p.o. daily.  4. Amlodipine 10 mg p.o. daily.  5. Toprol XL 25 mg tablets.  The patient is a two tablets daily and when      her 25 mg tablets run out she may start Toprol XL 50 mg p.o. daily.      Prescription provided for 90-day supply.  6. Enteric-coated aspirin 325 mg p.o. daily.   PERTINENT LABORATORIES AT DISCHARGE:  Serial cardiac enzymes negative x3.  TSH 1.234, hemoglobin 14.3, hematocrit 42, BUN 14, creatinine 0.7.   DISPOSITION AND PLAN:  Transfer the patient to home.  She is instructed  follow up with Dr. Hetty Ely in 1-2 weeks  and call his office for an  appointment.  She is also instructed to return to the emergency room should  she develop a rapid heart rate.     ______________________________  Sandford Craze, NP      Raenette Rover. Felicity Coyer, MD  Electronically Signed    MO/MEDQ  D:  08/29/2006  T:  08/30/2006  Job:  [id number]   cc:   Arta Silence, MD

## 2011-04-23 NOTE — Assessment & Plan Note (Signed)
[facility]                            CARDIOLOGY OFFICE NOTE   NAME:[name], [name]                          MRN:          [id number]  DATE:12/15/2006                            DOB:          1922/09/22    Cindy [name] returns to for follow-up care.  Please refer to my notes for  previous details.  We did perform a Myoview on her in November that  showed no ischemia and probable normal perfusion.  Her ejection fraction  was 78%.  Her Toprol was discontinued recently, and Cardizem was added  by Dr. Hetty Ely.  She has done will with no significant, chest pain or  palpitations.  Last Thursday, one day after starting her Cardizem, she  had an episode when she was standing at the sink and felt suddenly  dizzy.  She did not have frank syncope.  It lasted for several seconds  and resolved spontaneously.  She has had no further episodes since then.   MEDICATIONS:  1. Hydrochlorothiazide 25 mg p.o. daily.  2. Allegra 108 p.o. as needed.  3. Norvasc 10 mg p.o. q. day.  4. Ocuvite.  5. Toprol has been discontinued.  6. Synthroid 25 mcg p.o. q. day.  7. Coumadin.  8. Cardizem 180 mg p.o. daily.   PHYSICAL EXAMINATION:  VITAL SIGNS:  Shows a blood pressure of 159/64.  Pulse is 85.  CHEST:  Clear.  CARDIOVASCULAR:  Regular rate and rhythm.  EXTREMITIES:  Show no edema.   DIAGNOSES:  1. Paroxysmal atrial fibrillation.  2. Coumadin therapy.  3. Hypertension.  4. Hypertension.  5. Hypothyroidism.  6. Recent presyncopal episode.   PLAN:  Cindy [name] is doing reasonably well from a symptomatic  standpoint.  She is on 2 calcium blockers now.  Will discontinue her  Norvasc, and as stated add Lisinopril 20 mg p.o. q. day to control her  blood pressure.  She will need a BMET in 1 week, followup potassium and  renal function.  She will continue on Coumadin with goal INR of 2 to 3,  and this is being followed at Dr. Lorenza Chick office.  She did have a  recent  pre-syncopal episode.  I am concerned that she may have had  transient bradycardia, although I cannot document this.  We will  continue to  follow this expectantly.  If she has recurrent episodes in the future,  then we may need to consider a CardioNet monitor.  I will see her back  in 3 months.     Madolyn Frieze Jens Som, MD, [facility]  Electronically Signed    BSC/MedQ  DD: 12/15/2006  DT: 12/15/2006  Job #: [id number]   cc:   Arta Silence, MD

## 2011-04-23 NOTE — Assessment & Plan Note (Signed)
[facility]                              CARDIOLOGY OFFICE NOTE   NAME:[name], [name]                          MRN:          [id number]  DATE:10/19/2006                            DOB:          03/03/1922    Cindy [name] is a very pleasant 77 year old female with past medical history  of hypertension, hyperthyroid who we were asked to evaluate for recent onset  of atrial fibrillation.  The patient apparently developed a sudden onset of  palpitations associated with dyspnea in September.  There was also a mild  chest tightness.  She was admitted to [facility] on August 28, 2006 with these symptoms and was noted to be in atrial fibrillation per the  discharge summary.  I do not have an electrocardiogram available.  She  converted to normal sinus rhythm and her Toprol was increased to 50 mg p.o.  daily.  She was continued on aspirin.  Note she did have an echocardiogram  performed on August 29, 2006.  Her LV function was normal.  Estimated  ejection fractions 70% to 75%.  There was moderate asymmetric septal  hypertrophy.  There was diastolic dysfunction.  There was moderate MAC with  mild mitral regurgitation.  Note she had a chest CT also that showed no  pulmonary embolus and this was performed due to an elevated D-dimer.  Her  cardiac markers were negative.  Her TSH was normal at 1.234.  She apparently  over the weekend of November 4 had a second episode.  However, she  spontaneously converted to normal sinus rhythm and was discharged from the  emergency room.  Dr. Hetty Ely therefore asked [place] to evaluate.  Note when she  has not had atrial fibrillation she denies any dyspnea with exertion, no  orthopnea, PND, pedal edema or exertional chest pain.  There has been no  syncope.   Her medications at present include:  1. Hydrochlorothiazide 25 mg p.o. daily.  2. Reglan 180 mg p.o. daily as needed.  3. Norvasc 10 mg p.o. daily.  4.  Ocuvite.  5. Toprol 25 mg p.o. b.i.d.  6. Aspirin 325 mg p.o. daily.  7. Synthroid 25 mcg p.o. daily.   PAST MEDICAL HISTORY:  Significant for hypertension but she denies any  diabetes mellitus or hyperlipidemia.  She does have a history of goiter and  is now hypothyroid requiring Synthroid.  She has a recent history of atrial  fibrillation as described in the HPI.  She had an appendectomy and also  rotator cuff surgery.  She had breast reduction surgery and bladder tack.   She has no known drug allergies.   SOCIAL HISTORY:  She does not smoke nor does consume alcohol.   FAMILY HISTORY:  Is positive for congestive heart failure.   REVIEW OF SYSTEMS:  She denies any headaches, fevers, chills.  There is no  productive cough or hemoptysis.  There is no dysphagia, odynophagia, melena  or hematochezia.  There is no dysuria, hematuria, __________.  There is no  orthopnea, PND or pedal edema.  The remaining  systems are negative.   PHYSICAL EXAMINATION:  Shows a blood pressure of 158/62 and her pulse is 59.  She weighs 143 pounds.  She is well-developed and well-nourished in no acute distress.  SKIN:  Is warm and dry.  She does not appear depressed.  There is no peripheral clubbing.  HEENT:  Unremarkable with normal eyelids.  NECK:  Is supple with a normal upstroke bilaterally. Cannot appreciate  bruits.  There is no jugular venous distention and no thyromegaly is noted.  There is no adenopathy noted on the lymphatic exam.  CHEST:  Clear to auscultation on percussion.  CARDIOVASCULAR:  Reveals a regular rate and rhythm.  Normal S1 and S2.  She  had a 2/6 systolic ejection murmur at the left sternal border.  There is no  S3 or S4.  Her PMI is not displaced.  ABDOMEN:  Not tender or distended.  Positive bowel sounds, no  hepatosplenomegaly, no masses appreciated.  There is no abdominal bruit.  She has 2+ femoral pulses bilaterally. No bruits.  EXTREMITIES:  Showed no edema.  I could  palpate no cords.  She has 2+  dorsalis pedis pulses bilaterally.  NEUROLOGIC:  Cranial nerves II through XII grossly intact and there are no  gross focal findings.  BACK:  Her back is normal.   Her electrocardiogram today shows a sinus rhythm at a rate of 59.  A prior  septal infarct cannot be excluded.   DIAGNOSES:  1. Paroxysmal atrial fibrillation (new diagnosis).  2. Hypertension.  3. Hypothyroidism.   PLAN:  Cindy [name] has developed paroxysmal atrial fibrillation.  She has  embolic risk factors of hypertension and age greater than 69.  Her CHADS  score is therefore 2.  I therefore have asked her to discontinue her aspirin  and we will begin Coumadin 5 mg alternating with 2.5 mg p.o. daily.  She  will have a prothrombin time checked at the [facility] on  Monday and she will be followed there with a goal INR of 2-3.  She will  continue on her Toprol at 25 mg p.o. b.i.d. for rate control if her atrial  fibrillation recurs.  Note there has been some fatigue and I wonder if this  may be related to her beta blocker.  If so we could consider discontinuing  that medication as well as her Norvasc and adding Cardizem for rate control  in the future.  She did have some chest tightness with the atrial  fibrillation and we will schedule her for a Myoview for risk stratification.  We will obtain records of her recent admission and electrocardiograms for  documentation of her atrial fibrillation.  If she has recurrent frequent  symptoms in the future, then she may require antiarrhythmic.  I will see her  back in approximately 8 weeks.     Madolyn Frieze Jens Som, MD, [facility]  Electronically Signed    BSC/MedQ  DD: 10/19/2006  DT: 10/19/2006  Job #: [id number]   cc:   Arta Silence, MD

## 2011-05-06 ENCOUNTER — Other Ambulatory Visit: Payer: Self-pay | Admitting: Family Medicine

## 2011-06-12 ENCOUNTER — Encounter: Payer: Self-pay | Admitting: Family Medicine

## 2011-06-23 ENCOUNTER — Other Ambulatory Visit: Payer: Self-pay | Admitting: Family Medicine

## 2011-06-23 DIAGNOSIS — M810 Age-related osteoporosis without current pathological fracture: Secondary | ICD-10-CM

## 2011-06-23 DIAGNOSIS — D649 Anemia, unspecified: Secondary | ICD-10-CM

## 2011-06-23 DIAGNOSIS — D509 Iron deficiency anemia, unspecified: Secondary | ICD-10-CM

## 2011-06-23 DIAGNOSIS — E039 Hypothyroidism, unspecified: Secondary | ICD-10-CM

## 2011-06-23 DIAGNOSIS — I428 Other cardiomyopathies: Secondary | ICD-10-CM

## 2011-06-23 DIAGNOSIS — Z0000 Encounter for general adult medical examination without abnormal findings: Secondary | ICD-10-CM

## 2011-06-23 DIAGNOSIS — R32 Unspecified urinary incontinence: Secondary | ICD-10-CM

## 2011-06-23 DIAGNOSIS — R209 Unspecified disturbances of skin sensation: Secondary | ICD-10-CM

## 2011-06-29 ENCOUNTER — Other Ambulatory Visit (INDEPENDENT_AMBULATORY_CARE_PROVIDER_SITE_OTHER): Payer: Medicare Other | Admitting: Family Medicine

## 2011-06-29 DIAGNOSIS — R209 Unspecified disturbances of skin sensation: Secondary | ICD-10-CM

## 2011-06-29 DIAGNOSIS — R32 Unspecified urinary incontinence: Secondary | ICD-10-CM

## 2011-06-29 DIAGNOSIS — D649 Anemia, unspecified: Secondary | ICD-10-CM

## 2011-06-29 DIAGNOSIS — D509 Iron deficiency anemia, unspecified: Secondary | ICD-10-CM

## 2011-06-29 DIAGNOSIS — M810 Age-related osteoporosis without current pathological fracture: Secondary | ICD-10-CM

## 2011-06-29 DIAGNOSIS — Z0000 Encounter for general adult medical examination without abnormal findings: Secondary | ICD-10-CM

## 2011-06-29 DIAGNOSIS — I428 Other cardiomyopathies: Secondary | ICD-10-CM

## 2011-06-29 DIAGNOSIS — E039 Hypothyroidism, unspecified: Secondary | ICD-10-CM

## 2011-06-29 LAB — HEPATIC FUNCTION PANEL
ALT: 16 U/L (ref 0–35)
AST: 18 U/L (ref 0–37)
Albumin: 3.9 g/dL (ref 3.5–5.2)
Alkaline Phosphatase: 71 U/L (ref 39–117)
Total Protein: 6.8 g/dL (ref 6.0–8.3)

## 2011-06-29 LAB — CBC WITH DIFFERENTIAL/PLATELET
Basophils Relative: 0.1 % (ref 0.0–3.0)
Eosinophils Absolute: 0.1 10*3/uL (ref 0.0–0.7)
Hemoglobin: 14.1 g/dL (ref 12.0–15.0)
MCHC: 33.8 g/dL (ref 30.0–36.0)
MCV: 93.4 fl (ref 78.0–100.0)
Monocytes Absolute: 0.6 10*3/uL (ref 0.1–1.0)
Neutro Abs: 3.4 10*3/uL (ref 1.4–7.7)
Neutrophils Relative %: 59.7 % (ref 43.0–77.0)
RBC: 4.46 Mil/uL (ref 3.87–5.11)

## 2011-06-29 LAB — RENAL FUNCTION PANEL
Albumin: 3.9 g/dL (ref 3.5–5.2)
BUN: 12 mg/dL (ref 6–23)
Chloride: 107 mEq/L (ref 96–112)
Creatinine, Ser: 0.8 mg/dL (ref 0.4–1.2)
Phosphorus: 3.4 mg/dL (ref 2.3–4.6)

## 2011-06-29 LAB — LIPID PANEL
Cholesterol: 188 mg/dL (ref 0–200)
Total CHOL/HDL Ratio: 3
Triglycerides: 133 mg/dL (ref 0.0–149.0)

## 2011-06-29 LAB — IRON: Iron: 81 ug/dL (ref 42–145)

## 2011-06-30 LAB — VITAMIN D 25 HYDROXY (VIT D DEFICIENCY, FRACTURES): Vit D, 25-Hydroxy: 41 ng/mL (ref 30–89)

## 2011-07-08 ENCOUNTER — Encounter: Payer: Self-pay | Admitting: Family Medicine

## 2011-07-08 ENCOUNTER — Ambulatory Visit (INDEPENDENT_AMBULATORY_CARE_PROVIDER_SITE_OTHER): Payer: Medicare Other | Admitting: Family Medicine

## 2011-07-08 DIAGNOSIS — F3289 Other specified depressive episodes: Secondary | ICD-10-CM

## 2011-07-08 DIAGNOSIS — F329 Major depressive disorder, single episode, unspecified: Secondary | ICD-10-CM

## 2011-07-08 DIAGNOSIS — E039 Hypothyroidism, unspecified: Secondary | ICD-10-CM

## 2011-07-08 DIAGNOSIS — E042 Nontoxic multinodular goiter: Secondary | ICD-10-CM

## 2011-07-08 DIAGNOSIS — M1990 Unspecified osteoarthritis, unspecified site: Secondary | ICD-10-CM

## 2011-07-08 DIAGNOSIS — I10 Essential (primary) hypertension: Secondary | ICD-10-CM

## 2011-07-08 DIAGNOSIS — G479 Sleep disorder, unspecified: Secondary | ICD-10-CM | POA: Insufficient documentation

## 2011-07-08 DIAGNOSIS — Z0000 Encounter for general adult medical examination without abnormal findings: Secondary | ICD-10-CM

## 2011-07-08 DIAGNOSIS — K5730 Diverticulosis of large intestine without perforation or abscess without bleeding: Secondary | ICD-10-CM

## 2011-07-08 MED ORDER — ZOLPIDEM TARTRATE 10 MG PO TABS: 10.0000 mg | ORAL_TABLET | Freq: Every evening | ORAL | Status: DC | PRN

## 2011-07-08 MED ORDER — SERTRALINE HCL 50 MG PO TABS: 50.0000 mg | ORAL_TABLET | Freq: Every day | ORAL | Status: DC

## 2011-07-08 NOTE — Assessment & Plan Note (Signed)
Controlled. Cont curr meds.

## 2011-07-08 NOTE — Assessment & Plan Note (Signed)
TFTs therapeutic.

## 2011-07-08 NOTE — Assessment & Plan Note (Signed)
Will try Ambien when needed but stressed not to take all the time. Start with 1/2 if can't get to sleep or if unable to get back to sleep in the middle of the night.

## 2011-07-08 NOTE — Progress Notes (Signed)
Subjective:    Patient ID: Cindy [name], female    DOB: [birth year], 77 y.o.   MRN: [id number]  HPI Pt here for Comp Exam. She is aware that she has problems with hearing and eyesight which seemed to acutely worsen after the fall she took earlier in the year. She is seen routinely for eye problems, last seen three months ago with an appt scheduled the 18th of this month.  She  lost her husband earlier this year as well February 11, 2023) and has had a hard time since. His course was prolonged from heart problems to valvular disease to a stroke in rehab and finally palliative care where he died in 2023-02-11. She is sad and finds it hard to concentrate. She gets tearful when discussing.  She is having difficulty with arthritis of the back, feet and legs. She has been doing exercises shown by her husband's Ortho and these have helped immeasurably. She had been having trouble with stability as well but admits to that improving with the exercises as well.  She has also had difficulty with sleep, sometimes difficulty with getting to sleep in the first place and sometimes getting back to sleep after awakening to go to the bathroom. She has used Tyl PM with good results but can't find this to buy at present.    Review of Systems  Constitutional: Negative for fever, chills, diaphoresis, fatigue and unexpected weight change.  HENT: Negative for hearing loss, ear pain, rhinorrhea, trouble swallowing and tinnitus.   Eyes: Negative for pain, discharge and visual disturbance.  Respiratory: Negative for cough (decreased since off ACEI for Losartan), shortness of breath and wheezing.   Cardiovascular: Negative for chest pain, palpitations and leg swelling.       No Fainting or Fatigue.  Gastrointestinal: Negative for nausea, vomiting, abdominal pain, diarrhea, constipation and blood in stool.       No Heartburn  Genitourinary: Negative for dysuria and frequency.  Musculoskeletal: Negative for myalgias, back pain and arthralgias.    Skin: Negative for rash.       No Itching or Dryness.  Neurological: Positive for tremors (occas.). Negative for numbness.       No Tingling. No Balance Problems.  Hematological: Negative for adenopathy. Does not bruise/bleed easily.  Psychiatric/Behavioral: Negative for agitation. Dysphoric mood: currently depressed.       Objective:   Physical Exam  Constitutional: She is oriented to person, place, and time. She appears well-developed and well-nourished. No distress.  HENT:  Head: Normocephalic and atraumatic.  Left Ear: External ear normal.  Nose: Nose normal.  Mouth/Throat: Oropharynx is clear and moist. No oropharyngeal exudate.  Eyes: Conjunctivae and EOM are normal. Pupils are equal, round, and reactive to light. No scleral icterus.  Neck: Normal range of motion. Neck supple. No thyromegaly present.  Cardiovascular: Normal rate, regular rhythm and normal heart sounds.  Exam reveals no friction rub.   No murmur heard. Pulmonary/Chest: Effort normal and breath sounds normal. No respiratory distress. She has no wheezes. She has no rales.  Abdominal: Soft. Bowel sounds are normal. She exhibits no mass. There is no tenderness.  Musculoskeletal: Normal range of motion. She exhibits no edema and no tenderness.  Lymphadenopathy:    She has no cervical adenopathy.  Neurological: She is alert and oriented to person, place, and time. She has normal reflexes.  Skin: Skin is warm and dry. No rash noted. She is not diaphoretic. No erythema.  Psychiatric: Her behavior is normal. Judgment and thought content normal.  Depressed mood and admits it, different for her.          Assessment & Plan:  HMPE  I have personally reviewed the Medicare Annual Wellness questionnaire and have noted 1. The patient's medical and social history 2. Their use of alcohol, tobacco or illicit drugs 3. Their current medications and supplements 4. The patient's functional ability including ADL's,  fall risks, home safety risks and hearing or visual             impairment. 5. Diet and physical activities 6. Evidence for depression or mood disorders, started on medication.

## 2011-07-08 NOTE — Patient Instructions (Signed)
RTC 6 weeks for recheck  Start Sertraline 50mg  1/2 pill for one week then increase to whole pill. After 4-5 days may use Ambien at night 1/2 pill if unable to sleep or unable to get back to sleep. See me sooner than 6 weeks if problems with medication

## 2011-07-08 NOTE — Assessment & Plan Note (Signed)
No onset due to confluence of problems and situations. She is willing to try medication. Will start Sertraline at 25mg  for one week and increase to 50mg  due to significant disease. Recheck in 6 weeks, sooner if she feels it is needed.

## 2011-07-08 NOTE — Assessment & Plan Note (Signed)
Cont exercises shown by Ortho. When she does them it helps.

## 2011-07-08 NOTE — Assessment & Plan Note (Signed)
Reminded to come in for prolonged LLQ discomfort.

## 2011-07-08 NOTE — Assessment & Plan Note (Signed)
Euthyroid on current dose. Continue. 

## 2011-07-22 ENCOUNTER — Ambulatory Visit (HOSPITAL_BASED_OUTPATIENT_CLINIC_OR_DEPARTMENT_OTHER)
Admission: RE | Admit: 2011-07-22 | Discharge: 2011-07-22 | Disposition: A | Discharge status: Home / Self Care | Payer: Medicare Other | Source: Ambulatory Visit | Attending: Urology | Admitting: Urology

## 2011-07-22 DIAGNOSIS — Z79899 Other long term (current) drug therapy: Secondary | ICD-10-CM | POA: Insufficient documentation

## 2011-07-22 DIAGNOSIS — N393 Stress incontinence (female) (male): Secondary | ICD-10-CM | POA: Insufficient documentation

## 2011-07-22 DIAGNOSIS — Z01812 Encounter for preprocedural laboratory examination: Secondary | ICD-10-CM | POA: Insufficient documentation

## 2011-07-22 DIAGNOSIS — I10 Essential (primary) hypertension: Secondary | ICD-10-CM | POA: Insufficient documentation

## 2011-07-22 LAB — POCT I-STAT 4, (NA,K, GLUC, HGB,HCT)
Glucose, Bld: 104 mg/dL — ABNORMAL HIGH (ref 70–99)
HCT: 41 % (ref 36.0–46.0)
Potassium: 3.9 mEq/L (ref 3.5–5.1)
Sodium: 139 mEq/L (ref 135–145)

## 2011-08-05 ENCOUNTER — Other Ambulatory Visit: Payer: Self-pay | Admitting: Family Medicine

## 2011-08-16 ENCOUNTER — Inpatient Hospital Stay (HOSPITAL_COMMUNITY)
Admission: EM | Admit: 2011-08-16 | Discharge: 2011-08-18 | DRG: 310 | Disposition: A | Discharge status: Home Health | Payer: Medicare Other | Attending: Cardiology | Admitting: Cardiology

## 2011-08-16 ENCOUNTER — Telehealth: Payer: Self-pay | Admitting: Cardiology

## 2011-08-16 DIAGNOSIS — K5730 Diverticulosis of large intestine without perforation or abscess without bleeding: Secondary | ICD-10-CM | POA: Diagnosis present

## 2011-08-16 DIAGNOSIS — R32 Unspecified urinary incontinence: Secondary | ICD-10-CM | POA: Diagnosis present

## 2011-08-16 DIAGNOSIS — I4891 Unspecified atrial fibrillation: Principal | ICD-10-CM | POA: Diagnosis present

## 2011-08-16 DIAGNOSIS — M1990 Unspecified osteoarthritis, unspecified site: Secondary | ICD-10-CM | POA: Diagnosis present

## 2011-08-16 DIAGNOSIS — M810 Age-related osteoporosis without current pathological fracture: Secondary | ICD-10-CM | POA: Diagnosis present

## 2011-08-16 DIAGNOSIS — I421 Obstructive hypertrophic cardiomyopathy: Secondary | ICD-10-CM | POA: Diagnosis present

## 2011-08-16 DIAGNOSIS — Z9181 History of falling: Secondary | ICD-10-CM

## 2011-08-16 DIAGNOSIS — Z7982 Long term (current) use of aspirin: Secondary | ICD-10-CM

## 2011-08-16 DIAGNOSIS — E876 Hypokalemia: Secondary | ICD-10-CM | POA: Diagnosis present

## 2011-08-16 DIAGNOSIS — I10 Essential (primary) hypertension: Secondary | ICD-10-CM

## 2011-08-16 DIAGNOSIS — I059 Rheumatic mitral valve disease, unspecified: Secondary | ICD-10-CM | POA: Diagnosis present

## 2011-08-16 DIAGNOSIS — Z9849 Cataract extraction status, unspecified eye: Secondary | ICD-10-CM

## 2011-08-16 DIAGNOSIS — D509 Iron deficiency anemia, unspecified: Secondary | ICD-10-CM | POA: Diagnosis present

## 2011-08-16 DIAGNOSIS — E039 Hypothyroidism, unspecified: Secondary | ICD-10-CM | POA: Diagnosis present

## 2011-08-16 DIAGNOSIS — K2270 Barrett's esophagus without dysplasia: Secondary | ICD-10-CM | POA: Diagnosis present

## 2011-08-16 DIAGNOSIS — Z79899 Other long term (current) drug therapy: Secondary | ICD-10-CM

## 2011-08-16 DIAGNOSIS — E785 Hyperlipidemia, unspecified: Secondary | ICD-10-CM | POA: Diagnosis present

## 2011-08-16 DIAGNOSIS — E042 Nontoxic multinodular goiter: Secondary | ICD-10-CM | POA: Diagnosis present

## 2011-08-16 LAB — DIFFERENTIAL
Basophils Absolute: 0 10*3/uL (ref 0.0–0.1)
Basophils Relative: 0 % (ref 0–1)
Eosinophils Absolute: 0.1 10*3/uL (ref 0.0–0.7)
Eosinophils Relative: 1 % (ref 0–5)
Monocytes Absolute: 0.7 10*3/uL (ref 0.1–1.0)

## 2011-08-16 LAB — CBC
Hemoglobin: 13.6 g/dL (ref 12.0–15.0)
MCH: 30.6 pg (ref 26.0–34.0)
MCV: 89.4 fL (ref 78.0–100.0)
Platelets: 208 10*3/uL (ref 150–400)
RBC: 4.45 MIL/uL (ref 3.87–5.11)

## 2011-08-16 LAB — POCT I-STAT TROPONIN I: Troponin i, poc: 0.04 ng/mL (ref 0.00–0.08)

## 2011-08-16 LAB — POCT I-STAT, CHEM 8
BUN: 14 mg/dL (ref 6–23)
Calcium, Ion: 1.09 mmol/L — ABNORMAL LOW (ref 1.12–1.32)
Chloride: 104 meq/L (ref 96–112)
Creatinine, Ser: 0.8 mg/dL (ref 0.50–1.10)
Glucose, Bld: 95 mg/dL (ref 70–99)
HCT: 40 % (ref 36.0–46.0)
Hemoglobin: 13.6 g/dL (ref 12.0–15.0)
Potassium: 3.9 mEq/L (ref 3.5–5.1)
Sodium: 138 meq/L (ref 135–145)
TCO2: 24 mmol/L (ref 0–100)

## 2011-08-16 LAB — CK TOTAL AND CKMB (NOT AT ARMC)
CK, MB: 4.5 ng/mL — ABNORMAL HIGH (ref 0.3–4.0)
Relative Index: INVALID (ref 0.0–2.5)

## 2011-08-16 LAB — MRSA PCR SCREENING: MRSA by PCR: NEGATIVE

## 2011-08-16 LAB — PROTIME-INR: INR: 0.93 (ref 0.00–1.49)

## 2011-08-16 NOTE — Telephone Encounter (Signed)
Left message for pt to call me back to discuss how she is feeling and her heart rate Cindy [name]

## 2011-08-16 NOTE — Telephone Encounter (Signed)
Per pt call, pt went into a-fib and pt wants to know if it is necessary for her to come in to see the the MD. Please return call to discuss further.

## 2011-08-17 DIAGNOSIS — I359 Nonrheumatic aortic valve disorder, unspecified: Secondary | ICD-10-CM

## 2011-08-17 LAB — PROTIME-INR: Prothrombin Time: 13.1 seconds (ref 11.6–15.2)

## 2011-08-17 LAB — CBC
MCHC: 33.2 g/dL (ref 30.0–36.0)
Platelets: 219 10*3/uL (ref 150–400)
RDW: 13.2 % (ref 11.5–15.5)

## 2011-08-17 LAB — BASIC METABOLIC PANEL
BUN: 13 mg/dL (ref 6–23)
Creatinine, Ser: 0.7 mg/dL (ref 0.50–1.10)
GFR calc Af Amer: >60 mL/min (ref >60)
GFR calc non Af Amer: >60 mL/min (ref >60)

## 2011-08-17 LAB — HEPARIN LEVEL (UNFRACTIONATED): Heparin Unfractionated: 0.31 IU/mL (ref 0.30–0.70)

## 2011-08-18 ENCOUNTER — Telehealth: Payer: Self-pay | Admitting: Cardiology

## 2011-08-18 LAB — CBC
MCHC: 33 g/dL (ref 30.0–36.0)
RDW: 13 % (ref 11.5–15.5)

## 2011-08-18 LAB — PROTIME-INR
INR: 1 (ref 0.00–1.49)
Prothrombin Time: 13.4 seconds (ref 11.6–15.2)

## 2011-08-18 NOTE — Telephone Encounter (Signed)
I attempted to call back. Store was closed. I do not think this is Dr Alford Highland pt.

## 2011-08-18 NOTE — Telephone Encounter (Signed)
Has a question about a pres that was written today, Amiodarone and the dosing of this.  Pt says one thing and written for something else.  Please call phar back with correct infor

## 2011-08-19 NOTE — Telephone Encounter (Signed)
PT WENT TO ER Cindy [name]

## 2011-08-19 NOTE — Telephone Encounter (Signed)
Patient calling regarding sided effect from medication .

## 2011-08-19 NOTE — Telephone Encounter (Signed)
Spoke with the pharm, new directions given Cindy [name]

## 2011-08-19 NOTE — Telephone Encounter (Signed)
Spoke with pt, she was recently in the hosp and her amiodarone was increased to 400 mg bid. She states she has a bad headache and has not been able to sleep and that is what happened last time the amiodarone was increased. She will decrease the dosage to one 200 mg tablet twice daily to see if that helps with her symptoms. She has an appt with dr Jens Som on 09-02-11. She will call prior to the appt with problems. Cindy [name]

## 2011-08-20 NOTE — H&P (Signed)
Cindy [name], Cindy [name] NO.:  [id number]  MEDICAL RECORD NO.:  [id number]  LOCATION:  2917                         FACILITY:  [facility]  PHYSICIAN:  Madolyn Frieze. Jens Som, MD, FACCDATE OF BIRTH:  Feb 18, 1922  DATE OF ADMISSION:  08/16/2011 DATE OF DISCHARGE:                             HISTORY & PHYSICAL   The patient is an 77 year old female with past medical history of hypertrophic obstructive cardiomyopathy, hypertension, paroxysmal atrial fibrillation, hyperlipidemia, hypothyroidism who I am asked to evaluate for atrial fibrillation, hypertension.  The patient does have a long history of hypertrophic obstructive cardiomyopathy.  She had a Myoview in November 2007 that showed an ejection fraction of 70% with normal perfusion.  An echocardiogram in June 2010 showed hyperdynamic LV function, and a peak gradient to the LV OT of 108 mmHg.  Her ejection fraction was 75% and there was mild left ventricular hypertrophy.  There was dynamic obstruction.  The patient states that for the past 3 days, she has had palpitations and dyspnea.  Prior to that, she was not having these symptoms.  There has been no chest pain.  She came to the emergency room and was found to be in atrial fibrillation with rapid ventricular response and Cardiology was asked to further evaluate.  MEDICATIONS: 1. Aspirin 325 mg p.o. daily. 2. Cardizem 180 mg p.o. daily. 3. Allegra 180 mg p.o. daily as needed. 4. Fish oil. 5. Hydrochlorothiazide 25 mg p.o. daily. 6. Hydrocodone as needed. 7. Levothyroxine 25 mcg p.o. daily. 8. Lisinopril 20 mg p.o. daily. 9. Multivitamin one p.o. daily. 10.Zantac 150 mg as needed. 11.Septra one p.o. daily.  PAST MEDICAL HISTORY:  Significant for hypertension, hyperlipidemia, but there is no diabetes mellitus.  She does have a history of paroxysmal atrial fibrillation and hypertrophic obstructive cardiomyopathy.  She has a history of iron-deficiency anemia and  hypothyroidism.  She also has a history of multinodular goiter.  She has DJD, urinary incontinence, osteoporosis, and diverticulosis.  She has a history of patellar fracture.  PAST SURGICAL HISTORY:  She has had previous tonsillectomy, appendectomy, breast reduction surgery, bladder surgery, cataract surgery, and rotator cuff surgery.  SOCIAL HISTORY:  She does not smoke nor she consumes alcohol.  FAMILY HISTORY:  Positive for congestive heart failure.  REVIEW OF SYSTEMS:  She denies any headaches or fevers or chills.  There is no productive cough or hemoptysis.  There is no dysphagia, odynophagia, melena, or hematochezia.  There is no dysuria, hematuria. No rash or seizure activity.  There is no orthopnea, PND, or pedal edema.  Remaining systems are negative.  PHYSICAL EXAMINATION:  VITAL SIGNS:  Blood pressure 170/80, her pulse is 117. GENERAL:  She is well-developed, well-nourished in no acute distress. SKIN:  Warm and dry.  The patient does not appear to be depressed. There is no peripheral clubbing. BACK:  Normal. HEENT:  Normal eyelids. NECK:  Supple with normal upstroke bilaterally.  No bruits heard.  There is no jugular venous distention.  I cannot appreciate thyromegaly. CHEST:  Clear to auscultation.  No expansion. CARDIOVASCULAR:  Irregular rhythm.  There is a 2/6 systolic murmur at the left sternal border.  It increases  with Valsalva. ABDOMEN:  Nontender, nondistended.  Positive bowel sounds.  No hepatosplenomegaly.  No masses appreciated.  There is no abdominal bruit.  She has 2+ femoral pulses bilaterally.  No bruits. EXTREMITIES:  No edema.  No cords.  She has 2+ dorsalis pedis pulse bilaterally. NEUROLOGIC:  Grossly intact.  Her electrocardiogram shows atrial fibrillation with rapid ventricular response at 117.  Prior septal infarct cannot be excluded.  There are diffuse ST depression.  Her initial troponin is negative.  Hemoglobin is 13.6 with a hematocrit of  39.8.  Platelet count is 208.  Her sodium is 138 and potassium 3.9.  BUN and creatinine of 14 and 0.8.  DIAGNOSES: 1. Paroxysmal atrial fibrillation - the patient has developed     recurrent atrial fibrillation.  We will admit to telemetry and     continue with her Cardizem.  I will add amiodarone at 400 mg p.o.     b.i.d. to hopefully maintain sinus rhythm.  We will need to watch     her closely for any evidence of bradycardia and she may need a     lower dose of Cardizem once she is in sinus rhythm.  I will add IV     heparin for now and continue her aspirin.  She is not a Coumadin     candidate as she has a long history of intermittent falls.  We will     plan to repeat her echocardiogram.  Her recent TSH was normal. 2. Hypertrophic obstructive cardiomyopathy - we will repeat     echocardiogram. 3. Hypertension - we will monitor blood pressure and increase     medications as needed.     Madolyn Frieze Jens Som, MD, [facility]     BSC/MEDQ  D:  08/16/2011  T:  08/17/2011  Job:  [id number]  Electronically Signed by Olga Millers MD [facility] on 08/20/2011 12:54:16 PM

## 2011-08-23 ENCOUNTER — Emergency Department (HOSPITAL_COMMUNITY): Payer: Medicare Other

## 2011-08-23 ENCOUNTER — Emergency Department (HOSPITAL_COMMUNITY)
Admission: EM | Admit: 2011-08-23 | Discharge: 2011-08-24 | Disposition: A | Discharge status: Home / Self Care | Payer: Medicare Other | Attending: Emergency Medicine | Admitting: Emergency Medicine

## 2011-08-23 DIAGNOSIS — R51 Headache: Secondary | ICD-10-CM | POA: Insufficient documentation

## 2011-08-23 DIAGNOSIS — I4891 Unspecified atrial fibrillation: Secondary | ICD-10-CM | POA: Insufficient documentation

## 2011-08-23 DIAGNOSIS — R0602 Shortness of breath: Secondary | ICD-10-CM | POA: Insufficient documentation

## 2011-08-23 DIAGNOSIS — I440 Atrioventricular block, first degree: Secondary | ICD-10-CM | POA: Insufficient documentation

## 2011-08-23 DIAGNOSIS — N390 Urinary tract infection, site not specified: Secondary | ICD-10-CM | POA: Insufficient documentation

## 2011-08-23 DIAGNOSIS — Z79899 Other long term (current) drug therapy: Secondary | ICD-10-CM | POA: Insufficient documentation

## 2011-08-23 DIAGNOSIS — I10 Essential (primary) hypertension: Secondary | ICD-10-CM | POA: Insufficient documentation

## 2011-08-23 LAB — CBC
HCT: 36.8 % (ref 36.0–46.0)
Hemoglobin: 11.9 g/dL — ABNORMAL LOW (ref 12.0–15.0)
MCH: 29.3 pg (ref 26.0–34.0)
MCHC: 32.3 g/dL (ref 30.0–36.0)
MCV: 90.6 fL (ref 78.0–100.0)
Platelets: 235 10*3/uL (ref 150–400)
RBC: 4.06 MIL/uL (ref 3.87–5.11)
RDW: 12.9 % (ref 11.5–15.5)
WBC: 6.3 10*3/uL (ref 4.0–10.5)

## 2011-08-23 LAB — DIFFERENTIAL
Basophils Relative: 0 % (ref 0–1)
Eosinophils Absolute: 0.1 10*3/uL (ref 0.0–0.7)
Lymphs Abs: 1.5 10*3/uL (ref 0.7–4.0)
Monocytes Absolute: 0.8 10*3/uL (ref 0.1–1.0)
Monocytes Relative: 12 % (ref 3–12)
Neutro Abs: 4 10*3/uL (ref 1.7–7.7)

## 2011-08-23 LAB — URINALYSIS, ROUTINE W REFLEX MICROSCOPIC
Glucose, UA: NEGATIVE mg/dL
Specific Gravity, Urine: 1.012 (ref 1.005–1.030)
pH: 6.5 (ref 5.0–8.0)

## 2011-08-23 LAB — BASIC METABOLIC PANEL
BUN: 15 mg/dL (ref 6–23)
CO2: 27 mEq/L (ref 19–32)
Chloride: 98 mEq/L (ref 96–112)
GFR calc non Af Amer: >60 mL/min (ref >60)
Glucose, Bld: 96 mg/dL (ref 70–99)
Potassium: 3.7 mEq/L (ref 3.5–5.1)

## 2011-08-23 LAB — URINE MICROSCOPIC-ADD ON

## 2011-08-23 LAB — PROTIME-INR: Prothrombin Time: 13.2 seconds (ref 11.6–15.2)

## 2011-08-24 ENCOUNTER — Telehealth: Payer: Self-pay | Admitting: Cardiology

## 2011-08-24 NOTE — Telephone Encounter (Signed)
Patient states since starting Amiodarone medication she has been having terrible headaches. Amiodarone dose was decrease from 400 mg to 200 mg twice a day on 08/19/11. Patient states she had a great headache and was feeling so bad yesterday, the paramedics came to her home. The EMS  Recommended for pt  to go to the ER.  Her B/P was over 200 systolic at that time. Patient states in the ER an scan of her head was done and could not find anything. Patient states today, she  has taken her B/P medications, but  not taken the Amiodarone , and she  already is feeling better. Patient does not know what her B/P is today, because she said the machine is not accurate.  Patient will hold Amiodarone medication today and will wait to see what Dr. Jens Som recommends in the AM.

## 2011-08-24 NOTE — Telephone Encounter (Signed)
Pt was put on some medication that she has a reaction to and needs to talk to someone about it she was in ED all last night

## 2011-08-25 LAB — URINE CULTURE

## 2011-08-25 NOTE — Telephone Encounter (Signed)
Will forward for dr Jens Som review Cindy [name]

## 2011-08-25 NOTE — Telephone Encounter (Signed)
Dc amiodarone and follow BP Olga Millers

## 2011-08-26 NOTE — Telephone Encounter (Signed)
PT AWARE TO STOP AMIODARONE AND MONITOR B/P .Cindy [name]

## 2011-08-30 LAB — POCT CARDIAC MARKERS: Troponin i, poc: <0.05

## 2011-09-02 ENCOUNTER — Encounter: Payer: Self-pay | Admitting: Cardiology

## 2011-09-02 ENCOUNTER — Ambulatory Visit (INDEPENDENT_AMBULATORY_CARE_PROVIDER_SITE_OTHER): Payer: Medicare Other | Admitting: Cardiology

## 2011-09-02 DIAGNOSIS — I4891 Unspecified atrial fibrillation: Secondary | ICD-10-CM

## 2011-09-02 DIAGNOSIS — I421 Obstructive hypertrophic cardiomyopathy: Secondary | ICD-10-CM

## 2011-09-02 DIAGNOSIS — I10 Essential (primary) hypertension: Secondary | ICD-10-CM

## 2011-09-02 NOTE — Assessment & Plan Note (Signed)
Patient in sinus rhythm on examination. She discontinued her amiodarone previously as she felt it may be causing headaches. However her blood pressure was elevated. Her blood pressure is now controlled and she is checking this at home. Resume a amiodarone at 200 mg daily to see if she will tolerate it. This should help maintain sinus rhythm. Continue aspirin. Not a Coumadin candidate given her history of falls. If she tolerates amiodarone Will plan TSH, liver function and chest x-ray in 3 months.

## 2011-09-02 NOTE — Assessment & Plan Note (Signed)
Continue present medications; her symptoms are reasonably well controlled.

## 2011-09-02 NOTE — Patient Instructions (Signed)
Your physician recommends that you schedule a follow-up appointment in: 4 WEEKS  RESTART AMIODARONE 200 MG ONE TABLET ONCE DAILY

## 2011-09-02 NOTE — Assessment & Plan Note (Signed)
Blood pressure controlled. Continue present medications. I will see her back in 4 weeks to make sure that she does not become bradycardic with the addition of amiodarone. We may need to reduce Cardizem in the future after amiodarone loading.

## 2011-09-02 NOTE — Progress Notes (Signed)
HPI:Cindy [name] is a pleasant female who has a history of paroxysmal atrial fibrillation and hypertrophic obstructive cardiomyopathy physiology. Myoview performed in November of 2007 showed an ejection fraction of 70%, normal perfusion and mild soft tissue attenuation. Her last echocardiogram was performed in Sept 2012. This revealed moderate LVH. There was severe focal basal hypertrophy of the septum. Systolic function was normal. The estimated ejection fraction was in the range of 60% to 65%. There was dynamic obstruction. There was dynamic obstruction in the outflow tract, with a peak velocity of 4.5cm/sec and a peak gradient of 82mm Hg. Wall motion was normal; there were no regional wall motion abnormalities. Doppler parameters are consistent with high ventricular filling pressure.There was mild aortic stenosis. Trivial regurgitation. Mild MR. The left atrium was moderately dilated. Pulmonary Systolic pressure was mildly increased. Patient recently admitted with afib and started on amiodarone. Converted to sinus. However she did not tolerate amiodarone stating it caused headaches. Since then, she feels better. She has some dyspnea on exertion but no orthopnea, PND, pedal edema, syncope, or exertional chest pain   Current Outpatient Prescriptions  Medication Sig Dispense Refill  . aspirin 325 MG tablet Take 325 mg by mouth daily.        Marland Kitchen diltiazem (CARDIZEM CD) 180 MG 24 hr capsule Take 180 mg by mouth 2 (two) times daily.       . fexofenadine (ALLEGRA) 180 MG tablet Take 180 mg by mouth daily as needed.        . fish oil-omega-3 fatty acids 1000 MG capsule Take 1 g by mouth 2 (two) times daily.        . hydrochlorothiazide 25 MG tablet Take 25 mg by mouth daily.        Marland Kitchen HYDROcodone-acetaminophen (VICODIN) 5-500 MG per tablet Take 1 tablet by mouth. Take 1/2 tablet every 8 hours as needed       . levothyroxine (SYNTHROID, LEVOTHROID) 25 MCG tablet TAKE 1 TABLET DAILY (PLEASE KEEP SCHEDULED APPOINTMENT)   90 tablet  2  . losartan (COZAAR) 50 MG tablet Take 100 mg by mouth daily.       . Multiple Vitamins-Minerals (OCUVITE PRESERVISION) TABS Take 1 tablet by mouth daily.        . ranitidine (ZANTAC) 150 MG capsule Take 150 mg by mouth as needed.           Past Medical History  Diagnosis Date  . Atrial fibrillation 04/23-24/2007    [facility] AFib, CT chest negative, CVTS/ P.E.  . Hypertrophic cardiomyopathy   . Anemia, iron deficiency   . Degenerative joint disease     back  . Urinary incontinence   . Diverticulosis of colon (without mention of hemorrhage) 2003/ 08/2000    EGD/colonoscopy Barretts esophagus//H.H divertics 08/2000  . Anemia   . Patellar fracture     right  . Cervical mass     C2 lateral mass fracture  . Hypertension   . Hypercholesterolemia     219/497  . Hypothyroidism   . Multinodular goiter (nontoxic)   . Osteoporosis   . DJD (degenerative joint disease)     back    Past Surgical History  Procedure Date  . Tonsillectomy   . Breast reduction surgery   . Bladder surgery     bladder tack early 90's  . Dilation and curettage, diagnostic / therapeutic 09/07/00    endometrial polyps removed, path all benign   . Cataract extraction 04/03  . Tear duct probing 07/29/03  tear duck surg  . Cystourethroscopy 09/17/03  . Rotator cuff repair 09/07/05    right rotator cuff surg( Dr. Darrelyn Hillock)  . Appendectomy 1941  . Breast surgery 1981    breast reduction  . Eye surgery 03/2002    cataract OS  . Thyroid ultrasound 10/14/2003    MNG, no dominant masses  . Doppler echocardiography 03/05/2002&09/11/2003    ECHO, EF wnl, mild stenosis, A.S. mild MR, Mild T.R03/31/2003//ECHO EF 70%,LVH, ?diast dysfunction 09/11/2003    History   Social History  . Marital Status: Married    Spouse Name: N/A    Number of Children: N/A  . Years of Education: N/A   Occupational History  . Not on file.   Social History Main Topics  . Smoking status: Former Smoker -- 0.5  packs/day for 4 years    Quit date: 07/06/1974  . Smokeless tobacco: Never Used   Comment: quit in 1975  . Alcohol Use: No  . Drug Use: No  . Sexually Active: No   Other Topics Concern  . 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 Remaining systems are negative.  Physical Exam: Well-developed well-nourished in no acute distress.  Skin is warm and dry.  HEENT is normal.  Neck is supple. No thyromegaly.  Chest is clear to auscultation with normal expansion.  Cardiovascular exam is regular rate and rhythm. 2/6 soft murmur left sternal border. Increase with Valsalva. Abdominal exam nontender or distended. No masses palpated. Extremities show no edema. neuro grossly intact

## 2011-09-05 NOTE — Discharge Summary (Signed)
NAMEDESTANI, WAMSER NO.:  [id number]  MEDICAL RECORD NO.:  [id number]  LOCATION:  2008                         FACILITY:  [facility]  PHYSICIAN:  Marca Ancona, MD      DATE OF BIRTH:  01-07-1922  DATE OF ADMISSION:  08/16/2011 DATE OF DISCHARGE:  08/18/2011                              DISCHARGE SUMMARY   PROCEDURES:  A 2-D echocardiogram without contrast.  PRIMARY FINAL DISCHARGE DIAGNOSIS:  Paroxysmal atrial fibrillation with rapid ventricular response.  SECONDARY DIAGNOSES: 1. Hypertension. 2. Hyperlipidemia. 3. Hypertrophic obstructive cardiomyopathy with an echocardiogram this     admission showing an ejection fraction of 60-65%, moderate left     ventricular hypertrophy, dynamic obstruction in the outflow tract     with a peak velocity of 4.5 cm/sec and peak gradient of 82 mmHg,     high ventricular filling pressures and mild mitral regurgitation,     PAS 42. 4. Iron deficiency anemia with a hemoglobin of 11.6 and hematocrit of     35.1 this admission. 5. Hypokalemia, supplemented. 6. Anticoagulation with aspirin only secondary to a history of falls. 7. History of a fall in 2010 with a C2 fracture and a right patellar     fracture, requiring open reduction and internal fixation. 8. Allergy or intolerance to AVAPRO, MICARDIS, ALTACE, and LEVOXYL. 9. History of multinodular goiter. 10.Degenerative joint disease. 11.History of urinary incontinence. 12.Osteoporosis. 13.Diverticulosis. 14.Status post tonsillectomy, appendectomy, breast reduction, bladder     tack, EGD, and colonoscopy showing Barrett esophagus and small-     bowel followthrough as well as cataract surgery and tear duct     surgery, right rotator cuff surgery as well.  TIME AT DISCHARGE:  34 minutes.  HOSPITAL COURSE:  Ms. Adney is an 77 year old female with a history of AFib.  She presented to the hospital with palpitations and was in AFib with RVR.  She was admitted for further  evaluation and treatment.  Her potassium was slightly low and was supplemented.  She was started on amiodarone and spontaneously converted to sinus rhythm.  The amiodarone load was continued.  A TSH was within normal limits in July and was not rechecked.  LFTs can be checked as an outpatient.  She had a minimal elevation in her CK-MB at 4.5, but her troponins were negative and she had no chest pain.  A 2-D echocardiogram showed a normal EF and no wall motion abnormalities and no further workup is indicated at this time. On August 18, 2011, she was seen by Dr. Shirlee Latch.  She was maintaining sinus rhythm and ambulating without chest pain or shortness of breath. She is considered stable for discharge, to follow up as an outpatient.  DISCHARGE INSTRUCTIONS: 1. Her activity level is to be increased gradually. 2. She is encouraged to stick to a low-sodium heart-healthy diet. 3. She is to follow up with Dr. Jens Som on September 02, 2011 at     12:30 and with Dr. Hetty Ely as needed.  DISCHARGE MEDICATIONS: 1. Losartan 50 mg daily. 2. Fish oil two capsules daily. 3. Cardizem CD 180 mg is discontinued. 4. Cardizem CD 120 mg daily. 5. Ranitidine daily p.r.n.  6. Multivitamins daily. 7. Vicodin 5 mg one half tablet q.6 h. p.r.n. 8. Aspirin 325 mg a day. 9. Fexofenadine 180 mg daily p.r.n. 10.Hydrochlorothiazide 25 mg a day. 11.Levothyroxine 25 mcg daily. 12.Trimethoprim sulfamethoxazole daily as prior to admission.     Theodore Demark, PA-C   ______________________________ Marca Ancona, MD    RB/MEDQ  D:  08/18/2011  T:  08/18/2011  Job:  [id number]  cc:   Arta Silence, MD  Electronically Signed by Theodore Demark PA-C on 08/29/2011 02:53:22 PM Electronically Signed by Marca Ancona MD on 09/05/2011 11:21:33 PM

## 2011-09-08 ENCOUNTER — Encounter: Payer: Self-pay | Admitting: Family Medicine

## 2011-09-08 ENCOUNTER — Ambulatory Visit (INDEPENDENT_AMBULATORY_CARE_PROVIDER_SITE_OTHER): Payer: Medicare Other | Admitting: Family Medicine

## 2011-09-08 DIAGNOSIS — I4891 Unspecified atrial fibrillation: Secondary | ICD-10-CM

## 2011-09-08 DIAGNOSIS — I10 Essential (primary) hypertension: Secondary | ICD-10-CM

## 2011-09-08 NOTE — Assessment & Plan Note (Signed)
Nos good today. Cont curr meds. BP Readings from Last 3 Encounters:  09/08/11 120/70  09/02/11 144/75  07/08/11 136/80

## 2011-09-08 NOTE — Progress Notes (Signed)
  Subjective:    Patient ID: Cindy [name], female    DOB: 02/11/1922, 77 y.o.   MRN: [id number]  HPI Pt here for followup after starting Sertraline last visit for depression and anxiety. I started her on 25mg  to increase to 50 after a few weeks. She has been in the hospital for Afib. She has been really debilitated from BP and Afib meds since being out of the hospital. Dr Jens Som wanted her on increased dose of Cardizem which had to be adjusted. She was also put on Amiodarone which has significantly bothered her in many ways. Last weekend her BP went way up and she called the EMTs. Her BP was above 200, diastolic was 100. They transported her to the hospital and was seen for headache and has no idea what her BP was by the time she was seen. They gave her ASA and headache improved. She had head CT and that was benign. She was treated for UTI after urine specimen done from bedpan sample. Wynelle Link nite was the last time she took her Amiodarone. Her BP has been fine since and she thinks this is the cause of that elevation. She is very sensitive to medication and often requires half doses. She took one Sertraline after last visit and had no energy and felt washed out during the entire next day. She took no more. She now thinks she is handling the depression well and her children tell her she is strong and doesn't need that kind of medication. She prefers not to try anything else. She has an appt with Dr Jens Som later this month on the 29th.     Review of SystemsNoncontributory except as above.       Objective:   Physical Exam  Constitutional: She appears well-developed and well-nourished. No distress.  HENT:  Head: Normocephalic and atraumatic.  Right Ear: External ear normal.  Left Ear: External ear normal.  Nose: Nose normal.  Mouth/Throat: Oropharynx is clear and moist. No oropharyngeal exudate.  Eyes: Conjunctivae and EOM are normal. Pupils are equal, round, and reactive to light.  Neck: Normal  range of motion. Neck supple. No thyromegaly present.  Cardiovascular: Normal rate, regular rhythm and normal heart sounds.   Pulmonary/Chest: Effort normal and breath sounds normal. She has no wheezes. She has no rales.  Lymphadenopathy:    She has no cervical adenopathy.  Skin: She is not diaphoretic.  Psychiatric: She has a normal mood and affect. Her behavior is normal. Judgment and thought content normal.       Smiling today and much more positive.          Assessment & Plan:

## 2011-09-08 NOTE — Assessment & Plan Note (Signed)
Rate well controlled, diff to ascertain via auscultation if in Afib or not due to rate.  F/u with Dr Jens Som as scheduled.

## 2011-09-08 NOTE — Patient Instructions (Signed)
May stop Sertraline. Cont curr medications and keep Dr Ludwig Clarks appt.

## 2011-09-24 ENCOUNTER — Other Ambulatory Visit: Payer: Self-pay | Admitting: *Deleted

## 2011-09-24 NOTE — Telephone Encounter (Signed)
Not sure if you or Dr. Kathie Rhodes is comfortable sending in 90 day supplies of these meds.

## 2011-09-26 MED ORDER — ZOLPIDEM TARTRATE 10 MG PO TABS: 10.0000 mg | ORAL_TABLET | Freq: Every evening | ORAL | Status: DC | PRN

## 2011-09-26 MED ORDER — HYDROCODONE-ACETAMINOPHEN 5-500 MG PO TABS: ORAL_TABLET | ORAL | Status: DC

## 2011-09-26 NOTE — Telephone Encounter (Signed)
Please call in 30 day supply and have the long term come through Dr. Kathie Rhodes.  Please notify him as a FYI.  Thanks.  I marked them both as no print.  Please see if she wants them mail order or local.

## 2011-09-27 NOTE — Telephone Encounter (Signed)
Spoke to patient and was advised that she does not want prescriptions called to a local pharmacy because she has plenty and will wait until Dr. Hetty Ely returns Wednesday. Patient states that she uses [facility] and does not use [facility]. Patient will pickl up the written prescriptions and mail them in when they are ready.  Request is in your in box.

## 2011-09-28 NOTE — Telephone Encounter (Signed)
She may have 90 Hydrocodone at a time, as that is what she had been getting in the past. I will not fill Ambien for a three month supply as it is a sleeping medication and I do not fill that for 90 days. Ambien was meant to be a stopgap measure not a routine medication. She was to be taking 1/2 pill ONLY WHEN COULDN'T GET TO SLEEP, NOT EVERY NITE. If she wants Ambien monthly, 30 pills should last longer than 30 days.

## 2011-09-29 MED ORDER — HYDROCODONE-ACETAMINOPHEN 5-500 MG PO TABS: ORAL_TABLET | ORAL | Status: DC

## 2011-09-29 NOTE — Telephone Encounter (Signed)
Addended by: Arta Silence on: 09/29/2011 12:32 PM   Modules accepted: Orders

## 2011-09-29 NOTE — Telephone Encounter (Signed)
Patient notified as instructed by telephone. Patient advised that rx for Hydrocodone is up front and ready for pickup and rx for Ambien will be called to pharmacy for her per Dr. Hetty Ely. Rx called to [facility] as instructed.

## 2011-10-04 ENCOUNTER — Encounter: Payer: Self-pay | Admitting: Cardiology

## 2011-10-04 ENCOUNTER — Ambulatory Visit (INDEPENDENT_AMBULATORY_CARE_PROVIDER_SITE_OTHER): Payer: Medicare Other | Admitting: Cardiology

## 2011-10-04 VITALS — BP 180/70 | HR 73 | Ht 60.0 in | Wt 141.0 lb

## 2011-10-04 DIAGNOSIS — I10 Essential (primary) hypertension: Secondary | ICD-10-CM

## 2011-10-04 DIAGNOSIS — I4891 Unspecified atrial fibrillation: Secondary | ICD-10-CM

## 2011-10-04 DIAGNOSIS — I428 Other cardiomyopathies: Secondary | ICD-10-CM

## 2011-10-04 NOTE — Progress Notes (Signed)
HPI:Cindy [name] is a pleasant female who has a history of paroxysmal atrial fibrillation and hypertrophic obstructive cardiomyopathy physiology. Myoview performed in November of 2007 showed an ejection fraction of 70%, normal perfusion and mild soft tissue attenuation. Her last echocardiogram was performed in Sept 2012. This revealed moderate LVH. There was severe focal basal hypertrophy of the septum. Systolic function was normal. The estimated ejection fraction was in the range of 60% to 65%. There was dynamic obstruction in the outflow tract, with a peak velocity of 4.5cm/sec and a peak gradient of 82mm Hg. Wall motion was normal; there were no regional wall motion abnormalities. Doppler parameters are consistent with high ventricular filling pressure.There was mild aortic stenosis. Trivial regurgitation. Mild MR. The left atrium was moderately dilated. Pulmonary Systolic pressure was mildly increased. Patient recently admitted with afib and started on amiodarone. Converted to sinus. However she stopped her amiodarone as she felt it may be causing headaches. When I saw her in September we resumed amiodarone at 200 mg daily. Since then, she discontinued her amiodarone again as she stated it was causing headaches. She has some dyspnea on exertion but no orthopnea, PND, pedal edema, syncope or chest pain.  Current Outpatient Prescriptions  Medication Sig Dispense Refill  . aspirin 325 MG tablet Take 325 mg by mouth daily.        Marland Kitchen diltiazem (CARDIZEM CD) 180 MG 24 hr capsule Take 180 mg by mouth daily.       . fexofenadine (ALLEGRA) 180 MG tablet Take 180 mg by mouth daily as needed.        . fish oil-omega-3 fatty acids 1000 MG capsule Take 1 g by mouth 2 (two) times daily.        . hydrochlorothiazide 25 MG tablet Take 25 mg by mouth daily.        Marland Kitchen HYDROcodone-acetaminophen (VICODIN) 5-500 MG per tablet Take 1/2 tablet every 8 hours as needed  90 tablet  1  . levothyroxine (SYNTHROID, LEVOTHROID) 25 MCG  tablet Take 25 mcg by mouth daily.       Marland Kitchen losartan (COZAAR) 50 MG tablet Take 50 mg by mouth daily.       . Multiple Vitamins-Minerals (OCUVITE PRESERVISION) TABS Take 1 tablet by mouth daily.        . ranitidine (ZANTAC) 150 MG capsule Take 150 mg by mouth as needed.           Past Medical History  Diagnosis Date  . Atrial fibrillation 04/23-24/2007    [facility] AFib, CT chest negative, CVTS/ P.E.  . Hypertrophic cardiomyopathy   . Anemia, iron deficiency   . Degenerative joint disease     back  . Urinary incontinence   . Diverticulosis of colon (without mention of hemorrhage) 2003/ 08/2000    EGD/colonoscopy Barretts esophagus//H.H divertics 08/2000  . Anemia   . Patellar fracture     right  . Cervical mass     C2 lateral mass fracture  . Hypertension   . Hypercholesterolemia     219/497  . Hypothyroidism   . Multinodular goiter (nontoxic)   . Osteoporosis   . DJD (degenerative joint disease)     back    Past Surgical History  Procedure Date  . Tonsillectomy   . Breast reduction surgery   . Bladder surgery     bladder tack early 90's  . Dilation and curettage, diagnostic / therapeutic 09/07/00    endometrial polyps removed, path all benign   . Cataract extraction 04/03  .  Tear duct probing 07/29/03    tear duck surg  . Cystourethroscopy 09/17/03  . Rotator cuff repair 09/07/05    right rotator cuff surg( Dr. Darrelyn Hillock)  . Appendectomy 1941  . Breast surgery 1981    breast reduction  . Eye surgery 03/2002    cataract OS  . Thyroid ultrasound 10/14/2003    MNG, no dominant masses  . Doppler echocardiography 03/05/2002&09/11/2003    ECHO, EF wnl, mild stenosis, A.S. mild MR, Mild T.R03/31/2003//ECHO EF 70%,LVH, ?diast dysfunction 09/11/2003    History   Social History  . Marital Status: Married    Spouse Name: N/A    Number of Children: N/A  . Years of Education: N/A   Occupational History  . Not on file.   Social History Main Topics  . Smoking status:  Former Smoker -- 0.5 packs/day for 4 years    Quit date: 07/06/1974  . Smokeless tobacco: Never Used   Comment: quit in 1975  . Alcohol Use: No  . Drug Use: No  . Sexually Active: No   Other Topics Concern  . 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 Remaining systems are negative.  Physical Exam: Well-developed well-nourished in no acute distress.  Skin is warm and dry.  HEENT is normal.  Neck is supple. No thyromegaly.  Chest is clear to auscultation with normal expansion.  Cardiovascular exam is regular rate and rhythm. 3/6 systolic murmur left sternal border that increases with Valsalva. Abdominal exam nontender or distended. No masses palpated. Extremities show no edema. neuro grossly intact  ECG NSR, septal MI, nonspecific ST changes

## 2011-10-04 NOTE — Assessment & Plan Note (Signed)
Symptoms from hypertrophic cardiomyopathy are reasonably well controlled. Continue Cardizem.

## 2011-10-04 NOTE — Patient Instructions (Signed)
Your physician wants you to follow-up in:  6 months. You will receive a reminder letter in the mail two months in advance. If you don't receive a letter, please call our office to schedule the follow-up appointment.   

## 2011-10-04 NOTE — Assessment & Plan Note (Signed)
Patient remains in sinus rhythm.continue Cardizem. Continue aspirin. Not a Coumadin candidate given her history of falls. She did not tolerate amiodarone. If her episodes of atrial fibrillation become more frequent we consider a different antiarrhythmic such as disopyramide or tikosyn.

## 2011-10-04 NOTE — Assessment & Plan Note (Signed)
Blood pressure mildly elevated. However she checks this at home and it is typically normal. Continue present medications.

## 2011-11-24 ENCOUNTER — Telehealth: Payer: Self-pay | Admitting: *Deleted

## 2011-11-24 NOTE — Telephone Encounter (Signed)
I don't know who ordered this program. Classic case of the PMD inheriting the paperwork of caring for a patient. Will sign.

## 2011-11-24 NOTE — Telephone Encounter (Signed)
Received form requesting to sign and fax back if okay for evaluation. Requesting to allow [facility] nursing and occupational therapy to evaluate and treat patient for disease process management & low vision program. Form is in your in box.

## 2011-11-26 ENCOUNTER — Telehealth: Payer: Self-pay | Admitting: *Deleted

## 2011-11-26 NOTE — Telephone Encounter (Signed)
Pt was to have OT and nursing work with her on macular degeneration, but pt wants to wait until after Jan 1st for them to come out. Nurse wanted you to know pt didn't refuse service she just declined it as of right now.

## 2011-11-28 NOTE — Telephone Encounter (Signed)
Noted  

## 2011-12-09 ENCOUNTER — Telehealth: Payer: Self-pay | Admitting: Internal Medicine

## 2011-12-09 NOTE — Telephone Encounter (Signed)
[facility]

## 2011-12-15 ENCOUNTER — Telehealth: Payer: Self-pay | Admitting: Internal Medicine

## 2011-12-15 DIAGNOSIS — H539 Unspecified visual disturbance: Secondary | ICD-10-CM | POA: Insufficient documentation

## 2011-12-15 NOTE — Telephone Encounter (Signed)
Opened in error by Pam Clement 

## 2011-12-15 NOTE — Telephone Encounter (Signed)
Please send order for Occupational low vision therapy for 2x a week for 4 weeks.   Dx: [phone]

## 2011-12-15 NOTE — Telephone Encounter (Signed)
Amy the Occupational Therapist from [facility] and needs orders for Occupational low vision therapy for 2x a week for 4 weeks.   Number: [phone]

## 2011-12-17 ENCOUNTER — Encounter: Payer: Self-pay | Admitting: *Deleted

## 2011-12-27 ENCOUNTER — Encounter: Payer: Self-pay | Admitting: Family Medicine

## 2011-12-27 DIAGNOSIS — I10 Essential (primary) hypertension: Secondary | ICD-10-CM

## 2011-12-27 DIAGNOSIS — H3530 Unspecified macular degeneration: Secondary | ICD-10-CM | POA: Insufficient documentation

## 2011-12-27 DIAGNOSIS — H543 Unqualified visual loss, both eyes: Secondary | ICD-10-CM

## 2011-12-27 DIAGNOSIS — I4891 Unspecified atrial fibrillation: Secondary | ICD-10-CM

## 2011-12-29 DIAGNOSIS — H543 Unqualified visual loss, both eyes: Secondary | ICD-10-CM

## 2011-12-29 DIAGNOSIS — I10 Essential (primary) hypertension: Secondary | ICD-10-CM

## 2011-12-29 DIAGNOSIS — I4891 Unspecified atrial fibrillation: Secondary | ICD-10-CM

## 2011-12-29 DIAGNOSIS — H3530 Unspecified macular degeneration: Secondary | ICD-10-CM

## 2012-01-17 ENCOUNTER — Encounter: Payer: Self-pay | Admitting: Family Medicine

## 2012-01-26 ENCOUNTER — Inpatient Hospital Stay (HOSPITAL_COMMUNITY)
Admission: EM | Admit: 2012-01-26 | Discharge: 2012-01-27 | DRG: 812 | Disposition: A | Discharge status: Home / Self Care | Payer: Medicare Other | Attending: Internal Medicine | Admitting: Internal Medicine

## 2012-01-26 ENCOUNTER — Encounter (HOSPITAL_COMMUNITY): Payer: Self-pay | Admitting: *Deleted

## 2012-01-26 ENCOUNTER — Observation Stay (HOSPITAL_COMMUNITY): Payer: Medicare Other

## 2012-01-26 ENCOUNTER — Emergency Department (HOSPITAL_COMMUNITY): Payer: Medicare Other

## 2012-01-26 ENCOUNTER — Other Ambulatory Visit: Payer: Self-pay

## 2012-01-26 DIAGNOSIS — I428 Other cardiomyopathies: Secondary | ICD-10-CM

## 2012-01-26 DIAGNOSIS — S8000XA Contusion of unspecified knee, initial encounter: Secondary | ICD-10-CM

## 2012-01-26 DIAGNOSIS — R0609 Other forms of dyspnea: Secondary | ICD-10-CM

## 2012-01-26 DIAGNOSIS — K449 Diaphragmatic hernia without obstruction or gangrene: Secondary | ICD-10-CM

## 2012-01-26 DIAGNOSIS — G479 Sleep disorder, unspecified: Secondary | ICD-10-CM

## 2012-01-26 DIAGNOSIS — I421 Obstructive hypertrophic cardiomyopathy: Secondary | ICD-10-CM | POA: Diagnosis present

## 2012-01-26 DIAGNOSIS — K922 Gastrointestinal hemorrhage, unspecified: Secondary | ICD-10-CM | POA: Diagnosis present

## 2012-01-26 DIAGNOSIS — D509 Iron deficiency anemia, unspecified: Principal | ICD-10-CM

## 2012-01-26 DIAGNOSIS — R209 Unspecified disturbances of skin sensation: Secondary | ICD-10-CM

## 2012-01-26 DIAGNOSIS — E042 Nontoxic multinodular goiter: Secondary | ICD-10-CM

## 2012-01-26 DIAGNOSIS — M810 Age-related osteoporosis without current pathological fracture: Secondary | ICD-10-CM

## 2012-01-26 DIAGNOSIS — I10 Essential (primary) hypertension: Secondary | ICD-10-CM

## 2012-01-26 DIAGNOSIS — F32A Depression, unspecified: Secondary | ICD-10-CM

## 2012-01-26 DIAGNOSIS — K5730 Diverticulosis of large intestine without perforation or abscess without bleeding: Secondary | ICD-10-CM

## 2012-01-26 DIAGNOSIS — F329 Major depressive disorder, single episode, unspecified: Secondary | ICD-10-CM

## 2012-01-26 DIAGNOSIS — M1990 Unspecified osteoarthritis, unspecified site: Secondary | ICD-10-CM

## 2012-01-26 DIAGNOSIS — I422 Other hypertrophic cardiomyopathy: Secondary | ICD-10-CM | POA: Diagnosis present

## 2012-01-26 DIAGNOSIS — Z7982 Long term (current) use of aspirin: Secondary | ICD-10-CM

## 2012-01-26 DIAGNOSIS — D649 Anemia, unspecified: Secondary | ICD-10-CM

## 2012-01-26 DIAGNOSIS — I4891 Unspecified atrial fibrillation: Secondary | ICD-10-CM

## 2012-01-26 DIAGNOSIS — H539 Unspecified visual disturbance: Secondary | ICD-10-CM

## 2012-01-26 DIAGNOSIS — J309 Allergic rhinitis, unspecified: Secondary | ICD-10-CM

## 2012-01-26 DIAGNOSIS — M25539 Pain in unspecified wrist: Secondary | ICD-10-CM

## 2012-01-26 DIAGNOSIS — R32 Unspecified urinary incontinence: Secondary | ICD-10-CM

## 2012-01-26 DIAGNOSIS — E039 Hypothyroidism, unspecified: Secondary | ICD-10-CM

## 2012-01-26 DIAGNOSIS — M549 Dorsalgia, unspecified: Secondary | ICD-10-CM

## 2012-01-26 DIAGNOSIS — Z87891 Personal history of nicotine dependence: Secondary | ICD-10-CM

## 2012-01-26 DIAGNOSIS — H3530 Unspecified macular degeneration: Secondary | ICD-10-CM

## 2012-01-26 HISTORY — DX: Major depressive disorder, single episode, unspecified: F32.9

## 2012-01-26 HISTORY — DX: Unspecified jaundice: R17

## 2012-01-26 HISTORY — DX: Depression, unspecified: F32.A

## 2012-01-26 LAB — FERRITIN: Ferritin: 5 ng/mL — ABNORMAL LOW (ref 10–291)

## 2012-01-26 LAB — CBC
HCT: 25.1 % — ABNORMAL LOW (ref 36.0–46.0)
Hemoglobin: 7 g/dL — ABNORMAL LOW (ref 12.0–15.0)
RDW: 16.1 % — ABNORMAL HIGH (ref 11.5–15.5)
WBC: 4.7 10*3/uL (ref 4.0–10.5)

## 2012-01-26 LAB — BASIC METABOLIC PANEL
BUN: 12 mg/dL (ref 6–23)
Chloride: 105 mEq/L (ref 96–112)
GFR calc Af Amer: 85 mL/min — ABNORMAL LOW (ref >90)
Potassium: 3.8 mEq/L (ref 3.5–5.1)
Sodium: 138 mEq/L (ref 135–145)

## 2012-01-26 LAB — RETICULOCYTES
RBC.: 3.36 MIL/uL — ABNORMAL LOW (ref 3.87–5.11)
Retic Count, Absolute: 67.2 10*3/uL (ref 19.0–186.0)
Retic Ct Pct: 2 % (ref 0.4–3.1)

## 2012-01-26 LAB — VITAMIN B12: Vitamin B-12: 356 pg/mL (ref 211–911)

## 2012-01-26 LAB — DIFFERENTIAL
Basophils Absolute: 0 10*3/uL (ref 0.0–0.1)
Basophils Relative: 0 % (ref 0–1)
Lymphocytes Relative: 27 % (ref 12–46)
Monocytes Relative: 11 % (ref 3–12)
Neutro Abs: 2.9 10*3/uL (ref 1.7–7.7)
Neutrophils Relative %: 61 % (ref 43–77)

## 2012-01-26 LAB — IRON AND TIBC

## 2012-01-26 LAB — LACTATE DEHYDROGENASE: LDH: 221 U/L (ref 94–250)

## 2012-01-26 LAB — OCCULT BLOOD, POC DEVICE: Fecal Occult Bld: NEGATIVE

## 2012-01-26 LAB — POCT I-STAT TROPONIN I

## 2012-01-26 MED ORDER — ASPIRIN 325 MG PO TABS
325.0000 mg | ORAL_TABLET | Freq: Every day | ORAL | Status: DC
  Filled 2012-01-26 (×2): qty 1

## 2012-01-26 MED ORDER — ZOLPIDEM TARTRATE 5 MG PO TABS
5.0000 mg | ORAL_TABLET | Freq: Every evening | ORAL | Status: DC | PRN
  Administered 2012-01-26: 5 mg via ORAL
  Filled 2012-01-26: qty 1

## 2012-01-26 MED ORDER — HYDROCHLOROTHIAZIDE 12.5 MG PO CAPS
12.5000 mg | ORAL_CAPSULE | Freq: Every day | ORAL | Status: DC
  Administered 2012-01-27: 12.5 mg via ORAL
  Filled 2012-01-26 (×2): qty 1

## 2012-01-26 MED ORDER — ACETAMINOPHEN 650 MG RE SUPP: 650.0000 mg | Freq: Four times a day (QID) | RECTAL | Status: DC | PRN

## 2012-01-26 MED ORDER — PROSIGHT PO TABS
1.0000 | ORAL_TABLET | Freq: Every day | ORAL | Status: DC
  Administered 2012-01-26 – 2012-01-27 (×2): 1 via ORAL
  Filled 2012-01-26 (×2): qty 1

## 2012-01-26 MED ORDER — HYDROCODONE-ACETAMINOPHEN 5-325 MG PO TABS: 1.0000 | ORAL_TABLET | ORAL | Status: DC | PRN

## 2012-01-26 MED ORDER — OMEGA-3 FATTY ACIDS 1000 MG PO CAPS: 1.0000 g | ORAL_CAPSULE | Freq: Two times a day (BID) | ORAL | Status: DC

## 2012-01-26 MED ORDER — FAMOTIDINE 20 MG PO TABS
20.0000 mg | ORAL_TABLET | Freq: Every day | ORAL | Status: DC
  Administered 2012-01-26 – 2012-01-27 (×2): 20 mg via ORAL
  Filled 2012-01-26 (×2): qty 1

## 2012-01-26 MED ORDER — SODIUM CHLORIDE 0.9 % IV SOLN
INTRAVENOUS | Status: AC
  Administered 2012-01-26: 16:00:00 via INTRAVENOUS

## 2012-01-26 MED ORDER — DILTIAZEM HCL ER COATED BEADS 180 MG PO CP24
180.0000 mg | ORAL_CAPSULE | Freq: Every evening | ORAL | Status: DC
  Administered 2012-01-26: 180 mg via ORAL
  Filled 2012-01-26 (×2): qty 1

## 2012-01-26 MED ORDER — HYDROCHLOROTHIAZIDE 25 MG PO TABS: 12.5000 mg | ORAL_TABLET | Freq: Every day | ORAL | Status: DC

## 2012-01-26 MED ORDER — LEVOTHYROXINE SODIUM 25 MCG PO TABS
25.0000 ug | ORAL_TABLET | Freq: Every morning | ORAL | Status: DC
  Administered 2012-01-27: 25 ug via ORAL
  Filled 2012-01-26: qty 1

## 2012-01-26 MED ORDER — FUROSEMIDE 10 MG/ML IJ SOLN
20.0000 mg | Freq: Once | INTRAMUSCULAR | Status: AC
  Administered 2012-01-27: 20 mg via INTRAVENOUS
  Filled 2012-01-26: qty 2

## 2012-01-26 MED ORDER — LOSARTAN POTASSIUM 50 MG PO TABS
50.0000 mg | ORAL_TABLET | Freq: Every day | ORAL | Status: DC
  Administered 2012-01-27: 50 mg via ORAL
  Filled 2012-01-26: qty 1

## 2012-01-26 MED ORDER — OCUVITE PRESERVISION PO TABS
1.0000 | ORAL_TABLET | Freq: Every day | ORAL | Status: DC
Start: 2012-01-26 — End: 2012-01-26

## 2012-01-26 MED ORDER — ACETAMINOPHEN 325 MG PO TABS: 650.0000 mg | ORAL_TABLET | Freq: Four times a day (QID) | ORAL | Status: DC | PRN

## 2012-01-26 MED ORDER — OMEGA-3-ACID ETHYL ESTERS 1 G PO CAPS
1.0000 g | ORAL_CAPSULE | Freq: Two times a day (BID) | ORAL | Status: DC
  Administered 2012-01-27: 1 g via ORAL
  Filled 2012-01-26 (×3): qty 1

## 2012-01-26 MED ORDER — FUROSEMIDE 10 MG/ML IJ SOLN
20.0000 mg | Freq: Once | INTRAMUSCULAR | Status: AC
  Administered 2012-01-27: 20 mg via INTRAVENOUS
  Filled 2012-01-26 (×3): qty 2

## 2012-01-26 NOTE — ED Notes (Signed)
Pt's name listed in muse Cindy [name]), is not the same as listed for this visit. MRN and birth date is the same. Confirmed both names with patient. She goes by both.

## 2012-01-26 NOTE — ED Provider Notes (Signed)
History     CSN: [id number]  Arrival date & time 01/26/12  0908   First MD Initiated Contact with Patient 01/26/12 1018      Chief Complaint  Patient presents with  . Back Pain    chronic pain    (Consider location/radiation/quality/duration/timing/severity/associated sxs/prior treatment) Patient is a 78 y.o. female presenting with back pain. The history is provided by the patient.  Back Pain  Associated symptoms include weakness. Pertinent negatives include no chest pain, no fever, no headaches, no abdominal pain and no dysuria.   the patient is an 78 year old, female, who complains of intermittent back pain.  She says that the back pain increases, if she rolls over, while she is in bed.  She does not have any pain now.  She denies trauma.  She denies urinary tract symptoms.  She also states that she has dyspnea on exertion.  She states when she walks to the mailbox.  She gets extremely short of breath and has to stop in order to recover. She denies chest pain.  She has not had a cough, fevers, chills, nausea, vomiting, sweating, leg pain or swelling.  She denies a history of coronary artery disease, or prior diagnosis of congestive heart failure.  Past Medical History  Diagnosis Date  . Atrial fibrillation 04/23-24/2007    [facility] AFib, CT chest negative, CVTS/ P.E.  . Hypertrophic cardiomyopathy   . Anemia, iron deficiency   . Degenerative joint disease     back  . Urinary incontinence   . Diverticulosis of colon (without mention of hemorrhage) 2003/ 08/2000    EGD/colonoscopy Barretts esophagus//H.H divertics 08/2000  . Anemia   . Patellar fracture     right  . Cervical mass     C2 lateral mass fracture  . Hypertension   . Hypercholesterolemia     219/497  . Hypothyroidism   . Multinodular goiter (nontoxic)   . Osteoporosis   . DJD (degenerative joint disease)     back    Past Surgical History  Procedure Date  . Tonsillectomy   . Breast reduction surgery   .  Bladder surgery     bladder tack early 90's  . Dilation and curettage, diagnostic / therapeutic 09/07/00    endometrial polyps removed, path all benign   . Cataract extraction 04/03  . Tear duct probing 07/29/03    tear duck surg  . Cystourethroscopy 09/17/03  . Rotator cuff repair 09/07/05    right rotator cuff surg( Dr. Darrelyn Hillock)  . Appendectomy 1941  . Breast surgery 1981    breast reduction  . Eye surgery 03/2002    cataract OS  . Thyroid ultrasound 10/14/2003    MNG, no dominant masses  . Doppler echocardiography 03/05/2002&09/11/2003    ECHO, EF wnl, mild stenosis, A.S. mild MR, Mild T.R03/31/2003//ECHO EF 70%,LVH, ?diast dysfunction 09/11/2003    Family History  Problem Relation Age of Onset  . Heart failure Mother     CHF, DM, HBP  . Hypertension Mother   . Cancer Mother     uterine cancer  . Stroke Mother     History  Substance Use Topics  . Smoking status: Former Smoker -- 0.5 packs/day for 4 years    Quit date: 07/06/1974  . Smokeless tobacco: Never Used   Comment: quit in 1975  . Alcohol Use: No    OB History    Grav Para Term Preterm Abortions TAB SAB Ect Mult Living  Review of Systems  Constitutional: Negative for fever and chills.  Respiratory: Positive for shortness of breath. Negative for cough and chest tightness.   Cardiovascular: Positive for palpitations. Negative for chest pain and leg swelling.  Gastrointestinal: Negative for nausea, vomiting and abdominal pain.  Genitourinary: Negative for dysuria and hematuria.  Musculoskeletal: Positive for back pain.  Neurological: Positive for weakness. Negative for headaches.  Psychiatric/Behavioral: Negative for confusion.  All other systems reviewed and are negative.    Allergies  Irbesartan; Levothyroxine sodium; Ramipril; and Telmisartan-hctz  Home Medications   Current Outpatient Rx  Name Route Sig Dispense Refill  . ASPIRIN 325 MG PO TABS Oral Take 325 mg by mouth  daily.      Marland Kitchen DILTIAZEM HCL ER COATED BEADS 180 MG PO CP24 Oral Take 180 mg by mouth every evening.     Marland Kitchen OMEGA-3 FATTY ACIDS 1000 MG PO CAPS Oral Take 1 g by mouth 2 (two) times daily.      Marland Kitchen HYDROCHLOROTHIAZIDE 25 MG PO TABS Oral Take 12.5 mg by mouth daily.     Marland Kitchen HYDROCODONE-ACETAMINOPHEN 5-500 MG PO TABS Oral Take 0.5 tablets by mouth every 8 (eight) hours as needed. For pain    . LEVOTHYROXINE SODIUM 25 MCG PO TABS Oral Take 25 mcg by mouth every morning.     Marland Kitchen LOSARTAN POTASSIUM 50 MG PO TABS Oral Take 50 mg by mouth daily.     Idolina Primer PRESERVISION PO TABS Oral Take 1 tablet by mouth daily.      Marland Kitchen RANITIDINE HCL 150 MG PO CAPS Oral Take 150 mg by mouth daily as needed. For heartburn     . ZOLPIDEM TARTRATE 10 MG PO TABS Oral Take 5 mg by mouth at bedtime as needed. For sleep      BP 144/120  Pulse 67  Temp(Src) 97.7 F (36.5 C) (Oral)  Resp 16  SpO2 97%  Physical Exam  Vitals reviewed. Constitutional: She is oriented to person, place, and time. She appears well-developed and well-nourished.  HENT:  Head: Normocephalic and atraumatic.  Eyes: Pupils are equal, round, and reactive to light.  Neck: Normal range of motion.  Cardiovascular: Normal rate, regular rhythm and normal heart sounds.   No murmur heard. Pulmonary/Chest: Effort normal. No respiratory distress. She has no wheezes. She has rales.       Rales bilateral bases posteriorly  Abdominal: Soft. She exhibits no distension and no mass. There is no tenderness. There is no rebound and no guarding.  Genitourinary: Guaiac negative stool.       Rectal examination performed with the nurse chaperone  Musculoskeletal: Normal range of motion. She exhibits no edema and no tenderness.  Neurological: She is alert and oriented to person, place, and time. No cranial nerve deficit.  Skin: Skin is warm and dry. No rash noted. No erythema.  Psychiatric: She has a normal mood and affect. Her behavior is normal.    ED Course    Procedures (including critical care time)   Labs Reviewed  CBC  DIFFERENTIAL  BASIC METABOLIC PANEL   No results found.   No diagnosis found.  ED ECG REPORT   Date: 01/26/2012  EKG Time: 12:23 PM  Rate: 67  Rhythm: normal sinus rhythm,    Axis: nl  Intervals:none  ST&T Change: nonspecific  Narrative Interpretation: nsr with nonspecific st changes          1:56 PM Spoke with Dr. Lavera Guise.  He will admit for further eval.  MDM  Back pain Dyspnea on exertion        Nicholes Stairs, MD 01/26/12 1356

## 2012-01-26 NOTE — ED Notes (Signed)
Patient returned from xray.

## 2012-01-26 NOTE — H&P (Signed)
PCP:   Crawford Givens, MD, MD   Chief Complaint:  weak  HPI: 78 yo woman with hx of multiple medical problems, presented for extreme fatigue and back pain. In the ED she was found to have anemia with 4 gram hemoglobin drop. The patient denies melena, hematemesis, hematochezia, hematuria or vaginal bleeding. She denies chest pain or dyspnea. She reports that the back pain is sharp and localized over the T 8 vertebra. She denies recent trauma.  She reports similar problems 5 years ago for which she had endoscopy and colonoscopy.   Review of Systems:  The patient denies anorexia, fever, weight loss,, vision loss, decreased hearing, hoarseness, chest pain, syncope, dyspnea on exertion, peripheral edema, balance deficits, hemoptysis, abdominal pain, melena, hematochezia, severe indigestion/heartburn, hematuria, incontinence, genital sores, suspicious skin lesions, transient blindness, difficulty walking,  Past Medical History: Past Medical History  Diagnosis Date  . Atrial fibrillation 04/23-24/2007    [facility] AFib, CT chest negative, CVTS/ P.E.  . Hypertrophic cardiomyopathy   . Anemia, iron deficiency   . Degenerative joint disease     back  . Urinary incontinence   . Diverticulosis of colon (without mention of hemorrhage) 2003/ 08/2000    EGD/colonoscopy Barretts esophagus//H.H divertics 08/2000  . Anemia   . Patellar fracture     right  . Cervical mass     C2 lateral mass fracture  . Hypertension   . Hypercholesterolemia     219/497  . Hypothyroidism   . Multinodular goiter (nontoxic)   . Osteoporosis   . DJD (degenerative joint disease)     back   Past Surgical History  Procedure Date  . Tonsillectomy   . Breast reduction surgery   . Bladder surgery     bladder tack early 90's  . Dilation and curettage, diagnostic / therapeutic 09/07/00    endometrial polyps removed, path all benign   . Cataract extraction 04/03  . Tear duct probing 07/29/03    tear duck surg  .  Cystourethroscopy 09/17/03  . Rotator cuff repair 09/07/05    right rotator cuff surg( Dr. Darrelyn Hillock)  . Appendectomy 1941  . Breast surgery 1981    breast reduction  . Eye surgery 03/2002    cataract OS  . Thyroid ultrasound 10/14/2003    MNG, no dominant masses  . Doppler echocardiography 03/05/2002&09/11/2003    ECHO, EF wnl, mild stenosis, A.S. mild MR, Mild T.R03/31/2003//ECHO EF 70%,LVH, ?diast dysfunction 09/11/2003    Medications: Prior to Admission medications   Medication Sig Start Date End Date Taking? Authorizing Provider  aspirin 325 MG tablet Take 325 mg by mouth daily.     Yes Historical Provider, MD  diltiazem (CARDIZEM CD) 180 MG 24 hr capsule Take 180 mg by mouth every evening.    Yes Historical Provider, MD  fish oil-omega-3 fatty acids 1000 MG capsule Take 1 g by mouth 2 (two) times daily.     Yes Historical Provider, MD  hydrochlorothiazide 25 MG tablet Take 12.5 mg by mouth daily.    Yes Historical Provider, MD  HYDROcodone-acetaminophen (VICODIN) 5-500 MG per tablet Take 0.5 tablets by mouth every 8 (eight) hours as needed. For pain 09/29/11  Yes Laurita Quint, MD  levothyroxine (SYNTHROID, LEVOTHROID) 25 MCG tablet Take 25 mcg by mouth every morning.  08/05/11  Yes Laurita Quint, MD  losartan (COZAAR) 50 MG tablet Take 50 mg by mouth daily.    Yes Historical Provider, MD  Multiple Vitamins-Minerals (OCUVITE PRESERVISION) TABS Take 1 tablet by mouth daily.  Yes Historical Provider, MD  ranitidine (ZANTAC) 150 MG capsule Take 150 mg by mouth daily as needed. For heartburn    Yes Historical Provider, MD  zolpidem (AMBIEN) 10 MG tablet Take 5 mg by mouth at bedtime as needed. For sleep   Yes Historical Provider, MD    Allergies:   Allergies  Allergen Reactions  . Irbesartan     REACTION: swelling  . Levothyroxine Sodium     REACTION: lip swelling  . Ramipril     REACTION: lips swelling  . Telmisartan-Hctz     REACTION: incontinence    Social  History:  reports that she quit smoking about 37 years ago. She has never used smokeless tobacco. She reports that she does not drink alcohol or use illicit drugs.  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 Heart failure Mother     CHF, DM, HBP  . Hypertension Mother   . Cancer Mother     uterine cancer  . Stroke Mother     Physical Exam: Filed Vitals:   01/26/12 1212 01/26/12 1523 01/26/12 1552 01/26/12 1659  BP: 158/92 121/96 117/46 152/37  Pulse: 64 81 67 80  Temp: 97.8 F (36.6 C)  98.2 F (36.8 C) 98 F (36.7 C)  TempSrc: Oral  Oral Oral  Resp: 14 16 20 20   SpO2: 99% 100% 97% 96%   General appearance: alert, cooperative, appears stated age and no distress Head: Normocephalic, without obvious abnormality, atraumatic Eyes: conjunctivae/corneas clear. PERRL, EOM's intact. Fundi benign. Nose: Nares normal. Septum midline. Mucosa normal. No drainage or sinus tenderness. Throat: lips, mucosa, and tongue normal; teeth and gums normal Neck: no adenopathy, no carotid bruit, no JVD, supple, symmetrical, trachea midline and thyroid not enlarged, symmetric, no tenderness/mass/nodules Back: tender over T 8 vertebra Resp: clear to auscultation bilaterally Chest wall: no tenderness Cardio: systolic murmur: holosystolic 5/6, harsh at apex GI: soft, non-tender; bowel sounds normal; no masses,  no organomegaly Female genitalia: normal Extremities: extremities normal, atraumatic, no cyanosis or edema Pulses: 2+ and symmetric Skin: pale Neurologic: Alert and oriented X 3, normal strength and tone. Normal symmetric reflexes. Normal coordination and gait   Labs on Admission:   [facility] 01/26/12 1153  NA 138  K 3.8  CL 105  CO2 24  GLUCOSE 97  BUN 12    CREATININE 0.78  CALCIUM 8.5  MG --  PHOS --    Basename 01/26/12 1153  WBC 4.7  NEUTROABS 2.9  HGB 7.0*  HCT 25.1*  MCV 72.5*  PLT 252    Basename 01/26/12 1358  VITAMINB12 --  FOLATE --  FERRITIN --  TIBC --  IRON --  RETICCTPCT 2.0    Radiological Exams on Admission: Dg Chest 2 View  01/26/2012  *RADIOLOGY REPORT*  Clinical Data: 78 year old female with shortness of breath and abnormal pulmonary auscultation.  CHEST - 2 VIEW  Comparison: 08/23/2011 and earlier.  Findings: Chronic hiatal hernia.  Small bilateral pleural effusions more apparent on the left.  No pneumothorax, pulmonary edema or consolidation.  Stable cardiac size and mediastinal contours. Chronic exaggerated thoracic kyphosis and osteopenia. No acute osseous abnormality identified.  IMPRESSION: Small bilateral pleural effusions.  No other acute cardiopulmonary abnormality.  Chronic hiatal hernia.  Original Report Authenticated  By: H.LEE HALL III, M.D.    Assessment/Plan  78 yo woman who presents with symptomatic anemia . Based on prior history and low MCV this is probably iron deficiency anemia due to unrecognized chronic GI losses in the setting of aspirin usage and hiatal hernia.  Plan is to observe overnight and obtain anemia panel, spep, ldh, hapto and transfuse 2 units of prbcs  Back pain - probably DDD T spine - will check X ray to r/o vertebral compression fractur e- vicodin prn for pain   Hx of afib, HOCM - no change in meds  Present on Admission:  .GOITER, MULTINODULAR .HYPOTHYROIDISM .ANEMIA, IRON DEFICIENCY .HYPERTENSION .HYPERTROPHIC CARDIOMYOPATHY .Atrial fibrillation .DEGENERATIVE JOINT DISEASE, BACK  Dang Mathison 01/26/2012, 5:27 PM

## 2012-01-26 NOTE — ED Notes (Signed)
Patient transported to x-ray. ?

## 2012-01-26 NOTE — ED Notes (Signed)
Pat, RN covering for Audrey, RN for lunch break.  

## 2012-01-26 NOTE — ED Notes (Signed)
Patient undressed and in a gown. Cardiac monitor, blood pressure cuff and pulse ox on.

## 2012-01-26 NOTE — ED Notes (Signed)
Meal tray ordered for pt.  Pt denies any needs at this time.  Friend at bedside.  Pt aware of plan to admit.

## 2012-01-26 NOTE — ED Notes (Signed)
Called report.  Pt ok for transport.  Pt just started meal

## 2012-01-26 NOTE — ED Notes (Signed)
PT reports increased pain to back and legs

## 2012-01-27 LAB — CBC
HCT: 33 % — ABNORMAL LOW (ref 36.0–46.0)
MCH: 22.5 pg — ABNORMAL LOW (ref 26.0–34.0)
MCHC: 30.3 g/dL (ref 30.0–36.0)
MCV: 74.3 fL — ABNORMAL LOW (ref 78.0–100.0)
RDW: 16.3 % — ABNORMAL HIGH (ref 11.5–15.5)

## 2012-01-27 MED ORDER — LIDOCAINE 5 % EX PTCH: 1.0000 | MEDICATED_PATCH | CUTANEOUS | Status: AC

## 2012-01-27 MED ORDER — TANDEM PLUS 162-115.2-1 MG PO CAPS: 1.0000 | ORAL_CAPSULE | Freq: Two times a day (BID) | ORAL | Status: DC

## 2012-01-27 MED ORDER — ROPINIROLE HCL 0.25 MG PO TABS: 0.2500 mg | ORAL_TABLET | Freq: Three times a day (TID) | ORAL | Status: DC | PRN

## 2012-01-27 NOTE — Progress Notes (Signed)
Patient discharged to home in care of son. Medications and instructions reviewed with patient and family with no questions. IV d/c'd with cath intact. Assessment unchanged from this am. Patient is to follow up with Dr. Para March in 1 week.

## 2012-01-28 ENCOUNTER — Ambulatory Visit: Payer: Medicare Other | Admitting: Cardiology

## 2012-01-28 LAB — PROTEIN ELECTROPHORESIS, SERUM
Albumin ELP: 56.7 % (ref 55.8–66.1)
Alpha-1-Globulin: 5.5 % — ABNORMAL HIGH (ref 2.9–4.9)
Beta 2: 6 % (ref 3.2–6.5)

## 2012-01-28 LAB — TYPE AND SCREEN
ABO/RH(D): A NEG
Antibody Screen: POSITIVE
Unit division: 0

## 2012-01-30 ENCOUNTER — Telehealth: Payer: Self-pay | Admitting: Family Medicine

## 2012-01-30 NOTE — Telephone Encounter (Signed)
Patient has OV for 02/04/12.  Can recheck CBC at that point.

## 2012-01-30 NOTE — Discharge Summary (Signed)
Patient ID: Cindy [name] MRN: [id number] DOB/AGE: Feb 09, 1922 78 y.o. Primary Care Physician:Graham Para March, MD, MD Admit date: 01/26/2012 Discharge date: 01/30/2012   Issues to follow up : 1. Needs colonoscopy if mor ethan 10 years since the last one 2. Follow up CBC   Discharge Diagnoses:   Active Problems:  GOITER, MULTINODULAR  HYPOTHYROIDISM  ANEMIA, IRON DEFICIENCY  HYPERTENSION  HYPERTROPHIC CARDIOMYOPATHY  Atrial fibrillation  DEGENERATIVE JOINT DISEASE, BACK   Medication List  As of 01/30/2012  7:25 AM   START taking these medications         lidocaine 5 %   Commonly known as: LIDODERM   Place 1 patch onto the skin daily. Remove & Discard patch within 12 hours or as directed by MD      rOPINIRole 0.25 MG tablet   Commonly known as: REQUIP   Take 1 tablet (0.25 mg total) by mouth 3 (three) times daily as needed (for leg cramps ).      Tandem Plus 162-115.2-1 MG Caps   Take 1 tablet by mouth 2 (two) times daily with a meal.         CONTINUE taking these medications         aspirin 325 MG tablet      diltiazem 180 MG 24 hr capsule   Commonly known as: CARDIZEM CD      fish oil-omega-3 fatty acids 1000 MG capsule      hydrochlorothiazide 25 MG tablet   Commonly known as: HYDRODIURIL      HYDROcodone-acetaminophen 5-500 MG per tablet   Commonly known as: VICODIN      levothyroxine 25 MCG tablet   Commonly known as: SYNTHROID, LEVOTHROID      losartan 50 MG tablet   Commonly known as: COZAAR      Ocuvite PreserVision Tabs      ranitidine 150 MG capsule   Commonly known as: ZANTAC      zolpidem 10 MG tablet   Commonly known as: AMBIEN          Where to get your medications    These are the prescriptions that you need to pick up.   You may get these medications from any pharmacy.         lidocaine 5 %   rOPINIRole 0.25 MG tablet   Tandem Plus 162-115.2-1 MG Caps            Discharged  Condition:good   Consults:none   Significant Diagnostic Studies: Dg Chest 2 View  01/26/2012  *RADIOLOGY REPORT*  Clinical Data: 78 year old female with shortness of breath and abnormal pulmonary auscultation.  CHEST - 2 VIEW  Comparison: 08/23/2011 and earlier.  Findings: Chronic hiatal hernia.  Small bilateral pleural effusions more apparent on the left.  No pneumothorax, pulmonary edema or consolidation.  Stable cardiac size and mediastinal contours. Chronic exaggerated thoracic kyphosis and osteopenia. No acute osseous abnormality identified.  IMPRESSION: Small bilateral pleural effusions.  No other acute cardiopulmonary abnormality.  Chronic hiatal hernia.  Original Report Authenticated By: Harley Hallmark, M.D.   Dg Thoracic Spine 2 View  01/26/2012  *RADIOLOGY REPORT*  Clinical Data: Thoracic pain.  THORACIC SPINE - 2 VIEW  Comparison: 01/26/2012 chest radiograph  Findings: Atherosclerotic calcification of the aortic arch noted in the with retrocardiac density compatible with hiatal hernia.  Bony demineralization is present along with levoconvex thoracolumbar scoliosis and prominent thoracic kyphosis.  The blunted left posterior costophrenic angle is noted, new with bridging spurring in the mid  and lower thoracic spine anteriorly.  No thoracic vertebral collapse of the is noted.  No definite fracture is identified.  IMPRESSION: 1.  Thoracic spondylosis, especially in the lower thoracic spine, with associated kyphosis and mild thoracolumbar scoliosis.  2.  No thoracic spine fracture is observed.  3.  Hiatal hernia.  4.  Atherosclerosis.  Original Report Authenticated By: Dellia Cloud, M.D.    Lab Results: No results found for this or any previous visit (from the past 48 hour(s)). No results found for this or any previous visit (from the past 240 hour(s)).   Hospital Course:  78 yo woman with remote hx of iron deficiency anemia, HOCM, presents with subacute dyspnea and fatigue. Found o  have severe anemia. Hemoccult negative stools and not actively bleeding.  Workup confirmed iron deficiency anemia - feritin was 5. She probably has chronic GI blood loss vs iron malabsorbtions problems. She was transfused  2 units of PRBCS and hemoglobin improved form 7 grams to 10 grams . She was placed on po iron at DC She c/o RLS type symptoms - which i suspect is due to anemia. I have prescribed requip prn for now.  She also c/o back pain - T spine X ray was negative for fracture.    Discharge Exam: Blood pressure 135/61, pulse 66, temperature 97.7 F (36.5 C), temperature source Oral, resp. rate 18, height 5' (1.524 m), weight 63.957 kg (141 lb), SpO2 100.00%. Alert and oriented x3 CVS: RRR RS: CTAB  Disposition: home  Discharge Orders    Future Appointments: Provider: Department: Dept Phone: Center:   02/04/2012 11:00 AM Joaquim Nam, MD [facility] [phone] [id number]     Future Orders Please Complete By Expires   Diet - low sodium heart healthy      Increase activity slowly         Follow-up Information    Follow up with Crawford Givens, MD. Schedule an appointment as soon as possible for a visit in 1 week.         Signed: Justun Anaya 01/30/2012, 7:26 AM

## 2012-02-04 ENCOUNTER — Encounter: Payer: Self-pay | Admitting: Family Medicine

## 2012-02-04 ENCOUNTER — Ambulatory Visit (INDEPENDENT_AMBULATORY_CARE_PROVIDER_SITE_OTHER): Payer: Medicare Other | Admitting: Family Medicine

## 2012-02-04 ENCOUNTER — Other Ambulatory Visit: Payer: Self-pay | Admitting: *Deleted

## 2012-02-04 VITALS — BP 166/58 | HR 69 | Temp 98.2°F | Wt 140.0 lb

## 2012-02-04 DIAGNOSIS — D509 Iron deficiency anemia, unspecified: Secondary | ICD-10-CM

## 2012-02-04 DIAGNOSIS — G479 Sleep disorder, unspecified: Secondary | ICD-10-CM

## 2012-02-04 DIAGNOSIS — G478 Other sleep disorders: Secondary | ICD-10-CM

## 2012-02-04 DIAGNOSIS — D649 Anemia, unspecified: Secondary | ICD-10-CM

## 2012-02-04 LAB — CBC WITH DIFFERENTIAL/PLATELET
Basophils Absolute: 0.1 10*3/uL (ref 0.0–0.1)
Eosinophils Absolute: 0 10*3/uL (ref 0.0–0.7)
Lymphocytes Relative: 24.6 % (ref 12.0–46.0)
MCHC: 30.6 g/dL (ref 30.0–36.0)
MCV: 74.5 fl — ABNORMAL LOW (ref 78.0–100.0)
Monocytes Absolute: 0.6 10*3/uL (ref 0.1–1.0)
Neutrophils Relative %: 63.7 % (ref 43.0–77.0)
Platelets: 259 10*3/uL (ref 150.0–400.0)
RBC: 4.19 Mil/uL (ref 3.87–5.11)
RDW: 23.6 % — ABNORMAL HIGH (ref 11.5–14.6)

## 2012-02-04 MED ORDER — ZOLPIDEM TARTRATE 10 MG PO TABS: 5.0000 mg | ORAL_TABLET | Freq: Every evening | ORAL | Status: DC | PRN

## 2012-02-04 MED ORDER — HYDROCHLOROTHIAZIDE 25 MG PO TABS: 12.5000 mg | ORAL_TABLET | Freq: Every day | ORAL | Status: DC

## 2012-02-04 NOTE — Patient Instructions (Signed)
You can get your results through our phone system.  Follow the instructions on the blue card. See Shirlee Limerick about your referral before you leave today.

## 2012-02-04 NOTE — Progress Notes (Signed)
Prev with profound DOE, fatigue. Admitted with anemia and likely GI loss.  Transfused and much improved.  RLS sx are likely related to low hbg, better in meantime.  She had heme neg stools in hospital. No known/visible blood in stool.  No vaginal bleeding.  H/o iron def anemia years ago.  Last colonoscopy done years ago.  H/o hiatal hernia but no recent sx.  Still taking 325 mg ASA but no other nsaids.    No CP, orthostasis, blood in stool, vomiting.    Insomnia continues, controlled with ambien prn w/o ADE.  Good effect.   Meds, vitals, and allergies reviewed.   ROS: See HPI.  Otherwise, noncontributory.  Hospital recs reviewed.   nad ncat Mmm rrr with murmur noted ctab abd soft, not ttp Ext w/o edema

## 2012-02-06 ENCOUNTER — Encounter: Payer: Self-pay | Admitting: Family Medicine

## 2012-02-06 NOTE — Assessment & Plan Note (Signed)
Check CBC, seen notes on labs, then f/u with GI.  She agrees.  >25 min spent with face to face with patient, >50% counseling and/or coordinating care

## 2012-02-06 NOTE — Assessment & Plan Note (Signed)
Continue ambien.  No ADE.  

## 2012-02-09 ENCOUNTER — Ambulatory Visit (INDEPENDENT_AMBULATORY_CARE_PROVIDER_SITE_OTHER): Payer: Medicare Other | Admitting: Physician Assistant

## 2012-02-09 ENCOUNTER — Encounter: Payer: Self-pay | Admitting: Physician Assistant

## 2012-02-09 DIAGNOSIS — I10 Essential (primary) hypertension: Secondary | ICD-10-CM

## 2012-02-09 DIAGNOSIS — D509 Iron deficiency anemia, unspecified: Secondary | ICD-10-CM

## 2012-02-09 DIAGNOSIS — I421 Obstructive hypertrophic cardiomyopathy: Secondary | ICD-10-CM

## 2012-02-09 DIAGNOSIS — I4891 Unspecified atrial fibrillation: Secondary | ICD-10-CM

## 2012-02-09 NOTE — Progress Notes (Signed)
[address] [place], [place]  [address] Phone: [phone] Fax:  [phone]  Date:  02/09/2012   Name:  Cindy [name]       DOB:  29-Apr-1922 MRN:  [id number]  PCP:  Dr. Para March Primary Cardiologist:  Dr. Olga Millers  Primary Electrophysiologist:  None    History of Present Illness: Cindy [name] is a 78 y.o. female who presents for post hospital follow up.  She has a history of paroxysmal atrial fibrillation and hypertrophic obstructive cardiomyopathy physiology. Myoview performed in November of 2007 showed an ejection fraction of 70%, normal perfusion and mild soft tissue attenuation. Her last echocardiogram was performed in Sept 2012. This revealed moderate LVH. There was severe focal basal hypertrophy of the septum. Systolic function was normal. The estimated ejection fraction was in the range of 60% to 65%. There was dynamic obstruction in the outflow tract, with a peak velocity of 4.5cm/sec and a peak gradient of 82mm Hg. Wall motion was normal; there were no regional wall motion abnormalities. Doppler parameters are consistent with high ventricular filling pressure.There was mild aortic stenosis. Trivial regurgitation. Mild MR. The left atrium was moderately dilated. Pulmonary Systolic pressure was mildly increased.  She was on Amiodarone for treatment of AFib but stopped due to side effects.  She is not on coumadin due to high fall risk.  Last seen by Dr. Olga Millers in 10/12.  Disopyramide vs Tikosyn was mentioned as an alternative Rx if she had recurrent AFib.    She was admitted 01/2023/02/29 with severe anemia.  Hemoglobin was 7.  She was transfused with 2 units packed red blood cells.  Workup demonstrated iron deficiency anemia.  She saw her PCP 3/1.  She has been referred to gastroenterology.  She denies chest pain.  She denies significant DOE.  She feels much better since she was transfused for her anemia.  No syncope.  No near syncope. No  orthopnea, PND or edema.  She can likely achieve 4 METs without chest pain or dyspnea.    Past Medical History  Diagnosis Date  . Atrial fibrillation 04/23-24/2007    [facility] AFib, CT chest negative, CVTS/ P.E.  . Hypertrophic cardiomyopathy   . Urinary incontinence   . Diverticulosis of colon (without mention of hemorrhage) 2003/ 08/2000    EGD/colonoscopy Barretts esophagus//H.H divertics 08/2000  . Cervical mass     C2 lateral mass fracture  . Hypertension   . Hypercholesterolemia     219/497  . Hypothyroidism   . Multinodular goiter (nontoxic)   . Osteoporosis   . Blood transfusion   . Jaundice ~ 1935    "in grade school"  . Degenerative joint disease     back  . Depression     "husband died 2011-02-03"  . Anemia, iron deficiency     Current Outpatient Prescriptions  Medication Sig Dispense Refill  . aspirin 325 MG tablet Take 325 mg by mouth daily.        Marland Kitchen diltiazem (CARDIZEM CD) 180 MG 24 hr capsule Take 180 mg by mouth every evening.       Marland Kitchen FeFum-FePo-FA-B Cmp-C-Zn-Mn-Cu (TANDEM PLUS) 162-115.2-1 MG CAPS Take 1 tablet by mouth 2 (two) times daily with a meal.  60 each  3  . fish oil-omega-3 fatty acids 1000 MG capsule Take 1 g by mouth 2 (two) times daily.        . hydrochlorothiazide (HYDRODIURIL) 25 MG tablet Take 0.5 tablets (12.5 mg total) by mouth daily.  90 tablet  3  . HYDROcodone-acetaminophen (VICODIN) 5-500 MG per tablet Take 0.5 tablets by mouth every 8 (eight) hours as needed. For pain      . levothyroxine (SYNTHROID, LEVOTHROID) 25 MCG tablet Take 25 mcg by mouth every morning.       . lidocaine (LIDODERM) 5 % Place 1 patch onto the skin daily. Remove & Discard patch within 12 hours or as directed by MD  6 patch  0  . losartan (COZAAR) 50 MG tablet Take 50 mg by mouth daily.       . Multiple Vitamins-Minerals (OCUVITE PRESERVISION) TABS Take 1 tablet by mouth daily.        . ranitidine (ZANTAC) 150 MG capsule Take 150 mg by mouth daily as needed. For  heartburn       . rOPINIRole (REQUIP) 0.25 MG tablet Take 1 tablet (0.25 mg total) by mouth 3 (three) times daily as needed (for leg cramps ).  30 tablet  0  . zolpidem (AMBIEN) 10 MG tablet Take 0.5 tablets (5 mg total) by mouth at bedtime as needed. For sleep  90 tablet  1    Allergies: Allergies  Allergen Reactions  . Irbesartan     REACTION: swelling  . Levothyroxine Sodium     REACTION: lip swelling  . Ramipril     REACTION: lips swelling  . Telmisartan-Hctz     REACTION: incontinence    History  Substance Use Topics  . Smoking status: Former Smoker -- 0.5 packs/day for 4 years    Types: Cigarettes    Quit date: 07/06/1974  . Smokeless tobacco: Never Used  . Alcohol Use: No     ROS:  Please see the history of present illness.   No melena, hematochezia, hematemesis.   All other systems reviewed and negative.   PHYSICAL EXAM: VS:  BP 136/60  Pulse 76  Ht 4\' 10"  (1.473 m)  Wt 140 lb (63.504 kg)  BMI 29.26 kg/m2 Well nourished, well developed, in no acute distress HEENT: normal Neck: no JVD Cardiac:  normal S1, S2; RRR; 2/6 systolic murmur along the LSB Lungs:  Faint crackles in the basese bilaterally, no wheezing, rhonchi   Abd: soft, nontender, no hepatomegaly Ext: no edema Skin: warm and dry Neuro:  CNs 2-12 intact, no focal abnormalities noted  EKG:  Sinus rhythm, heart rate 77, left axis deviation, poor R-wave progression, LVH, no significant change since prior tracing  ASSESSMENT AND PLAN:  1. HYPERTROPHIC CARDIOMYOPATHY  Recent echo stable.  She is not that symptomatic from her HOCM.  She became more symptomatic when her Hgb was 7.  She will likely need EGD and colonoscopy.  She does not require any further cardiac workup to her potential procedures.  She should be at acceptable risk.  Follow up with Dr. Olga Millers in 2 mos.      2. ANEMIA, IRON DEFICIENCY  Workup pending.   3. Atrial fibrillation  Maintaining NSR.  She is not on coumadin due to  fall risk.  She is on ASA.  The ASA can be held if necessary for her procedure.   4. HYPERTENSION  Controlled.  Continue current therapy.      Signed, Tereso Newcomer, PA-C  10:59 AM 02/09/2012

## 2012-02-09 NOTE — Patient Instructions (Signed)
Your physician recommends that you schedule a follow-up appointment in: 2 mths with Dr Jens Som

## 2012-02-16 ENCOUNTER — Other Ambulatory Visit: Payer: Self-pay | Admitting: Family Medicine

## 2012-02-22 ENCOUNTER — Encounter: Payer: Self-pay | Admitting: Gastroenterology

## 2012-02-22 ENCOUNTER — Ambulatory Visit (INDEPENDENT_AMBULATORY_CARE_PROVIDER_SITE_OTHER): Payer: Medicare Other | Admitting: Gastroenterology

## 2012-02-22 VITALS — BP 128/72 | HR 89 | Ht <= 58 in | Wt 139.6 lb

## 2012-02-22 DIAGNOSIS — K2270 Barrett's esophagus without dysplasia: Secondary | ICD-10-CM

## 2012-02-22 DIAGNOSIS — D509 Iron deficiency anemia, unspecified: Secondary | ICD-10-CM

## 2012-02-22 MED ORDER — PEG-KCL-NACL-NASULF-NA ASC-C 100 G PO SOLR: 1.0000 | Freq: Once | ORAL | Status: DC

## 2012-02-22 NOTE — Patient Instructions (Signed)
Your Colonoscopy/Endoscopy is scheduled on 02/29/2012 at 2 pm  Separate instructions have been given No IRON 5 days before your procedures

## 2012-02-22 NOTE — Assessment & Plan Note (Signed)
Plan upper endoscopy 

## 2012-02-22 NOTE — Progress Notes (Signed)
History of Present Illness: Mr. Cindy [name] is a pleasant 78 year old white female referred at the request of Dr. Para March for evaluation of anemia. She was hospitalized in February, 2013 for symptomatic anemia. Ferritin was 5 and hemoglobin was 7. She was transfused 2 units to a hemoglobin of 10. Stool hemoccults were negative. She denies GI complaints including change of bowel habits, abdominal pain, melena or hematochezia. She is on no gastric irritants.  Last colonoscopy was in 2001. She has a history of Barrett's esophagus.   Past Medical History  Diagnosis Date  . Atrial fibrillation 04/23-24/2007    [facility] AFib, CT chest negative, CVTS/ P.E.  . Hypertrophic cardiomyopathy   . Urinary incontinence   . Diverticulosis of colon (without mention of hemorrhage) 2003/ 08/2000    EGD/colonoscopy Barretts esophagus//H.H divertics 08/2000  . Cervical mass     C2 lateral mass fracture  . Hypertension   . Hypercholesterolemia     219/497  . Hypothyroidism   . Multinodular goiter (nontoxic)   . Osteoporosis   . Blood transfusion   . Jaundice ~ 1935    "in grade school"  . Degenerative joint disease     back  . Depression     "husband died 01/31/2011"  . Anemia, iron deficiency    Past Surgical History  Procedure Date  . Bladder surgery     bladder tack early 90's  . Tear duct probing 07/29/03    tear duck surg  . Cystourethroscopy 09/17/03  . Rotator cuff repair ? date; 09/07/05    left; right( Dr. Darrelyn Hillock)  . Appendectomy 1941  . Breast surgery 1981    breast reduction  . Eye surgery 03/2002    cataract OS  . Thyroid ultrasound 10/14/2003    MNG, no dominant masses  . Doppler echocardiography 03/05/2002&09/11/2003    ECHO, EF wnl, mild stenosis, A.S. mild MR, Mild T.R03/31/2003//ECHO EF 70%,LVH, ?diast dysfunction 09/11/2003  . Cataract extraction w/ intraocular lens  implant, bilateral 2003  . Dilation and curettage of uterus 09/07/2000    endometrial polyps removed, path all benign   .  Tonsillectomy and adenoidectomy     "as a child"  . Fracture surgery 2010    right knee   family history includes Cancer in her mother; Heart failure in her mother; Hypertension in her mother; and Stroke in her mother. Current Outpatient Prescriptions  Medication Sig Dispense Refill  . aspirin 325 MG tablet Take 325 mg by mouth daily.        Marland Kitchen diltiazem (CARDIZEM CD) 180 MG 24 hr capsule TAKE 1 CAPSULE DAILY  90 capsule  1  . FeFum-FePo-FA-B Cmp-C-Zn-Mn-Cu (TANDEM PLUS) 162-115.2-1 MG CAPS Take 1 tablet by mouth 2 (two) times daily with a meal.  60 each  3  . fish oil-omega-3 fatty acids 1000 MG capsule Take 1 g by mouth 2 (two) times daily.        . hydrochlorothiazide (HYDRODIURIL) 25 MG tablet Take 0.5 tablets (12.5 mg total) by mouth daily.  90 tablet  3  . HYDROcodone-acetaminophen (VICODIN) 5-500 MG per tablet Take 0.5 tablets by mouth every 8 (eight) hours as needed. For pain      . levothyroxine (SYNTHROID, LEVOTHROID) 25 MCG tablet Take 25 mcg by mouth every morning.       . lidocaine (LIDODERM) 5 % Place 1 patch onto the skin daily. Remove & Discard patch within 12 hours or as directed by MD  6 patch  0  . losartan (COZAAR) 50 MG  tablet Take 50 mg by mouth daily.       . Multiple Vitamins-Minerals (OCUVITE PRESERVISION) TABS Take 1 tablet by mouth daily.        . ranitidine (ZANTAC) 150 MG capsule Take 150 mg by mouth daily as needed. For heartburn       . rOPINIRole (REQUIP) 0.25 MG tablet Take 1 tablet (0.25 mg total) by mouth 3 (three) times daily as needed (for leg cramps ).  30 tablet  0  . zolpidem (AMBIEN) 10 MG tablet Take 0.5 tablets (5 mg total) by mouth at bedtime as needed. For sleep  90 tablet  1   Allergies as of 02/22/2012 - Review Complete 02/22/2012  Allergen Reaction Noted  . Irbesartan    . Levothyroxine sodium  12/16/2006  . Ramipril    . Telmisartan-hctz      reports that she quit smoking about 37 years ago. Her smoking use included Cigarettes. She has a 2  pack-year smoking history. She has never used smokeless tobacco. She reports that she does not drink alcohol or use illicit drugs.     Review of Systems: Pertinent positive and negative review of systems were noted in the above HPI section. All other review of systems were otherwise negative.  Vital signs were reviewed in today's medical record Physical Exam: General: Well developed , well nourished, no acute distress Head: Normocephalic and atraumatic Eyes:  sclerae anicteric, EOMI Ears: Normal auditory acuity Mouth: No deformity or lesions Neck: Supple, no masses or thyromegaly Lungs: Clear throughout to auscultation Heart: Regular rate and rhythm; no murmurs, rubs or bruits Abdomen: Soft, non tender and non distended. No masses, hepatosplenomegaly or hernias noted. Normal Bowel sounds Rectal:deferred Musculoskeletal: Symmetrical with no gross deformities  Skin: No lesions on visible extremities Pulses:  Normal pulses noted Extremities: No clubbing, cyanosis, edema or deformities noted Neurological: Alert oriented x 4, grossly nonfocal Cervical Nodes:  No significant cervical adenopathy Inguinal Nodes: No significant inguinal adenopathy Psychological:  Alert and cooperative. Normal mood and affect

## 2012-02-22 NOTE — Assessment & Plan Note (Signed)
Symptomatic iron deficiency anemia, presumably due to chronic GI blood loss. Malabsorption is less likely.  Etiologies including neoplasm, AVMs, polyps and ulcer disease should be ruled out.  Recommendations #1 colonoscopy and upper endoscopy-to be done at the same time

## 2012-02-23 ENCOUNTER — Telehealth: Payer: Self-pay | Admitting: Gastroenterology

## 2012-02-23 ENCOUNTER — Encounter: Payer: Self-pay | Admitting: Gastroenterology

## 2012-02-23 MED ORDER — PEG-KCL-NACL-NASULF-NA ASC-C 100 G PO SOLR: 1.0000 | Freq: Once | ORAL | Status: DC

## 2012-02-25 ENCOUNTER — Telehealth: Payer: Self-pay | Admitting: Gastroenterology

## 2012-02-25 NOTE — Telephone Encounter (Signed)
Spoke with Patient. She uses [facility] on [address]. Not in [place]   She was ok when I spoke with her, I did apologize to her for the confusion

## 2012-02-28 NOTE — Telephone Encounter (Signed)
Message answered CMA opened another phone note.Marland KitchenMarland Kitchen

## 2012-02-29 ENCOUNTER — Encounter: Payer: Self-pay | Admitting: Gastroenterology

## 2012-02-29 ENCOUNTER — Ambulatory Visit (AMBULATORY_SURGERY_CENTER): Payer: Medicare Other | Admitting: Gastroenterology

## 2012-02-29 VITALS — BP 145/71 | HR 66 | Temp 98.1°F | Resp 15 | Ht <= 58 in | Wt 139.0 lb

## 2012-02-29 DIAGNOSIS — D128 Benign neoplasm of rectum: Secondary | ICD-10-CM

## 2012-02-29 DIAGNOSIS — D126 Benign neoplasm of colon, unspecified: Secondary | ICD-10-CM

## 2012-02-29 DIAGNOSIS — D129 Benign neoplasm of anus and anal canal: Secondary | ICD-10-CM

## 2012-02-29 DIAGNOSIS — K2270 Barrett's esophagus without dysplasia: Secondary | ICD-10-CM

## 2012-02-29 DIAGNOSIS — D509 Iron deficiency anemia, unspecified: Secondary | ICD-10-CM

## 2012-02-29 DIAGNOSIS — Z860100 Personal history of colon polyps, unspecified: Secondary | ICD-10-CM

## 2012-02-29 HISTORY — DX: Personal history of colonic polyps: Z86.010

## 2012-02-29 HISTORY — DX: Personal history of colon polyps, unspecified: Z86.0100

## 2012-02-29 MED ORDER — SODIUM CHLORIDE 0.9 % IV SOLN: 500.0000 mL | INTRAVENOUS | Status: DC

## 2012-02-29 NOTE — Patient Instructions (Addendum)

## 2012-02-29 NOTE — Op Note (Signed)
[facility] [address] [place], [place]  [address]  COLONOSCOPY PROCEDURE REPORT  PATIENT:  Cindy [name], Cindy [name]  MR#:  [id number] BIRTHDATE:  1922-04-25, 89 yrs. old  GENDER:  female ENDOSCOPIST:  Barbette Hair. Arlyce Dice, MD REF. BY:  Crawford Givens, M.D. PROCEDURE DATE:  02/29/2012 PROCEDURE:  Colonoscopy with polypectomy and submucosal injection, Colonoscopy with snare polypectomy ASA CLASS:  Class II INDICATIONS:  Iron deficiency anemia MEDICATIONS:   MAC sedation, administered by CRNA propofol 200mg IV  DESCRIPTION OF PROCEDURE:   After the risks benefits and alternatives of the procedure were thoroughly explained, informed consent was obtained.  Digital rectal exam was performed and revealed no abnormalities.   The [id number] endoscope was introduced through the anus and advanced to the cecum, which was identified by both the appendix and ileocecal valve, without limitations.  The quality of the prep was excellent, using MoviPrep.  The instrument was then slowly withdrawn as the colon was fully examined. <<PROCEDUREIMAGES>>  FINDINGS:  A sessile polyp was found in the cecum. It was 2 cm in size. submucosal injection 9cc was injected submucosally in and around the polyp. Polyp was removed piecemeal with hot polypectomy snare and submitted to pathology. Fringe of polyp was removed with hot biopsy forceps (see image4 and image7).  Severe diverticulosis was found in the sigmoid colon (see image8).  A sessile polyp was found in the rectum. It was 3 mm in size. It was found 4 cm from the point of entry. Polyp was snared without cautery. Retrieval was successful (see image9). snare polyp  This was otherwise a normal examination of the colon (see image2 and image10). Retroflexed views in the rectum revealed no abnormalities.    The time to cecum =  1) 5.0  minutes. The scope was then withdrawn in 1) 25.0  minutes from the cecum and the procedure completed. COMPLICATIONS:   None ENDOSCOPIC IMPRESSION: 1) 2 cm sessile polyp in the cecum 2) 3 mm sessile polyp in the rectum 3) Severe diverticulosis in the sigmoid colon 4) Otherwise normal examination RECOMMENDATIONS: 1) Await biopsy results REPEAT EXAM:  You will receive a letter from Dr. Arlyce Dice in 1-2 weeks, after reviewing the final pathology, with followup recommendations.  ______________________________ Barbette Hair Arlyce Dice, MD  CC:  n. eSIGNED:   Barbette Hair. Eliya Geiman at 02/29/2012 03:29 PM  Melvenia Beam, [phone]

## 2012-02-29 NOTE — Progress Notes (Signed)
Patient did not experience any of the following events: a burn prior to discharge; a fall within the facility; wrong site/side/patient/procedure/implant event; or a hospital transfer or hospital admission upon discharge from the facility. (G8907) Patient did not have preoperative order for IV antibiotic SSI prophylaxis. (G8918)  

## 2012-02-29 NOTE — Op Note (Signed)
[facility] [address] [place], [place]  [address]  ENDOSCOPY PROCEDURE REPORT  PATIENT:  Cindy [name], Cindy [name]  MR#:  [id number] BIRTHDATE:  30-Mar-1922, 89 yrs. old  GENDER:  female  ENDOSCOPIST:  Barbette Hair. Arlyce Dice, MD Referred by:  Crawford Givens, M.D.  PROCEDURE DATE:  02/29/2012 PROCEDURE:  EGD with biopsy, 43239 ASA CLASS:  Class II INDICATIONS:  iron deficiency anemia, h/o Barrett's Esophagus  MEDICATIONS:   There was residual sedation effect present from prior procedure., MAC sedation, administered by CRNA propofol 50mg IV, glycopyrrolate (Robinal) 0.2 mg IV, 0.6cc simethancone 0.6 cc PO TOPICAL ANESTHETIC:  DESCRIPTION OF PROCEDURE:   After the risks and benefits of the procedure were explained, informed consent was obtained.  The [facility] [id number] endoscope was introduced through the mouth and advanced to the third portion of the duodenum.  The instrument was slowly withdrawn as the mucosa was fully examined. <<PROCEDUREIMAGES>>  Barrett's esophagus was found. Z line at 23cm from incisors. Barrett's appearing epithelium extending to the distal esophagus. Multiple biopsies at 4 levels were taken (see image3 and image4). A hiatal hernia was found. 8cm sliding hiatal hernia  Otherwise the examination was normal (see image1 and image2).    Retroflexed views revealed no abnormalities.    The scope was then withdrawn from the patient and the procedure completed.  COMPLICATIONS:  None  ENDOSCOPIC IMPRESSION: 1) Barrett's esophagus 2) Hiatal hernia 3) Otherwise normal examination RECOMMENDATIONS: 1) hemmoccult stools in 2 weeks 2) Await biopsy results  ______________________________ Barbette Hair. Arlyce Dice, MD  CC:  n. eSIGNED:   Barbette Hair. Caydence Koenig at 02/29/2012 03:33 PM  Melvenia Beam, [phone]

## 2012-03-01 ENCOUNTER — Telehealth: Payer: Self-pay | Admitting: *Deleted

## 2012-03-01 NOTE — Telephone Encounter (Signed)
  Follow up Call-  Call back number 02/29/2012  Post procedure Call Back phone  # [phone]  Permission to leave phone message Yes     Patient questions:  Do you have a fever, pain , or abdominal swelling? no Pain Score  0 *  Have you tolerated food without any problems? yes  Have you been able to return to your normal activities? yes  Do you have any questions about your discharge instructions: Diet   no Medications  no Follow up visit  no  Do you have questions or concerns about your Care? no  Actions: * If pain score is 4 or above: No action needed, pain <4.

## 2012-03-07 ENCOUNTER — Encounter: Payer: Self-pay | Admitting: Family Medicine

## 2012-03-08 ENCOUNTER — Encounter: Payer: Self-pay | Admitting: Gastroenterology

## 2012-03-20 ENCOUNTER — Other Ambulatory Visit: Payer: Medicare Other

## 2012-03-20 LAB — HEMOCCULT SLIDES (X 3 CARDS)
Fecal Occult Blood: NEGATIVE
OCCULT 1: NEGATIVE
OCCULT 2: NEGATIVE
OCCULT 3: NEGATIVE

## 2012-03-21 ENCOUNTER — Telehealth: Payer: Self-pay

## 2012-03-21 DIAGNOSIS — D649 Anemia, unspecified: Secondary | ICD-10-CM

## 2012-03-21 NOTE — Telephone Encounter (Signed)
Pt scheduled to see Dr. Arlyce Dice 04/03/12@2 :30pm. Pt to have cbc prior to OV. Pt aware of appt date and time.

## 2012-03-21 NOTE — Telephone Encounter (Signed)
Message copied by Chrystie Nose on Tue Mar 21, 2012  2:29 PM ------      Message from: Melvia Heaps D      Created: Mon Mar 20, 2012  4:43 PM       She needs a CBC and return office visit

## 2012-04-03 ENCOUNTER — Other Ambulatory Visit (HOSPITAL_COMMUNITY): Payer: Self-pay | Admitting: General Practice

## 2012-04-03 ENCOUNTER — Ambulatory Visit (INDEPENDENT_AMBULATORY_CARE_PROVIDER_SITE_OTHER): Payer: Medicare Other | Admitting: Gastroenterology

## 2012-04-03 ENCOUNTER — Other Ambulatory Visit (INDEPENDENT_AMBULATORY_CARE_PROVIDER_SITE_OTHER): Payer: Medicare Other

## 2012-04-03 ENCOUNTER — Encounter: Payer: Self-pay | Admitting: Gastroenterology

## 2012-04-03 VITALS — BP 136/46 | HR 66 | Ht 60.0 in | Wt 137.0 lb

## 2012-04-03 DIAGNOSIS — D126 Benign neoplasm of colon, unspecified: Secondary | ICD-10-CM | POA: Insufficient documentation

## 2012-04-03 DIAGNOSIS — D509 Iron deficiency anemia, unspecified: Secondary | ICD-10-CM

## 2012-04-03 DIAGNOSIS — D649 Anemia, unspecified: Secondary | ICD-10-CM

## 2012-04-03 LAB — CBC WITH DIFFERENTIAL/PLATELET
Basophils Relative: 0.2 % (ref 0.0–3.0)
Eosinophils Relative: 0.8 % (ref 0.0–5.0)
Lymphocytes Relative: 22.1 % (ref 12.0–46.0)
MCV: 71.9 fl — ABNORMAL LOW (ref 78.0–100.0)
Monocytes Absolute: 0.6 10*3/uL (ref 0.1–1.0)
Monocytes Relative: 10.5 % (ref 3.0–12.0)
Neutrophils Relative %: 66.4 % (ref 43.0–77.0)
RBC: 3.88 Mil/uL (ref 3.87–5.11)
WBC: 5.3 10*3/uL (ref 4.5–10.5)

## 2012-04-03 NOTE — Assessment & Plan Note (Addendum)
Etiology for iron deficiency is unclear. The patient claims that she had a similar problem 10 years ago. There is no evidence for overt or occult GI blood loss. Her diet is excellent rendering nutrition as an unlikely etiology. There are no symptoms to suggest malabsorption.  Recommendations #1 repeat CBC #2 iron infusion (patient does not tolerate oral iron) #3 followup Hemoccult

## 2012-04-03 NOTE — Assessment & Plan Note (Signed)
Nonbleeding polyps removed 2013.  Recommendations #1 in view of the patient's age I would not do routine colonoscopy

## 2012-04-03 NOTE — Patient Instructions (Signed)
You will go to the basement for labs today You will pick a IFOB kit from the lab which needs to be returned in 2 weeks Your IRON INFUSION is scheduled at Short Stay on 04/13/2012 at 8:30am

## 2012-04-03 NOTE — Assessment & Plan Note (Signed)
No dysplasia by recent biopsies. In view of the patient's age we'll no longer do routine surveillance.

## 2012-04-03 NOTE — Progress Notes (Signed)
History of Present Illness:  Cindy [name] has returned following upper endoscopy and colonoscopy. Barrett's esophagus was seen in the former. Nonbleeding colon polyps were seen and removed in the latter. Her main complaint is fatigue. She denies melena or hematochezia. She does not tolerate oral iron. Recent Hemoccults were negative.    Review of Systems: Pertinent positive and negative review of systems were noted in the above HPI section. All other review of systems were otherwise negative.    Current Medications, Allergies, Past Medical History, Past Surgical History, Family History and Social History were reviewed in [facility] electronic medical record  Vital signs were reviewed in today's medical record. Physical Exam: General: Well developed , well nourished, no acute distress

## 2012-04-05 ENCOUNTER — Telehealth: Payer: Self-pay | Admitting: Gastroenterology

## 2012-04-05 NOTE — Telephone Encounter (Signed)
See result note.  

## 2012-04-12 ENCOUNTER — Encounter: Payer: Self-pay | Admitting: Cardiology

## 2012-04-12 ENCOUNTER — Encounter: Payer: Self-pay | Admitting: Gastroenterology

## 2012-04-12 ENCOUNTER — Ambulatory Visit (INDEPENDENT_AMBULATORY_CARE_PROVIDER_SITE_OTHER): Payer: Medicare Other | Admitting: Cardiology

## 2012-04-12 VITALS — BP 160/70 | HR 72 | Wt 136.0 lb

## 2012-04-12 DIAGNOSIS — I4891 Unspecified atrial fibrillation: Secondary | ICD-10-CM

## 2012-04-12 DIAGNOSIS — I10 Essential (primary) hypertension: Secondary | ICD-10-CM

## 2012-04-12 DIAGNOSIS — I421 Obstructive hypertrophic cardiomyopathy: Secondary | ICD-10-CM

## 2012-04-12 NOTE — Progress Notes (Signed)
HPI: Pleasant female for fu of paroxysmal atrial fibrillation and hypertrophic obstructive cardiomyopathy physiology. Myoview performed in November of 2007 showed an ejection fraction of 70%, normal perfusion and mild soft tissue attenuation. Her last echocardiogram was performed in Sept 2012. This revealed moderate LVH. There was severe focal basal hypertrophy of the septum. Systolic function was normal. The estimated ejection fraction was in the range of 60% to 65%. There was dynamic obstruction in the outflow tract, with a peak velocity of 4.5cm/sec and a peak gradient of 82mm Hg. Wall motion was normal; there were no regional wall motion abnormalities. Doppler parameters are consistent with high ventricular filling pressure.There was mild aortic stenosis. Trivial regurgitation. Mild MR. The left atrium was moderately dilated. Pulmonary Systolic pressure was mildly increased. She was on Amiodarone for treatment of AFib but stopped due to side effects. She is not on coumadin due to high fall risk. She was admitted 2/February 20, 2023 with severe anemia. Hemoglobin was 7. GI workup has revealed Barrett's esophagus and nonbleeding polyps which were removed. Since she was last seen, she does have some dyspnea on exertion. No orthopnea, PND, pedal edema, syncope or chest pain. Most recent hemoglobin 8.5.   Current Outpatient Prescriptions  Medication Sig Dispense Refill  . aspirin 325 MG tablet Take 325 mg by mouth daily.        Marland Kitchen diltiazem (CARDIZEM CD) 180 MG 24 hr capsule TAKE 1 CAPSULE DAILY  90 capsule  1  . fish oil-omega-3 fatty acids 1000 MG capsule Take 1 g by mouth 2 (two) times daily.        . hydrochlorothiazide (HYDRODIURIL) 25 MG tablet Take 0.5 tablets (12.5 mg total) by mouth daily.  90 tablet  3  . HYDROcodone-acetaminophen (VICODIN) 5-500 MG per tablet Take 0.5 tablets by mouth every 8 (eight) hours as needed. For pain      . levothyroxine (SYNTHROID, LEVOTHROID) 25 MCG tablet Take 25 mcg by  mouth every morning.       Marland Kitchen losartan (COZAAR) 50 MG tablet Take 50 mg by mouth daily.       . Multiple Vitamins-Minerals (OCUVITE PRESERVISION) TABS Take 1 tablet by mouth daily.        . ranitidine (ZANTAC) 150 MG capsule Take 150 mg by mouth daily as needed. For heartburn       . rOPINIRole (REQUIP) 0.25 MG tablet Take 1 tablet (0.25 mg total) by mouth 3 (three) times daily as needed (for leg cramps ).  30 tablet  0  . zolpidem (AMBIEN) 10 MG tablet Take 0.5 tablets (5 mg total) by mouth at bedtime as needed. For sleep  90 tablet  1     Past Medical History  Diagnosis Date  . Atrial fibrillation 04/23-24/2007    [facility] AFib, CT chest negative, CVTS/ P.E.  . Hypertrophic cardiomyopathy   . Urinary incontinence   . Diverticulosis of colon (without mention of hemorrhage) 2003/ 08/2000    EGD/colonoscopy Barretts esophagus//H.H divertics 08/2000  . Cervical mass     C2 lateral mass fracture  . Hypertension   . Hypercholesterolemia     219/497  . Hypothyroidism   . Multinodular goiter (nontoxic)   . Osteoporosis   . Blood transfusion   . Jaundice ~ 1935    "in grade school"  . Degenerative joint disease     back  . Depression     "husband died 02-20-2011"  . Anemia, iron deficiency   . Personal history of colonic polyps 02/29/2012  tubular adenoma  . Barrett's esophagus     Past Surgical History  Procedure Date  . Bladder surgery     bladder tack early 90's  . Tear duct probing 07/29/03    tear duck surg  . Cystourethroscopy 09/17/03  . Rotator cuff repair ? date; 09/07/05    left; right( Dr. Darrelyn Hillock)  . Appendectomy 1941  . Breast surgery 1981    breast reduction  . Eye surgery 03/2002    cataract OS  . Thyroid ultrasound 10/14/2003    MNG, no dominant masses  . Doppler echocardiography 03/05/2002&09/11/2003    ECHO, EF wnl, mild stenosis, A.S. mild MR, Mild T.R03/31/2003//ECHO EF 70%,LVH, ?diast dysfunction 09/11/2003  . Cataract extraction w/ intraocular lens   implant, bilateral 2003  . Dilation and curettage of uterus 09/07/2000    endometrial polyps removed, path all benign   . Tonsillectomy and adenoidectomy     "as a child"  . Fracture surgery 2010    right knee    History   Social History  . Marital Status: Married    Spouse Name: N/A    Number of Children: 5  . Years of Education: N/A   Occupational History  . retired    Social History Main Topics  . Smoking status: Former Smoker -- 0.5 packs/day for 4 years    Types: Cigarettes    Quit date: 07/06/1974  . Smokeless tobacco: Never Used  . Alcohol Use: No  . Drug Use: No  . Sexually Active: No   Other Topics Concern  . 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 Remaining systems are negative.  Physical Exam: Well-developed well-nourished in no acute distress.  Skin is warm and dry.  HEENT is normal.  Neck is supple.  Chest is clear to auscultation with normal expansion.  Cardiovascular exam is irregular, 2/6 systolic murmur left sternal border that increases with Valsalva. Abdominal exam nontender or distended. No masses palpated. Extremities show no edema. neuro grossly intact  Probable sinus rhythm with PACs. Septal infarct. Nonspecific ST changes.

## 2012-04-12 NOTE — Patient Instructions (Signed)
Your physician wants you to follow-up in: 6 MONTHS WITH DR CRENSHAW You will receive a reminder letter in the mail two months in advance. If you don't receive a letter, please call our office to schedule the follow-up appointment.  

## 2012-04-12 NOTE — Assessment & Plan Note (Signed)
Blood pressure mildly elevated. Continue present medications and increase if needed.

## 2012-04-12 NOTE — Assessment & Plan Note (Signed)
Electrocardiogram shows irregularity but this appears to be sinus with PACs. If she develops recurrent atrial fibrillation in the future we'll consider disopyramide or tikosyn. Not a Coumadin candidate given history of falls. Continue aspirin.

## 2012-04-12 NOTE — Assessment & Plan Note (Signed)
Continue calcium blocker. She is having increased dyspnea but there may be a contribution from her anemia. She is scheduled to have  iron transfusion.

## 2012-04-13 ENCOUNTER — Encounter (HOSPITAL_COMMUNITY)
Admission: RE | Admit: 2012-04-13 | Discharge: 2012-04-13 | Disposition: A | Discharge status: Home / Self Care | Payer: Medicare Other | Source: Ambulatory Visit | Attending: Gastroenterology | Admitting: Gastroenterology

## 2012-04-13 VITALS — BP 151/62 | HR 74 | Temp 98.0°F | Resp 19 | Ht 60.0 in | Wt 137.0 lb

## 2012-04-13 DIAGNOSIS — D649 Anemia, unspecified: Secondary | ICD-10-CM | POA: Insufficient documentation

## 2012-04-13 MED ORDER — SODIUM CHLORIDE 0.9 % IV SOLN
1000.0000 mg | Freq: Once | INTRAVENOUS | Status: AC
  Administered 2012-04-13: 1000 mg via INTRAVENOUS
  Filled 2012-04-13: qty 20

## 2012-04-13 MED ORDER — SODIUM CHLORIDE 0.9 % IV SOLN
15.0000 mg | Freq: Once | INTRAVENOUS | Status: DC
  Filled 2012-04-13: qty 0.3

## 2012-04-13 MED ORDER — SODIUM CHLORIDE 0.9 % IV SOLN
1000.0000 mg | Freq: Once | INTRAVENOUS | Status: DC
  Filled 2012-04-13: qty 20

## 2012-04-13 MED ORDER — SODIUM CHLORIDE 0.9 % IV SOLN
25.0000 mg | Freq: Once | INTRAVENOUS | Status: AC
  Administered 2012-04-13: 25 mg via INTRAVENOUS
  Filled 2012-04-13: qty 0.5

## 2012-04-13 MED ORDER — SODIUM CHLORIDE 0.9 % IV SOLN
INTRAVENOUS | Status: AC
  Administered 2012-04-13: 250 mL via INTRAVENOUS

## 2012-04-13 NOTE — Discharge Instructions (Signed)

## 2012-04-20 ENCOUNTER — Other Ambulatory Visit (INDEPENDENT_AMBULATORY_CARE_PROVIDER_SITE_OTHER): Payer: Medicare Other

## 2012-04-20 ENCOUNTER — Other Ambulatory Visit: Payer: Self-pay | Admitting: *Deleted

## 2012-04-20 ENCOUNTER — Other Ambulatory Visit: Payer: Medicare Other

## 2012-04-20 DIAGNOSIS — D649 Anemia, unspecified: Secondary | ICD-10-CM

## 2012-04-20 LAB — CBC WITH DIFFERENTIAL/PLATELET
Eosinophils Relative: 1 % (ref 0.0–5.0)
HCT: 31 % — ABNORMAL LOW (ref 36.0–46.0)
Hemoglobin: 9.4 g/dL — ABNORMAL LOW (ref 12.0–15.0)
Lymphs Abs: 1.7 10*3/uL (ref 0.7–4.0)
Monocytes Relative: 11.3 % (ref 3.0–12.0)
Neutro Abs: 3.4 10*3/uL (ref 1.4–7.7)
WBC: 6 10*3/uL (ref 4.5–10.5)

## 2012-04-20 LAB — IBC PANEL: Iron: 71 ug/dL (ref 42–145)

## 2012-04-21 ENCOUNTER — Other Ambulatory Visit: Payer: Self-pay | Admitting: Gastroenterology

## 2012-04-21 DIAGNOSIS — D649 Anemia, unspecified: Secondary | ICD-10-CM

## 2012-04-27 ENCOUNTER — Other Ambulatory Visit: Payer: Self-pay

## 2012-04-27 MED ORDER — LEVOTHYROXINE SODIUM 25 MCG PO TABS: 25.0000 ug | ORAL_TABLET | Freq: Every morning | ORAL | Status: DC

## 2012-04-27 NOTE — Telephone Encounter (Signed)
Pt request refill levothyroxine 25 mcg #90 x 1. Patient notified as instructed by telephone refill done.Marland Kitchen

## 2012-06-14 ENCOUNTER — Other Ambulatory Visit: Payer: Self-pay | Admitting: Cardiology

## 2012-06-27 ENCOUNTER — Encounter: Payer: Self-pay | Admitting: Family Medicine

## 2012-06-27 ENCOUNTER — Ambulatory Visit (INDEPENDENT_AMBULATORY_CARE_PROVIDER_SITE_OTHER): Payer: Medicare Other | Admitting: Family Medicine

## 2012-06-27 VITALS — BP 142/56 | HR 78 | Temp 98.1°F | Wt 135.0 lb

## 2012-06-27 DIAGNOSIS — R1011 Right upper quadrant pain: Secondary | ICD-10-CM

## 2012-06-27 DIAGNOSIS — M549 Dorsalgia, unspecified: Secondary | ICD-10-CM

## 2012-06-27 DIAGNOSIS — D509 Iron deficiency anemia, unspecified: Secondary | ICD-10-CM

## 2012-06-27 DIAGNOSIS — R109 Unspecified abdominal pain: Secondary | ICD-10-CM

## 2012-06-27 DIAGNOSIS — D649 Anemia, unspecified: Secondary | ICD-10-CM

## 2012-06-27 LAB — CBC WITH DIFFERENTIAL/PLATELET
Basophils Absolute: 0 10*3/uL (ref 0.0–0.1)
Eosinophils Absolute: 0.2 10*3/uL (ref 0.0–0.7)
Eosinophils Relative: 2.4 % (ref 0.0–5.0)
MCHC: 32.8 g/dL (ref 30.0–36.0)
MCV: 89.1 fl (ref 78.0–100.0)
Monocytes Absolute: 0.8 10*3/uL (ref 0.1–1.0)
Neutrophils Relative %: 63.9 % (ref 43.0–77.0)
Platelets: 250 10*3/uL (ref 150.0–400.0)
RDW: 17.4 % — ABNORMAL HIGH (ref 11.5–14.6)
WBC: 7 10*3/uL (ref 4.5–10.5)

## 2012-06-27 LAB — COMPREHENSIVE METABOLIC PANEL
Albumin: 3.6 g/dL (ref 3.5–5.2)
Alkaline Phosphatase: 61 U/L (ref 39–117)
Glucose, Bld: 95 mg/dL (ref 70–99)
Potassium: 4.2 mEq/L (ref 3.5–5.1)
Sodium: 137 mEq/L (ref 135–145)
Total Protein: 6.5 g/dL (ref 6.0–8.3)

## 2012-06-27 MED ORDER — HYDROCODONE-ACETAMINOPHEN 5-500 MG PO TABS: 0.5000 | ORAL_TABLET | Freq: Three times a day (TID) | ORAL | Status: DC | PRN

## 2012-06-27 NOTE — Assessment & Plan Note (Signed)
Recheck CBC today.  No blood in stool reported by patient.  See notes on labs.

## 2012-06-27 NOTE — Assessment & Plan Note (Signed)
Check cmet today and abd u/s.  She has known gallstones and this could explain her sx.  abd benign.  See notes on u/s report.  D/w pt.  If persisting, worse, then she'll notify us.  Nontoxic appearing.  Would avoid fatty meals in meantime. She agrees.

## 2012-06-27 NOTE — Patient Instructions (Addendum)
Go to the lab on the way out.  We'll contact you with your lab report. See Shirlee Limerick about your referral before you leave today. If the abdominal bloating or discomfort continues, then notify us.  Take care.

## 2012-06-27 NOTE — Assessment & Plan Note (Signed)
Continued, longstanding.  Taking 1/2 tab of vicodin prn, not daily.  No ADE, does will with that dose.  Continue as is.  Initial rx was misprinted, shredded and correct rx printed for patient.

## 2012-06-27 NOTE — Progress Notes (Signed)
Recheck for anemia.  She's been eating plenty of vegetables and had IV iron.  Eating some red meat.  Due for recheck of CBC.  No bleeding.  S/p GI eval with CBC prev ordered by GI.  Bloated feeling in stomach over last few months.  Weight stable over the last few months.  Occ discomfort in RUQ, worse rolling onto that side at night. No fevers, jaundice, vomiting, diarrhea, blood in stool. Known gallstones from CT abdomen 2011  Still taking taking hydrocodone episodically for her back pain with relief.  Takes 1/2 pill a day or less with relief.  No ADE.    PMH and SH reviewed  ROS: See HPI, otherwise noncontributory.  Meds, vitals, and allergies reviewed.   nad ncat Mmm Ectopy noted, murmur noted, not tachy ctab abd soft, not ttp in the RUQ at time of exam No rebound, normal BS Ext w/o edema

## 2012-07-03 ENCOUNTER — Ambulatory Visit
Admission: RE | Admit: 2012-07-03 | Discharge: 2012-07-03 | Disposition: A | Discharge status: Home / Self Care | Payer: Medicare Other | Source: Ambulatory Visit | Attending: Family Medicine | Admitting: Family Medicine

## 2012-07-03 DIAGNOSIS — R1011 Right upper quadrant pain: Secondary | ICD-10-CM

## 2012-08-29 ENCOUNTER — Emergency Department (HOSPITAL_COMMUNITY): Payer: Medicare Other

## 2012-08-29 ENCOUNTER — Encounter (HOSPITAL_COMMUNITY): Payer: Self-pay

## 2012-08-29 ENCOUNTER — Observation Stay (HOSPITAL_COMMUNITY)
Admission: EM | Admit: 2012-08-29 | Discharge: 2012-08-31 | Disposition: A | Discharge status: Home / Self Care | Payer: Medicare Other | Attending: Orthopedic Surgery | Admitting: Orthopedic Surgery

## 2012-08-29 DIAGNOSIS — I421 Obstructive hypertrophic cardiomyopathy: Secondary | ICD-10-CM | POA: Diagnosis present

## 2012-08-29 DIAGNOSIS — E042 Nontoxic multinodular goiter: Secondary | ICD-10-CM

## 2012-08-29 DIAGNOSIS — D509 Iron deficiency anemia, unspecified: Secondary | ICD-10-CM | POA: Diagnosis present

## 2012-08-29 DIAGNOSIS — I10 Essential (primary) hypertension: Secondary | ICD-10-CM | POA: Diagnosis present

## 2012-08-29 DIAGNOSIS — E039 Hypothyroidism, unspecified: Secondary | ICD-10-CM | POA: Diagnosis present

## 2012-08-29 DIAGNOSIS — R002 Palpitations: Secondary | ICD-10-CM | POA: Diagnosis present

## 2012-08-29 DIAGNOSIS — I4891 Unspecified atrial fibrillation: Principal | ICD-10-CM | POA: Insufficient documentation

## 2012-08-29 DIAGNOSIS — I422 Other hypertrophic cardiomyopathy: Secondary | ICD-10-CM | POA: Insufficient documentation

## 2012-08-29 DIAGNOSIS — R748 Abnormal levels of other serum enzymes: Secondary | ICD-10-CM | POA: Insufficient documentation

## 2012-08-29 DIAGNOSIS — R0789 Other chest pain: Secondary | ICD-10-CM | POA: Insufficient documentation

## 2012-08-29 LAB — TROPONIN I
Troponin I: <0.3 ng/mL (ref <0.30)
Troponin I: 0.34 ng/mL (ref <0.30)
Troponin I: 0.78 ng/mL (ref <0.30)
Troponin I: 0.74 ng/mL (ref <0.30)

## 2012-08-29 LAB — CBC
Hemoglobin: 13.7 g/dL (ref 12.0–15.0)
MCH: 30.6 pg (ref 26.0–34.0)
MCHC: 34.1 g/dL (ref 30.0–36.0)

## 2012-08-29 LAB — CK TOTAL AND CKMB (NOT AT ARMC): Relative Index: 6.5 — ABNORMAL HIGH (ref 0.0–2.5)

## 2012-08-29 LAB — COMPREHENSIVE METABOLIC PANEL
BUN: 21 mg/dL (ref 6–23)
Calcium: 9 mg/dL (ref 8.4–10.5)
GFR calc Af Amer: 86 mL/min — ABNORMAL LOW (ref >90)
Glucose, Bld: 97 mg/dL (ref 70–99)
Sodium: 135 mEq/L (ref 135–145)
Total Protein: 6.1 g/dL (ref 6.0–8.3)

## 2012-08-29 LAB — PROTIME-INR: INR: 0.96 (ref 0.00–1.49)

## 2012-08-29 LAB — APTT: aPTT: 32 seconds (ref 24–37)

## 2012-08-29 MED ORDER — DISOPYRAMIDE PHOSPHATE ER 100 MG PO CP12
200.0000 mg | ORAL_CAPSULE | Freq: Two times a day (BID) | ORAL | Status: DC
  Administered 2012-08-29 – 2012-08-31 (×4): 200 mg via ORAL
  Filled 2012-08-29 (×7): qty 2

## 2012-08-29 MED ORDER — SODIUM CHLORIDE 0.9 % IV BOLUS (SEPSIS)
250.0000 mL | Freq: Once | INTRAVENOUS | Status: AC
  Administered 2012-08-29: 250 mL via INTRAVENOUS

## 2012-08-29 MED ORDER — HYDROCHLOROTHIAZIDE 12.5 MG PO CAPS
12.5000 mg | ORAL_CAPSULE | Freq: Every day | ORAL | Status: DC
  Administered 2012-08-29: 12.5 mg via ORAL
  Filled 2012-08-29 (×3): qty 1

## 2012-08-29 MED ORDER — SODIUM CHLORIDE 0.9 % IJ SOLN
3.0000 mL | Freq: Two times a day (BID) | INTRAMUSCULAR | Status: DC
  Administered 2012-08-29 – 2012-08-30 (×3): 3 mL via INTRAVENOUS

## 2012-08-29 MED ORDER — ASPIRIN 325 MG PO TABS
325.0000 mg | ORAL_TABLET | Freq: Every day | ORAL | Status: DC
  Administered 2012-08-29 – 2012-08-31 (×3): 325 mg via ORAL
  Filled 2012-08-29 (×3): qty 1

## 2012-08-29 MED ORDER — ROPINIROLE HCL 0.25 MG PO TABS
0.2500 mg | ORAL_TABLET | Freq: Three times a day (TID) | ORAL | Status: DC | PRN
  Filled 2012-08-29: qty 1

## 2012-08-29 MED ORDER — DILTIAZEM HCL ER COATED BEADS 180 MG PO CP24
180.0000 mg | ORAL_CAPSULE | Freq: Every day | ORAL | Status: DC
  Administered 2012-08-29 – 2012-08-30 (×2): 180 mg via ORAL
  Filled 2012-08-29 (×3): qty 1

## 2012-08-29 MED ORDER — DILTIAZEM HCL 25 MG/5ML IV SOLN
10.0000 mg | Freq: Once | INTRAVENOUS | Status: DC
  Filled 2012-08-29: qty 5

## 2012-08-29 MED ORDER — SODIUM CHLORIDE 0.9 % IJ SOLN: 3.0000 mL | INTRAMUSCULAR | Status: DC | PRN

## 2012-08-29 MED ORDER — ONDANSETRON HCL 4 MG/2ML IJ SOLN
INTRAMUSCULAR | Status: AC
  Filled 2012-08-29: qty 2

## 2012-08-29 MED ORDER — SODIUM CHLORIDE 0.9 % IJ SOLN
3.0000 mL | Freq: Two times a day (BID) | INTRAMUSCULAR | Status: DC
  Administered 2012-08-29 – 2012-08-31 (×3): 3 mL via INTRAVENOUS

## 2012-08-29 MED ORDER — LEVOTHYROXINE SODIUM 25 MCG PO TABS
25.0000 ug | ORAL_TABLET | Freq: Every day | ORAL | Status: DC
  Administered 2012-08-29 – 2012-08-31 (×3): 25 ug via ORAL
  Filled 2012-08-29 (×5): qty 1

## 2012-08-29 MED ORDER — HYDROCHLOROTHIAZIDE 25 MG PO TABS: 12.5000 mg | ORAL_TABLET | Freq: Every day | ORAL | Status: DC

## 2012-08-29 MED ORDER — SODIUM CHLORIDE 0.9 % IV SOLN: 250.0000 mL | INTRAVENOUS | Status: DC | PRN

## 2012-08-29 MED ORDER — LOSARTAN POTASSIUM 50 MG PO TABS
50.0000 mg | ORAL_TABLET | Freq: Every day | ORAL | Status: DC
  Administered 2012-08-29 – 2012-08-31 (×3): 50 mg via ORAL
  Filled 2012-08-29 (×3): qty 1

## 2012-08-29 MED ORDER — DIPHENHYDRAMINE HCL 50 MG/ML IJ SOLN
12.5000 mg | Freq: Once | INTRAMUSCULAR | Status: AC
  Administered 2012-08-29: 12.5 mg via INTRAVENOUS
  Filled 2012-08-29: qty 1

## 2012-08-29 NOTE — Plan of Care (Signed)
Problem: Phase III Progression Outcomes Goal: Sinus rhythm established or heart rate < 100 at rest Outcome: Progressing In and out of afib and nsr

## 2012-08-29 NOTE — H&P (Signed)
PCP:   Crawford Givens, MD   Chief Complaint:  palpitations  HPI: 78 yo female c afib comes in with palp that resolved with ivf in ED.  Cards was called and wanted admission.  Pt did have some sob which is now resolved with rate control.  No fevers/cough/le edema. Has been suffering from chronic headaches recently which is now resolved.  She thinks this may be why her rate went up.  She does not like to take pain meds.  No cp.  Review of Systems:  O/w neg  Past Medical History: Past Medical History  Diagnosis Date  . Atrial fibrillation 04/23-24/2007    [facility] AFib, CT chest negative, CVTS/ P.E.  . Hypertrophic cardiomyopathy   . Urinary incontinence   . Diverticulosis of colon (without mention of hemorrhage) 2003/ 08/2000    EGD/colonoscopy Barretts esophagus//H.H divertics 08/2000  . Cervical mass     C2 lateral mass fracture  . Hypertension   . Hypercholesterolemia     219/497  . Hypothyroidism   . Multinodular goiter (nontoxic)   . Osteoporosis   . Blood transfusion   . Jaundice ~ 1935    "in grade school"  . Degenerative joint disease     back  . Depression     "husband died 01-26-2011"  . Anemia, iron deficiency   . Personal history of colonic polyps 02/29/2012    tubular adenoma  . Barrett's esophagus    Past Surgical History  Procedure Date  . Bladder surgery     bladder tack early 90's  . Tear duct probing 07/29/03    tear duck surg  . Cystourethroscopy 09/17/03  . Rotator cuff repair ? date; 09/07/05    left; right( Dr. Darrelyn Hillock)  . Appendectomy 1941  . Breast surgery 1981    breast reduction  . Eye surgery 03/2002    cataract OS  . Thyroid ultrasound 10/14/2003    MNG, no dominant masses  . Doppler echocardiography 03/05/2002&09/11/2003    ECHO, EF wnl, mild stenosis, A.S. mild MR, Mild T.R03/31/2003//ECHO EF 70%,LVH, ?diast dysfunction 09/11/2003  . Cataract extraction w/ intraocular lens  implant, bilateral 2003  . Dilation and curettage of uterus  09/07/2000    endometrial polyps removed, path all benign   . Tonsillectomy and adenoidectomy     "as a child"  . Fracture surgery 2010    right knee    Medications: Prior to Admission medications   Medication Sig Start Date End Date Taking? Authorizing Provider  aspirin 325 MG tablet Take 325 mg by mouth daily.     Yes Historical Provider, MD  diltiazem (CARDIZEM CD) 180 MG 24 hr capsule TAKE 1 CAPSULE DAILY 02/16/12  Yes Joaquim Nam, MD  fish oil-omega-3 fatty acids 1000 MG capsule Take 1 g by mouth 2 (two) times daily.     Yes Historical Provider, MD  hydrochlorothiazide (HYDRODIURIL) 25 MG tablet Take 0.5 tablets (12.5 mg total) by mouth daily. 02/04/12  Yes Joaquim Nam, MD  HYDROcodone-acetaminophen (VICODIN) 5-500 MG per tablet Take 0.5 tablets by mouth every 8 (eight) hours as needed. For pain 06/27/12  Yes Joaquim Nam, MD  levothyroxine (SYNTHROID, LEVOTHROID) 25 MCG tablet Take 1 tablet (25 mcg total) by mouth every morning. 04/27/12  Yes Joaquim Nam, MD  losartan (COZAAR) 50 MG tablet TAKE 1 TABLET DAILY 06/14/12  Yes Lewayne Bunting, MD  Multiple Vitamins-Minerals (OCUVITE PRESERVISION) TABS Take 1 tablet by mouth daily.     Yes Historical Provider,  MD  zolpidem (AMBIEN) 10 MG tablet Take 0.5 tablets (5 mg total) by mouth at bedtime as needed. For sleep 02/04/12  Yes Joaquim Nam, MD  ranitidine (ZANTAC) 150 MG capsule Take 150 mg by mouth daily as needed. For heartburn     Historical Provider, MD  rOPINIRole (REQUIP) 0.25 MG tablet Take 1 tablet (0.25 mg total) by mouth 3 (three) times daily as needed (for leg cramps ). 01/27/12 01/26/13  Sorin Luanne Bras, MD    Allergies:   Allergies  Allergen Reactions  . Irbesartan     REACTION: swelling  . Ramipril     REACTION: lips swelling  . Telmisartan-Hctz     REACTION: incontinence    Social History:  reports that she quit smoking about 38 years ago. Her smoking use included Cigarettes. She has a 2 pack-year smoking  history. She has never used smokeless tobacco. She reports that she does not drink alcohol or use illicit drugs.  Family History: Family History  Problem Relation Age of Onset  . Heart failure Mother     CHF, DM, HBP  . Hypertension Mother   . Uterine cancer Mother   . Stroke Mother   . Colon cancer Neg Hx   . Esophageal cancer Neg Hx   . Rectal cancer Neg Hx   . Stomach cancer Neg Hx     Physical Exam: Filed Vitals:   08/29/12 0130 08/29/12 0226  BP: 93/68 99/56  Pulse: 110 96  SpO2: 98% 97%   General appearance: alert, cooperative and no distress Neck: no JVD Lungs: clear to auscultation bilaterally Heart: irregularly irregular rhythm Abdomen: soft, non-tender; bowel sounds normal; no masses,  no organomegaly Extremities: extremities normal, atraumatic, no cyanosis or edema Pulses: 2+ and symmetric Skin: Skin color, texture, turgor normal. No rashes or lesions Neurologic: Grossly normal    Labs on Admission:   [facility] 08/29/12 0122  NA 135  K 3.7  CL 98  CO2 25  GLUCOSE 97  BUN 21  CREATININE 0.73  CALCIUM 9.0  MG --  PHOS --    Basename 08/29/12 0122  AST 18  ALT 11  ALKPHOS 60  BILITOT 0.3  PROT 6.1  ALBUMIN 3.2*    Basename 08/29/12 0122  WBC 6.1  NEUTROABS --  HGB 13.7  HCT 40.2  MCV 89.7  PLT 206    Basename 08/29/12 0122  CKTOTAL 128  CKMB 5.0*  CKMBINDEX --  TROPONINI <0.30   Radiological Exams on Admission: Dg Chest Port 1 View  08/29/2012  *RADIOLOGY REPORT*  Clinical Data: Palpitations.  Short of breath.  Elevated blood pressure.  PORTABLE CHEST - 1 VIEW  Comparison: 01/26/2012.  06/28/2010.  Findings: Cardiomegaly.  Moderate hiatal hernia.  No airspace disease.  No effusion. Monitoring leads are projected over the chest.  Tortuous thoracic aorta. Calcified granuloma at the left costophrenic angle.  IMPRESSION: Cardiomegaly without failure.  No acute cardiopulmonary disease. Hiatal hernia.   Original Report Authenticated By:  Andreas Newport, M.D.     Assessment/Plan Present on Admission:  78 yo female with afib w rvr now resolved with rate control .Atrial fibrillation with RVR .HYPERTENSION .Hypertrophic obstructive cardiomyopathy .ANEMIA, IRON DEFICIENCY .HYPOTHYROIDISM .Palpitations  Cards called and will see later.  Advised not to change meds at this time.  Obs.  Serial enzymes.    Terrisa Curfman A [phone] 08/29/2012, 3:23 AM

## 2012-08-29 NOTE — ED Notes (Signed)
Pt presents to ED with palpitations. Pt describes the symptoms as fluttering. Pt states was sob but denies chest pain. EMS states pt became nausea enroute. Pt was given 4mg  zofran

## 2012-08-29 NOTE — ED Notes (Signed)
Pt given some water, per MD. Pt states HA is better.

## 2012-08-29 NOTE — Progress Notes (Signed)
[facility] PROGRESS NOTE  Cindy [name] VQQ:[id number] DOB: [birth year] DOA: 08/29/2012 PCP: Crawford Givens, MD  Assessment/Plan: Atrial fibrillation with RVR -Back in sinus -As per previous note, cardiology is to see the patient -Continue diltiazem -Echocardiogram -TSH, continue Synthroid current home dose -On aspirin 325 mg daily Elevated troponin -Suspect demand ischemia from tachycardia -Await cardiology opinion -Continue aspirin 325 mg -Check morning lipids -Hemodynamically stable, chest pain free Hypothyroidism -TSH as above -Continue Synthroid Hypertension -Controlled; continue diltiazem, losartan Iron deficiency anemia -Unable to tolerate oral iron -Patient received intermittent IV iron -Check iron studies     Family Communication:   Son at bedside Disposition Plan:   Home when medically stable      Procedures/Studies: Dg Chest Port 1 View  08/29/2012  *RADIOLOGY REPORT*  Clinical Data: Palpitations.  Short of breath.  Elevated blood pressure.  PORTABLE CHEST - 1 VIEW  Comparison: 01/26/2012.  06/28/2010.  Findings: Cardiomegaly.  Moderate hiatal hernia.  No airspace disease.  No effusion. Monitoring leads are projected over the chest.  Tortuous thoracic aorta. Calcified granuloma at the left costophrenic angle.  IMPRESSION: Cardiomegaly without failure.  No acute cardiopulmonary disease. Hiatal hernia.   Original Report Authenticated By: Andreas Newport, M.D.          Subjective: Patient is in good spirits. She denies any more palpitations. She denies any dizziness, chest pain, shortness of breath, nausea, vomiting, diarrhea, abdominal pain. Complains of occipital headache which is improved since hospitalization.  Objective: Filed Vitals:   08/29/12 0226 08/29/12 0352 08/29/12 0450 08/29/12 1434  BP: 99/56 107/73 130/89 119/72  Pulse: 96  65 67  Temp:   97.4 F (36.3 C) 97.6 F (36.4 C)  TempSrc:   Oral   Resp:  18 18 18   Height:   5'  (1.524 m)   Weight:   58.968 kg (130 lb)   SpO2: 97% 97% 96% 97%    Intake/Output Summary (Last 24 hours) at 08/29/12 1623 Last data filed at 08/29/12 1300  Gross per 24 hour  Intake    240 ml  Output    150 ml  Net     90 ml   Weight change:  Exam:   General:  Pt is alert, follows commands appropriately, not in acute distress  HEENT: No icterus, No thrush, [place]/AT  Cardiovascular: RRR, S1/S2, no rub  Respiratory: Clear to auscultation bilaterally, no wheezing, no crackles, no rhonchi  Abdomen: Soft/+BS, non tender, non distended, no guarding  Extremities: No edema, No lymphangitis, No petechiae, No rashes, no synovitis  Data Reviewed: Basic Metabolic Panel:  Lab 08/29/12 4332  NA 135  K 3.7  CL 98  CO2 25  GLUCOSE 97  BUN 21  CREATININE 0.73  CALCIUM 9.0  MG --  PHOS --   Liver Function Tests:  Lab 08/29/12 0122  AST 18  ALT 11  ALKPHOS 60  BILITOT 0.3  PROT 6.1  ALBUMIN 3.2*   No results found for this basename: LIPASE:5,AMYLASE:5 in the last 168 hours No results found for this basename: AMMONIA:5 in the last 168 hours CBC:  Lab 08/29/12 0122  WBC 6.1  NEUTROABS --  HGB 13.7  HCT 40.2  MCV 89.7  PLT 206   Cardiac Enzymes:  Lab 08/29/12 1045 08/29/12 0530 08/29/12 0122  CKTOTAL -- -- 128  CKMB -- -- 5.0*  CKMBINDEX -- -- --  TROPONINI 0.78* 0.34* <0.30   BNP: No components found with this basename: POCBNP:5 CBG: No results found for  this basename: GLUCAP:5 in the last 168 hours  No results found for this or any previous visit (from the past 240 hour(s)).   Scheduled Meds:   . aspirin  325 mg Oral Daily  . diltiazem  180 mg Oral Daily  . diphenhydrAMINE  12.5 mg Intravenous Once  . hydrochlorothiazide  12.5 mg Oral Daily  . levothyroxine  25 mcg Oral QAC breakfast  . losartan  50 mg Oral Daily  . ondansetron      . sodium chloride  250 mL Intravenous Once  . sodium chloride  3 mL Intravenous Q12H  . sodium chloride  3 mL  Intravenous Q12H  . DISCONTD: diltiazem  10 mg Intravenous Once  . DISCONTD: hydrochlorothiazide  12.5 mg Oral Daily   Continuous Infusions:    Lulabelle Desta, DO  [facility] Pager [phone]  If 7PM-7AM, please contact night-coverage [email or web address] Password [email or web address] 08/29/2012, 4:23 PM   LOS: 0 days

## 2012-08-29 NOTE — ED Notes (Signed)
Called to give report, Diplomatic Services operational officer took my number, states will have nurse to call me back.

## 2012-08-29 NOTE — Progress Notes (Signed)
Utilization review complete 

## 2012-08-29 NOTE — Consult Note (Signed)
Cardiology Consult Note   Patient ID: Cindy [name] MRN: [id number], DOB/AGE: [birth year]   Admit date: 08/29/2012 Date of Consult: 08/29/2012  Primary Physician: Crawford Givens, MD Primary Cardiologist: Olga Millers, MD  Reason for consult: evaluation/management of a-fib + RVR  HPI: Cindy [name] is a 78yo female with PMHx significant for PAF (poor Coumadin candidate 2/2 falls), HOCM, HTN, hypothyroidism and history of anemia with work-up revealing Barrett's esophagus (s/p polypectomy, iron infusion) who was admitted to [facility] on 08/29/12 for atrial fibrillation + RVR.  Last Myoview 10/2006- EF 70%, normal perfusion and mild soft tissue attenuation Last echo 08/2011- LVEF 60-65%, moderate LVH, severe focal septal basal hypertrophy, dynamic outflow tract obstruction peak velocity of 4.5cm/sec and a peak gradient of 82mm Hg. Wall motion was normal; there were no regional wall motion abnormalities. Doppler parameters are consistent with high ventricular filling pressure.There was mild aortic stenosis. Trivial regurgitation. Mild MR. The left atrium was moderately dilated. Pulmonary Systolic pressure was mildly increased.  She last followed up in 04/2012 with Dr. Jens Som. At that time, she reported some DOE, but denied PND, orthopnea, edema, chest pain or syncope. The former was suspected to be secondary to her anemia and iron transfusion had already been scheduled. Her ECG did show some irregularity noted to be sinus with PACs. A note was made that if she developed recurrent a-fib, Tikosyn or disopyramide would be considered. She was continued on ASA and diltiazem.  She reports being in her USOH when around 7:00 PM last night she experienced shortness of breath and weakness. She denies chest pain, PND, orthopnea, LE edema, palpitations, syncope or DOE leading up to this. She reports medication compliance. She denies increased caffeine or EtOH use. She denies recent illness, n/v/d,  active bleeding, unilateral leg swelling, redness or tenderness. She presented to [facility] ED.   There, EKG revealed a-fib with RVR and lateral ST depression. CXR revealed cardiomegaly without failure, and otherwise no active cardiopulmonary process. Initial trop-I returned WNL. She was started on Cardizem IV with rate-control and rapid conversion to NSR. She was admitted by the medicine service to telemetry. Two subsequent trop-I returned elevated at 0.34 and 0.78.  CBC unremarkable. BMET unremarkable aside from a mild hypoalbuminemia. She has maintained NSR and symptoms are much improved. VSS.  Problem List: Past Medical History  Diagnosis Date  . Atrial fibrillation 04/23-24/2007    [facility] AFib, CT chest negative, CVTS/ P.E.  . Hypertrophic cardiomyopathy   . Urinary incontinence   . Diverticulosis of colon (without mention of hemorrhage) 2003/ 08/2000    EGD/colonoscopy Barretts esophagus//H.H divertics 08/2000  . Cervical mass     C2 lateral mass fracture  . Hypertension   . Hypercholesterolemia     219/497  . Hypothyroidism   . Multinodular goiter (nontoxic)   . Osteoporosis   . Blood transfusion   . Jaundice ~ 1935    "in grade school"  . Degenerative joint disease     back  . Depression     "husband died 2011/02/18"  . Anemia, iron deficiency   . Personal history of colonic polyps 02/29/2012    tubular adenoma  . Barrett's esophagus   . Shortness of breath     Past Surgical History  Procedure Date  . Bladder surgery     bladder tack early 90's  . Tear duct probing 07/29/03    tear duck surg  . Cystourethroscopy 09/17/03  . Rotator cuff repair ? date; 09/07/05  left; right( Dr. Darrelyn Hillock)  . Appendectomy 1941  . Breast surgery 1981    breast reduction  . Eye surgery 03/2002    cataract OS  . Thyroid ultrasound 10/14/2003    MNG, no dominant masses  . Doppler echocardiography 03/05/2002&09/11/2003    ECHO, EF wnl, mild stenosis, A.S. mild MR, Mild  T.R03/31/2003//ECHO EF 70%,LVH, ?diast dysfunction 09/11/2003  . Cataract extraction w/ intraocular lens  implant, bilateral 2003  . Dilation and curettage of uterus 09/07/2000    endometrial polyps removed, path all benign   . Tonsillectomy and adenoidectomy     "as a child"  . Fracture surgery 2010    right knee     Allergies:  Allergies  Allergen Reactions  . Irbesartan     REACTION: swelling  . Ramipril     REACTION: lips swelling  . Telmisartan-Hctz     REACTION: incontinence  . Morphine And Related Other (See Comments)    "drives me crazy"    Home Medications: Prior to Admission medications   Medication Sig Start Date End Date Taking? Authorizing Provider  aspirin 325 MG tablet Take 325 mg by mouth daily.     Yes Historical Provider, MD  diltiazem (CARDIZEM CD) 180 MG 24 hr capsule TAKE 1 CAPSULE DAILY 02/16/12  Yes Joaquim Nam, MD  fish oil-omega-3 fatty acids 1000 MG capsule Take 1 g by mouth 2 (two) times daily.     Yes Historical Provider, MD  hydrochlorothiazide (HYDRODIURIL) 25 MG tablet Take 0.5 tablets (12.5 mg total) by mouth daily. 02/04/12  Yes Joaquim Nam, MD  HYDROcodone-acetaminophen (VICODIN) 5-500 MG per tablet Take 0.5 tablets by mouth every 8 (eight) hours as needed. For pain 06/27/12  Yes Joaquim Nam, MD  levothyroxine (SYNTHROID, LEVOTHROID) 25 MCG tablet Take 1 tablet (25 mcg total) by mouth every morning. 04/27/12  Yes Joaquim Nam, MD  losartan (COZAAR) 50 MG tablet TAKE 1 TABLET DAILY 06/14/12  Yes Lewayne Bunting, MD  Multiple Vitamins-Minerals (OCUVITE PRESERVISION) TABS Take 1 tablet by mouth daily.     Yes Historical Provider, MD  zolpidem (AMBIEN) 10 MG tablet Take 0.5 tablets (5 mg total) by mouth at bedtime as needed. For sleep 02/04/12  Yes Joaquim Nam, MD  ranitidine (ZANTAC) 150 MG capsule Take 150 mg by mouth daily as needed. For heartburn     Historical Provider, MD  rOPINIRole (REQUIP) 0.25 MG tablet Take 1 tablet (0.25 mg  total) by mouth 3 (three) times daily as needed (for leg cramps ). 01/27/12 01/26/13  Sorin Luanne Bras, MD    Inpatient Medications:     . aspirin  325 mg Oral Daily  . diltiazem  180 mg Oral Daily  . diphenhydrAMINE  12.5 mg Intravenous Once  . hydrochlorothiazide  12.5 mg Oral Daily  . levothyroxine  25 mcg Oral QAC breakfast  . losartan  50 mg Oral Daily  . ondansetron      . sodium chloride  250 mL Intravenous Once  . sodium chloride  3 mL Intravenous Q12H  . sodium chloride  3 mL Intravenous Q12H  . DISCONTD: diltiazem  10 mg Intravenous Once  . DISCONTD: hydrochlorothiazide  12.5 mg Oral Daily   Prescriptions prior to admission  Medication Sig Dispense Refill  . aspirin 325 MG tablet Take 325 mg by mouth daily.        Marland Kitchen diltiazem (CARDIZEM CD) 180 MG 24 hr capsule TAKE 1 CAPSULE DAILY  90 capsule  1  .  fish oil-omega-3 fatty acids 1000 MG capsule Take 1 g by mouth 2 (two) times daily.        . hydrochlorothiazide (HYDRODIURIL) 25 MG tablet Take 0.5 tablets (12.5 mg total) by mouth daily.  90 tablet  3  . HYDROcodone-acetaminophen (VICODIN) 5-500 MG per tablet Take 0.5 tablets by mouth every 8 (eight) hours as needed. For pain  90 tablet  1  . levothyroxine (SYNTHROID, LEVOTHROID) 25 MCG tablet Take 1 tablet (25 mcg total) by mouth every morning.  90 tablet  1  . losartan (COZAAR) 50 MG tablet TAKE 1 TABLET DAILY  90 tablet  3  . Multiple Vitamins-Minerals (OCUVITE PRESERVISION) TABS Take 1 tablet by mouth daily.        Marland Kitchen zolpidem (AMBIEN) 10 MG tablet Take 0.5 tablets (5 mg total) by mouth at bedtime as needed. For sleep  90 tablet  1  . ranitidine (ZANTAC) 150 MG capsule Take 150 mg by mouth daily as needed. For heartburn       . rOPINIRole (REQUIP) 0.25 MG tablet Take 1 tablet (0.25 mg total) by mouth 3 (three) times daily as needed (for leg cramps ).  30 tablet  0    Family History  Problem Relation Age of Onset  . Heart failure Mother     CHF, DM, HBP  . Hypertension Mother    . Uterine cancer Mother   . Stroke Mother   . Colon cancer Neg Hx   . Esophageal cancer Neg Hx   . Rectal cancer Neg Hx   . Stomach cancer Neg Hx      History   Social History  . Marital Status: Married    Spouse Name: N/A    Number of Children: 5  . Years of Education: N/A   Occupational History  . retired    Social History Main Topics  . Smoking status: Former Smoker -- 0.5 packs/day for 4 years    Types: Cigarettes    Quit date: 07/06/1974  . Smokeless tobacco: Never Used  . Alcohol Use: No  . Drug Use: No  . Sexually Active: No   Other Topics Concern  . 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  Cardiovascular: positive for shortness of breath, negative for chest pain, dyspnea on exertion, edema, orthopnea, palpitations, paroxysmal nocturnal dyspnea  Dermatological: negative for rash Respiratory: positive for chronic productive cough, negative for wheezing Urologic: negative for hematuria Abdominal: negative for nausea, vomiting, diarrhea, bright red blood per rectum, melena, or hematemesis Neurologic: negative for visual changes, syncope, or dizziness All other systems reviewed and are otherwise negative except as noted above.  Physical Exam: Blood pressure 119/72, pulse 67, temperature 97.6 F (36.4 C), temperature source Oral, resp. rate 18, height 5' (1.524 m), weight 58.968 kg (130 lb), SpO2 97.00%.    General: Elderly, well developed, in no acute distress. Head: Normocephalic, atraumatic, sclera non-icteric, no xanthomas, nares are without discharge.  Neck: Negative for carotid bruits. JVD not elevated. Lungs: Clear bilaterally to auscultation without wheezes, rales, or rhonchi. Breathing is  unlabored. Heart: RRR with S1 S2. 3/6 HSM along there sternal border.  Abdomen: Soft, non-tender, non-distended with normoactive bowel sounds. No hepatomegaly. No rebound/guarding. No obvious abdominal masses. Msk:  Strength and tone appears normal for age. Extremities:  No clubbing, cyanosis or edema.   Neuro: Alert and oriented X 3. Moves all extremities spontaneously. Psych:  Responds to questions appropriately with a normal affect.  Labs: Recent Labs  [facility] 08/29/12 0122   WBC 6.1   HGB 13.7   HCT 40.2   MCV 89.7   PLT 206   Lab 08/29/12 0122  NA 135  K 3.7  CL 98  CO2 25  BUN 21  CREATININE 0.73  CALCIUM 9.0  PROT 6.1  BILITOT 0.3  ALKPHOS 60  ALT 11  AST 18  AMYLASE --  LIPASE --  GLUCOSE 97   Recent Labs  Basename 08/29/12 1045 08/29/12 0530 08/29/12 0122   CKTOTAL -- -- 128   CKMB -- -- 5.0*   CKMBINDEX -- -- --   TROPONINI 0.78* 0.34* <0.30   Radiology/Studies: Dg Chest Port 1 View  08/29/2012  *RADIOLOGY REPORT*  Clinical Data: Palpitations.  Short of breath.  Elevated blood pressure.  PORTABLE CHEST - 1 VIEW  Comparison: 01/26/2012.  06/28/2010.  Findings: Cardiomegaly.  Moderate hiatal hernia.  No airspace disease.  No effusion. Monitoring leads are projected over the chest.  Tortuous thoracic aorta. Calcified granuloma at the left costophrenic angle.  IMPRESSION: Cardiomegaly without failure.  No acute cardiopulmonary disease. Hiatal hernia.   Original Report Authenticated By: Andreas Newport, M.D.     EKG: Atrial fibrillation with RVR, anterolateral slight ST depression.   ASSESSMENT:   1. Atrial fibrillation + RVR 2. NSTEMI 3. HOCM 4. HTN 5. History of iron def anemia  DISCUSSION/PLAN:  See comprehensive thoughts below.   Signed, R. Hurman Horn, PA-C 08/29/2012, 5:14 PM  Patient seen with PA, agree with the above note.  Patient has hypertrophic cardiomyopathy with signifcant LVOT gradient.  She was admitted with dyspnea in the setting  of atrial fibrillation with RVR.  She is now back in NSR after starting diltiazem gtt.  1. Atrial fibrillation: Admitted with atrial fibrillation with RVR.  She will tolerate this poorly with hypertrophic obstructive cardiomyopathy.  She went back into NSR today on diltiazem gtt and is now back on po diltiazem.  I would like to keep her out of atrial fibrillation.  She has not tolerated amiodarone in the past.  Disopyramide is probably the best option here given HCM history.  I will start her on disopyramide ER 200 mg bid.  She should be monitored in the hospital for initiation of this medication.  Will follow QT interval: will get ECG now and in the am.  QTc on ECG while in atrial fibrillation was reported as prolonged but visually does not look prolonged.  ECGs in the past in NSR have not had prolonged QTc.  She is not a coumadin candidate given fall risk.  Repeat echo is pending.  2. Hypertrophic cardiomyopathy: Significant LVOT gradient.  Her murmur is consistent with this diagnosis.  Disopyramide is a negative inotrope and can be helpful with HCM.  3. CAD: Elevated troponin (mild) in setting of atrial fibrillation with RVR and HCM.  No chest pain.  Suspect demand ischemia.  Would continue ASA and cycle troponin to peak.  Unless cardiac enzymes become markedly elevated, would defer further ischemic evaluation.   Marca Ancona 08/29/2012 6:08 PM

## 2012-08-29 NOTE — ED Provider Notes (Signed)
History     CSN: [id number]  Arrival date & time 08/29/12  0035   First MD Initiated Contact with Patient 08/29/12 0038      Chief Complaint  Patient presents with  . Palpitations    (Consider location/radiation/quality/duration/timing/severity/associated sxs/prior treatment) Patient is a 78 y.o. female presenting with palpitations. The history is provided by the patient (Pt with a history of chronic atrail fib.  seen by [facility].  Now with  afib in the 140's per ems).  Palpitations  This is a chronic problem. The current episode started 3 to 5 hours ago. The problem occurs constantly. The problem has not changed since onset.The problem is associated with an unknown factor. Associated symptoms include numbness and chest pressure. Pertinent negatives include no chest pain. She has tried nothing for the symptoms.    Past Medical History  Diagnosis Date  . Atrial fibrillation 04/23-24/2007    [facility] AFib, CT chest negative, CVTS/ P.E.  . Hypertrophic cardiomyopathy   . Urinary incontinence   . Diverticulosis of colon (without mention of hemorrhage) 2003/ 08/2000    EGD/colonoscopy Barretts esophagus//H.H divertics 08/2000  . Cervical mass     C2 lateral mass fracture  . Hypertension   . Hypercholesterolemia     219/497  . Hypothyroidism   . Multinodular goiter (nontoxic)   . Osteoporosis   . Blood transfusion   . Jaundice ~ 1935    "in grade school"  . Degenerative joint disease     back  . Depression     "husband died 02-02-11"  . Anemia, iron deficiency   . Personal history of colonic polyps 02/29/2012    tubular adenoma  . Barrett's esophagus     Past Surgical History  Procedure Date  . Bladder surgery     bladder tack early 90's  . Tear duct probing 07/29/03    tear duck surg  . Cystourethroscopy 09/17/03  . Rotator cuff repair ? date; 09/07/05    left; right( Dr. Darrelyn Hillock)  . Appendectomy 1941  . Breast surgery 1981    breast reduction  . Eye surgery 03/2002    cataract OS  . Thyroid ultrasound 10/14/2003    MNG, no dominant masses  . Doppler echocardiography 03/05/2002&09/11/2003    ECHO, EF wnl, mild stenosis, A.S. mild MR, Mild T.R03/31/2003//ECHO EF 70%,LVH, ?diast dysfunction 09/11/2003  . Cataract extraction w/ intraocular lens  implant, bilateral 2003  . Dilation and curettage of uterus 09/07/2000    endometrial polyps removed, path all benign   . Tonsillectomy and adenoidectomy     "as a child"  . Fracture surgery 2010    right knee    Family History  Problem Relation Age of Onset  . Heart failure Mother     CHF, DM, HBP  . Hypertension Mother   . Uterine cancer Mother   . Stroke Mother   . Colon cancer Neg Hx   . Esophageal cancer Neg Hx   . Rectal cancer Neg Hx   . Stomach cancer Neg Hx     History  Substance Use Topics  . Smoking status: Former Smoker -- 0.5 packs/day for 4 years    Types: Cigarettes    Quit date: 07/06/1974  . Smokeless tobacco: Never Used  . Alcohol Use: No    OB History    Grav Para Term Preterm Abortions TAB SAB Ect Mult Living                  Review of Systems  Cardiovascular: Positive for palpitations. Negative for chest pain.  Neurological: Positive for numbness.  All other systems reviewed and are negative.    Allergies  Irbesartan; Ramipril; and Telmisartan-hctz  Home Medications   Current Outpatient Rx  Name Route Sig Dispense Refill  . ASPIRIN 325 MG PO TABS Oral Take 325 mg by mouth daily.      Marland Kitchen DILTIAZEM HCL ER COATED BEADS 180 MG PO CP24  TAKE 1 CAPSULE DAILY 90 capsule 1  . OMEGA-3 FATTY ACIDS 1000 MG PO CAPS Oral Take 1 g by mouth 2 (two) times daily.      Marland Kitchen HYDROCHLOROTHIAZIDE 25 MG PO TABS Oral Take 0.5 tablets (12.5 mg total) by mouth daily. 90 tablet 3  . HYDROCODONE-ACETAMINOPHEN 5-500 MG PO TABS Oral Take 0.5 tablets by mouth every 8 (eight) hours as needed. For pain 90 tablet 1  . LEVOTHYROXINE SODIUM 25 MCG PO TABS Oral Take 1 tablet (25 mcg total) by mouth  every morning. 90 tablet 1  . LOSARTAN POTASSIUM 50 MG PO TABS  TAKE 1 TABLET DAILY 90 tablet 3  . OCUVITE PRESERVISION PO TABS Oral Take 1 tablet by mouth daily.      Marland Kitchen ZOLPIDEM TARTRATE 10 MG PO TABS Oral Take 0.5 tablets (5 mg total) by mouth at bedtime as needed. For sleep 90 tablet 1  . RANITIDINE HCL 150 MG PO CAPS Oral Take 150 mg by mouth daily as needed. For heartburn     . ROPINIROLE HCL 0.25 MG PO TABS Oral Take 1 tablet (0.25 mg total) by mouth 3 (three) times daily as needed (for leg cramps ). 30 tablet 0    There were no vitals taken for this visit.  Physical Exam  Constitutional: She is oriented to person, place, and time. She appears well-developed and well-nourished.  HENT:  Head: Normocephalic and atraumatic.  Eyes: Conjunctivae normal and EOM are normal. Pupils are equal, round, and reactive to light.  Neck: Normal range of motion.  Cardiovascular: Normal heart sounds.  An irregularly irregular rhythm present. Tachycardia present.   Pulmonary/Chest: Effort normal and breath sounds normal.  Abdominal: Soft. Bowel sounds are normal.  Musculoskeletal: Normal range of motion.  Neurological: She is alert and oriented to person, place, and time.  Skin: Skin is warm and dry.  Psychiatric: She has a normal mood and affect. Her behavior is normal.    ED Course  Procedures (including critical care time)   Labs Reviewed  CBC  COMPREHENSIVE METABOLIC PANEL  TROPONIN I  PROTIME-INR  APTT  CK TOTAL AND CKMB  DIGOXIN LEVEL   No results found.   No diagnosis found.   Date: 08/29/2012  Rate: 108  Rhythm: atrial fibrillation  QRS Axis: left  Intervals: no pr  ST/T Wave abnormalities: ST depressions laterally  Conduction Disutrbances:nonspecific intraventricular conduction delay  Narrative Interpretation:   Old EKG Reviewed: changes noted   MDM  + afib with rvr.   Will Cardizem, Cardiac enzymes,  Reassess         Donelle Baba Lytle Michaels, MD 08/29/12 0126

## 2012-08-30 DIAGNOSIS — E042 Nontoxic multinodular goiter: Secondary | ICD-10-CM

## 2012-08-30 DIAGNOSIS — I059 Rheumatic mitral valve disease, unspecified: Secondary | ICD-10-CM

## 2012-08-30 DIAGNOSIS — E039 Hypothyroidism, unspecified: Secondary | ICD-10-CM

## 2012-08-30 DIAGNOSIS — D509 Iron deficiency anemia, unspecified: Secondary | ICD-10-CM

## 2012-08-30 LAB — TSH: TSH: 1.293 u[IU]/mL (ref 0.350–4.500)

## 2012-08-30 LAB — LIPID PANEL
Cholesterol: 160 mg/dL (ref 0–200)
HDL: 62 mg/dL (ref >39)
Total CHOL/HDL Ratio: 2.6 RATIO

## 2012-08-30 LAB — BASIC METABOLIC PANEL
BUN: 21 mg/dL (ref 6–23)
Calcium: 8.5 mg/dL (ref 8.4–10.5)
Creatinine, Ser: 0.78 mg/dL (ref 0.50–1.10)
GFR calc Af Amer: 84 mL/min — ABNORMAL LOW (ref >90)

## 2012-08-30 LAB — IRON AND TIBC
Iron: 89 ug/dL (ref 42–135)
UIBC: 211 ug/dL (ref 125–400)

## 2012-08-30 LAB — FERRITIN: Ferritin: 28 ng/mL (ref 10–291)

## 2012-08-30 LAB — MAGNESIUM: Magnesium: 2.2 mg/dL (ref 1.5–2.5)

## 2012-08-30 MED ORDER — ONDANSETRON HCL 4 MG/2ML IJ SOLN
4.0000 mg | Freq: Four times a day (QID) | INTRAMUSCULAR | Status: DC | PRN
  Administered 2012-08-30: 4 mg via INTRAVENOUS
  Filled 2012-08-30: qty 2

## 2012-08-30 MED ORDER — ACETAMINOPHEN 325 MG PO TABS
650.0000 mg | ORAL_TABLET | Freq: Four times a day (QID) | ORAL | Status: DC | PRN
  Administered 2012-08-30 (×3): 650 mg via ORAL
  Filled 2012-08-30 (×4): qty 2

## 2012-08-30 NOTE — Progress Notes (Signed)
  Echocardiogram 2D Echocardiogram has been performed.  Amaal Dimartino FRANCES 08/30/2012, 2:54 PM

## 2012-08-30 NOTE — Progress Notes (Signed)
[facility] PROGRESS NOTE  JUDETH [name] ZHY:[id number] DOB: May 23, 1922 DOA: 08/29/2012 PCP: Crawford Givens, MD  Assessment/Plan: Atrial fibrillation with RVR -Back in sinus -cardiology consult appreciated.  -Continue diltiazem -Echocardiogram -TSH, continue Synthroid current home dose -On aspirin 325 mg daily Elevated troponin -Suspect demand ischemia from tachycardia -Await cardiology opinion -Continue aspirin 325 mg -Check morning lipids -Hemodynamically stable, chest pain free Hypothyroidism -TSH as above -Continue Synthroid Hypertension -Controlled; continue diltiazem, losartan Iron deficiency anemia -Unable to tolerate oral iron -Patient received intermittent IV iron -Check iron studies     Family Communication:   None at bedside. Disposition Plan:   Home when medically stable      Procedures/Studies: Dg Chest Port 1 View  08/29/2012  *RADIOLOGY REPORT*  Clinical Data: Palpitations.  Short of breath.  Elevated blood pressure.  PORTABLE CHEST - 1 VIEW  Comparison: 01/26/2012.  06/28/2010.  Findings: Cardiomegaly.  Moderate hiatal hernia.  No airspace disease.  No effusion. Monitoring leads are projected over the chest.  Tortuous thoracic aorta. Calcified granuloma at the left costophrenic angle.  IMPRESSION: Cardiomegaly without failure.  No acute cardiopulmonary disease. Hiatal hernia.   Original Report Authenticated By: Andreas Newport, M.D.          Subjective: Patient is in good spirits. She denies any more palpitations. Her nausea has improved after taking the medication with food.   Objective: Filed Vitals:   08/29/12 2030 08/30/12 0500 08/30/12 1316 08/30/12 1826  BP: 133/73 112/54 163/100 142/86  Pulse: 63 53 6   Temp: 97.4 F (36.3 C) 97.5 F (36.4 C) 97.3 F (36.3 C)   TempSrc: Oral  Oral   Resp: 16 18 19    Height:      Weight:      SpO2: 94% 96% 98%     Intake/Output Summary (Last 24 hours) at 08/30/12 1938 Last data filed at  08/30/12 1630  Gross per 24 hour  Intake    960 ml  Output   1550 ml  Net   -590 ml   Weight change:  Exam:   General:  Pt is alert, follows commands appropriately, not in acute distress  HEENT: No icterus, No thrush, [place]/AT  Cardiovascular: RRR, S1/S2, no rub  Respiratory: Clear to auscultation bilaterally, no wheezing, no crackles, no rhonchi  Abdomen: Soft/+BS, non tender, non distended, no guarding  Extremities: No edema, No lymphangitis, No petechiae, No rashes, no synovitis  Data Reviewed: Basic Metabolic Panel:  Lab 08/30/12 2952 08/29/12 0122  NA 133* 135  K 3.8 3.7  CL 99 98  CO2 25 25  GLUCOSE 83 97  BUN 21 21  CREATININE 0.78 0.73  CALCIUM 8.5 9.0  MG 2.2 --  PHOS -- --   Liver Function Tests:  Lab 08/29/12 0122  AST 18  ALT 11  ALKPHOS 60  BILITOT 0.3  PROT 6.1  ALBUMIN 3.2*   No results found for this basename: LIPASE:5,AMYLASE:5 in the last 168 hours No results found for this basename: AMMONIA:5 in the last 168 hours CBC:  Lab 08/29/12 0122  WBC 6.1  NEUTROABS --  HGB 13.7  HCT 40.2  MCV 89.7  PLT 206   Cardiac Enzymes:  Lab 08/29/12 1735 08/29/12 1045 08/29/12 0530 08/29/12 0122  CKTOTAL 134 -- -- 128  CKMB 8.7* -- -- 5.0*  CKMBINDEX -- -- -- --  TROPONINI 0.74* 0.78* 0.34* <0.30   BNP: No components found with this basename: POCBNP:5 CBG: No results found for this basename: GLUCAP:5 in the  last 168 hours  No results found for this or any previous visit (from the past 240 hour(s)).   Scheduled Meds:    . aspirin  325 mg Oral Daily  . diltiazem  180 mg Oral Daily  . disopyramide  200 mg Oral Q12H  . hydrochlorothiazide  12.5 mg Oral Daily  . levothyroxine  25 mcg Oral QAC breakfast  . losartan  50 mg Oral Daily  . sodium chloride  3 mL Intravenous Q12H  . sodium chloride  3 mL Intravenous Q12H   Continuous Infusions:    [name], MD [facility] Pager [phone]  If 7PM-7AM, please contact  night-coverage [email or web address] Password [facility] 08/30/2012, 7:38 PM   LOS: 1 day

## 2012-08-30 NOTE — Progress Notes (Signed)
    I have seen and examined the patient. I agree with the above note with the addition of : the patient had nausea and severe headache last night after she was given 1st dose of Disopyramide. She was able to tolerate today's am dose without side effects. She is in NSR and feels well otherwise.  I suggested staying on the same medication for now and observe another day.   Lorine Bears MD, [facility] 08/30/2012 1:10 PM

## 2012-08-30 NOTE — Progress Notes (Signed)
Cardiology Progress Note Patient Name: Cindy [name] Date of Encounter: 08/30/2012, 10:13 AM     Subjective  Patient reports getting headache and nausea after disopyramide dose last night and is unsure if she wants to continue this medication. Otherwise feels well without c/o cp, sob, palpitations, or dizziness.   Objective   Telemetry: Sinus rhythm 50-60s  Medications: . aspirin  325 mg Oral Daily  . diltiazem  180 mg Oral Daily  . disopyramide  200 mg Oral Q12H  . hydrochlorothiazide  12.5 mg Oral Daily  . levothyroxine  25 mcg Oral QAC breakfast  . losartan  50 mg Oral Daily  . sodium chloride  3 mL Intravenous Q12H  . sodium chloride  3 mL Intravenous Q12H      Physical Exam: Temp:  [97.4 F (36.3 C)-97.6 F (36.4 C)] 97.5 F (36.4 C) (09/25 0500) Pulse Rate:  [53-67] 53  (09/25 0500) Resp:  [16-18] 18  (09/25 0500) BP: (112-133)/(54-73) 112/54 mmHg (09/25 0500) SpO2:  [94 %-97 %] 96 % (09/25 0500)  General: Pleasant elderly female, in no acute distress. Head: Normocephalic, atraumatic, sclera non-icteric, nares are without discharge.  Neck: Supple. Negative for carotid bruits or JVD Lungs: Clear bilaterally to auscultation without wheezes, rales, or rhonchi. Breathing is unlabored. Heart: RRR S1 S2 without murmurs, rubs, or gallops.  Abdomen: Soft, non-tender, non-distended with normoactive bowel sounds. No rebound/guarding. No obvious abdominal masses. Msk:  Strength and tone appear normal for age. Extremities: No edema. No clubbing or cyanosis. Distal pedal pulses are intact and equal bilaterally. Neuro: Alert and oriented X 3. Moves all extremities spontaneously. Psych:  Responds to questions appropriately with a normal affect.   Intake/Output Summary (Last 24 hours) at 08/30/12 1013 Last data filed at 08/30/12 0900  Gross per 24 hour  Intake    960 ml  Output    400 ml  Net    560 ml    Labs:  [facility] 08/30/12 0440 08/29/12 0122  NA 133*  135  K 3.8 3.7  CL 99 98  CO2 25 25  GLUCOSE 83 97  BUN 21 21  CREATININE 0.78 0.73  CALCIUM 8.5 9.0  MG 2.2 --   Basename 08/29/12 0122  AST 18  ALT 11  ALKPHOS 60  BILITOT 0.3  PROT 6.1  ALBUMIN 3.2*   Basename 08/29/12 0122  WBC 6.1  HGB 13.7  HCT 40.2  MCV 89.7  PLT 206   Basename 08/29/12 1735 08/29/12 1045 08/29/12 0530 08/29/12 0122  CKTOTAL 134 -- -- 128  CKMB 8.7* -- -- 5.0*  TROPONINI 0.74* 0.78* 0.34* <0.30   Basename 08/30/12 0440  CHOL 160  HDL 62  LDLCALC 83  TRIG 77  CHOLHDL 2.6     08/30/2012 04:40  TSH 1.293    Radiology/Studies:   08/29/2012 - Chest Port 1 View Findings: Cardiomegaly.  Moderate hiatal hernia. No airspace disease.  No effusion. Monitoring leads are projected over the chest.  Tortuous thoracic aorta. Calcified granuloma at the left costophrenic angle.  IMPRESSION: Cardiomegaly without failure.  No acute cardiopulmonary disease. Hiatal hernia.       Assessment and Plan   1. Atrial Fibrillation w/ RVR: H/o PAF on Cardizem. Did not tolerate Amiodarone in the past. Presented yesterday with rapid rates and sob. Initiated on Disopyramide ER 200mg  BID yesterday (9/24). Patient reports having headache and nausea after one dose, not sure she wants to continue this. Currently in sinus rhythm. QTc ~  460 this am. Crt ok. TSH normal. Echo pending. Not an anticoagulation candidate given fall risk. Cont ASA 325mg   2. NSTEMI:  Suspect demand ischemia in the setting of A.fib w/ rvr and HCM. Troponin 0.34-->0.78-->0.74. No chest pain. Echo pending. With mildly elevated enzymes that trended down would defer further ischemic evaluation unless significant reduction in EF or WMAs on echo.   3. Hypertrophic cardiomyopathy: Significant LVOT gradient. Her murmur is consistent with this diagnosis. Disopyramide is a negative inotrope and can be helpful with HCM  4. Hypertension: Stable on current meds   Signed, Devlynn Knoff PA-C

## 2012-08-31 LAB — BASIC METABOLIC PANEL
CO2: 26 mEq/L (ref 19–32)
Chloride: 96 mEq/L (ref 96–112)
Sodium: 132 mEq/L — ABNORMAL LOW (ref 135–145)

## 2012-08-31 MED ORDER — DISOPYRAMIDE PHOSPHATE ER 100 MG PO CP12: 200.0000 mg | ORAL_CAPSULE | Freq: Two times a day (BID) | ORAL | Status: DC

## 2012-08-31 MED ORDER — DSS 100 MG PO CAPS: 100.0000 mg | ORAL_CAPSULE | Freq: Two times a day (BID) | ORAL | Status: DC | PRN

## 2012-08-31 MED ORDER — DOCUSATE SODIUM 100 MG PO CAPS: 100.0000 mg | ORAL_CAPSULE | Freq: Two times a day (BID) | ORAL | Status: DC | PRN

## 2012-08-31 NOTE — Discharge Summary (Signed)
Physician Discharge Summary  Cindy [name] VHQ:[id number] DOB: [birth year] DOA: 08/29/2012  PCP: Crawford Givens, MD  Admit date: 08/29/2012 Discharge date: 08/31/2012  Recommendations for Outpatient Follow-up:  1. Follow up with cardiology as recommended.   Discharge Diagnoses:  Principal Problem:  *Atrial fibrillation with RVR Active Problems:  HYPOTHYROIDISM  ANEMIA, IRON DEFICIENCY  HYPERTENSION  Hypertrophic obstructive cardiomyopathy  Palpitations   Discharge Condition: stable  Diet recommendation: low sodium diet. Filed Weights   08/29/12 0450  Weight: 58.968 kg (130 lb)    History of present illness:    Hospital Course:  Atrial fibrillation with RVR : back in sinus and rate controlled.  - she was starte don norpace, initially patient took the medication on empty stomach and had nausea and vomiting. She was able to  Better tolerate the medication with food. Her elevated troponin sec to tachycardia and demand ischemia. Cardiology consulted and no further intervention at this time . Elevated troponin  -Suspect demand ischemia from tachycardia.   -Continue aspirin 325 mg  -Hemodynamically stable, chest pain free.  Hypothyroidism  -TSH as above  -Continue Synthroid  Hypertension  -Controlled; continue diltiazem, losartan  Iron deficiency anemia  -Patient received intermittent IV iron          Consultations: cardiology  Discharge Exam: Filed Vitals:   08/30/12 1316 08/30/12 1826 08/30/12 2029 08/31/12 0449  BP: 163/100 142/86 137/44 150/43  Pulse: 6  58 59  Temp: 97.3 F (36.3 C)  97.3 F (36.3 C) 98.3 F (36.8 C)  TempSrc: Oral  Oral Oral  Resp: 19  18 19   Height:      Weight:      SpO2: 98%  100% 96%    General: Pt is alert, follows commands appropriately, not in acute distress  HEENT: No icterus, No thrush, [place]/AT  Cardiovascular: RRR, S1/S2, no rub  Respiratory: Clear to auscultation bilaterally, no wheezing, no crackles, no rhonchi    Abdomen: Soft/+BS, non tender, non distended, no guarding  Extremities: No edema, No lymphangitis, No petechiae, No rashes, no synovitis   Discharge Instructions  Discharge Orders    Future Orders Please Complete By Expires   Diet - low sodium heart healthy      Discharge instructions      Comments:   Follow up with cardiology as recommended.   Activity as tolerated - No restrictions          Medication List     As of 08/31/2012  1:01 PM    TAKE these medications         aspirin 325 MG tablet   Take 325 mg by mouth daily.      diltiazem 180 MG 24 hr capsule   Commonly known as: CARDIZEM CD   TAKE 1 CAPSULE DAILY      disopyramide 100 MG 12 hr capsule   Commonly known as: NORPACE CR   Take 2 capsules (200 mg total) by mouth every 12 (twelve) hours.      DSS 100 MG Caps   Take 100 mg by mouth 2 (two) times daily as needed.      fish oil-omega-3 fatty acids 1000 MG capsule   Take 1 g by mouth 2 (two) times daily.      hydrochlorothiazide 25 MG tablet   Commonly known as: HYDRODIURIL   Take 0.5 tablets (12.5 mg total) by mouth daily.      HYDROcodone-acetaminophen 5-500 MG per tablet   Commonly known as: VICODIN   Take 0.5  tablets by mouth every 8 (eight) hours as needed. For pain      levothyroxine 25 MCG tablet   Commonly known as: SYNTHROID, LEVOTHROID   Take 1 tablet (25 mcg total) by mouth every morning.      losartan 50 MG tablet   Commonly known as: COZAAR   TAKE 1 TABLET DAILY      Ocuvite PreserVision Tabs   Take 1 tablet by mouth daily.      ranitidine 150 MG capsule   Commonly known as: ZANTAC   Take 150 mg by mouth daily as needed. For heartburn        rOPINIRole 0.25 MG tablet   Commonly known as: REQUIP   Take 1 tablet (0.25 mg total) by mouth 3 (three) times daily as needed (for leg cramps ).      zolpidem 10 MG tablet   Commonly known as: AMBIEN   Take 0.5 tablets (5 mg total) by mouth at bedtime as needed. For sleep           The results of significant diagnostics from this hospitalization (including imaging, microbiology, ancillary and laboratory) are listed below for reference.    Significant Diagnostic Studies: Dg Chest Port 1 View  08/29/2012  *RADIOLOGY REPORT*  Clinical Data: Palpitations.  Short of breath.  Elevated blood pressure.  PORTABLE CHEST - 1 VIEW  Comparison: 01/26/2012.  06/28/2010.  Findings: Cardiomegaly.  Moderate hiatal hernia.  No airspace disease.  No effusion. Monitoring leads are projected over the chest.  Tortuous thoracic aorta. Calcified granuloma at the left costophrenic angle.  IMPRESSION: Cardiomegaly without failure.  No acute cardiopulmonary disease. Hiatal hernia.   Original Report Authenticated By: Andreas Newport, M.D.     Microbiology: No results found for this or any previous visit (from the past 240 hour(s)).   Labs: Basic Metabolic Panel:  Lab 08/31/12 1610 08/30/12 0440 08/29/12 0122  NA 132* 133* 135  K 3.9 3.8 3.7  CL 96 99 98  CO2 26 25 25   GLUCOSE 88 83 97  BUN 22 21 21   CREATININE 0.84 0.78 0.73  CALCIUM 8.7 8.5 9.0  MG -- 2.2 --  PHOS -- -- --   Liver Function Tests:  Lab 08/29/12 0122  AST 18  ALT 11  ALKPHOS 60  BILITOT 0.3  PROT 6.1  ALBUMIN 3.2*   No results found for this basename: LIPASE:5,AMYLASE:5 in the last 168 hours No results found for this basename: AMMONIA:5 in the last 168 hours CBC:  Lab 08/29/12 0122  WBC 6.1  NEUTROABS --  HGB 13.7  HCT 40.2  MCV 89.7  PLT 206   Cardiac Enzymes:  Lab 08/29/12 1735 08/29/12 1045 08/29/12 0530 08/29/12 0122  CKTOTAL 134 -- -- 128  CKMB 8.7* -- -- 5.0*  CKMBINDEX -- -- -- --  TROPONINI 0.74* 0.78* 0.34* <0.30   BNP: BNP (last 3 results) No results found for this basename: PROBNP:3 in the last 8760 hours CBG: No results found for this basename: GLUCAP:5 in the last 168 hours  Time coordinating discharge: 52 minutes  Signed:  Marquel Pottenger  [facility] 08/31/2012,  1:01 PM

## 2012-08-31 NOTE — Progress Notes (Addendum)
Patient Name: Cindy [name] Date of Encounter: 08/31/2012  Principal Problem:  *Atrial fibrillation with RVR Active Problems:  HYPOTHYROIDISM  ANEMIA, IRON DEFICIENCY  HYPERTENSION  Hypertrophic obstructive cardiomyopathy  Palpitations    SUBJECTIVE: Took Rhythmol with food yesterday and did not have nausea with it (unlike first dose). Has been OOB a little, not ambulating in the halls. Lives alone but has family help at home.  OBJECTIVE Filed Vitals:   08/30/12 1316 08/30/12 1826 08/30/12 2029 08/31/12 0449  BP: 163/100 142/86 137/44 150/43  Pulse: 6  58 59  Temp: 97.3 F (36.3 C)  97.3 F (36.3 C) 98.3 F (36.8 C)  TempSrc: Oral  Oral Oral  Resp: 19  18 19   Height:      Weight:      SpO2: 98%  100% 96%    Intake/Output Summary (Last 24 hours) at 08/31/12 0731 Last data filed at 08/30/12 1630  Gross per 24 hour  Intake    720 ml  Output   1550 ml  Net   -830 ml   Filed Weights   08/29/12 0450  Weight: 130 lb (58.968 kg)     PHYSICAL EXAM General: Well developed, well nourished, elderly female in no acute distress. Head: Normocephalic, atraumatic.  Neck: Supple without bruits, JVD not elevated. Lungs:  Resp regular and unlabored, few rales bases. Heart: RRR, S1, S2, no S3, S4, 3/6 murmur. Abdomen: Soft, non-tender, non-distended, BS + x 4.  Extremities: No clubbing, cyanosis, no edema.  Neuro: Alert and oriented X 3. Moves all extremities spontaneously. Psych: Normal affect.  LABS: CBC: Basename 08/29/12 0122  WBC 6.1  NEUTROABS --  HGB 13.7  HCT 40.2  MCV 89.7  PLT 206   INR: Basename 08/29/12 0122  INR 0.96   Basic Metabolic Panel: Basename 08/30/12 0440 08/29/12 0122  NA 133* 135  K 3.8 3.7  CL 99 98  CO2 25 25  GLUCOSE 83 97  BUN 21 21  CREATININE 0.78 0.73  CALCIUM 8.5 9.0  MG 2.2 --  PHOS -- --   Liver Function Tests: Basename 08/29/12 0122  AST 18  ALT 11  ALKPHOS 60  BILITOT 0.3  PROT 6.1  ALBUMIN 3.2*    Cardiac Enzymes: Basename 08/29/12 1735 08/29/12 1045 08/29/12 0530 08/29/12 0122  CKTOTAL 134 -- -- 128  CKMB 8.7* -- -- 5.0*  CKMBINDEX 6.5 -- -- 3.9  TROPONINI 0.74* 0.78* 0.34* --   BNP: Pro B Natriuretic peptide (BNP)  Date/Time Value Range Status  08/23/2011  9:10 PM 661.8* 0 - 450 pg/mL Final   Fasting Lipid Panel: Basename 08/30/12 0440  CHOL 160  HDL 62  LDLCALC 83  TRIG 77  CHOLHDL 2.6  LDLDIRECT --   Thyroid Function Tests: Basename 08/30/12 0440  TSH 1.293  T4TOTAL --  T3FREE --  THYROIDAB --   Anemia Panel: Basename 08/30/12 0440  VITAMINB12 --  FOLATE --  FERRITIN 28  TIBC 300  IRON 89  RETICCTPCT --    2D Echocardiogram: 08/30/2012 Study Conclusions - Left ventricle: Thre is no SAM ofthe mitral valve and no LVOT gradient. Significant upper septal thickening. Wall thickness was increased in a pattern of moderate LVH. The estimated ejection fraction was 70%. Wall motion was normal; there were no regional wall motion abnormalities. - Aortic valve: The valve is thickened with decreased opening, There is mild AS. Mild regurgitation. Mean gradient: 16mm Hg (S). Peak gradient: 28mm Hg (S). - Mitral valve: Moderate MAC There may  be mild inflow obstruction. Mild to moderate regurgitation. - Left atrium: The atrium was mildly dilated. - Right ventricle: The cavity size was mildly dilated. Systolic function was mildly reduced. - Right atrium: The atrium was mildly dilated. - Pulmonary arteries: PA peak pressure: 39mm Hg (S).  TELE:  SR, Sinus brady, into the high 40s overnight with rare PVCs - asymptomatic  ECG: 31-Aug-2012 04:46:54   Sinus bradycardia with 1st degree A-V block Non-specific intra-ventricular conduction block Cannot rule out Septal infarct , age undetermined Vent. rate 58 BPM PR interval 232 ms QRS duration 138 ms QT/QTc 512/502 ms P-R-T axes 71 -24 68  Radiology/Studies: Dg Chest Port 1 View 08/29/2012  *RADIOLOGY REPORT*   Clinical Data: Palpitations.  Short of breath.  Elevated blood pressure.  PORTABLE CHEST - 1 VIEW  Comparison: 01/26/2012.  06/28/2010.  Findings: Cardiomegaly.  Moderate hiatal hernia.  No airspace disease.  No effusion. Monitoring leads are projected over the chest.  Tortuous thoracic aorta. Calcified granuloma at the left costophrenic angle.  IMPRESSION: Cardiomegaly without failure.  No acute cardiopulmonary disease. Hiatal hernia.   Original Report Authenticated By: Andreas Newport, M.D.     Current Medications:    . aspirin  325 mg Oral Daily  . diltiazem  180 mg Oral Daily  . disopyramide  200 mg Oral Q12H  . hydrochlorothiazide  12.5 mg Oral Daily  . levothyroxine  25 mcg Oral QAC breakfast  . losartan  50 mg Oral Daily  . sodium chloride  3 mL Intravenous Q12H  . sodium chloride  3 mL Intravenous Q12H      ASSESSMENT AND PLAN: Principal Problem:  *Atrial fibrillation with RVR - maintaining SR on Rhythmol, continue, no significant change in QT/QTc. Change time so she can take with food, tolerates it better that way. Consider decrease Diltiazem to 120 mg daily with bradycardia, or maybe should d/c and add Norvasc for BP management.   Active Problems:  HYPOTHYROIDISM - TSH OK on current dose   ANEMIA, IRON DEFICIENCY   HYPERTENSION - will need to increase Losartan to 100 mg daily if decrease or d/c Cardizem   Hypertrophic obstructive cardiomyopathy - MD review echo and advise.   Palpitations - none in SR  Plan - d/c when medically stable, increase activity.  Signed, Theodore Demark , PA-C 7:31 AM 08/31/2012  Patient seen and examined.  Agree with findings as noted by R Barrett Patient currently denies SOB  No dizziness.  Exam:  Lungs CTA  Card:  RRR  III/VI systolic murmur LSB  Ext No edema  EKG:  SR  QRS .  QTc 504. Tele:  SB to SR  40s to 60s  IMpression:  Continue Norpace.  Except for first dose (felt very bad after--"like head was going to explode") she  has tolerated.  Taking after meals.  No dizziness.  No CHF symptoms. I would continue at current dose.  I would not increase.   Patient needs to ambulate  Follow for dizziness.   Repeat EKG later.  Some prolongation of QT and QRS expected. Probable d/c later today if feels OK  Will make sure has f/u.  Dietrich Pates 8:46 AM

## 2012-09-02 ENCOUNTER — Emergency Department (HOSPITAL_COMMUNITY)
Admission: EM | Admit: 2012-09-02 | Discharge: 2012-09-03 | Disposition: A | Discharge status: Home / Self Care | Payer: Medicare Other | Attending: Emergency Medicine | Admitting: Emergency Medicine

## 2012-09-02 ENCOUNTER — Encounter (HOSPITAL_COMMUNITY): Payer: Self-pay | Admitting: Emergency Medicine

## 2012-09-02 DIAGNOSIS — M810 Age-related osteoporosis without current pathological fracture: Secondary | ICD-10-CM | POA: Insufficient documentation

## 2012-09-02 DIAGNOSIS — E7800 Pure hypercholesterolemia, unspecified: Secondary | ICD-10-CM | POA: Insufficient documentation

## 2012-09-02 DIAGNOSIS — I10 Essential (primary) hypertension: Secondary | ICD-10-CM | POA: Insufficient documentation

## 2012-09-02 DIAGNOSIS — Z87891 Personal history of nicotine dependence: Secondary | ICD-10-CM | POA: Insufficient documentation

## 2012-09-02 DIAGNOSIS — I4891 Unspecified atrial fibrillation: Secondary | ICD-10-CM | POA: Insufficient documentation

## 2012-09-02 DIAGNOSIS — E039 Hypothyroidism, unspecified: Secondary | ICD-10-CM | POA: Insufficient documentation

## 2012-09-02 DIAGNOSIS — D509 Iron deficiency anemia, unspecified: Secondary | ICD-10-CM | POA: Insufficient documentation

## 2012-09-02 DIAGNOSIS — R51 Headache: Secondary | ICD-10-CM | POA: Insufficient documentation

## 2012-09-02 LAB — BASIC METABOLIC PANEL
CO2: 25 mEq/L (ref 19–32)
Glucose, Bld: 96 mg/dL (ref 70–99)
Potassium: 3.9 mEq/L (ref 3.5–5.1)
Sodium: 136 mEq/L (ref 135–145)

## 2012-09-02 MED ORDER — FENTANYL CITRATE 0.05 MG/ML IJ SOLN
25.0000 ug | Freq: Once | INTRAMUSCULAR | Status: AC
  Administered 2012-09-03: 25 ug via INTRAVENOUS
  Filled 2012-09-02: qty 2

## 2012-09-02 NOTE — ED Notes (Signed)
I gave the patient a warm blanket. 

## 2012-09-02 NOTE — ED Provider Notes (Signed)
History     CSN: [id number]  Arrival date & time 09/02/12  4098   First MD Initiated Contact with Patient 09/02/12 1929      Chief Complaint  Patient presents with  . Hypertension  . Headache    (Consider location/radiation/quality/duration/timing/severity/associated sxs/prior treatment) Patient is a 78 y.o. female presenting with hypertension. The history is provided by the patient.  Hypertension The current episode started today. The problem occurs constantly. Associated symptoms include headaches. Pertinent negatives include no abdominal pain, chest pain or fever. Associated symptoms comments: She reports she took her blood pressure today and found it to be elevated with a systolic reading of over 180. She states EMS was contacted and found her pressure elevated as well. She denies chest pain, SOB, nausea or visual changes. She takes her blood pressure everyday on a machine at home and yesterday it was normal. No recent changes to her medications and she is compliant with taking them as directed without missing any doses. .    Past Medical History  Diagnosis Date  . Atrial fibrillation 04/23-24/2007    [facility] AFib, CT chest negative, CVTS/ P.E.  . Hypertrophic cardiomyopathy   . Urinary incontinence   . Diverticulosis of colon (without mention of hemorrhage) 2003/ 08/2000    EGD/colonoscopy Barretts esophagus//H.H divertics 08/2000  . Cervical mass     C2 lateral mass fracture  . Hypertension   . Hypercholesterolemia     219/497  . Hypothyroidism   . Multinodular goiter (nontoxic)   . Osteoporosis   . Blood transfusion   . Jaundice ~ 1935    "in grade school"  . Degenerative joint disease     back  . Depression     "husband died Feb 12, 2011"  . Anemia, iron deficiency   . Personal history of colonic polyps 02/29/2012    tubular adenoma  . Barrett's esophagus   . Shortness of breath     Past Surgical History  Procedure Date  . Bladder surgery     bladder tack early  90's  . Tear duct probing 07/29/03    tear duck surg  . Cystourethroscopy 09/17/03  . Rotator cuff repair ? date; 09/07/05    left; right( Dr. Darrelyn Hillock)  . Appendectomy 1941  . Breast surgery 1981    breast reduction  . Eye surgery 03/2002    cataract OS  . Thyroid ultrasound 10/14/2003    MNG, no dominant masses  . Doppler echocardiography 03/05/2002&09/11/2003    ECHO, EF wnl, mild stenosis, A.S. mild MR, Mild T.R03/31/2003//ECHO EF 70%,LVH, ?diast dysfunction 09/11/2003  . Cataract extraction w/ intraocular lens  implant, bilateral 2003  . Dilation and curettage of uterus 09/07/2000    endometrial polyps removed, path all benign   . Tonsillectomy and adenoidectomy     "as a child"  . Fracture surgery 2010    right knee    Family History  Problem Relation Age of Onset  . Heart failure Mother     CHF, DM, HBP  . Hypertension Mother   . Uterine cancer Mother   . Stroke Mother   . Colon cancer Neg Hx   . Esophageal cancer Neg Hx   . Rectal cancer Neg Hx   . Stomach cancer Neg Hx     History  Substance Use Topics  . Smoking status: Former Smoker -- 0.5 packs/day for 4 years    Types: Cigarettes    Quit date: 07/06/1974  . Smokeless tobacco: Never Used  . Alcohol Use: No  OB History    Grav Para Term Preterm Abortions TAB SAB Ect Mult Living                  Review of Systems  Constitutional: Negative for fever.  Respiratory: Negative for shortness of breath.   Cardiovascular: Negative for chest pain and leg swelling.  Gastrointestinal: Negative for abdominal pain.  Genitourinary: Negative for dysuria.  Musculoskeletal: Negative for back pain.  Neurological: Positive for headaches.    Allergies  Irbesartan; Ramipril; Telmisartan-hctz; and Morphine and related  Home Medications   Current Outpatient Rx  Name Route Sig Dispense Refill  . ASPIRIN 325 MG PO TABS Oral Take 325 mg by mouth daily.      Marland Kitchen DILTIAZEM HCL ER COATED BEADS 180 MG PO CP24 Oral Take  180 mg by mouth daily.    Marland Kitchen DISOPYRAMIDE PHOSPHATE ER 100 MG PO CP12 Oral Take 200 mg by mouth every 12 (twelve) hours.    . DSS 100 MG PO CAPS Oral Take 100 mg by mouth 2 (two) times daily as needed.    . OMEGA-3 FATTY ACIDS 1000 MG PO CAPS Oral Take 1 g by mouth 2 (two) times daily.      Marland Kitchen HYDROCHLOROTHIAZIDE 25 MG PO TABS Oral Take 12.5 mg by mouth daily.    Marland Kitchen HYDROCODONE-ACETAMINOPHEN 5-500 MG PO TABS Oral Take 0.5 tablets by mouth every 8 (eight) hours as needed. For pain 90 tablet 1  . LEVOTHYROXINE SODIUM 25 MCG PO TABS Oral Take 25 mcg by mouth every morning.    Marland Kitchen LOSARTAN POTASSIUM 50 MG PO TABS Oral Take 50 mg by mouth daily.    Idolina Primer PRESERVISION PO TABS Oral Take 1 tablet by mouth daily.      Marland Kitchen RANITIDINE HCL 150 MG PO CAPS Oral Take 150 mg by mouth daily as needed. For heartburn     . ROPINIROLE HCL 0.25 MG PO TABS Oral Take 1 tablet (0.25 mg total) by mouth 3 (three) times daily as needed (for leg cramps ). 30 tablet 0  . ZOLPIDEM TARTRATE 10 MG PO TABS Oral Take 5 mg by mouth at bedtime as needed. For sleep      BP 182/45  Pulse 66  Temp 97.8 F (36.6 C) (Oral)  Resp 18  SpO2 98%  Physical Exam  Constitutional: She is oriented to person, place, and time. She appears well-developed and well-nourished.  HENT:  Head: Normocephalic.  Neck: Normal range of motion. Neck supple.  Cardiovascular: Normal rate and regular rhythm.   Murmur heard. Pulmonary/Chest: Effort normal and breath sounds normal.  Abdominal: Soft. Bowel sounds are normal. There is no tenderness. There is no rebound and no guarding.  Musculoskeletal: Normal range of motion.  Neurological: She is alert and oriented to person, place, and time.  Skin: Skin is warm and dry. No rash noted.  Psychiatric: She has a normal mood and affect.    ED Course  Procedures (including critical care time)   Labs Reviewed  BASIC METABOLIC PANEL   Results for orders placed during the hospital encounter of 09/02/12   BASIC METABOLIC PANEL      Component Value Range   Sodium 136  135 - 145 mEq/L   Potassium 3.9  3.5 - 5.1 mEq/L   Chloride 102  96 - 112 mEq/L   CO2 25  19 - 32 mEq/L   Glucose, Bld 96  70 - 99 mg/dL   BUN 13  6 - 23 mg/dL  Creatinine, Ser 0.69  0.50 - 1.10 mg/dL   Calcium 9.1  8.4 - 47.8 mg/dL   GFR calc non Af Amer 75 (*) >90 mL/min   GFR calc Af Amer 87 (*) >90 mL/min    No results found.   No diagnosis found. 1. Hypertension 2. Headache, recurrent    MDM  Blood pressure remains mildly elevated. She has a normal exam, vitals otherwise stable. She complains of headache and reports she has the same headache on a recurrent basis that has been fully evaluated by neurology and by primary care. Morphine given for pain. Renal functions WNL.         Rodena Medin, PA-C 09/02/12 2328

## 2012-09-02 NOTE — ED Provider Notes (Signed)
Medical screening examination/treatment/procedure(s) were performed by non-physician practitioner and as supervising physician I was immediately available for consultation/collaboration.   Payge Eppes B. Bernette Mayers, MD 09/02/12 2329

## 2012-09-02 NOTE — ED Notes (Signed)
3p - h/a's. sbp > 200/100's. No neuro deficits. Hurts pretty bad.

## 2012-09-03 NOTE — ED Notes (Signed)
I gave the patient a cup of ice and a ginger-ale. 

## 2012-09-04 ENCOUNTER — Encounter: Payer: Self-pay | Admitting: Family Medicine

## 2012-09-04 ENCOUNTER — Ambulatory Visit (INDEPENDENT_AMBULATORY_CARE_PROVIDER_SITE_OTHER): Payer: Medicare Other | Admitting: Family Medicine

## 2012-09-04 ENCOUNTER — Other Ambulatory Visit: Payer: Self-pay | Admitting: *Deleted

## 2012-09-04 VITALS — BP 138/70 | HR 83 | Temp 98.1°F | Wt 131.8 lb

## 2012-09-04 DIAGNOSIS — I4891 Unspecified atrial fibrillation: Secondary | ICD-10-CM

## 2012-09-04 MED ORDER — DILTIAZEM HCL ER COATED BEADS 180 MG PO CP24: 180.0000 mg | ORAL_CAPSULE | Freq: Every day | ORAL | Status: DC

## 2012-09-04 NOTE — Patient Instructions (Addendum)
Don't change your meds for now.  Stay off the norpace (the new medicine) and I'll notify cardiology about what is going on in the meantime.  Take care.

## 2012-09-04 NOTE — Progress Notes (Signed)
Admitted with Afib and tachcardia.  Was started on norpace; she didn't tolerate the initial dose but was able to tolerate the future doses as inpatient.  She continued on it after discharge but since then she has stopped it due to dry mouth and constipation.  She hasn't taken it for 4 days.  She has less dry mouth and she had a BM this AM.  No more palpitations in meantime.    She was seen at ER since discharge for elevated BP. Was discharged home with unremarkable labs.    She has not had CP, BLE edema, or severe palpitations in last few days (nothing similar to the episode prior to admission).  She feel occ skipped beats.  No presyncope, no syncope.   Meds, vitals, and allergies reviewed.   ROS: See HPI.  Otherwise, noncontributory.  nad ncat Mmm rrr with murmur noted ctab abd soft, not ttp Ext w/o edema  ER and hospital notes reviewed.

## 2012-09-05 NOTE — Assessment & Plan Note (Signed)
Sound to be regular today.  I didn't change her meds as she was not tachy and BP was controlled. Will defer to cards at this point.  She has f/u scheduled for later in the week.  She agrees.  With plan.  >25 min spent with face to face with patient, >50% counseling and/or coordinating care.

## 2012-09-06 ENCOUNTER — Telehealth: Payer: Self-pay | Admitting: Family Medicine

## 2012-09-06 ENCOUNTER — Telehealth: Payer: Self-pay | Admitting: *Deleted

## 2012-09-06 NOTE — Telephone Encounter (Signed)
Robin at [facility] notified as instructed by telephone.

## 2012-09-06 NOTE — Telephone Encounter (Signed)
Notify pt.  Per EMR this was sent on 9/30 to mail order pharmacy.  Please clarify this with the pharmacy or send again if needed.

## 2012-09-06 NOTE — Telephone Encounter (Signed)
Caller: [name]/Patient; Patient Name: Cindy [name]; PCP: Crawford Givens Clelia Croft) [facility]); [facility] Call[facility] Phone Number: [phone] Patient seen in clinic on Monday, 09/30. States at visit she gave a sheet to the nurse for a refill on her  Diltiazem to be sent to [facility] mail order pharmacy once completed. Pharmacy has contacted patient this am ( 10/02) and advised they are unable to get in touch with clinic to receive refill order on Diltiazem.  Pharmacy told patient they would need order for medication in by Oct. 4th in order to fill on time.  Patient reports she has approximately 8 tabs on hand.  Patient states she is not taking Norpac as discussed at appointment by Dr. Para March on 09/30. Advised patient will send note to Dr. Para March for med request. Caller verbalized understanding and agreement.

## 2012-09-06 NOTE — Telephone Encounter (Signed)
She's already off the norpace.

## 2012-09-06 NOTE — Telephone Encounter (Signed)
Pharmacist called stating that there is a drug interaction with the Diltiazem and Norpace. Was advised by pharmacist that patient got a script filled for Norpace at [facility] on 08/31/12. The script was written by Dr. Marguarite Arbour.. Pharmacist wanted to alert Dr. Para March of this. Please advise.

## 2012-09-06 NOTE — Telephone Encounter (Signed)
Patient notified as instructed by telephone. Called and spoke to [place] Scientist, research (physical sciences)) with [facility] and was advised that they are processing the Diltiazem order now.

## 2012-09-07 ENCOUNTER — Encounter: Payer: Medicare Other | Admitting: Physician Assistant

## 2012-09-08 ENCOUNTER — Ambulatory Visit (INDEPENDENT_AMBULATORY_CARE_PROVIDER_SITE_OTHER): Payer: Medicare Other | Admitting: Nurse Practitioner

## 2012-09-08 ENCOUNTER — Encounter: Payer: Self-pay | Admitting: Nurse Practitioner

## 2012-09-08 VITALS — BP 122/50 | HR 70 | Ht 60.0 in | Wt 130.8 lb

## 2012-09-08 DIAGNOSIS — I480 Paroxysmal atrial fibrillation: Secondary | ICD-10-CM

## 2012-09-08 DIAGNOSIS — I4891 Unspecified atrial fibrillation: Secondary | ICD-10-CM

## 2012-09-08 MED ORDER — CARVEDILOL 3.125 MG PO TABS: 3.1250 mg | ORAL_TABLET | Freq: Two times a day (BID) | ORAL | Status: DC

## 2012-09-08 MED ORDER — LOSARTAN POTASSIUM 50 MG PO TABS: 25.0000 mg | ORAL_TABLET | Freq: Every day | ORAL | Status: DC

## 2012-09-08 NOTE — Progress Notes (Signed)
Cindy [name] Date of Birth: 1922/03/18 Medical Record #[id number]  History of Present Illness: Cindy [name] is seen back today for a one week check. She is seen for Dr. Jens Som. She is an 78 year old female with multiple medical issues. These include PAF, HOCM, negative Myoview in 2007. Had been on amiodarone in the past but had to be stopped due to side effects (not defined). Not a candidate for coumadin. Has had prior GI bleeding and is at increased risk of falls. Has an iron deficiency anemia. She was recently hospitalized last week with recurrent atrial fib with RVR. Norpace was started. She did have an elevated troponin but this was felt to be due to demand ischemia.   She comes in today. She is here with her son. She says she is not doing well. She is quite upset. She says she had every side effect listed on the printout for the Norpace. She therefore, did not take it upon going back home. She says her head hurts. Her blood pressure is going up and down. She is always short of breath and that seems to be at her baseline. No reports of chest pain. Says she is "just going to stay home" the next time she goes out of rhythm. Has been back to the ER because her head hurt and her blood pressure was up. Has been back to see her PCP since discharge as well. Those notes are reviewed as well today.   Current Outpatient Prescriptions on File Prior to Visit  Medication Sig Dispense Refill  . aspirin 325 MG tablet Take 325 mg by mouth daily.        Marland Kitchen diltiazem (CARDIZEM CD) 180 MG 24 hr capsule Take 1 capsule (180 mg total) by mouth daily.  90 capsule  3  . Docusate Sodium (DSS) 100 MG CAPS Take 100 mg by mouth 2 (two) times daily as needed.      . fish oil-omega-3 fatty acids 1000 MG capsule Take 1 g by mouth 2 (two) times daily.        . hydrochlorothiazide (HYDRODIURIL) 25 MG tablet Take 12.5 mg by mouth daily.      Marland Kitchen HYDROcodone-acetaminophen (VICODIN) 5-500 MG per tablet Take 0.5 tablets by mouth  every 8 (eight) hours as needed. For pain  90 tablet  1  . levothyroxine (SYNTHROID, LEVOTHROID) 25 MCG tablet Take 25 mcg by mouth every morning.      Marland Kitchen losartan (COZAAR) 50 MG tablet Take 50 mg by mouth daily.      . Multiple Vitamins-Minerals (OCUVITE PRESERVISION) TABS Take 1 tablet by mouth daily.        . ranitidine (ZANTAC) 150 MG capsule Take 150 mg by mouth daily as needed. For heartburn       . rOPINIRole (REQUIP) 0.25 MG tablet Take 1 tablet (0.25 mg total) by mouth 3 (three) times daily as needed (for leg cramps ).  30 tablet  0  . zolpidem (AMBIEN) 10 MG tablet Take 5 mg by mouth at bedtime as needed. For sleep        Allergies  Allergen Reactions  . Irbesartan     REACTION: swelling  . Norpace (Disopyramide)     Dry mouth  . Ramipril     REACTION: lips swelling  . Telmisartan-Hctz     REACTION: incontinence  . Morphine And Related Other (See Comments)    "drives me crazy"    Past Medical History  Diagnosis Date  . Atrial  fibrillation 04/23-24/2007    [facility] AFib, CT chest negative, CVTS/ P.E.; recurrent PAF with RVR in September 2013  . Hypertrophic cardiomyopathy   . Urinary incontinence   . Diverticulosis of colon (without mention of hemorrhage) 2003/ 08/2000    EGD/colonoscopy Barretts esophagus//H.H divertics 08/2000  . Cervical mass     C2 lateral mass fracture  . Hypertension   . Hypercholesterolemia     219/497  . Hypothyroidism   . Multinodular goiter (nontoxic)   . Osteoporosis   . Blood transfusion   . Jaundice ~ 1935    "in grade school"  . Degenerative joint disease     back  . Depression     "husband died February 23, 2011"  . Anemia, iron deficiency   . Personal history of colonic polyps 02/29/2012    tubular adenoma  . Barrett's esophagus   . Shortness of breath     Past Surgical History  Procedure Date  . Bladder surgery     bladder tack early 90's  . Tear duct probing 07/29/03    tear duck surg  . Cystourethroscopy 09/17/03  . Rotator cuff  repair ? date; 09/07/05    left; right( Dr. Darrelyn Hillock)  . Appendectomy 1941  . Breast surgery 1981    breast reduction  . Eye surgery 03/2002    cataract OS  . Thyroid ultrasound 10/14/2003    MNG, no dominant masses  . Doppler echocardiography 03/05/2002&09/11/2003    ECHO, EF wnl, mild stenosis, A.S. mild MR, Mild T.R03/31/2003//ECHO EF 70%,LVH, ?diast dysfunction 09/11/2003  . Cataract extraction w/ intraocular lens  implant, bilateral 2003  . Dilation and curettage of uterus 09/07/2000    endometrial polyps removed, path all benign   . Tonsillectomy and adenoidectomy     "as a child"  . Fracture surgery 2010    right knee    History  Smoking status  . Former Smoker -- 0.5 packs/day for 4 years  . Types: Cigarettes  . Quit date: 07/06/1974  Smokeless tobacco  . Never Used    History  Alcohol Use No    Family History  Problem Relation Age of Onset  . Heart failure Mother     CHF, DM, HBP  . Hypertension Mother   . Uterine cancer Mother   . Stroke Mother   . Colon cancer Neg Hx   . Esophageal cancer Neg Hx   . Rectal cancer Neg Hx   . Stomach cancer Neg Hx     Review of Systems: The review of systems is per the HPI.  All other systems were reviewed and are negative.  Physical Exam: BP 122/50  Pulse 70  Ht 5' (1.524 m)  Wt 130 lb 12.8 oz (59.33 kg)  BMI 25.54 kg/m2 Patient is very pleasant elderly female who was short of breath with walking into the office. She is not in any acute distress. Skin is warm and dry. Color is normal.  HEENT is unremarkable. Normocephalic/atraumatic. PERRL. Sclera are nonicteric. Neck is supple. No masses. No JVD. Lungs are clear. Cardiac exam shows a regular rate and rhythm.She has a harsh 2/6 systolic murmur noted. Abdomen is soft. Extremities are without edema. Gait and ROM are intact. No gross neurologic deficits noted.   LABORATORY DATA: EKG today shows sinus rhythm. She has anteroseptal Q's which are unchanged.   Echo Study  Conclusions September 2013  - Left ventricle: Thre is no SAM ofthe mitral valve and no LVOT gradient. Significant upper septal thickening. Wall thickness was increased  in a pattern of moderate LVH. The estimated ejection fraction was 70%. Wall motion was normal; there were no regional wall motion abnormalities. - Aortic valve: The valve is thickened with decreased opening, There is mild AS. Mild regurgitation. Mean gradient: 16mm Hg (S). Peak gradient: 28mm Hg (S). - Mitral valve: Moderate MAC There may be mild inflow obstruction. Mild to moderate regurgitation. - Left atrium: The atrium was mildly dilated. - Right ventricle: The cavity size was mildly dilated. Systolic function was mildly reduced. - Right atrium: The atrium was mildly dilated. - Pulmonary arteries: PA peak pressure: 39mm Hg (S).   Dg Chest Port 1 View  08/29/2012  PORTABLE CHEST - 1 VIEW  Comparison: 01/26/2012.  06/28/2010.  Findings: Cardiomegaly.  Moderate hiatal hernia.  No airspace disease.  No effusion. Monitoring leads are projected over the chest.  Tortuous thoracic aorta. Calcified granuloma at the left costophrenic angle.  IMPRESSION: Cardiomegaly without failure.  No acute cardiopulmonary disease. Hiatal hernia.   Original Report Authenticated By: Andreas Newport, M.D.     Lab Results  Component Value Date   WBC 6.1 08/29/2012   HGB 13.7 08/29/2012   HCT 40.2 08/29/2012   PLT 206 08/29/2012   GLUCOSE 96 09/02/2012   CHOL 160 08/30/2012   TRIG 77 08/30/2012   HDL 62 08/30/2012   LDLDIRECT 132.8 08/16/2007   LDLCALC 83 08/30/2012   ALT 11 08/29/2012   AST 18 08/29/2012   NA 136 09/02/2012   K 3.9 09/02/2012   CL 102 09/02/2012   CREATININE 0.69 09/02/2012   BUN 13 09/02/2012   CO2 25 09/02/2012   TSH 1.293 08/30/2012   INR 0.96 08/29/2012   MICROALBUR 0.4 02/11/2010     Assessment / Plan: 1. PAF - has not tolerated amiodarone and now Norpace. I do not get the feeling that she would tolerate any type of  antiarrhythmic therapy. She is not a candidate for coumadin given her past history of bleeding and falls.  2. HOCM - will try her on some low dose beta blocker. This may help with her heart rate/rhythm as well.   3. HTN - she reports labile blood pressures at home. She is good here today. Would want to avoid hypotension. After discussing with Dr. Jens Som, we are going to cut the Losartan back to just 25 mg and add Coreg 3.125 mg BID.  She will be seen back in about a month. Try to titrate her meds as possible but unfortunately, I think tolerance is going to be an issue.   Patient is agreeable to this plan and will call if any problems develop in the interim.

## 2012-09-08 NOTE — Patient Instructions (Signed)
Cut the Losartan in half and take just 25MG  a day  Try Coreg 3.125 mg two times a day  Stay off the Norpace  See Dr. Jens Som in a month  Call the [facility] office at [phone] if you have any questions, problems or concerns.

## 2012-10-10 ENCOUNTER — Telehealth: Payer: Self-pay | Admitting: Cardiology

## 2012-10-10 NOTE — Telephone Encounter (Signed)
Spoke with pt, she called to let us know she was unable to tolerate the coreg. She reports her pharm and PCP told her to stop the med. She has stopped and reports feeling much better. She states she is not going to take any more meds new meds at this time and does not want a follow up appt with dr Jens Som at this time. Advised pt to make sure to follow up with her PCP regarding her atrial fib and heart. Pt voiced understanding.

## 2012-10-10 NOTE — Telephone Encounter (Signed)
plz return call to pt at hm# [phone] to discuss issues with medication.

## 2012-10-12 ENCOUNTER — Ambulatory Visit: Payer: Medicare Other | Admitting: Cardiology

## 2012-10-26 ENCOUNTER — Other Ambulatory Visit: Payer: Self-pay | Admitting: Family Medicine

## 2012-10-26 IMAGING — CR DG CHEST 1V PORT
1 series · 1 of 1 positions shown · non-contrast
Comparison: 01/26/2012.  06/28/2010.

CLINICAL DATA: Palpitations.  Short of breath.  Elevated blood
pressure.

PORTABLE CHEST - 1 VIEW

[AP]
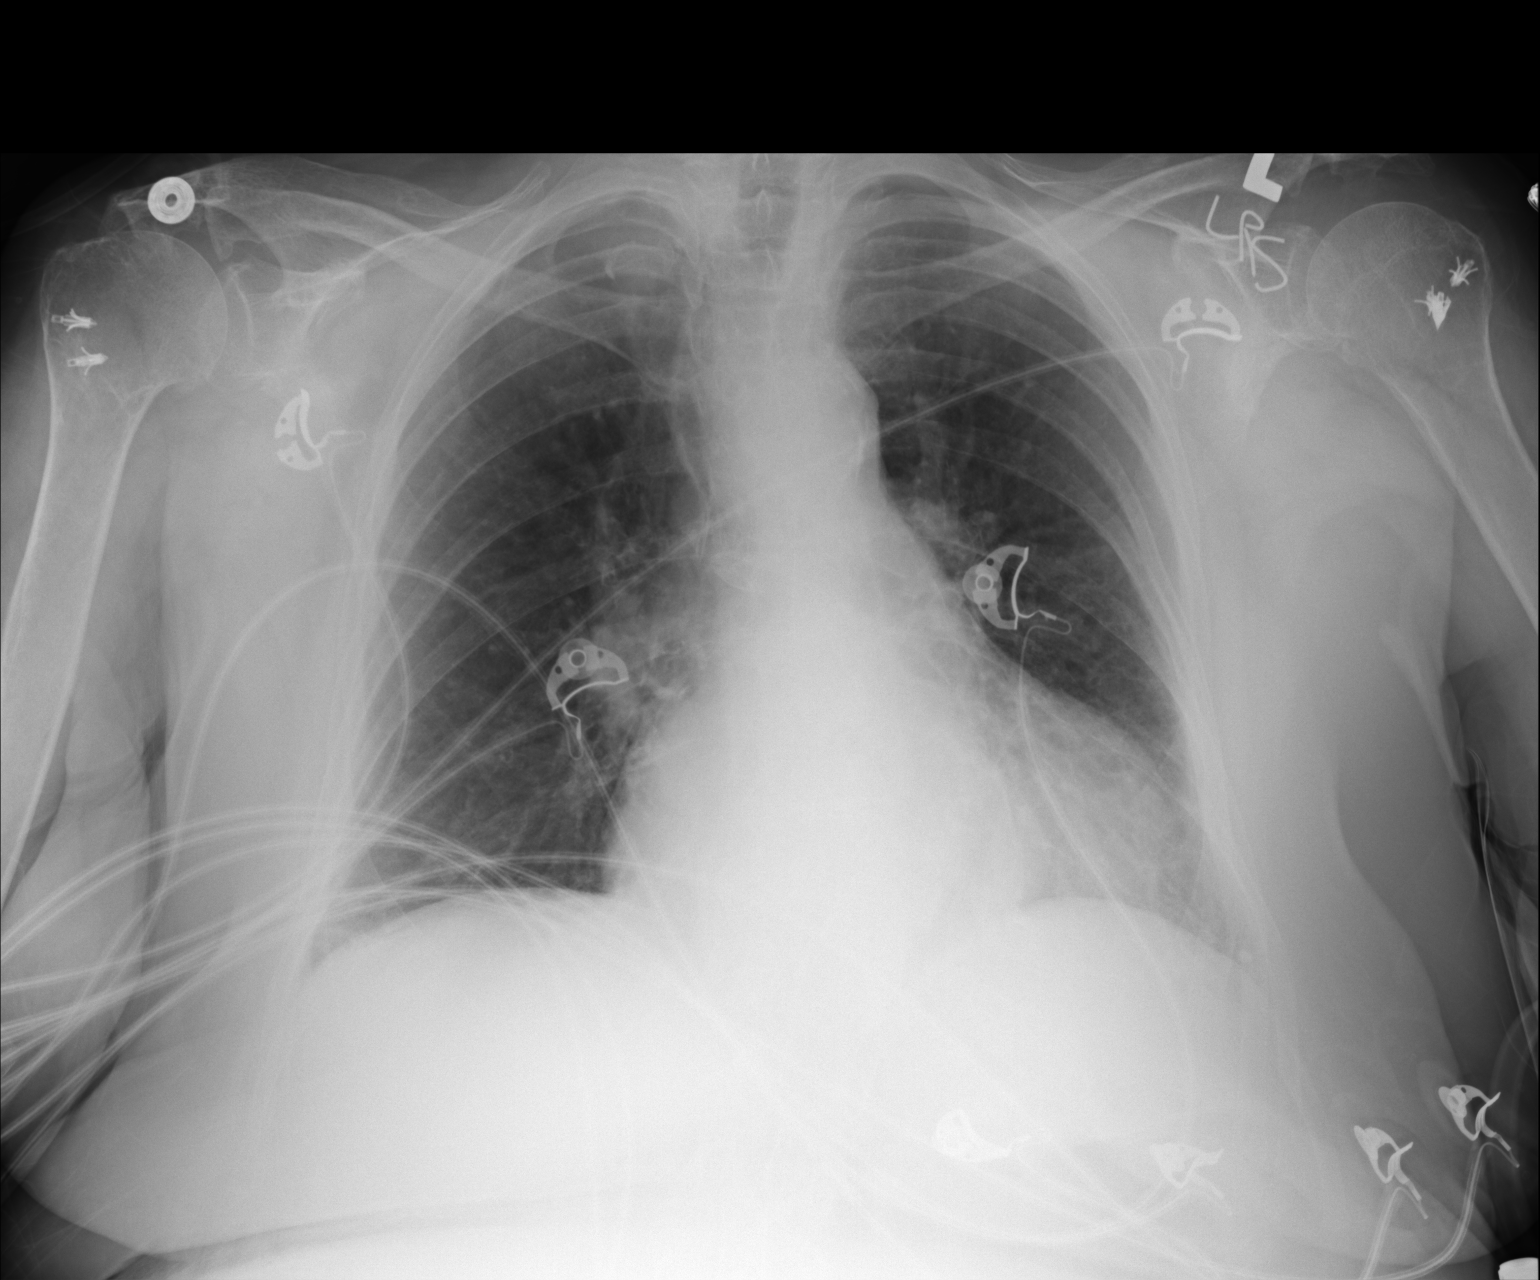

[1 of 1 positions shown; findings below may reference images not displayed]

FINDINGS: Cardiomegaly.  Moderate hiatal hernia.  No airspace
disease.  No effusion. Monitoring leads are projected over the
chest.  Tortuous thoracic aorta. Calcified granuloma at the left
costophrenic angle.
IMPRESSION: Cardiomegaly without failure.  No acute cardiopulmonary disease.
Hiatal hernia.

## 2012-12-06 DIAGNOSIS — I340 Nonrheumatic mitral (valve) insufficiency: Secondary | ICD-10-CM

## 2012-12-06 DIAGNOSIS — I350 Nonrheumatic aortic (valve) stenosis: Secondary | ICD-10-CM

## 2012-12-06 HISTORY — DX: Nonrheumatic mitral (valve) insufficiency: I34.0

## 2012-12-06 HISTORY — DX: Nonrheumatic aortic (valve) stenosis: I35.0

## 2012-12-07 ENCOUNTER — Encounter (INDEPENDENT_AMBULATORY_CARE_PROVIDER_SITE_OTHER): Payer: Medicare Other | Admitting: Ophthalmology

## 2012-12-07 DIAGNOSIS — I10 Essential (primary) hypertension: Secondary | ICD-10-CM

## 2012-12-07 DIAGNOSIS — H43819 Vitreous degeneration, unspecified eye: Secondary | ICD-10-CM

## 2012-12-07 DIAGNOSIS — H353290 Exudative age-related macular degeneration, unspecified eye, stage unspecified: Secondary | ICD-10-CM

## 2012-12-07 DIAGNOSIS — H35039 Hypertensive retinopathy, unspecified eye: Secondary | ICD-10-CM

## 2012-12-07 DIAGNOSIS — H2700 Aphakia, unspecified eye: Secondary | ICD-10-CM

## 2012-12-27 ENCOUNTER — Ambulatory Visit (INDEPENDENT_AMBULATORY_CARE_PROVIDER_SITE_OTHER): Payer: Medicare Other | Admitting: Family Medicine

## 2012-12-27 ENCOUNTER — Encounter: Payer: Self-pay | Admitting: Family Medicine

## 2012-12-27 ENCOUNTER — Telehealth: Payer: Self-pay | Admitting: Cardiology

## 2012-12-27 VITALS — BP 144/50 | HR 68 | Temp 97.4°F | Wt 131.0 lb

## 2012-12-27 DIAGNOSIS — H539 Unspecified visual disturbance: Secondary | ICD-10-CM

## 2012-12-27 DIAGNOSIS — G4700 Insomnia, unspecified: Secondary | ICD-10-CM

## 2012-12-27 DIAGNOSIS — I4891 Unspecified atrial fibrillation: Secondary | ICD-10-CM

## 2012-12-27 DIAGNOSIS — Z23 Encounter for immunization: Secondary | ICD-10-CM

## 2012-12-27 DIAGNOSIS — M549 Dorsalgia, unspecified: Secondary | ICD-10-CM

## 2012-12-27 MED ORDER — ZOLPIDEM TARTRATE 5 MG PO TABS: 2.5000 mg | ORAL_TABLET | Freq: Every evening | ORAL | Status: DC | PRN

## 2012-12-27 MED ORDER — HYDROCODONE-ACETAMINOPHEN 5-325 MG PO TABS: 0.5000 | ORAL_TABLET | Freq: Two times a day (BID) | ORAL | Status: DC | PRN

## 2012-12-27 NOTE — Telephone Encounter (Signed)
Will make dr crenshaw aware 

## 2012-12-27 NOTE — Patient Instructions (Addendum)
Start back on the vicodin for your back.  Take the ambien if needed.  See Cindy [name] about your referral before you leave today.

## 2012-12-27 NOTE — Telephone Encounter (Signed)
Per Shirlee Limerick pt doesn't want to see Dr. Jens Som any more she requested Dr. [place] so have set an appt up with Dr. [place] for her

## 2012-12-28 ENCOUNTER — Encounter: Payer: Self-pay | Admitting: Family Medicine

## 2012-12-28 DIAGNOSIS — G4700 Insomnia, unspecified: Secondary | ICD-10-CM | POA: Insufficient documentation

## 2012-12-28 NOTE — Assessment & Plan Note (Signed)
She has thoracic spondylosis, especially in the lower thoracic spine, with associated kyphosis and mild thoracolumbar scoliosis.  She had done well with very low vicodin dose and it would be reasonable to restart.  rx given to patient.

## 2012-12-28 NOTE — Assessment & Plan Note (Signed)
Per ophtho, they are working on modifications.

## 2012-12-28 NOTE — Assessment & Plan Note (Signed)
Not tachy currently, I would like her to see cards and again.  She would like second opinion and we'll try to arrange this.  I didn't add back the BB today.

## 2012-12-28 NOTE — Progress Notes (Signed)
This was a 45 min face to face pt encounter.   Insomnia persists.  Longstanding.  She had been taking 2.5-5mg  of ambien qhs nightly with some relief but had run out of medicine.  No ADE on the medicine.  Asking for refill.   Longstanding back pain for which she would take 1/2 to 1 vicodin total per day, usually 1/2 tab a day.  She ran out of medicine and in meantime her back pain has increased.  No new sx, but continued midback pain at baseline.    AF, no CP.  No inc in SOB.  No BLE edema.  Off BB in meantime.  No heart racing.  She would like second opinion through cards.    She needs and parking placard and this was done, given to pt's son.   Vision loss- macular degeneration per eye clinic.  She is working with her son to improve her home situation, ie modifications for low vision.    ROS: See HPI, otherwise noncontributory.  Meds, vitals, and allergies reviewed.   nad ncat Mmm Neck supple IRR with murmur noted.  Not tachy ctab abd soft, not ttp Ext w/o edema.

## 2012-12-28 NOTE — Assessment & Plan Note (Signed)
She didn't have ADE on 2.5mg  of ambien and had trouble sleeping off the medicine.  I restarted it today.  This was a 45 min face to face pt encounter.

## 2013-01-05 ENCOUNTER — Encounter (INDEPENDENT_AMBULATORY_CARE_PROVIDER_SITE_OTHER): Payer: Medicare Other | Admitting: Ophthalmology

## 2013-01-05 DIAGNOSIS — H353290 Exudative age-related macular degeneration, unspecified eye, stage unspecified: Secondary | ICD-10-CM

## 2013-01-05 DIAGNOSIS — H35039 Hypertensive retinopathy, unspecified eye: Secondary | ICD-10-CM

## 2013-01-05 DIAGNOSIS — H3530 Unspecified macular degeneration: Secondary | ICD-10-CM

## 2013-01-05 DIAGNOSIS — I10 Essential (primary) hypertension: Secondary | ICD-10-CM

## 2013-01-10 ENCOUNTER — Ambulatory Visit (INDEPENDENT_AMBULATORY_CARE_PROVIDER_SITE_OTHER): Payer: Medicare Other | Admitting: Cardiology

## 2013-01-10 ENCOUNTER — Encounter: Payer: Self-pay | Admitting: Cardiology

## 2013-01-10 VITALS — BP 164/60 | HR 76 | Ht 60.0 in | Wt 131.8 lb

## 2013-01-10 DIAGNOSIS — I421 Obstructive hypertrophic cardiomyopathy: Secondary | ICD-10-CM

## 2013-01-10 DIAGNOSIS — I10 Essential (primary) hypertension: Secondary | ICD-10-CM

## 2013-01-10 DIAGNOSIS — I4891 Unspecified atrial fibrillation: Secondary | ICD-10-CM

## 2013-01-10 NOTE — Patient Instructions (Signed)
Take 50 mg of the losartan daily  Continue your other medication  I will see you again in 3 months.  Avoid caffeine

## 2013-01-10 NOTE — Progress Notes (Signed)
Cindy [name] Date of Birth: 03-Oct-1922 Medical Record #[id number]  History of Present Illness: Cindy [name] is an 79 year old white female that I am asked to see for second opinion concerning her atrial fibrillation. She has a history of paroxysmal atrial fibrillation. This has been present for at least 5 years. She reports that she has episodes 2 or 3 times a year that may last anywhere from 30 minutes to all day. When she is out of rhythm she feels weak and has a choking feeling. She denies any chest pain. She has no known history of coronary disease. She does carry a diagnosis of hypertrophic cardiomyopathy with her last echocardiogram showed moderate LVH and basal septal disproportionate hypertrophy. There was systolic anterior motion without significant outflow tract obstruction. She is currently on rate control with diltiazem. She has been intolerant of amiodarone and Norpace in the past. It is unclear whether she has been on beta blocker therapy previously. She is not a candidate for anticoagulation due to to history of falls and is on aspirin daily. She states that she just doesn't tolerate medication well. She has no history of dizziness or syncope. Her last hospitalization was in September of 2013 with atrophic fibrillation. It was at that time that she was tried on Norpace.  Current Outpatient Prescriptions on File Prior to Visit  Medication Sig Dispense Refill  . aspirin 81 MG chewable tablet Chew 162 mg by mouth daily.       Marland Kitchen diltiazem (CARDIZEM CD) 180 MG 24 hr capsule Take 1 capsule (180 mg total) by mouth daily.  90 capsule  3  . Docusate Sodium (DSS) 100 MG CAPS Take 100 mg by mouth 2 (two) times daily as needed.      . fish oil-omega-3 fatty acids 1000 MG capsule Take 1 g by mouth 2 (two) times daily.        . hydrochlorothiazide (HYDRODIURIL) 25 MG tablet Take 12.5 mg by mouth daily.      Marland Kitchen HYDROcodone-acetaminophen (NORCO/VICODIN) 5-325 MG per tablet Take 0.5 tablets by mouth 2  (two) times daily as needed for pain.  30 tablet  3  . Iron-Vitamins (GERITOL PO) Take 1 tablet by mouth daily.      Marland Kitchen levothyroxine (SYNTHROID, LEVOTHROID) 25 MCG tablet Take 25 mcg by mouth every morning.      Marland Kitchen losartan (COZAAR) 50 MG tablet Take 0.5 tablets (25 mg total) by mouth daily.      . Multiple Vitamins-Minerals (OCUVITE PRESERVISION) TABS Take 1 tablet by mouth daily.        . ranitidine (ZANTAC) 150 MG capsule Take 150 mg by mouth daily as needed. For heartburn       . rOPINIRole (REQUIP) 0.25 MG tablet Take 1 tablet (0.25 mg total) by mouth 3 (three) times daily as needed (for leg cramps ).  30 tablet  0  . zolpidem (AMBIEN) 5 MG tablet Take 0.5-1 tablets (2.5-5 mg total) by mouth at bedtime as needed. For sleep  30 tablet  3    Allergies  Allergen Reactions  . Irbesartan     REACTION: swelling  . Norpace (Disopyramide)     Dry mouth  . Ramipril     REACTION: lips swelling  . Telmisartan-Hctz     REACTION: incontinence  . Morphine And Related Other (See Comments)    "drives me crazy"    Past Medical History  Diagnosis Date  . Atrial fibrillation 04/23-24/2007    MCHC AFib, CT chest negative, CVTS/  P.E.; recurrent PAF with RVR in September 2013  . Hypertrophic cardiomyopathy   . Urinary incontinence   . Diverticulosis of colon (without mention of hemorrhage) 2003/ 08/2000    EGD/colonoscopy Barretts esophagus//H.H divertics 08/2000  . Cervical mass     C2 lateral mass fracture  . Hypertension   . Hypercholesterolemia     219/497  . Hypothyroidism   . Multinodular goiter (nontoxic)   . Osteoporosis   . Blood transfusion   . Jaundice ~ 1935    "in grade school"  . Degenerative joint disease     back  . Depression     "husband died Feb 14, 2011"  . Anemia, iron deficiency   . Personal history of colonic polyps 02/29/2012    tubular adenoma  . Barrett's esophagus   . Shortness of breath     Past Surgical History  Procedure Date  . Bladder surgery      bladder tack early 90's  . Tear duct probing 07/29/03    tear duck surg  . Cystourethroscopy 09/17/03  . Rotator cuff repair ? date; 09/07/05    left; right( Dr. Darrelyn Hillock)  . Appendectomy 1941  . Breast surgery 1981    breast reduction  . Eye surgery 03/2002    cataract OS  . Thyroid ultrasound 10/14/2003    MNG, no dominant masses  . Doppler echocardiography 03/05/2002&09/11/2003    ECHO, EF wnl, mild stenosis, A.S. mild MR, Mild T.R03/31/2003//ECHO EF 70%,LVH, ?diast dysfunction 09/11/2003  . Cataract extraction w/ intraocular lens  implant, bilateral 2003  . Dilation and curettage of uterus 09/07/2000    endometrial polyps removed, path all benign   . Tonsillectomy and adenoidectomy     "as a child"  . Fracture surgery 2010    right knee    History  Smoking status  . Former Smoker -- 0.5 packs/day for 4 years  . Types: Cigarettes  . Quit date: 07/06/1974  Smokeless tobacco  . Never Used    History  Alcohol Use No    Family History  Problem Relation Age of Onset  . Heart failure Mother     CHF, DM, HBP  . Hypertension Mother   . Uterine cancer Mother   . Stroke Mother   . Colon cancer Neg Hx   . Esophageal cancer Neg Hx   . Rectal cancer Neg Hx   . Stomach cancer Neg Hx     Review of Systems: The review of systems is positive for chronic fatigue. She reports her blood pressure has been running high in the afternoons. She increased her losartan dose in her blood pressure readings have been improved and she feels better.  All other systems were reviewed and are negative.  Physical Exam: BP 164/60  Pulse 76  Ht 5' (1.524 m)  Wt 131 lb 12.8 oz (59.784 kg)  BMI 25.74 kg/m2 She is an elderly, anxious white female in no acute distress. HEENT: Normocephalic, atraumatic. Pupils are equal round and reactive to light accommodation. Oropharynx clear. Neck is without jugular venous distention or bruits. There is no adenopathy or thyromegaly. Lungs are  clear. Cardiovascular: Regular rate and rhythm. Grade 2/6 harsh systolic murmur the right upper sternal border. There is no S3. Abdomen: Soft and nontender. No masses or bruits. Extremities: No cyanosis or edema. Pedal pulses are 2+ and symmetric. Neuro: Alert and oriented x3. Cranial nerves II through XII are intact. Mood is anxious. LABORATORY DATA: Lab Results  Component Value Date   WBC 6.1 08/29/2012  HGB 13.7 08/29/2012   HCT 40.2 08/29/2012   PLT 206 08/29/2012   GLUCOSE 96 09/02/2012   CHOL 160 08/30/2012   TRIG 77 08/30/2012   HDL 62 08/30/2012   LDLDIRECT 132.8 08/16/2007   LDLCALC 83 08/30/2012   ALT 11 08/29/2012   AST 18 08/29/2012   NA 136 09/02/2012   K 3.9 09/02/2012   CL 102 09/02/2012   CREATININE 0.69 09/02/2012   BUN 13 09/02/2012   CO2 25 09/02/2012   TSH 1.293 08/30/2012   INR 0.96 08/29/2012   MICROALBUR 0.4 02/11/2010     Assessment / Plan: 1. Paroxysmal atrial fibrillation. Based on her drug intolerances I think that she is on appropriate medication at this time. She is on rate control with diltiazem. If additional rate control as needed we could increase her diltiazem dose further or add a beta blocker. She has failed to antiarrhythmic drugs due to intolerance. She has always reverted back to sinus rhythm within 24 hours. Her episodes of atrial fib are infrequent. If she were to have more frequent or sustained episodes of atrial fibrillation I think Tikosyn would be a reasonable option. Her QT interval is normal in sinus rhythm. She has normal renal function. At this point however I do not see any reason to initiate Tikosyn therapy unless she were to demonstrate more frequent or sustained episodes.  2. Hypertension. I agree with increasing her losartan dose. Brother medications will remain the same.  3. Hypertrophic cardiomyopathy.

## 2013-02-02 ENCOUNTER — Encounter (INDEPENDENT_AMBULATORY_CARE_PROVIDER_SITE_OTHER): Payer: Medicare Other | Admitting: Ophthalmology

## 2013-02-02 DIAGNOSIS — I10 Essential (primary) hypertension: Secondary | ICD-10-CM

## 2013-02-02 DIAGNOSIS — H43819 Vitreous degeneration, unspecified eye: Secondary | ICD-10-CM

## 2013-02-02 DIAGNOSIS — H35039 Hypertensive retinopathy, unspecified eye: Secondary | ICD-10-CM

## 2013-02-02 DIAGNOSIS — H3530 Unspecified macular degeneration: Secondary | ICD-10-CM

## 2013-02-23 ENCOUNTER — Encounter: Payer: Self-pay | Admitting: Family Medicine

## 2013-02-23 ENCOUNTER — Ambulatory Visit (INDEPENDENT_AMBULATORY_CARE_PROVIDER_SITE_OTHER): Payer: Medicare Other | Admitting: Family Medicine

## 2013-02-23 ENCOUNTER — Telehealth: Payer: Self-pay

## 2013-02-23 VITALS — BP 142/58 | HR 72 | Temp 97.6°F | Wt 132.0 lb

## 2013-02-23 DIAGNOSIS — R5383 Other fatigue: Secondary | ICD-10-CM

## 2013-02-23 DIAGNOSIS — M1990 Unspecified osteoarthritis, unspecified site: Secondary | ICD-10-CM

## 2013-02-23 DIAGNOSIS — R5381 Other malaise: Secondary | ICD-10-CM

## 2013-02-23 LAB — CBC WITH DIFFERENTIAL/PLATELET
Basophils Absolute: 0 10*3/uL (ref 0.0–0.1)
Eosinophils Relative: 1.6 % (ref 0.0–5.0)
HCT: 38.3 % (ref 36.0–46.0)
Hemoglobin: 12.8 g/dL (ref 12.0–15.0)
Lymphocytes Relative: 17.7 % (ref 12.0–46.0)
Lymphs Abs: 1.4 10*3/uL (ref 0.7–4.0)
Monocytes Relative: 8.8 % (ref 3.0–12.0)
Platelets: 268 10*3/uL (ref 150.0–400.0)
RDW: 13.8 % (ref 11.5–14.6)
WBC: 8 10*3/uL (ref 4.5–10.5)

## 2013-02-23 LAB — TSH: TSH: 1.29 u[IU]/mL (ref 0.35–5.50)

## 2013-02-23 MED ORDER — HYDROCODONE-ACETAMINOPHEN 5-325 MG PO TABS: 0.5000 | ORAL_TABLET | Freq: Two times a day (BID) | ORAL | Status: DC | PRN

## 2013-02-23 NOTE — Patient Instructions (Addendum)
Go to the lab on the way out.  We'll contact you with your lab report.  Take a whole hydrocodone at night.  See if that helps with the pain at night and see if you sleep better.  Don't take the [place] for now.  If you have any troubles or concerns with the losartan, then let me know next week.   Take care.

## 2013-02-23 NOTE — Telephone Encounter (Signed)
pts son, Kathlene November left v/m that pt was seen today and Kathlene November was to call back with BP medication. Pt taking Losartan 50 mg; pt taking one tablet daily.

## 2013-02-23 NOTE — Progress Notes (Signed)
Here today with her son.    Frequent HA.  Started at least a month ago.  Pain at the occiput.  Occ frontal pain, but less severe and often than the occiput.  She doesn't sleep as well and she associates this with the change to generic ambien.    She complains of back pain.  She has been seen by ortho prev and was prev xrayed. Known thoracic spondylosis, especially in the lower thoracic spine,  with associated kyphosis and mild thoracolumbar scoliosis.  She takes half a vicodin for the back pain with some relief.  She can't sleep on her back.  Back pain is gradually worse, not suddenly worse.  No falls recently.  She is tired but she isn't sleeping well.   She notes dream changes only with the change to generic ambien.    Meds, vitals, and allergies reviewed.   ROS: See HPI.  Otherwise, noncontributory.  nad ncat PERRL eomi IRR not tachy ctab Upper C spine paraspinal muscles ttp w/o midline pain Normal ROM at the neck Back w/o midline pain Motor wnl x4

## 2013-02-25 NOTE — Assessment & Plan Note (Addendum)
>  25 min spent with face to face with patient, >50% counseling and/or coordinating care We need to work on thing at a time.  Will inc the hydrocodone to 1 pill at night to help with the pain, likely from DDD and causing B occipital pain.  This is likely the safest option, ie not a new med (gabapentin or nsaid).  We can address the ambien later.  It was unclear if she had a cough from the [facility] and I've asked them to check her med list at home.

## 2013-02-25 NOTE — Telephone Encounter (Signed)
That is noted.  The next question is this- is she having symptoms/ADE/side effects that she specifically attributes to this medicine?

## 2013-02-26 ENCOUNTER — Encounter: Payer: Self-pay | Admitting: *Deleted

## 2013-02-26 NOTE — Telephone Encounter (Signed)
No, not having any problems at this time with the medication.

## 2013-02-26 NOTE — Telephone Encounter (Signed)
Noted, thanks!

## 2013-03-02 ENCOUNTER — Other Ambulatory Visit (INDEPENDENT_AMBULATORY_CARE_PROVIDER_SITE_OTHER): Payer: Medicare Other

## 2013-03-02 DIAGNOSIS — D649 Anemia, unspecified: Secondary | ICD-10-CM

## 2013-03-16 ENCOUNTER — Encounter (INDEPENDENT_AMBULATORY_CARE_PROVIDER_SITE_OTHER): Payer: Medicare Other | Admitting: Ophthalmology

## 2013-03-16 DIAGNOSIS — I10 Essential (primary) hypertension: Secondary | ICD-10-CM

## 2013-03-16 DIAGNOSIS — H35039 Hypertensive retinopathy, unspecified eye: Secondary | ICD-10-CM

## 2013-03-16 DIAGNOSIS — H3530 Unspecified macular degeneration: Secondary | ICD-10-CM

## 2013-03-16 DIAGNOSIS — H43819 Vitreous degeneration, unspecified eye: Secondary | ICD-10-CM

## 2013-03-26 ENCOUNTER — Emergency Department (HOSPITAL_COMMUNITY): Payer: Medicare Other

## 2013-03-26 ENCOUNTER — Encounter (HOSPITAL_COMMUNITY): Payer: Self-pay | Admitting: Unknown Physician Specialty

## 2013-03-26 ENCOUNTER — Inpatient Hospital Stay (HOSPITAL_COMMUNITY)
Admission: EM | Admit: 2013-03-26 | Discharge: 2013-03-29 | DRG: 281 | Disposition: A | Discharge status: Home / Self Care | Payer: Medicare Other | Attending: Cardiology | Admitting: Cardiology

## 2013-03-26 DIAGNOSIS — I4891 Unspecified atrial fibrillation: Secondary | ICD-10-CM | POA: Diagnosis present

## 2013-03-26 DIAGNOSIS — Z860100 Personal history of colon polyps, unspecified: Secondary | ICD-10-CM

## 2013-03-26 DIAGNOSIS — I421 Obstructive hypertrophic cardiomyopathy: Secondary | ICD-10-CM | POA: Diagnosis present

## 2013-03-26 DIAGNOSIS — Z9849 Cataract extraction status, unspecified eye: Secondary | ICD-10-CM

## 2013-03-26 DIAGNOSIS — Z961 Presence of intraocular lens: Secondary | ICD-10-CM

## 2013-03-26 DIAGNOSIS — Z888 Allergy status to other drugs, medicaments and biological substances status: Secondary | ICD-10-CM

## 2013-03-26 DIAGNOSIS — E7800 Pure hypercholesterolemia, unspecified: Secondary | ICD-10-CM | POA: Diagnosis present

## 2013-03-26 DIAGNOSIS — F329 Major depressive disorder, single episode, unspecified: Secondary | ICD-10-CM | POA: Diagnosis present

## 2013-03-26 DIAGNOSIS — R7989 Other specified abnormal findings of blood chemistry: Secondary | ICD-10-CM

## 2013-03-26 DIAGNOSIS — I059 Rheumatic mitral valve disease, unspecified: Secondary | ICD-10-CM | POA: Diagnosis present

## 2013-03-26 DIAGNOSIS — Z79899 Other long term (current) drug therapy: Secondary | ICD-10-CM

## 2013-03-26 DIAGNOSIS — I214 Non-ST elevation (NSTEMI) myocardial infarction: Principal | ICD-10-CM | POA: Diagnosis present

## 2013-03-26 DIAGNOSIS — Z8049 Family history of malignant neoplasm of other genital organs: Secondary | ICD-10-CM

## 2013-03-26 DIAGNOSIS — Z8249 Family history of ischemic heart disease and other diseases of the circulatory system: Secondary | ICD-10-CM

## 2013-03-26 DIAGNOSIS — Z7982 Long term (current) use of aspirin: Secondary | ICD-10-CM

## 2013-03-26 DIAGNOSIS — E039 Hypothyroidism, unspecified: Secondary | ICD-10-CM | POA: Diagnosis present

## 2013-03-26 DIAGNOSIS — I2510 Atherosclerotic heart disease of native coronary artery without angina pectoris: Secondary | ICD-10-CM | POA: Diagnosis present

## 2013-03-26 DIAGNOSIS — E042 Nontoxic multinodular goiter: Secondary | ICD-10-CM | POA: Diagnosis present

## 2013-03-26 DIAGNOSIS — I10 Essential (primary) hypertension: Secondary | ICD-10-CM | POA: Diagnosis present

## 2013-03-26 DIAGNOSIS — D509 Iron deficiency anemia, unspecified: Secondary | ICD-10-CM | POA: Diagnosis present

## 2013-03-26 DIAGNOSIS — K2270 Barrett's esophagus without dysplasia: Secondary | ICD-10-CM | POA: Diagnosis present

## 2013-03-26 DIAGNOSIS — Z833 Family history of diabetes mellitus: Secondary | ICD-10-CM

## 2013-03-26 DIAGNOSIS — K5730 Diverticulosis of large intestine without perforation or abscess without bleeding: Secondary | ICD-10-CM | POA: Diagnosis present

## 2013-03-26 DIAGNOSIS — Z9181 History of falling: Secondary | ICD-10-CM

## 2013-03-26 DIAGNOSIS — M810 Age-related osteoporosis without current pathological fracture: Secondary | ICD-10-CM | POA: Diagnosis present

## 2013-03-26 DIAGNOSIS — R32 Unspecified urinary incontinence: Secondary | ICD-10-CM | POA: Diagnosis present

## 2013-03-26 DIAGNOSIS — Z87891 Personal history of nicotine dependence: Secondary | ICD-10-CM

## 2013-03-26 DIAGNOSIS — F3289 Other specified depressive episodes: Secondary | ICD-10-CM | POA: Diagnosis present

## 2013-03-26 DIAGNOSIS — Z823 Family history of stroke: Secondary | ICD-10-CM

## 2013-03-26 HISTORY — DX: Atherosclerotic heart disease of native coronary artery without angina pectoris: I25.10

## 2013-03-26 HISTORY — DX: Nonrheumatic aortic (valve) stenosis: I35.0

## 2013-03-26 HISTORY — DX: Nonrheumatic mitral (valve) insufficiency: I34.0

## 2013-03-26 LAB — CBC WITH DIFFERENTIAL/PLATELET
Basophils Absolute: 0 10*3/uL (ref 0.0–0.1)
Basophils Relative: 0 % (ref 0–1)
HCT: 37.7 % (ref 36.0–46.0)
MCHC: 33.7 g/dL (ref 30.0–36.0)
Monocytes Absolute: 0.7 10*3/uL (ref 0.1–1.0)
Neutro Abs: 3.9 10*3/uL (ref 1.7–7.7)
Platelets: 214 10*3/uL (ref 150–400)
RDW: 13.3 % (ref 11.5–15.5)
WBC: 6.1 10*3/uL (ref 4.0–10.5)

## 2013-03-26 LAB — BASIC METABOLIC PANEL
BUN: 14 mg/dL (ref 6–23)
Creatinine, Ser: 0.8 mg/dL (ref 0.50–1.10)
GFR calc Af Amer: 74 mL/min — ABNORMAL LOW (ref >90)
GFR calc non Af Amer: 64 mL/min — ABNORMAL LOW (ref >90)
Potassium: 3.9 mEq/L (ref 3.5–5.1)

## 2013-03-26 LAB — POCT I-STAT, CHEM 8
Chloride: 102 mEq/L (ref 96–112)
HCT: 39 % (ref 36.0–46.0)
Hemoglobin: 13.3 g/dL (ref 12.0–15.0)
Potassium: 3.9 mEq/L (ref 3.5–5.1)
Sodium: 136 mEq/L (ref 135–145)

## 2013-03-26 LAB — POCT I-STAT TROPONIN I: Troponin i, poc: 0.75 ng/mL (ref 0.00–0.08)

## 2013-03-26 LAB — PROTIME-INR: Prothrombin Time: 12.3 seconds (ref 11.6–15.2)

## 2013-03-26 LAB — TROPONIN I: Troponin I: 1.67 ng/mL (ref <0.30)

## 2013-03-26 MED ORDER — HEPARIN BOLUS VIA INFUSION
3000.0000 [IU] | Freq: Once | INTRAVENOUS | Status: AC
  Administered 2013-03-26: 3000 [IU] via INTRAVENOUS

## 2013-03-26 MED ORDER — ASPIRIN 81 MG PO CHEW
324.0000 mg | CHEWABLE_TABLET | Freq: Once | ORAL | Status: AC
Start: 2013-03-26 — End: 2013-03-26
  Administered 2013-03-26: 162 mg via ORAL
  Filled 2013-03-26: qty 4

## 2013-03-26 MED ORDER — HEPARIN (PORCINE) IN NACL 100-0.45 UNIT/ML-% IJ SOLN
900.0000 [IU]/h | INTRAMUSCULAR | Status: DC
  Administered 2013-03-26: 900 [IU]/h via INTRAVENOUS
  Filled 2013-03-26 (×2): qty 250

## 2013-03-26 NOTE — ED Notes (Signed)
Cardiologist at bedside.  

## 2013-03-26 NOTE — ED Provider Notes (Signed)
History     CSN: [id number]  Arrival date & time 03/26/13  2118   First MD Initiated Contact with Patient 03/26/13 2123      Chief Complaint  Patient presents with  . Atrial Fibrillation    (Consider location/radiation/quality/duration/timing/severity/associated sxs/prior treatment) HPI Comments: Patient comes to the ER for evaluation of heart palpitations. Patient reports that she has a history of paroxysmal atrial fibrillation. She reports that she had onset of irregular fast heartbeat this evening. Patient states that she felt like he had indigestion. She had a tightness and discomfort in the Center of her chest. She was feeling some shortness of breath and nausea with the symptoms. This is typical for her atrial fibrillation. Patient reports that usually she can wait it out and it resolves in less than an hour. Tonight lasted longer than usual so she called EMS. EMS documented atrial fibrillation in the 130 to 140 range. Upon arrival to the ER, however, she converted to sinus rhythm. All symptoms have now resolved.  Patient is a 79 y.o. female presenting with atrial fibrillation.  Atrial Fibrillation Associated symptoms include shortness of breath.    Past Medical History  Diagnosis Date  . Atrial fibrillation 04/23-24/2007    [facility] AFib, CT chest negative, CVTS/ P.E.; recurrent PAF with RVR in September 2013  . Hypertrophic cardiomyopathy   . Urinary incontinence   . Diverticulosis of colon (without mention of hemorrhage) 2003/ 08/2000    EGD/colonoscopy Barretts esophagus//H.H divertics 08/2000  . Cervical mass     C2 lateral mass fracture  . Hypertension   . Hypercholesterolemia     219/497  . Hypothyroidism   . Multinodular goiter (nontoxic)   . Osteoporosis   . Blood transfusion   . Jaundice ~ 1935    "in grade school"  . Degenerative joint disease     back  . Depression     "husband died February 24, 2011"  . Anemia, iron deficiency   . Personal history of colonic polyps  02/29/2012    tubular adenoma  . Barrett's esophagus   . Shortness of breath     Past Surgical History  Procedure Laterality Date  . Bladder surgery      bladder tack early 90's  . Tear duct probing  07/29/03    tear duck surg  . Cystourethroscopy  09/17/03  . Rotator cuff repair  ? date; 09/07/05    left; right( Dr. Darrelyn Hillock)  . Appendectomy  1941  . Breast surgery  1981    breast reduction  . Eye surgery  03/2002    cataract OS  . Thyroid ultrasound  10/14/2003    MNG, no dominant masses  . Doppler echocardiography  03/05/2002&09/11/2003    ECHO, EF wnl, mild stenosis, A.S. mild MR, Mild T.R03/31/2003//ECHO EF 70%,LVH, ?diast dysfunction 09/11/2003  . Cataract extraction w/ intraocular lens  implant, bilateral  2003  . Dilation and curettage of uterus  09/07/2000    endometrial polyps removed, path all benign   . Tonsillectomy and adenoidectomy      "as a child"  . Fracture surgery  2010    right knee    Family History  Problem Relation Age of Onset  . Heart failure Mother     CHF, DM, HBP  . Hypertension Mother   . Uterine cancer Mother   . Stroke Mother   . Colon cancer Neg Hx   . Esophageal cancer Neg Hx   . Rectal cancer Neg Hx   . Stomach cancer Neg  Hx     History  Substance Use Topics  . Smoking status: Former Smoker -- 0.50 packs/day for 4 years    Types: Cigarettes    Quit date: 07/06/1974  . Smokeless tobacco: Never Used  . Alcohol Use: No    OB History   Grav Para Term Preterm Abortions TAB SAB Ect Mult Living                  Review of Systems  Respiratory: Positive for shortness of breath.   Cardiovascular: Positive for palpitations.  Gastrointestinal: Positive for nausea.  All other systems reviewed and are negative.    Allergies  Irbesartan; Norpace; Ramipril; Telmisartan-hctz; and Morphine and related  Home Medications   Current Outpatient Rx  Name  Route  Sig  Dispense  Refill  . aspirin 81 MG chewable tablet   Oral   Chew  162 mg by mouth daily.          Marland Kitchen BESIVANCE 0.6 % SUSP               . diltiazem (CARDIZEM CD) 180 MG 24 hr capsule   Oral   Take 1 capsule (180 mg total) by mouth daily.   90 capsule   3   . Docusate Sodium (DSS) 100 MG CAPS   Oral   Take 100 mg by mouth 2 (two) times daily as needed.         . fish oil-omega-3 fatty acids 1000 MG capsule   Oral   Take 1 g by mouth 2 (two) times daily.           . hydrochlorothiazide (HYDRODIURIL) 25 MG tablet   Oral   Take 12.5 mg by mouth daily.         Marland Kitchen HYDROcodone-acetaminophen (NORCO/VICODIN) 5-325 MG per tablet   Oral   Take 0.5-1 tablets by mouth 2 (two) times daily as needed for pain.         . Iron-Vitamins (GERITOL PO)   Oral   Take 1 tablet by mouth daily.         Marland Kitchen levothyroxine (SYNTHROID, LEVOTHROID) 25 MCG tablet   Oral   Take 25 mcg by mouth every morning.         Marland Kitchen losartan (COZAAR) 50 MG tablet   Oral   Take 0.5 tablets (25 mg total) by mouth daily.         . Multiple Vitamins-Minerals (OCUVITE PRESERVISION) TABS   Oral   Take 1 tablet by mouth daily.           . ranitidine (ZANTAC) 150 MG capsule   Oral   Take 150 mg by mouth daily as needed. For heartburn          . EXPIRED: rOPINIRole (REQUIP) 0.25 MG tablet   Oral   Take 1 tablet (0.25 mg total) by mouth 3 (three) times daily as needed (for leg cramps ).   30 tablet   0   . zolpidem (AMBIEN) 5 MG tablet   Oral   Take 0.5-1 tablets (2.5-5 mg total) by mouth at bedtime as needed. For sleep   30 tablet   3     BP 144/77  Temp(Src) 97.9 F (36.6 C) (Oral)  Ht 4\' 11"  (1.499 m)  Wt 141 lb (63.957 kg)  BMI 28.46 kg/m2  SpO2 98%  Physical Exam  Constitutional: She is oriented to person, place, and time. She appears well-developed and well-nourished. No distress.  HENT:  Head:  Normocephalic and atraumatic.  Right Ear: Hearing normal.  Nose: Nose normal.  Mouth/Throat: Oropharynx is clear and moist and mucous membranes  are normal.  Eyes: Conjunctivae and EOM are normal. Pupils are equal, round, and reactive to light.  Neck: Normal range of motion. Neck supple.  Cardiovascular: Normal rate, regular rhythm, S1 normal and S2 normal.  Exam reveals no gallop and no friction rub.   No murmur heard. Pulmonary/Chest: Effort normal and breath sounds normal. No respiratory distress. She exhibits no tenderness.  Abdominal: Soft. Normal appearance and bowel sounds are normal. There is no hepatosplenomegaly. There is no tenderness. There is no rebound, no guarding, no tenderness at McBurney's point and negative Murphy's sign. No hernia.  Musculoskeletal: Normal range of motion.  Neurological: She is alert and oriented to person, place, and time. She has normal strength. No cranial nerve deficit or sensory deficit. Coordination normal. GCS eye subscore is 4. GCS verbal subscore is 5. GCS motor subscore is 6.  Skin: Skin is warm, dry and intact. No rash noted. No cyanosis.  Psychiatric: She has a normal mood and affect. Her speech is normal and behavior is normal. Thought content normal.    ED Course  Procedures (including critical care time)  Labs Reviewed  TROPONIN I - Abnormal; Notable for the following:    Troponin I 1.67 (*)    All other components within normal limits  BASIC METABOLIC PANEL - Abnormal; Notable for the following:    GFR calc non Af Amer 64 (*)    GFR calc Af Amer 74 (*)    All other components within normal limits  POCT I-STAT, CHEM 8 - Abnormal; Notable for the following:    Calcium, Ion 1.11 (*)    All other components within normal limits  POCT I-STAT TROPONIN I - Abnormal; Notable for the following:    Troponin i, poc 0.75 (*)    All other components within normal limits  CBC WITH DIFFERENTIAL   Dg Chest Port 1 View  03/26/2013  *RADIOLOGY REPORT*  Clinical Data: Shortness of breath  PORTABLE CHEST - 1 VIEW  Comparison: 08/29/2012  Findings: Prominent cardiac contour.  Retrocardiac mass  is most in keeping with a hiatal hernia.  Aortic atherosclerosis.  Central vascular congestion.  Mild interstitial prominence.  No confluent airspace opacities, large pleural effusion, or pneumothorax. Osteopenia, otherwise nondiagnostic osseous evaluation due to technique.  IMPRESSION: Cardiomegaly with central vascular congestion. Mild interstitial prominence may be chronic or interstitial edema.  Large hiatal hernia.   Original Report Authenticated By: Jearld Lesch, M.D.      Diagnosis: 1. Atrial fibrillation with rapid ventricular response 2. Elevated troponin, secondary to atrial fibrillation versus non-ST elevation MI    MDM  Patient presents to the ER for evaluation of chest discomfort associated with an episode of atrial fibrillation with rapid ventricular response. Patient has had paroxysmal atrial fibrillation in the past. She reports that she has been having more frequent episodes lately. Usually they only last for a few minutes, but tonight's episode lasted more than an hour before she called EMS. She spontaneously converted to sinus rhythm upon arrival to the ER. She initially indicated that her chest discomfort was resolved. EKG after conversion reveals subtle depressions in V5 and V6, otherwise no acute findings. Repeat examination the patient reveals that she now says that she is feeling some slight discomfort in the Center of her chest. Case discussed with Dr. Charm Barges, on-call for [facility] cardiology. He recommends initiating heparin and  will see the patient in the ER.        Gilda Crease, MD 03/26/13 [phone]

## 2013-03-26 NOTE — ED Notes (Signed)
Patient arrived via [facility] with afib RVR. Patient was having heartburn, shortness of breath and nausea. Denies any diaphoresis. During transport patient converted to NSR without any intervention needed.

## 2013-03-26 NOTE — H&P (Signed)
Cardiology H&P  Primary Care Povider: Crawford Givens, MD Primary Cardiologist: Ronnette Juniper   HPI: Cindy [name] is a 79 y.o.female with paroxysmal atrial fibrillation and LVH who presents tonight with tachypalpitations and chest pain.  She was seen by Dr. [place] for itnermittent afib a few months ago.  She has had 2-3 episodes in the last week all lasting longer than normal.  Tonight she developed chest pressure with an episode that lasted longer than 1 hour.  She reportedly was at 150bpm with EMS and EKG showed lateral ST depression but I have been unable to locate these strips.  She converted to NSR before reaching the ED and is currently feeling better.  Last year she was on amiodarone but did not tolerate it due to headaches and "interaction with my other meds".  Dr. [place] suggested Tikosyn should her symptoms worsen.  She is here with her son but she lives alone normally.  She is not currently on coumadin due to her age and concerns for falling.  It does not appear that she has had any chest discomfort at any other time when she is not in afib.     Past Medical History  Diagnosis Date  . Atrial fibrillation 04/23-24/2007    [facility] AFib, CT chest negative, CVTS/ P.E.; recurrent PAF with RVR in September 2013  . Hypertrophic cardiomyopathy   . Urinary incontinence   . Diverticulosis of colon (without mention of hemorrhage) 2003/ 08/2000    EGD/colonoscopy Barretts esophagus//H.H divertics 08/2000  . Cervical mass     C2 lateral mass fracture  . Hypertension   . Hypercholesterolemia     219/497  . Hypothyroidism   . Multinodular goiter (nontoxic)   . Osteoporosis   . Blood transfusion   . Jaundice ~ 1935    "in grade school"  . Degenerative joint disease     back  . Depression     "husband died 02-07-11"  . Anemia, iron deficiency   . Personal history of colonic polyps 02/29/2012    tubular adenoma  . Barrett's esophagus   . Shortness of breath     Past Surgical History   Procedure Laterality Date  . Bladder surgery      bladder tack early 90's  . Tear duct probing  07/29/03    tear duck surg  . Cystourethroscopy  09/17/03  . Rotator cuff repair  ? date; 09/07/05    left; right( Dr. Darrelyn Hillock)  . Appendectomy  1941  . Breast surgery  1981    breast reduction  . Eye surgery  03/2002    cataract OS  . Thyroid ultrasound  10/14/2003    MNG, no dominant masses  . Doppler echocardiography  03/05/2002&09/11/2003    ECHO, EF wnl, mild stenosis, A.S. mild MR, Mild T.R03/31/2003//ECHO EF 70%,LVH, ?diast dysfunction 09/11/2003  . Cataract extraction w/ intraocular lens  implant, bilateral  2003  . Dilation and curettage of uterus  09/07/2000    endometrial polyps removed, path all benign   . Tonsillectomy and adenoidectomy      "as a child"  . Fracture surgery  2010    right knee    Family History  Problem Relation Age of Onset  . Heart failure Mother     CHF, DM, HBP  . Hypertension Mother   . Uterine cancer Mother   . Stroke Mother   . Colon cancer Neg Hx   . Esophageal cancer Neg Hx   . Rectal cancer Neg Hx   . Stomach  cancer Neg Hx     Social History:  reports that she quit smoking about 38 years ago. Her smoking use included Cigarettes. She has a 2 pack-year smoking history. She has never used smokeless tobacco. She reports that she does not drink alcohol or use illicit drugs.  Allergies:  Allergies  Allergen Reactions  . Irbesartan     REACTION: swelling  . Norpace (Disopyramide)     Dry mouth  . Ramipril     REACTION: lips swelling  . Telmisartan-Hctz     REACTION: incontinence  . Morphine And Related Other (See Comments)    "drives me crazy"    Current Facility-Administered Medications  Medication Dose Route Frequency Provider Last Rate Last Dose  . heparin ADULT infusion 100 units/mL (25000 units/250 mL)  900 Units/hr Intravenous Continuous Gilda Crease, MD 9 mL/hr at 03/26/13 2349 900 Units/hr at 03/26/13 2349    Current Outpatient Prescriptions  Medication Sig Dispense Refill  . aspirin 81 MG chewable tablet Chew 162 mg by mouth daily.       Marland Kitchen BESIVANCE 0.6 % SUSP Place 1 drop into both eyes 2 (two) times daily.       Marland Kitchen diltiazem (CARDIZEM CD) 180 MG 24 hr capsule Take 1 capsule (180 mg total) by mouth daily.  90 capsule  3  . Docusate Sodium (DSS) 100 MG CAPS Take 100 mg by mouth 2 (two) times daily as needed (for constipation).       . fish oil-omega-3 fatty acids 1000 MG capsule Take 1 g by mouth 2 (two) times daily.        . hydrochlorothiazide (HYDRODIURIL) 25 MG tablet Take 12.5 mg by mouth daily.      Marland Kitchen HYDROcodone-acetaminophen (NORCO/VICODIN) 5-325 MG per tablet Take 0.5-1 tablets by mouth 2 (two) times daily as needed for pain.      . Iron-Vitamins (GERITOL PO) Take 1 tablet by mouth daily.      Marland Kitchen levothyroxine (SYNTHROID, LEVOTHROID) 25 MCG tablet Take 25 mcg by mouth every morning.      Marland Kitchen losartan (COZAAR) 50 MG tablet Take 0.5 tablets (25 mg total) by mouth daily.      . Multiple Vitamins-Minerals (OCUVITE PRESERVISION) TABS Take 1 tablet by mouth daily.          ROS: A full review of systems is obtained and is negative except as noted in the HPI.  Physical Exam: Blood pressure 153/45, pulse 66, temperature 97.9 F (36.6 C), temperature source Oral, resp. rate 16, height 4\' 11"  (1.499 m), weight 63.957 kg (141 lb), SpO2 99.00%.  GENERAL: no acute distress.  EYES: Extra ocular movements are intact. There is no lid lag. Sclera is anicteric.  ENT: Oropharynx is clear.  NECK: Supple. The thyroid is not enlarged.  LYMPH: There are no masses or lymphadenopathy present.  HEART: Regular rate and rhythm with 2/6 SEM at RUSB and 2/6 holosystolic murmur at apex, no JVD LUNGS: Clear to auscultation There are no rales, rhonchi, or wheezes.  ABDOMEN: Soft, non-tender, and non-distended with normoactive bowel sounds. There is no hepatosplenomegaly.  EXTREMITIES: No clubbing, cyanosis, or edema.   PULSES: Femoral pulses were +2 and equal bilaterally. DP/PT pulses were +2 and equal bilaterally.  SKIN: Warm, dry, and intact.  NEUROLOGIC: The patient was oriented to person, place, and time. No overt neurologic deficits were detected.  PSYCH: Normal judgment and insight, mood is appropriate.   Results: Results for orders placed during the hospital encounter of 03/26/13 (from the  past 24 hour(s))  POCT I-STAT TROPONIN I     Status: Abnormal   Collection Time    03/26/13  9:54 PM      Result Value Range   Troponin i, poc 0.75 (*) 0.00 - 0.08 ng/mL   Comment NOTIFIED PHYSICIAN     Comment 3           POCT I-STAT, CHEM 8     Status: Abnormal   Collection Time    03/26/13  9:57 PM      Result Value Range   Sodium 136  135 - 145 mEq/L   Potassium 3.9  3.5 - 5.1 mEq/L   Chloride 102  96 - 112 mEq/L   BUN 14  6 - 23 mg/dL   Creatinine, Ser 9.56  0.50 - 1.10 mg/dL   Glucose, Bld 97  70 - 99 mg/dL   Calcium, Ion 2.13 (*) 1.13 - 1.30 mmol/L   TCO2 26  0 - 100 mmol/L   Hemoglobin 13.3  12.0 - 15.0 g/dL   HCT 08.6  57.8 - 46.9 %  CBC WITH DIFFERENTIAL     Status: None   Collection Time    03/26/13 10:05 PM      Result Value Range   WBC 6.1  4.0 - 10.5 K/uL   RBC 4.39  3.87 - 5.11 MIL/uL   Hemoglobin 12.7  12.0 - 15.0 g/dL   HCT 62.9  52.8 - 41.3 %   MCV 85.9  78.0 - 100.0 fL   MCH 28.9  26.0 - 34.0 pg   MCHC 33.7  30.0 - 36.0 g/dL   RDW 24.4  01.0 - 27.2 %   Platelets 214  150 - 400 K/uL   Neutrophils Relative 64  43 - 77 %   Neutro Abs 3.9  1.7 - 7.7 K/uL   Lymphocytes Relative 22  12 - 46 %   Lymphs Abs 1.4  0.7 - 4.0 K/uL   Monocytes Relative 12  3 - 12 %   Monocytes Absolute 0.7  0.1 - 1.0 K/uL   Eosinophils Relative 2  0 - 5 %   Eosinophils Absolute 0.1  0.0 - 0.7 K/uL   Basophils Relative 0  0 - 1 %   Basophils Absolute 0.0  0.0 - 0.1 K/uL  TROPONIN I     Status: Abnormal   Collection Time    03/26/13 10:05 PM      Result Value Range   Troponin I 1.67 (*) <0.30  ng/mL  BASIC METABOLIC PANEL     Status: Abnormal   Collection Time    03/26/13 10:05 PM      Result Value Range   Sodium 135  135 - 145 mEq/L   Potassium 3.9  3.5 - 5.1 mEq/L   Chloride 100  96 - 112 mEq/L   CO2 26  19 - 32 mEq/L   Glucose, Bld 96  70 - 99 mg/dL   BUN 14  6 - 23 mg/dL   Creatinine, Ser 5.36  0.50 - 1.10 mg/dL   Calcium 8.7  8.4 - 64.4 mg/dL   GFR calc non Af Amer 64 (*) >90 mL/min   GFR calc Af Amer 74 (*) >90 mL/min  PROTIME-INR     Status: None   Collection Time    03/26/13 11:20 PM      Result Value Range   Prothrombin Time 12.3  11.6 - 15.2 seconds   INR 0.92  0.00 - 1.49  EKG: NSR with LVH and new subtle twave changes laterally CXR: clear  Assessment/Plan: 1. Paroxysmal Atrial Fibrillation: symptomatic - given increased episodes, it might be reasonable to try tikosyn.  Particularly since she is not on chronic coumadin.  Will have Dr. [place] weigh in. - telemetry - continue diltiazem 2. Elevated cardiac enzymes: no prior history of obstructive disease, could be related to tachycardia but trop higher than expected.  Suspect may have obstructive disease and this is a type II MI. - will cautiously continue heparin for now - ASA daily - follow cardiac enzymes - check 2D echo in the AM 3. NPO after midnight   Cindy [name] 03/26/2013, 11:59 PM

## 2013-03-26 NOTE — Progress Notes (Signed)
ANTICOAGULATION CONSULT NOTE - Initial Consult  Pharmacy Consult for heparin Indication: atrial fibrillation  Allergies  Allergen Reactions  . Irbesartan     REACTION: swelling  . Norpace (Disopyramide)     Dry mouth  . Ramipril     REACTION: lips swelling  . Telmisartan-Hctz     REACTION: incontinence  . Morphine And Related Other (See Comments)    "drives me crazy"    Patient Measurements: Height: 4\' 11"  (149.9 cm) Weight: 141 lb (63.957 kg) IBW/kg (Calculated) : 43.2 Heparin Dosing Weight: 57 kg  Vital Signs: Temp: 97.9 F (36.6 C) (04/21 2120) Temp src: Oral (04/21 2120) BP: 153/45 mmHg (04/21 2300) Pulse Rate: 66 (04/21 2300)  Labs:  Recent Labs  03/26/13 2157 03/26/13 2205  HGB 13.3 12.7  HCT 39.0 37.7  PLT  --  214  CREATININE 0.80 0.80  TROPONINI  --  1.67*    Estimated Creatinine Clearance: 39.5 ml/min (by C-G formula based on Cr of 0.8).   Medical History: Past Medical History  Diagnosis Date  . Atrial fibrillation 04/23-24/2007    [facility] AFib, CT chest negative, CVTS/ P.E.; recurrent PAF with RVR in September 2013  . Hypertrophic cardiomyopathy   . Urinary incontinence   . Diverticulosis of colon (without mention of hemorrhage) 2003/ 08/2000    EGD/colonoscopy Barretts esophagus//H.H divertics 08/2000  . Cervical mass     C2 lateral mass fracture  . Hypertension   . Hypercholesterolemia     219/497  . Hypothyroidism   . Multinodular goiter (nontoxic)   . Osteoporosis   . Blood transfusion   . Jaundice ~ 1935    "in grade school"  . Degenerative joint disease     back  . Depression     "husband died February 06, 2011"  . Anemia, iron deficiency   . Personal history of colonic polyps 02/29/2012    tubular adenoma  . Barrett's esophagus   . Shortness of breath     Medications:  Scheduled:  . aspirin  324 mg Oral Once    Assessment: 79 yo female presented with atrial fibrillation with RVR. Pharmacy to manage IV heparin.   Goal of  Therapy:  Heparin level 0.3-0.7 units/ml Monitor platelets by anticoagulation protocol: Yes   Plan:  1. Heparin 3000 unit IV bolus x 1, then IV infusion of 900 units/hr.  2. Heparin level in 8 hours.  3. Daily CBC, heparin level   Emeline Gins 03/26/2013,11:30 PM

## 2013-03-27 ENCOUNTER — Other Ambulatory Visit: Payer: Self-pay | Admitting: Family Medicine

## 2013-03-27 ENCOUNTER — Encounter (HOSPITAL_COMMUNITY)
Admission: EM | Disposition: A | Discharge status: Home / Self Care | Payer: Self-pay | Source: Home / Self Care | Attending: Cardiology

## 2013-03-27 DIAGNOSIS — I214 Non-ST elevation (NSTEMI) myocardial infarction: Secondary | ICD-10-CM | POA: Diagnosis present

## 2013-03-27 DIAGNOSIS — I059 Rheumatic mitral valve disease, unspecified: Secondary | ICD-10-CM

## 2013-03-27 DIAGNOSIS — I4891 Unspecified atrial fibrillation: Secondary | ICD-10-CM

## 2013-03-27 DIAGNOSIS — I2510 Atherosclerotic heart disease of native coronary artery without angina pectoris: Secondary | ICD-10-CM

## 2013-03-27 HISTORY — PX: LEFT HEART CATHETERIZATION WITH CORONARY ANGIOGRAM: SHX5451

## 2013-03-27 LAB — CBC
HCT: 34.8 % — ABNORMAL LOW (ref 36.0–46.0)
HCT: 35.2 % — ABNORMAL LOW (ref 36.0–46.0)
Hemoglobin: 11.5 g/dL — ABNORMAL LOW (ref 12.0–15.0)
MCHC: 33.9 g/dL (ref 30.0–36.0)
MCHC: 32.7 g/dL (ref 30.0–36.0)
MCV: 85.5 fL (ref 78.0–100.0)
MCV: 87.1 fL (ref 78.0–100.0)
Platelets: 184 10*3/uL (ref 150–400)
RDW: 13.4 % (ref 11.5–15.5)
RDW: 13.4 % (ref 11.5–15.5)
WBC: 7.4 10*3/uL (ref 4.0–10.5)

## 2013-03-27 LAB — BASIC METABOLIC PANEL
BUN: 11 mg/dL (ref 6–23)
Creatinine, Ser: 0.72 mg/dL (ref 0.50–1.10)
GFR calc Af Amer: 86 mL/min — ABNORMAL LOW (ref >90)
GFR calc non Af Amer: 74 mL/min — ABNORMAL LOW (ref >90)
Potassium: 3.7 mEq/L (ref 3.5–5.1)

## 2013-03-27 LAB — GLUCOSE, CAPILLARY: Glucose-Capillary: 94 mg/dL (ref 70–99)

## 2013-03-27 LAB — POCT I-STAT TROPONIN I

## 2013-03-27 LAB — POCT ACTIVATED CLOTTING TIME: Activated Clotting Time: 160 seconds

## 2013-03-27 LAB — TROPONIN I
Troponin I: 12.3 ng/mL (ref <0.30)
Troponin I: 1.88 ng/mL (ref <0.30)

## 2013-03-27 SURGERY — LEFT HEART CATHETERIZATION WITH CORONARY ANGIOGRAM
Anesthesia: LOCAL

## 2013-03-27 MED ORDER — LEVOTHYROXINE SODIUM 25 MCG PO TABS
25.0000 ug | ORAL_TABLET | Freq: Every day | ORAL | Status: DC
  Administered 2013-03-27 – 2013-03-29 (×3): 25 ug via ORAL
  Filled 2013-03-27 (×4): qty 1

## 2013-03-27 MED ORDER — LIDOCAINE HCL (PF) 1 % IJ SOLN
INTRAMUSCULAR | Status: AC
  Filled 2013-03-27: qty 30

## 2013-03-27 MED ORDER — NITROGLYCERIN 0.4 MG SL SUBL: 0.4000 mg | SUBLINGUAL_TABLET | SUBLINGUAL | Status: DC | PRN

## 2013-03-27 MED ORDER — ASPIRIN 81 MG PO CHEW
324.0000 mg | CHEWABLE_TABLET | ORAL | Status: AC
  Administered 2013-03-27: 324 mg via ORAL
  Filled 2013-03-27: qty 4

## 2013-03-27 MED ORDER — HEPARIN (PORCINE) IN NACL 2-0.9 UNIT/ML-% IJ SOLN
INTRAMUSCULAR | Status: AC
  Filled 2013-03-27: qty 1000

## 2013-03-27 MED ORDER — ASPIRIN EC 81 MG PO TBEC
81.0000 mg | DELAYED_RELEASE_TABLET | Freq: Every day | ORAL | Status: DC
  Administered 2013-03-28 – 2013-03-29 (×2): 81 mg via ORAL
  Filled 2013-03-27 (×3): qty 1

## 2013-03-27 MED ORDER — ONDANSETRON HCL 4 MG/2ML IJ SOLN: 4.0000 mg | Freq: Four times a day (QID) | INTRAMUSCULAR | Status: DC | PRN

## 2013-03-27 MED ORDER — ACETAMINOPHEN 325 MG PO TABS
650.0000 mg | ORAL_TABLET | ORAL | Status: DC | PRN
  Administered 2013-03-28: 650 mg via ORAL
  Filled 2013-03-27: qty 2

## 2013-03-27 MED ORDER — BESIFLOXACIN HCL 0.6 % OP SUSP: 1.0000 [drp] | Freq: Two times a day (BID) | OPHTHALMIC | Status: DC

## 2013-03-27 MED ORDER — VERAPAMIL HCL 2.5 MG/ML IV SOLN
INTRAVENOUS | Status: AC
  Filled 2013-03-27: qty 2

## 2013-03-27 MED ORDER — POTASSIUM CHLORIDE CRYS ER 20 MEQ PO TBCR
40.0000 meq | EXTENDED_RELEASE_TABLET | Freq: Once | ORAL | Status: AC
  Administered 2013-03-27: 40 meq via ORAL
  Filled 2013-03-27: qty 2

## 2013-03-27 MED ORDER — SODIUM CHLORIDE 0.9 % IV SOLN: 1.0000 mL/kg/h | INTRAVENOUS | Status: AC

## 2013-03-27 MED ORDER — SODIUM CHLORIDE 0.9 % IV SOLN: INTRAVENOUS | Status: DC

## 2013-03-27 MED ORDER — DILTIAZEM HCL ER COATED BEADS 180 MG PO CP24
180.0000 mg | ORAL_CAPSULE | Freq: Every day | ORAL | Status: DC
  Administered 2013-03-27 – 2013-03-29 (×3): 180 mg via ORAL
  Filled 2013-03-27 (×3): qty 1

## 2013-03-27 MED ORDER — MIDAZOLAM HCL 2 MG/2ML IJ SOLN
INTRAMUSCULAR | Status: AC
  Filled 2013-03-27: qty 2

## 2013-03-27 MED ORDER — HYDROCODONE-ACETAMINOPHEN 5-325 MG PO TABS
0.5000 | ORAL_TABLET | Freq: Two times a day (BID) | ORAL | Status: DC | PRN
  Administered 2013-03-27 – 2013-03-28 (×3): 1 via ORAL
  Filled 2013-03-27 (×3): qty 1

## 2013-03-27 MED ORDER — OFF THE BEAT BOOK
Freq: Once | Status: AC
  Administered 2013-03-27: 03:00:00
  Filled 2013-03-27: qty 1

## 2013-03-27 MED ORDER — DOCUSATE SODIUM 100 MG PO CAPS: 100.0000 mg | ORAL_CAPSULE | Freq: Two times a day (BID) | ORAL | Status: DC | PRN

## 2013-03-27 MED ORDER — DSS 100 MG PO CAPS: 100.0000 mg | ORAL_CAPSULE | Freq: Two times a day (BID) | ORAL | Status: DC | PRN

## 2013-03-27 MED ORDER — LOSARTAN POTASSIUM 25 MG PO TABS
25.0000 mg | ORAL_TABLET | Freq: Every day | ORAL | Status: DC
  Administered 2013-03-27 – 2013-03-29 (×3): 25 mg via ORAL
  Filled 2013-03-27 (×3): qty 1

## 2013-03-27 MED ORDER — HYDROCHLOROTHIAZIDE 12.5 MG PO CAPS
12.5000 mg | ORAL_CAPSULE | Freq: Every day | ORAL | Status: DC
  Filled 2013-03-27: qty 1

## 2013-03-27 MED ORDER — OMEGA-3-ACID ETHYL ESTERS 1 G PO CAPS
1.0000 g | ORAL_CAPSULE | Freq: Two times a day (BID) | ORAL | Status: DC
  Administered 2013-03-27 – 2013-03-29 (×5): 1 g via ORAL
  Filled 2013-03-27 (×6): qty 1

## 2013-03-27 MED ORDER — HYDROCHLOROTHIAZIDE 25 MG PO TABS
12.5000 mg | ORAL_TABLET | Freq: Every day | ORAL | Status: DC
  Filled 2013-03-27: qty 0.5

## 2013-03-27 NOTE — H&P (View-Only) (Signed)
TELEMETRY: Reviewed telemetry pt in NSR: Filed Vitals:   03/27/13 0934 03/27/13 1109 03/27/13 1346 03/27/13 1355  BP:  142/51 144/114 158/66  Pulse:   62   Temp:   98.7 F (37.1 C)   TempSrc:   Oral   Resp:   17   Height:      Weight: 131 lb 2.8 oz (59.5 kg)     SpO2:   100%     Intake/Output Summary (Last 24 hours) at 03/27/13 1502 Last data filed at 03/27/13 1300  Gross per 24 hour  Intake      0 ml  Output      0 ml  Net      0 ml    SUBJECTIVE Feels better today. Denies chest pain or palpitations.  LABS: Basic Metabolic Panel:  Recent Labs  16/10/96 2205 03/27/13 0610 03/27/13 0810  NA 135 137  --   K 3.9 3.7  --   CL 100 104  --   CO2 26 26  --   GLUCOSE 96 103*  --   BUN 14 11  --   CREATININE 0.80 0.72  --   CALCIUM 8.7 8.3*  --   MG  --   --  2.2   CBC:  Recent Labs  03/26/13 2205 03/27/13 0610  WBC 6.1 6.5  NEUTROABS 3.9  --   HGB 12.7 11.8*  HCT 37.7 34.8*  MCV 85.9 85.5  PLT 214 184   Cardiac Enzymes:  Recent Labs  03/26/13 2205 03/27/13 0233 03/27/13 0810  TROPONINI 1.67* 12.30* 6.00*    Radiology/Studies:  Dg Chest Port 1 View  03/26/2013  *RADIOLOGY REPORT*  Clinical Data: Shortness of breath  PORTABLE CHEST - 1 VIEW  Comparison: 08/29/2012  Findings: Prominent cardiac contour.  Retrocardiac mass is most in keeping with a hiatal hernia.  Aortic atherosclerosis.  Central vascular congestion.  Mild interstitial prominence.  No confluent airspace opacities, large pleural effusion, or pneumothorax. Osteopenia, otherwise nondiagnostic osseous evaluation due to technique.  IMPRESSION: Cardiomegaly with central vascular congestion. Mild interstitial prominence may be chronic or interstitial edema.  Large hiatal hernia.   Original Report Authenticated By: Jearld Lesch, M.D.    ECG; NSR. Septal infarct age undetermined. Qtc 463 msec  PHYSICAL EXAM General: Well developed, elderly, in no acute distress. Head: Normocephalic,  atraumatic, sclera non-icteric Neck: Negative for carotid bruits. JVD not elevated. Lungs: Clear bilaterally to auscultation without wheezes, rales, or rhonchi. Breathing is unlabored. Heart: RRR S1 S2 with 2-3/6 holosystolic murmur at the apex. Abdomen: Soft, non-tender, non-distended with normoactive bowel sounds. No hepatomegaly. No rebound/guarding. No obvious abdominal masses. Msk:  Strength and tone appears normal for age. Extremities: No clubbing, cyanosis or edema.  Distal pedal pulses are 2+ and equal bilaterally. Neuro: Alert and oriented X 3. Moves all extremities spontaneously. Psych:  Responds to questions appropriately with a normal affect.  ASSESSMENT AND PLAN: 1. NSTEMI. Possibly related to demand ishcemia with Afib but may be primary. On ASA and beta blocker. Recommend cardiac cath today to assess coronary anatomy. The procedure and risks were reviewed including but not limited to death, myocardial infarction, stroke, arrythmias, bleeding, transfusion, emergency surgery, dye allergy, or renal dysfunction. The patient voices understanding and is agreeable to proceed. 2. Paroxysmal Afib with RVR. Now back in NSR. Intolerant of Norpace and amiodarone in the past. Probably not a candidate for Tikosyn due to prolonged QTc. In setting of CAD not a candidate for 1C agents. ?  Trial of Multaq. Echo pending.   Principal Problem:   NSTEMI (non-ST elevated myocardial infarction) Active Problems:   HYPERTENSION   Hypertrophic obstructive cardiomyopathy   Atrial fibrillation    Signed, Venesa Semidey [place] MD,FACC 03/27/2013 3:02 PM

## 2013-03-27 NOTE — Progress Notes (Signed)
IV start attempted x's 3, unsuccessful. IV team paged.

## 2013-03-27 NOTE — ED Notes (Signed)
Dr. Charm Barges informed of Troponin

## 2013-03-27 NOTE — Progress Notes (Signed)
 TELEMETRY: Reviewed telemetry pt in NSR: Filed Vitals:   03/27/13 0934 03/27/13 1109 03/27/13 1346 03/27/13 1355  BP:  142/51 144/114 158/66  Pulse:   62   Temp:   98.7 F (37.1 C)   TempSrc:   Oral   Resp:   17   Height:      Weight: 131 lb 2.8 oz (59.5 kg)     SpO2:   100%     Intake/Output Summary (Last 24 hours) at 03/27/13 1502 Last data filed at 03/27/13 1300  Gross per 24 hour  Intake      0 ml  Output      0 ml  Net      0 ml    SUBJECTIVE Feels better today. Denies chest pain or palpitations.  LABS: Basic Metabolic Panel:  Recent Labs  03/26/13 2205 03/27/13 0610 03/27/13 0810  NA 135 137  --   K 3.9 3.7  --   CL 100 104  --   CO2 26 26  --   GLUCOSE 96 103*  --   BUN 14 11  --   CREATININE 0.80 0.72  --   CALCIUM 8.7 8.3*  --   MG  --   --  2.2   CBC:  Recent Labs  03/26/13 2205 03/27/13 0610  WBC 6.1 6.5  NEUTROABS 3.9  --   HGB 12.7 11.8*  HCT 37.7 34.8*  MCV 85.9 85.5  PLT 214 184   Cardiac Enzymes:  Recent Labs  03/26/13 2205 03/27/13 0233 03/27/13 0810  TROPONINI 1.67* 12.30* 6.00*    Radiology/Studies:  Dg Chest Port 1 View  03/26/2013  *RADIOLOGY REPORT*  Clinical Data: Shortness of breath  PORTABLE CHEST - 1 VIEW  Comparison: 08/29/2012  Findings: Prominent cardiac contour.  Retrocardiac mass is most in keeping with a hiatal hernia.  Aortic atherosclerosis.  Central vascular congestion.  Mild interstitial prominence.  No confluent airspace opacities, large pleural effusion, or pneumothorax. Osteopenia, otherwise nondiagnostic osseous evaluation due to technique.  IMPRESSION: Cardiomegaly with central vascular congestion. Mild interstitial prominence may be chronic or interstitial edema.  Large hiatal hernia.   Original Report Authenticated By: Andrew  DelGaizo, M.D.    ECG; NSR. Septal infarct age undetermined. Qtc 463 msec  PHYSICAL EXAM General: Well developed, elderly, in no acute distress. Head: Normocephalic,  atraumatic, sclera non-icteric Neck: Negative for carotid bruits. JVD not elevated. Lungs: Clear bilaterally to auscultation without wheezes, rales, or rhonchi. Breathing is unlabored. Heart: RRR S1 S2 with 2-3/6 holosystolic murmur at the apex. Abdomen: Soft, non-tender, non-distended with normoactive bowel sounds. No hepatomegaly. No rebound/guarding. No obvious abdominal masses. Msk:  Strength and tone appears normal for age. Extremities: No clubbing, cyanosis or edema.  Distal pedal pulses are 2+ and equal bilaterally. Neuro: Alert and oriented X 3. Moves all extremities spontaneously. Psych:  Responds to questions appropriately with a normal affect.  ASSESSMENT AND PLAN: 1. NSTEMI. Possibly related to demand ishcemia with Afib but may be primary. On ASA and beta blocker. Recommend cardiac cath today to assess coronary anatomy. The procedure and risks were reviewed including but not limited to death, myocardial infarction, stroke, arrythmias, bleeding, transfusion, emergency surgery, dye allergy, or renal dysfunction. The patient voices understanding and is agreeable to proceed. 2. Paroxysmal Afib with RVR. Now back in NSR. Intolerant of Norpace and amiodarone in the past. Probably not a candidate for Tikosyn due to prolonged QTc. In setting of CAD not a candidate for 1C agents. ?   Trial of Multaq. Echo pending.   Principal Problem:   NSTEMI (non-ST elevated myocardial infarction) Active Problems:   HYPERTENSION   Hypertrophic obstructive cardiomyopathy   Atrial fibrillation    Signed, Peter Jordan MD,FACC 03/27/2013 3:02 PM      

## 2013-03-27 NOTE — Progress Notes (Signed)
ANTICOAGULATION CONSULT NOTE - Follow-up  Pharmacy Consult for heparin Indication: atrial fibrillation  Allergies  Allergen Reactions  . Irbesartan     REACTION: swelling  . Norpace (Disopyramide)     Dry mouth  . Ramipril     REACTION: lips swelling  . Telmisartan-Hctz     REACTION: incontinence  . Morphine And Related Other (See Comments)    "drives me crazy"    Patient Measurements: Height: 4\' 11"  (149.9 cm) Weight: 131 lb 13.4 oz (59.8 kg) IBW/kg (Calculated) : 43.2 Heparin Dosing Weight: 57 kg  Vital Signs: Temp: 98.1 F (36.7 C) (04/22 0453) Temp src: Oral (04/22 0453) BP: 146/48 mmHg (04/22 0453) Pulse Rate: 62 (04/22 0453)  Labs:  Recent Labs  03/26/13 2157 03/26/13 2205 03/26/13 2320 03/27/13 0233 03/27/13 0610 03/27/13 0810  HGB 13.3 12.7  --   --  11.8*  --   HCT 39.0 37.7  --   --  34.8*  --   PLT  --  214  --   --  184  --   LABPROT  --   --  12.3  --   --   --   INR  --   --  0.92  --   --   --   HEPARINUNFRC  --   --   --   --   --  0.58  CREATININE 0.80 0.80  --   --  0.72  --   TROPONINI  --  1.67*  --  12.30*  --  6.00*    Estimated Creatinine Clearance: 38.2 ml/min (by C-G formula based on Cr of 0.72).  Assessment: 79 yo female presented with atrial fibrillation with RVR. She continues on IV heparin for anticoagulation. Heparin level is therapeutic at 0.58, H/H 11.8/34.8, plts 184. Noted history of anemia. No bleeding noted.   Goal of Therapy:  Heparin level 0.3-0.7 units/ml Monitor platelets by anticoagulation protocol: Yes   Plan:  1. Continue heparin gtt at 900 units/hr 2. Check an 8 hour heparin level to confirm 3. Continue daily heparin level and CBC 4. F/u plans for oral anticoagulation  Lysle Pearl, PharmD, BCPS Pager # [phone] 03/27/2013 9:03 AM

## 2013-03-27 NOTE — Interval H&P Note (Signed)
History and Physical Interval Note:  03/27/2013 3:09 PM  Cindy [name]  has presented today for surgery, with the diagnosis of cp  The various methods of treatment have been discussed with the patient and family. After consideration of risks, benefits and other options for treatment, the patient has consented to  Procedure(s): LEFT HEART CATHETERIZATION WITH CORONARY ANGIOGRAM (N/A) as a surgical intervention .  The patient's history has been reviewed, patient examined, no change in status, stable for surgery.  I have reviewed the patient's chart and labs.  Questions were answered to the patient's satisfaction.     Theron Arista [facility] 03/27/2013 3:09 PM

## 2013-03-27 NOTE — Progress Notes (Signed)
  Echocardiogram 2D Echocardiogram has been performed.  Georgian Co 03/27/2013, 12:11 PM

## 2013-03-27 NOTE — CV Procedure (Signed)
   Cardiac Catheterization Procedure Note  Name: Cindy [name] MRN: [id number] DOB: 1922/11/09  Procedure: Left Heart Cath, Selective Coronary Angiography, LV angiography  Indication: 79 yo WF presents with atrial fibrillation with RVR and a NSTEMI.   Procedural details: We initially accessed the right radial artery using the standard Seldinger technique. Despite good arterial access we were unable to access the right innominate because of marked tortuosity. The right groin was prepped, draped, and anesthetized with 1% lidocaine. Using modified Seldinger technique, a 5 French sheath was introduced into the right femoral artery. Standard Judkins catheters were used for coronary angiography and left ventriculography. Catheter exchanges were performed over a guidewire. There were no immediate procedural complications. The patient was transferred to the post catheterization recovery area for further monitoring.  Procedural Findings: Hemodynamics:  AO 144/43 with a mean of 78 mmHg LV 158/16 mmHg   Coronary angiography: Coronary dominance: right  Left mainstem: Normal.  Left anterior descending (LAD): The left anterior descending artery is a small-caliber vessel that extends to the apex. It is diffusely diseased in the mid vessel up to 40%. The first diagonal ranch is moderate in length with small in caliber. There is a 99% stenosis proximally. The second diagonal is a smaller branch which has 70% stenosis at its origin.  Left circumflex (LCx): The left circumflex gives rise to a very small first marginal branch and then a larger second marginal branch. There is a 50% stenosis in the mid vessel.  Right coronary artery (RCA): The right coronary is a large dominant vessel. It has scattered irregularities less than 20%.  Left ventriculography: Left ventricular systolic function is normal, there is mild mid anterior wall hypokinesis, LVEF is estimated at 65%, there is moderate mitral  regurgitation   Final Conclusions:   1. Single vessel obstructive coronary disease. There is a critical stenosis in the first diagonal branch. This vessel is very small in diameter and not suitable for percutaneous intervention. 2. Normal LV function  Recommendations: Recommend medical therapy for her coronary disease.  Theron Arista [facility] 03/27/2013, 3:51 PM

## 2013-03-28 DIAGNOSIS — I421 Obstructive hypertrophic cardiomyopathy: Secondary | ICD-10-CM

## 2013-03-28 DIAGNOSIS — I214 Non-ST elevation (NSTEMI) myocardial infarction: Secondary | ICD-10-CM

## 2013-03-28 LAB — CBC
Hemoglobin: 10.6 g/dL — ABNORMAL LOW (ref 12.0–15.0)
MCH: 28.5 pg (ref 26.0–34.0)
MCHC: 32.6 g/dL (ref 30.0–36.0)
MCV: 87.4 fL (ref 78.0–100.0)
RBC: 3.72 MIL/uL — ABNORMAL LOW (ref 3.87–5.11)

## 2013-03-28 NOTE — Progress Notes (Signed)
CARDIAC REHAB PHASE I   PRE:  Rate/Rhythm: 68SR PACs  BP:  Supine:   Sitting: 150/60  Standing:    SaO2: 98%RA  MODE:  Ambulation: 350 ft   POST:  Rate/Rhythm: 85SR PACs  BP:  Supine:   Sitting:170/70   Standing:    SaO2: 98%RA 1055-1150 Pt walked 350 ft on RA with hand held asst stopping once to rest. Denied CP but was slightly SOB. To recliner after walk. Gave pt MI booklet. Reviewed angina and NTG use and MI restrictions. Son in room. Pt very talkative. Tolerated walk well. Would recommend home PT to help with strengthening.   Luetta Nutting, RN BSN  03/28/2013 11:45 AM

## 2013-03-28 NOTE — Progress Notes (Signed)
TELEMETRY: Reviewed telemetry pt in NSR: Filed Vitals:   03/27/13 1931 03/27/13 2143 03/28/13 0524 03/28/13 0743  BP: 138/50 132/50 146/42 163/60  Pulse: 66 63 62 65  Temp:  98.1 F (36.7 C) 97.3 F (36.3 C) 97.8 F (36.6 C)  TempSrc:  Oral Oral Oral  Resp:  18 18 18   Height:      Weight:   130 lb 15.3 oz (59.4 kg)   SpO2:  99% 94% 98%    Intake/Output Summary (Last 24 hours) at 03/28/13 0844 Last data filed at 03/28/13 0800  Gross per 24 hour  Intake    720 ml  Output   1000 ml  Net   -280 ml    SUBJECTIVE Feels better today. Denies chest pain or palpitations. Still at bedrest.  LABS: Basic Metabolic Panel:  Recent Labs  16/10/96 2205 03/27/13 0610 03/27/13 0810 03/27/13 1424  NA 135 137  --   --   K 3.9 3.7  --  4.4  CL 100 104  --   --   CO2 26 26  --   --   GLUCOSE 96 103*  --   --   BUN 14 11  --   --   CREATININE 0.80 0.72  --   --   CALCIUM 8.7 8.3*  --   --   MG  --   --  2.2  --    CBC:  Recent Labs  03/26/13 2205  03/27/13 2011 03/28/13 0607  WBC 6.1  < > 7.4 6.6  NEUTROABS 3.9  --   --   --   HGB 12.7  < > 11.5* 10.6*  HCT 37.7  < > 35.2* 32.5*  MCV 85.9  < > 87.1 87.4  PLT 214  < > 194 164  < > = values in this interval not displayed. Cardiac Enzymes:  Recent Labs  03/27/13 0233 03/27/13 0810 03/27/13 1430  TROPONINI 12.30* 6.00* 1.88*    Radiology/Studies:  Dg Chest Port 1 View  03/26/2013  *RADIOLOGY REPORT*  Clinical Data: Shortness of breath  PORTABLE CHEST - 1 VIEW  Comparison: 08/29/2012  Findings: Prominent cardiac contour.  Retrocardiac mass is most in keeping with a hiatal hernia.  Aortic atherosclerosis.  Central vascular congestion.  Mild interstitial prominence.  No confluent airspace opacities, large pleural effusion, or pneumothorax. Osteopenia, otherwise nondiagnostic osseous evaluation due to technique.  IMPRESSION: Cardiomegaly with central vascular congestion. Mild interstitial prominence may be chronic or  interstitial edema.  Large hiatal hernia.   Original Report Authenticated By: Jearld Lesch, M.D.    ECG; NSR. Septal infarct age undetermined. Qtc 463 msec  Transthoracic Echocardiography  Patient: Cindy [name], Cindy [name] MR #: [id number] Study Date: 03/27/2013 Gender: F Age: 79 Height: 149.9cm Weight: 59.4kg BSA: 1.4m^2 Pt. Status: Room: 2019  ATTENDING [place], Peter PERFORMING Corinda Gubler, [facility] Score SONOGRAPHER Georgian Co, RDCS, CCT cc:  ------------------------------------------------------------ LV EF: 60% - 65%  ------------------------------------------------------------ Indications: Atrial fibrillation - 427.31.  ------------------------------------------------------------ History: PMH: Angina pectoris. Hypertrophic cardiomyopathy. Risk factors: Palpitations. Hypertension.  ------------------------------------------------------------ Study Conclusions  - Left ventricle: The cavity size was mildly dilated. Wall thickness was increased in a pattern of moderate LVH. There was focal basal hypertrophy. Systolic function was normal. The estimated ejection fraction was in the range of 60% to 65%. Features are consistent with a pseudonormal left ventricular filling pattern, with concomitant abnormal relaxation and increased filling pressure (grade 2 diastolic dysfunction). - Aortic valve: There was mild stenosis. Trivial  regurgitation. Valve area: 1.28cm^2(VTI). Valve area: 1.2cm^2 (Vmax). - Mitral valve: Mildly to moderately calcified annulus. The findings are consistent with mild stenosis. Moderate regurgitation. Valve area by continuity equation (using LVOT flow): 1.5cm^2. - Left atrium: The atrium was mildly dilated. - Right atrium: The atrium was mildly dilated. - Pulmonary arteries: Systolic pressure was mildly to moderately increased. PA peak pressure: 52mm Hg (S). Transthoracic echocardiography. M-mode, complete 2D, spectral  Doppler, and color Doppler. Height: Height: 149.9cm. Height: 59in. Weight: Weight: 59.4kg. Weight: 130.7lb. Body mass index: BMI: 26.5kg/m^2. Body surface area: BSA: 1.100m^2. Blood pressure: 142/51. Patient status: Inpatient. Location: Bedside.  ------------------------------------------------------------  ------------------------------------------------------------ Left ventricle: The cavity size was mildly dilated. Wall thickness was increased in a pattern of moderate LVH. There was focal basal hypertrophy. Systolic function was normal. The estimated ejection fraction was in the range of 60% to 65%. Features are consistent with a pseudonormal left ventricular filling pattern, with concomitant abnormal relaxation and increased filling pressure (grade 2 diastolic dysfunction).  ------------------------------------------------------------ Aortic valve: Moderately thickened, moderately calcified leaflets. Doppler: There was mild stenosis. Trivial regurgitation. VTI ratio of LVOT to aortic valve: 0.64. Valve area: 1.28cm^2(VTI). Indexed valve area: 0.83cm^2/m^2 (VTI). Peak velocity ratio of LVOT to aortic valve: 0.6. Valve area: 1.2cm^2 (Vmax). Indexed valve area: 0.78cm^2/m^2 (Vmax). Mean gradient: 18mm Hg (S). Peak gradient: 31mm Hg (S).  ------------------------------------------------------------ Aorta: Aortic root: The aortic root was normal in size. Ascending aorta: The ascending aorta was normal in size.  ------------------------------------------------------------ Mitral valve: Mildly to moderately calcified annulus. Doppler: The findings are consistent with mild stenosis. Moderate regurgitation. Valve area by pressure half-time: 2.62cm^2. Indexed valve area by pressure half-time: 1.7cm^2/m^2. Valve area by continuity equation (using LVOT flow): 1.5cm^2. Indexed valve area by continuity equation (using LVOT flow): 0.97cm^2/m^2. Mean gradient: 6mm Hg (D). Peak gradient:  19mm Hg (D).  ------------------------------------------------------------ Left atrium: The atrium was mildly dilated.  ------------------------------------------------------------ Right ventricle: The cavity size was normal. Systolic function was normal.  ------------------------------------------------------------ Pulmonic valve: Structurally normal valve. Cusp separation was normal. Doppler: Transvalvular velocity was within the normal range. Trivial regurgitation.  ------------------------------------------------------------ Tricuspid valve: Doppler: Mild regurgitation.  ------------------------------------------------------------ Pulmonary artery: Systolic pressure was mildly to moderately increased.  ------------------------------------------------------------ Right atrium: The atrium was mildly dilated.  ------------------------------------------------------------ Pericardium: There was no pericardial effusion.  ------------------------------------------------------------  2D measurements Normal Doppler measurements Normal Left ventricle Main pulmonary LVID ED, 47.5 mm 43-52 artery chord, Pressure, 52 mm Hg =30 PLAX S LVID ES, 36 mm 23-38 Pressure 10 mm Hg ------ chord, ED PLAX Left ventricle FS, chord, 24 % >29 Ea, lat 7.02 cm/s ------ PLAX ann, tiss LVPW, ED 13.1 mm ------ DP IVS/LVPW 0.82 <1.3 E/Ea, lat 26.2 ------ ratio, ED ann, tiss 1 Ventricular septum DP IVS, ED 10.8 mm ------ Ea, med 5.04 cm/s ------ LVOT ann, tiss Diam, S 16 mm ------ DP Area 2.01 cm^2 ------ E/Ea, med 36.5 ------ Aorta ann, tiss 1 Root diam, 33 mm ------ DP ED LVOT Left atrium Peak vel, 166 cm/s ------ AP dim 47 mm ------ S AP dim 3.05 cm/m^2 <2.2 VTI, S 49.4 cm ------ index Peak 11 mm Hg ------ Vol, S 93 ml ------ gradient, Vol index, 60.4 ml/m^2 ------ S S Aortic valve Peak vel, 277 cm/s ------ S Mean vel, 203 cm/s ------ S VTI, S 77.5 cm ------ Mean 18 mm Hg  ------ gradient, S Peak 31 mm Hg ------ gradient, S VTI ratio 0.64 ------ LVOT/AV Area, VTI 1.28 cm^2 ------ Area index 0.83 cm^2/m ------ (VTI) ^2 Peak  vel 0.6 ------ ratio, LVOT/AV Area, Vmax 1.2 cm^2 ------ Area index 0.78 cm^2/m ------ (Vmax) ^2 Regurg PHT 419 ms ------ Mitral valve Peak E vel 184 cm/s ------ Peak A vel 108 cm/s ------ Mean vel, 108 cm/s ------ D Decelerati 253 ms 150-23 on time 0 Pressure 84 ms ------ half-time Mean 6 mm Hg ------ gradient, D Peak 19 mm Hg ------ gradient, D Peak E/A 1.7 ------ ratio Area (PHT) 2.62 cm^2 ------ Area index 1.7 cm^2/m ------ (PHT) ^2 Area 1.5 cm^2 ------ (LVOT) continuity Area index 0.97 cm^2/m ------ (LVOT ^2 cont) Annulus 66.2 cm ------ VTI Max regurg 660 cm/s ------ vel Regurg VTI 222 cm ------ Tricuspid valve Regurg 343 cm/s ------ peak vel Peak RV-RA 47 mm Hg ------ gradient, S Max regurg 343 cm/s ------ vel Systemic veins Estimated 5 mm Hg ------ CVP Right ventricle Pressure, 52 mm Hg <30 S Sa vel, 12 cm/s ------ lat ann, tiss DP Pulmonic valve Regurg 111 cm/s ------ vel, ED  ------------------------------------------------------------ Prepared and Electronically Authenticated by  Kristeen Miss [id number]:[id number]  PHYSICAL EXAM General: Well developed, elderly, in no acute distress. Head: Normocephalic, atraumatic, sclera non-icteric Neck: Negative for carotid bruits. JVD not elevated. Lungs: Clear bilaterally to auscultation without wheezes, rales, or rhonchi. Breathing is unlabored. Heart: RRR S1 S2 with 2-3/6 holosystolic murmur at the apex. Abdomen: Soft, non-tender, non-distended with normoactive bowel sounds. No hepatomegaly. No rebound/guarding. No obvious abdominal masses. Msk:  Strength and tone appears normal for age. Extremities: No clubbing, cyanosis or edema.  Distal pedal pulses are 2+ and equal bilaterally. Right groin is soft. ? Mild hematoma laterally  versus soft tissue. Neuro: Alert and oriented X 3. Moves all extremities spontaneously. Psych:  Responds to questions appropriately with a normal affect.  ASSESSMENT AND PLAN: 1. NSTEMI. Secondary to high grade diagonal disease. This vessel is small in diameter and not amenable to PCI. On ASA and beta blocker. Will ambulate today and if stable plan DC tomorrow. 2. Paroxysmal Afib with RVR. Now back in NSR. Intolerant of Norpace and amiodarone in the past. Not a candidate for Tikosyn due to prolonged QTc. In setting of CAD not a candidate for 1C agents. The patient has really had pretty rare episodes. If she has more frequent recurrances could consider Multaq. Poor candidate for anticoagulation with history of falls and some internal bleeding according to son.  3. Moderate MR. 4. Hypertrophic CM  Principal Problem:   NSTEMI (non-ST elevated myocardial infarction) Active Problems:   HYPERTENSION   Hypertrophic obstructive cardiomyopathy   Atrial fibrillation    Signed, Peter [place] MD,FACC 03/28/2013 8:44 AM

## 2013-03-29 ENCOUNTER — Encounter (HOSPITAL_COMMUNITY): Payer: Self-pay | Admitting: Physician Assistant

## 2013-03-29 ENCOUNTER — Telehealth: Payer: Self-pay | Admitting: Cardiology

## 2013-03-29 LAB — CBC
MCHC: 34.1 g/dL (ref 30.0–36.0)
Platelets: 203 10*3/uL (ref 150–400)
RDW: 13.5 % (ref 11.5–15.5)

## 2013-03-29 MED ORDER — ATORVASTATIN CALCIUM 20 MG PO TABS: 20.0000 mg | ORAL_TABLET | Freq: Every day | ORAL | Status: DC

## 2013-03-29 MED ORDER — ASPIRIN 81 MG PO CHEW: 81.0000 mg | CHEWABLE_TABLET | Freq: Every day | ORAL | Status: AC

## 2013-03-29 MED ORDER — NITROGLYCERIN 0.4 MG SL SUBL: 0.4000 mg | SUBLINGUAL_TABLET | SUBLINGUAL | Status: DC | PRN

## 2013-03-29 NOTE — Progress Notes (Signed)
CARDIAC REHAB PHASE I   PRE:  Rate/Rhythm: 67SR  BP:  Supine:   Sitting: 142/70  Standing:    SaO2: 97%RA  MODE:  Ambulation: 500 ft   POST:  Rate/Rhythm: 72  BP:  Supine:   Sitting: 170/66  Standing:    SaO2: 98%RA 1040-1111 Assisted pt from bathroom and let us rest a few minutes before we walked. Pt walked 500 ft with hand held asst and gait belt. Pt rested twice. Pt does not have transportation for CRP 2. Encouraged walking as tolerated with family. To recliner after walk.   Luetta Nutting, RN BSN  03/29/2013 11:08 AM

## 2013-03-29 NOTE — Progress Notes (Signed)
Assessment unchanged. Discussed D/C instructions with pt and son. Verbalized understanding. Rx given to pt. IV and tele removed. Pt left via W/C accompanied by NT.

## 2013-03-29 NOTE — Progress Notes (Signed)
TELEMETRY: Reviewed telemetry pt in NSR: Filed Vitals:   03/28/13 0743 03/28/13 0900 03/28/13 2035 03/29/13 0423  BP: 163/60 136/34 132/73 132/100  Pulse: 65 63 63 63  Temp: 97.8 F (36.6 C)  97.5 F (36.4 C) 97.8 F (36.6 C)  TempSrc: Oral  Oral Oral  Resp: 18  18 18   Height:      Weight:      SpO2: 98%  97% 94%    Intake/Output Summary (Last 24 hours) at 03/29/13 1610 Last data filed at 03/28/13 2343  Gross per 24 hour  Intake    720 ml  Output   1251 ml  Net   -531 ml    SUBJECTIVE Feels better today. Denies chest pain or palpitations. Does feel weak.  LABS: Basic Metabolic Panel:  Recent Labs  96/04/54 2205 03/27/13 0610 03/27/13 0810 03/27/13 1424  NA 135 137  --   --   K 3.9 3.7  --  4.4  CL 100 104  --   --   CO2 26 26  --   --   GLUCOSE 96 103*  --   --   BUN 14 11  --   --   CREATININE 0.80 0.72  --   --   CALCIUM 8.7 8.3*  --   --   MG  --   --  2.2  --    CBC:  Recent Labs  03/26/13 2205  03/28/13 0607 03/29/13 0500  WBC 6.1  < > 6.6 5.5  NEUTROABS 3.9  --   --   --   HGB 12.7  < > 10.6* 10.8*  HCT 37.7  < > 32.5* 31.7*  MCV 85.9  < > 87.4 86.1  PLT 214  < > 164 203  < > = values in this interval not displayed. Cardiac Enzymes:  Recent Labs  03/27/13 0233 03/27/13 0810 03/27/13 1430  TROPONINI 12.30* 6.00* 1.88*    Radiology/Studies:  Dg Chest Port 1 View  03/26/2013  *RADIOLOGY REPORT*  Clinical Data: Shortness of breath  PORTABLE CHEST - 1 VIEW  Comparison: 08/29/2012  Findings: Prominent cardiac contour.  Retrocardiac mass is most in keeping with a hiatal hernia.  Aortic atherosclerosis.  Central vascular congestion.  Mild interstitial prominence.  No confluent airspace opacities, large pleural effusion, or pneumothorax. Osteopenia, otherwise nondiagnostic osseous evaluation due to technique.  IMPRESSION: Cardiomegaly with central vascular congestion. Mild interstitial prominence may be chronic or interstitial edema.  Large  hiatal hernia.   Original Report Authenticated By: Jearld Lesch, M.D.    ECG; NSR. Septal infarct age undetermined. Qtc 463 msec  Transthoracic Echocardiography  Patient: Cindy [name], Cindy [name] MR #: [id number] Study Date: 03/27/2013 Gender: F Age: 79 Height: 149.9cm Weight: 59.4kg BSA: 1.88m^2 Pt. Status: Room: 2019  ATTENDING [place], Madaleine Simmon PERFORMING Corinda Gubler, [facility] Score SONOGRAPHER Georgian Co, RDCS, CCT cc:  ------------------------------------------------------------ LV EF: 60% - 65%  ------------------------------------------------------------ Indications: Atrial fibrillation - 427.31.  ------------------------------------------------------------ History: PMH: Angina pectoris. Hypertrophic cardiomyopathy. Risk factors: Palpitations. Hypertension.  ------------------------------------------------------------ Study Conclusions  - Left ventricle: The cavity size was mildly dilated. Wall thickness was increased in a pattern of moderate LVH. There was focal basal hypertrophy. Systolic function was normal. The estimated ejection fraction was in the range of 60% to 65%. Features are consistent with a pseudonormal left ventricular filling pattern, with concomitant abnormal relaxation and increased filling pressure (grade 2 diastolic dysfunction). - Aortic valve: There was mild stenosis. Trivial regurgitation. Valve area: 1.28cm^2(VTI). Valve  area: 1.2cm^2 (Vmax). - Mitral valve: Mildly to moderately calcified annulus. The findings are consistent with mild stenosis. Moderate regurgitation. Valve area by continuity equation (using LVOT flow): 1.5cm^2. - Left atrium: The atrium was mildly dilated. - Right atrium: The atrium was mildly dilated. - Pulmonary arteries: Systolic pressure was mildly to moderately increased. PA peak pressure: 52mm Hg (S). Transthoracic echocardiography. M-mode, complete 2D, spectral Doppler, and color Doppler.  Height: Height: 149.9cm. Height: 59in. Weight: Weight: 59.4kg. Weight: 130.7lb. Body mass index: BMI: 26.5kg/m^2. Body surface area: BSA: 1.46m^2. Blood pressure: 142/51. Patient status: Inpatient. Location: Bedside.  ------------------------------------------------------------  ------------------------------------------------------------ Left ventricle: The cavity size was mildly dilated. Wall thickness was increased in a pattern of moderate LVH. There was focal basal hypertrophy. Systolic function was normal. The estimated ejection fraction was in the range of 60% to 65%. Features are consistent with a pseudonormal left ventricular filling pattern, with concomitant abnormal relaxation and increased filling pressure (grade 2 diastolic dysfunction).  ------------------------------------------------------------ Aortic valve: Moderately thickened, moderately calcified leaflets. Doppler: There was mild stenosis. Trivial regurgitation. VTI ratio of LVOT to aortic valve: 0.64. Valve area: 1.28cm^2(VTI). Indexed valve area: 0.83cm^2/m^2 (VTI). Peak velocity ratio of LVOT to aortic valve: 0.6. Valve area: 1.2cm^2 (Vmax). Indexed valve area: 0.78cm^2/m^2 (Vmax). Mean gradient: 18mm Hg (S). Peak gradient: 31mm Hg (S).  ------------------------------------------------------------ Aorta: Aortic root: The aortic root was normal in size. Ascending aorta: The ascending aorta was normal in size.  ------------------------------------------------------------ Mitral valve: Mildly to moderately calcified annulus. Doppler: The findings are consistent with mild stenosis. Moderate regurgitation. Valve area by pressure half-time: 2.62cm^2. Indexed valve area by pressure half-time: 1.7cm^2/m^2. Valve area by continuity equation (using LVOT flow): 1.5cm^2. Indexed valve area by continuity equation (using LVOT flow): 0.97cm^2/m^2. Mean gradient: 6mm Hg (D). Peak gradient: 19mm Hg  (D).  ------------------------------------------------------------ Left atrium: The atrium was mildly dilated.  ------------------------------------------------------------ Right ventricle: The cavity size was normal. Systolic function was normal.  ------------------------------------------------------------ Pulmonic valve: Structurally normal valve. Cusp separation was normal. Doppler: Transvalvular velocity was within the normal range. Trivial regurgitation.  ------------------------------------------------------------ Tricuspid valve: Doppler: Mild regurgitation.  ------------------------------------------------------------ Pulmonary artery: Systolic pressure was mildly to moderately increased.  ------------------------------------------------------------ Right atrium: The atrium was mildly dilated.  ------------------------------------------------------------ Pericardium: There was no pericardial effusion.  ------------------------------------------------------------  2D measurements Normal Doppler measurements Normal Left ventricle Main pulmonary LVID ED, 47.5 mm 43-52 artery chord, Pressure, 52 mm Hg =30 PLAX S LVID ES, 36 mm 23-38 Pressure 10 mm Hg ------ chord, ED PLAX Left ventricle FS, chord, 24 % >29 Ea, lat 7.02 cm/s ------ PLAX ann, tiss LVPW, ED 13.1 mm ------ DP IVS/LVPW 0.82 <1.3 E/Ea, lat 26.2 ------ ratio, ED ann, tiss 1 Ventricular septum DP IVS, ED 10.8 mm ------ Ea, med 5.04 cm/s ------ LVOT ann, tiss Diam, S 16 mm ------ DP Area 2.01 cm^2 ------ E/Ea, med 36.5 ------ Aorta ann, tiss 1 Root diam, 33 mm ------ DP ED LVOT Left atrium Peak vel, 166 cm/s ------ AP dim 47 mm ------ S AP dim 3.05 cm/m^2 <2.2 VTI, S 49.4 cm ------ index Peak 11 mm Hg ------ Vol, S 93 ml ------ gradient, Vol index, 60.4 ml/m^2 ------ S S Aortic valve Peak vel, 277 cm/s ------ S Mean vel, 203 cm/s ------ S VTI, S 77.5 cm ------ Mean 18 mm Hg  ------ gradient, S Peak 31 mm Hg ------ gradient, S VTI ratio 0.64 ------ LVOT/AV Area, VTI 1.28 cm^2 ------ Area index 0.83 cm^2/m ------ (VTI) ^2 Peak vel 0.6 ------ ratio, LVOT/AV  Area, Vmax 1.2 cm^2 ------ Area index 0.78 cm^2/m ------ (Vmax) ^2 Regurg PHT 419 ms ------ Mitral valve Peak E vel 184 cm/s ------ Peak A vel 108 cm/s ------ Mean vel, 108 cm/s ------ D Decelerati 253 ms 150-23 on time 0 Pressure 84 ms ------ half-time Mean 6 mm Hg ------ gradient, D Peak 19 mm Hg ------ gradient, D Peak E/A 1.7 ------ ratio Area (PHT) 2.62 cm^2 ------ Area index 1.7 cm^2/m ------ (PHT) ^2 Area 1.5 cm^2 ------ (LVOT) continuity Area index 0.97 cm^2/m ------ (LVOT ^2 cont) Annulus 66.2 cm ------ VTI Max regurg 660 cm/s ------ vel Regurg VTI 222 cm ------ Tricuspid valve Regurg 343 cm/s ------ peak vel Peak RV-RA 47 mm Hg ------ gradient, S Max regurg 343 cm/s ------ vel Systemic veins Estimated 5 mm Hg ------ CVP Right ventricle Pressure, 52 mm Hg <30 S Sa vel, 12 cm/s ------ lat ann, tiss DP Pulmonic valve Regurg 111 cm/s ------ vel, ED  ------------------------------------------------------------ Prepared and Electronically Authenticated by  Kristeen Miss 2014-04-22T14:[id number]  PHYSICAL EXAM General: Well developed, elderly, in no acute distress. Head: Normocephalic, atraumatic, sclera non-icteric Neck: Negative for carotid bruits. JVD not elevated. Lungs: Clear bilaterally to auscultation without wheezes, rales, or rhonchi. Breathing is unlabored. Heart: RRR S1 S2 with 2-3/6 holosystolic murmur at the apex. Abdomen: Soft, non-tender, non-distended with normoactive bowel sounds. No hepatomegaly. No rebound/guarding. No obvious abdominal masses. Msk:  Strength and tone appears normal for age. Extremities: No clubbing, cyanosis or edema.  Distal pedal pulses are 2+ and equal bilaterally. Right groin is soft. ? Mild hematoma laterally  versus soft tissue. Neuro: Alert and oriented X 3. Moves all extremities spontaneously. Psych:  Responds to questions appropriately with a normal affect.  ASSESSMENT AND PLAN: 1. NSTEMI. Secondary to high grade diagonal disease. This vessel is small in diameter and not amenable to PCI. On ASA and beta blocker.No recurrent chest pain. Stable for DC home today. 2. Paroxysmal Afib with RVR. Now back in NSR. Intolerant of Norpace and amiodarone in the past. Not a candidate for Tikosyn due to prolonged QTc. In setting of CAD not a candidate for 1C agents. The patient has really had pretty rare episodes. If she has more frequent recurrances could consider Multaq. Poor candidate for anticoagulation with history of falls and some internal bleeding according to son.  3. Moderate MR. 4. Hypertrophic CM 5. Deconditioning. Will arrange home PT.   Principal Problem:   NSTEMI (non-ST elevated myocardial infarction) Active Problems:   HYPERTENSION   Hypertrophic obstructive cardiomyopathy   Atrial fibrillation    Signed, Elfego Giammarino [place] MD,FACC 03/29/2013 6:38 AM

## 2013-03-29 NOTE — Care Management Note (Unsigned)
    Page 1 of 1   03/29/2013     4:06:38 PM   CARE MANAGEMENT NOTE 03/29/2013  Patient:  Cindy [name],Cindy [name]   Account Number:  [id number]  Date Initiated:  03/29/2013  Documentation initiated by:  Kenyatte Gruber  Subjective/Objective Assessment:   PT ADM ON 03/26/13 WITH SOB, AFIB WITH RVR.  PTA, PT LIVES ALONE.  HAS 3 SUPPORTIVE SONS IN THE AREA.     Action/Plan:   MET WITH PT AND SONS TO DISCUSS DC PLANS.  THEY REQUEST HH ([facility]) AT DC TO ASSIST PT AT HOME.  WILL REQUEST ORDERS FROM MD.  PT DENIES DME NEEDS.   Anticipated DC Date:  03/29/2013   Anticipated DC Plan:  HOME W HOME HEALTH SERVICES      DC Planning Services  CM consult      [facility] Choice  HOME HEALTH   Choice offered to / List presented to:  [name]-1 Patient        HH arranged  HH-1 RN  HH-2 PT  HH-3 OT  HH-4 NURSE'S AIDE      HH agency  [facility]   Status of service:  Completed, signed off Medicare Important Message given?   (If response is "NO", the following Medicare IM given date fields will be blank) Date Medicare IM given:   Date Additional Medicare IM given:    Discharge Disposition:  HOME W HOME HEALTH SERVICES  Per UR Regulation:  Reviewed for med. necessity/level of care/duration of stay  If discussed at Long Length of Stay Meetings, dates discussed:    Comments:

## 2013-03-29 NOTE — Evaluation (Signed)
Physical Therapy Evaluation Patient Details Name: Cindy [name] MRN: [id number] DOB: 05-03-1922 Today's Date: 03/29/2013 Time: 7829-5621 PT Time Calculation (min): 20 min  PT Assessment / Plan / Recommendation Clinical Impression  Pt is a pleasent 79 y.o. female who presented with chest pain. Pt demonstrates some modest deficits in functional mobility at this time and will benefit from HHPT to improve balance, mobility, and activity tolerance.  Will see acutely as indicated to maximize independence.    PT Assessment  Patient needs continued PT services    Follow Up Recommendations  Home health PT       Barriers to Discharge Decreased caregiver support      Equipment Recommendations  None recommended by PT    Recommendations for Other Services     Frequency Min 2X/week    Precautions / Restrictions Restrictions Weight Bearing Restrictions: No   Pertinent Vitals/Pain No pain at this imte      Mobility  Bed Mobility Bed Mobility: Not assessed Transfers Transfers: Sit to Stand;Stand to Sit Sit to Stand: 5: Supervision Stand to Sit: 5: Supervision Ambulation/Gait Ambulation/Gait Assistance: 5: Supervision Ambulation Distance (Feet): 160 Feet Assistive device: None Gait Pattern: Step-through pattern;Decreased stride length;Trunk flexed;Narrow base of support Gait velocity: decreased General Gait Details: some instability with ambulation but no significant LOB        PT Diagnosis: Difficulty walking;Generalized weakness  PT Problem List: Decreased activity tolerance;Decreased balance PT Treatment Interventions: DME instruction;Gait training;Stair training;Functional mobility training;Therapeutic activities;Therapeutic exercise   PT Goals Acute Rehab PT Goals PT Goal Formulation: With patient Time For Goal Achievement: 04/12/13 Potential to Achieve Goals: Fair Pt will go Sit to Stand: Independently PT Goal: Sit to Stand - Progress: Goal set today Pt will go  Stand to Sit: Independently PT Goal: Stand to Sit - Progress: Goal set today Pt will Ambulate: >150 feet;Independently PT Goal: Ambulate - Progress: Goal set today  Visit Information  Last PT Received On: 03/29/13 Assistance Needed: +1    Subjective Data  Subjective: I haven't slept in 3 days Patient Stated Goal: go home   Prior Functioning  Home Living Lives With: Alone Available Help at Discharge: Family Type of Home: House Home Access: Stairs to enter;Ramped entrance Entrance Stairs-Number of Steps: 2 Entrance Stairs-Rails: Right Home Layout: One level Bathroom Shower/Tub: Health visitor: Standard Home Adaptive Equipment: Paediatric nurse with back;Straight cane;Walker - rolling Prior Function Level of Independence: Independent Able to Take Stairs?: Yes Driving: No Vocation: Retired Musician: HOH Dominant Hand: Right    Cognition  Cognition Arousal/Alertness: Awake/alert Behavior During Therapy: WFL for tasks assessed/performed Overall Cognitive Status: Within Functional Limits for tasks assessed    Extremity/Trunk Assessment Right Upper Extremity Assessment RUE ROM/Strength/Tone: [facility] for tasks assessed Left Upper Extremity Assessment LUE ROM/Strength/Tone: WFL for tasks assessed Right Lower Extremity Assessment RLE ROM/Strength/Tone: WFL for tasks assessed Left Lower Extremity Assessment LLE ROM/Strength/Tone: WFL for tasks assessed   Balance  Some instability noted with standing activities  End of Session PT - End of Session Equipment Utilized During Treatment: Gait belt Activity Tolerance: Patient tolerated treatment well Patient left: in chair;with call bell/phone within reach;with nursing in room;with family/visitor present Nurse Communication: Mobility status  GP     Fabio Asa 03/29/2013, 3:29 PM  Charlotte Crumb, PT DPT  [phone]

## 2013-03-29 NOTE — Discharge Summary (Signed)
Discharge Summary   Patient ID: Cindy [name] MRN: [id number], DOB/AGE: Apr 16, 1922 79 y.o. Admit date: 03/26/2013 D/C date:     03/29/2013  Primary Cardiologist: [place]  Primary Discharge Diagnoses:  1. CAD (new dx) with NSTEMI this admission secondary to high-grade diagonal disease, for med rx - consider f/u outpt labs given statin initiation 2. Paroxysmal atrial fibrilation 3. Moderate MR by echo this admission 4. Hypertrophic cardiomyopathy 5. HTN 6. Mild AS by echo this admission  Secondary Discharge Diagnoses:  1. Hypercholesterolemia 2. Urinary incontinence 3. Diverticulosis of colon (without mention of hemorrhage) -  EGD/colonoscopy Barretts esophagus//H.H divertics 08/2000  4. Cervical mass - C2 lateral mass fracture  5. Hypothyroidism 6. Multinodular goiter (nontoxic)     7. Osteoporosis     8. Blood transfusion     9. Jaundice  ~ 1935 "in grade school"   10. Degenerative joint disease of back   11. Depression     12. Anemia, iron deficiency     13. Personal history of colonic polyps 02/29/2012, tubular adenoma  14. Barrett's esophagus     Hospital Course:[name] is an 79 y/o F with history of PAF (intol to amio/Norpace in the past, not on anticoag for falls & history of internal bleeding per son), hypertrophic cardiomyopathy, and HTN who presented to [facility] with complaints of tachypalpitation on day of admission, she developed chest pressure lasting longer than 1 hour. Per EMS report, HR was 150bpm and EKG showed lateral ST depression. Before reaching the ER, she converted to NSR spontaneously. Initial troponin was elevated and she was placed on heparin per pharmacy. Peak troponin 6.0. 2D echo showed mod LVH, focal basal hypertrophy, EF 60-65%, mod MR, mild AS. She underwent cardiac cath 03/27/13 which demonstrated Left mainstem: Normal.  Left anterior descending (LAD): The left anterior descending artery is a small-caliber vessel that extends to the apex.  It is diffusely diseased in the mid vessel up to 40%. The first diagonal ranch is moderate in length with small in caliber. There is a 99% stenosis proximally. The second diagonal is a smaller branch which has 70% stenosis at its origin.  Left circumflex (LCx): The left circumflex gives rise to a very small first marginal branch and then a larger second marginal branch. There is a 50% stenosis in the mid vessel.  Right coronary artery (RCA): The right coronary is a large dominant vessel. It has scattered irregularities less than 20%.  Left ventriculography: Left ventricular systolic function is normal, there is mild mid anterior wall hypokinesis, LVEF is estimated at 65%, there is moderate mitral regurgitation  Final Conclusions:  1. Single vessel obstructive coronary disease. There is a critical stenosis in the first diagonal branch. This vessel is very small in diameter and not suitable for percutaneous intervention.  2. Normal LV function Dr. [place] noted the critical stenosis in the first diagonal branch as the culprit, altough the vessel was very small in diameter and not suitable for percutaneous intervention. Medical therapy was recommended. Consideration was given to starting Tikosyn this admission for her paroxysmal atrial fibrillation, but she was not a candidate due to baseline QTc above the recommended limit. In setting of CAD not a candidate for 1C agents. If she has more frequent recurrences, Dr. [place] suggested trying Multaq. She remains a poor candidate for anticoagulation with history of falls and some internal bleeding according to son. Given this history, Plavix was not started. Statin was initiated. Per discussion with Dr. [place], will continue  diltiazem instead of BB for now (afib is now quiescent on current regimen). Dr. [place] has seen and examined her today and feels she is stable for discharge. HHPT, RN, aide have been ordered after observations by nursing and physician of  deconditioning. Rx's were sent in to her mail order and she also received a handwritten rx to cover her until she receives these. She was  Also noted to be mildly anemic which she has a history of. She was instructed to f/u PCP.  Discharge Vitals: Blood pressure 144/58, pulse 63, temperature 97.8 F (36.6 C), temperature source Oral, resp. rate 18, height 4\' 11"  (1.499 m), weight 130 lb 15.3 oz (59.4 kg), SpO2 94.00%.  Labs: Lab Results  Component Value Date   WBC 5.5 03/29/2013   HGB 10.8* 03/29/2013   HCT 31.7* 03/29/2013   MCV 86.1 03/29/2013   PLT 203 03/29/2013     Recent Labs Lab 03/27/13 0610 03/27/13 1424  NA 137  --   K 3.7 4.4  CL 104  --   CO2 26  --   BUN 11  --   CREATININE 0.72  --   CALCIUM 8.3*  --   GLUCOSE 103*  --     Recent Labs  03/26/13 2205 03/27/13 0233 03/27/13 0810 03/27/13 1430  TROPONINI 1.67* 12.30* 6.00* 1.88*   Lab Results  Component Value Date   CHOL 160 08/30/2012   HDL 62 08/30/2012   LDLCALC 83 08/30/2012   TRIG 77 08/30/2012     Diagnostic Studies/Procedures   1. Cardiac catheterization this admission, please see full report and above for summary.  2. 2D Echo 03/2013 - Left ventricle: The cavity size was mildly dilated. Wall thickness was increased in a pattern of moderate LVH. There was focal basal hypertrophy. Systolic function was normal. The estimated ejection fraction was in the range of 60% to 65%. Features are consistent with a pseudonormal left ventricular filling pattern, with concomitant abnormal relaxation and increased filling pressure (grade 2 diastolic dysfunction). - Aortic valve: There was mild stenosis. Trivial regurgitation. Valve area: 1.28cm^2(VTI). Valve area: 1.2cm^2 (Vmax). - Mitral valve: Mildly to moderately calcified annulus. The findings are consistent with mild stenosis. Moderate regurgitation. Valve area by continuity equation (using LVOT flow): 1.5cm^2. - Left atrium: The atrium was mildly  dilated. - Right atrium: The atrium was mildly dilated. - Pulmonary arteries: Systolic pressure was mildly to moderately increased. PA peak pressure: 52mm Hg (S).  3. Dg Chest Port 1 View 03/26/2013  *RADIOLOGY REPORT*  Clinical Data: Shortness of breath  PORTABLE CHEST - 1 VIEW  Comparison: 08/29/2012  Findings: Prominent cardiac contour.  Retrocardiac mass is most in keeping with a hiatal hernia.  Aortic atherosclerosis.  Central vascular congestion.  Mild interstitial prominence.  No confluent airspace opacities, large pleural effusion, or pneumothorax. Osteopenia, otherwise nondiagnostic osseous evaluation due to technique.  IMPRESSION: Cardiomegaly with central vascular congestion. Mild interstitial prominence may be chronic or interstitial edema.  Large hiatal hernia.   Original Report Authenticated By: Jearld Lesch, M.D.     Discharge Medications     Medication List    TAKE these medications       aspirin 81 MG chewable tablet  Chew 1 tablet (81 mg total) by mouth daily.     atorvastatin 20 MG tablet  Commonly known as:  LIPITOR  Take 1 tablet (20 mg total) by mouth daily.     BESIVANCE 0.6 % Susp  Generic drug:  Besifloxacin HCl  Place  1 drop into both eyes 2 (two) times daily.     diltiazem 180 MG 24 hr capsule  Commonly known as:  CARDIZEM CD  Take 1 capsule (180 mg total) by mouth daily.     DSS 100 MG Caps  Take 100 mg by mouth 2 (two) times daily as needed (for constipation).     fish oil-omega-3 fatty acids 1000 MG capsule  Take 1 g by mouth 2 (two) times daily.     GERITOL PO  Take 1 tablet by mouth daily.     hydrochlorothiazide 25 MG tablet  Commonly known as:  HYDRODIURIL  TAKE ONE-HALF TABLET (12.5 MG) DAILY     HYDROcodone-acetaminophen 5-325 MG per tablet  Commonly known as:  NORCO/VICODIN  Take 0.5-1 tablets by mouth 2 (two) times daily as needed for pain.     levothyroxine 25 MCG tablet  Commonly known as:  SYNTHROID, LEVOTHROID  TAKE 1  TABLET EVERY MORNING     losartan 50 MG tablet  Commonly known as:  COZAAR  Take 0.5 tablets (25 mg total) by mouth daily.     nitroGLYCERIN 0.4 MG SL tablet  Commonly known as:  NITROSTAT  Place 1 tablet (0.4 mg total) under the tongue every 5 (five) minutes as needed for chest pain (up to 3 doses).     Ocuvite PreserVision Tabs  Take 1 tablet by mouth daily.        Disposition   The patient will be discharged in stable condition to home. Discharge Orders   Future Appointments Provider Department Dept Phone   04/04/2013 3:20 PM Beatrice Lecher, PA-C [facility] Main Office [place]) [phone]   04/18/2013 11:15 AM Peter M [place], MD [facility] Main Office [place]) [phone]   05/04/2013 12:45 PM Sherrie George, MD [facility] [phone]   Future Orders Complete By Expires     Diet - low sodium heart healthy  As directed     Increase activity slowly  As directed     Comments:      No driving for 1 week. No lifting over 10 lbs for 2 weeks. No sexual activity for 2 weeks. Keep procedure site clean & dry. If you notice increased pain, swelling, bleeding or pus, call/return!  You may shower, but no soaking baths/hot tubs/pools for 1 week.      Follow-up Information   Follow up with Crawford Givens, MD. (You were mildly anemic. Please follow up with primary care doctor for this.)    Contact information:   [address] [place] [address] [phone]       Follow up with Tereso Newcomer, PA-C. (04/04/13 at 3:20pm)    Contact information:   [address] [place] [address] [phone]         Duration of Discharge Encounter: Greater than 30 minutes including physician and PA time.  Signed, Ronie Spies PA-C 03/29/2013, 9:31 AM

## 2013-03-29 NOTE — Discharge Summary (Signed)
Patient seen and examined and history reviewed. Agree with above findings and plan. See earlier rounding note.  Thedora Hinders 03/29/2013 10:23 AM

## 2013-03-29 NOTE — Telephone Encounter (Signed)
New Problem:    I scheduled a 7 day TCM Per Annabelle Harman PA for end of stemi with Tereso Newcomer on 04/04/13 at 4:30 p.m.

## 2013-03-29 NOTE — Plan of Care (Signed)
Problem: Phase I Progression Outcomes Goal: Vascular site scale level 0 - I Vascular Site Scale Level 0: No bruising/bleeding/hematoma Level I (Mild): Bruising/Ecchymosis, minimal bleeding/ooozing, palpable hematoma < 3 cm Level II (Moderate): Bleeding not affecting hemodynamic parameters, pseudoaneurysm, palpable hematoma > 3 cm  Outcome: Completed/Met Date Met:  03/29/13 Level 0

## 2013-03-30 NOTE — Telephone Encounter (Signed)
TCM management phone call to patient. Patient contacted regarding discharge from hospital on 4/24.  Patient understands to follow up with Cindy [name] on 4/30 at 3:20p.  Patient understands discharge instructions as provided on the discharge summary/discharge instructions.  Reviewed all medications. Patient understands medications/dosages/schedule. Advised to call MD if further questions or concerns come up prior to appointment on  4/30.

## 2013-04-02 ENCOUNTER — Telehealth: Payer: Self-pay

## 2013-04-02 NOTE — Telephone Encounter (Signed)
She is going to have an appointment with Alben Spittle on 04/04/13.  I would ask his/cards to address this.  I forwarded the message to him.

## 2013-04-02 NOTE — Telephone Encounter (Signed)
Gentry Roch, nurse with [facility] left v/m; Eunice Blase say pt on 03/31/13; request verbal orders for cardio vascular program with frequency of 1wk x 1; 2 wk x 3 and 1 wk x 6; will include med teaching, BP and weight monitoring, diet teaching with low Na . Pt has Lexicographer in her home and they will use that. Pt had recent MI and afib.Please advise.

## 2013-04-03 NOTE — Telephone Encounter (Signed)
Debbie advised.

## 2013-04-04 ENCOUNTER — Encounter: Payer: Medicare Other | Admitting: Physician Assistant

## 2013-04-05 ENCOUNTER — Telehealth: Payer: Self-pay | Admitting: *Deleted

## 2013-04-05 NOTE — Telephone Encounter (Signed)
Spoke to [place] at [facility] PA advised okay for patient to participate in cardio vascular program..Advised to keep appointment with Dr.Jordan 04/18/13.

## 2013-04-05 NOTE — Telephone Encounter (Signed)
Ok to order CV program with [facility] as noted. Keep f/u with Dr. Peter [place]  Tereso Newcomer, PA-C  8:47 AM 04/05/2013

## 2013-04-05 NOTE — Telephone Encounter (Signed)
Cindy [name] OT with [facility] left v/m; Cindy [name] did eval on 04/03/13 and  requesting verbal order for OT 2 x week for 3 weeks and 1 x a week for 1 week for endurance training & strengthening.Please advise.

## 2013-04-06 ENCOUNTER — Telehealth: Payer: Self-pay | Admitting: *Deleted

## 2013-04-06 NOTE — Telephone Encounter (Signed)
LMOVM for Harriett Sine and note forwarded to [facility] for [place] as requested.

## 2013-04-06 NOTE — Telephone Encounter (Signed)
Dr. Para March did give verbal order as described below.  The previous note was for OT.

## 2013-04-06 NOTE — Telephone Encounter (Signed)
Cindy [name] requesting verbal order for PT 2 x/week for 4 weeks.  Please advise ASAP, first appt today at 9 am.

## 2013-04-06 NOTE — Telephone Encounter (Signed)
Please give verbal order for OT 2 x week for 3 weeks and 1 x a week for 1 week for endurance training & strengthening and then notify Shirlee Limerick as a FYI.

## 2013-04-06 NOTE — Telephone Encounter (Signed)
See prev phone note

## 2013-04-06 NOTE — Telephone Encounter (Signed)
Please give the orders for both PT and OT.  Thanks.

## 2013-04-06 NOTE — Telephone Encounter (Signed)
Verbal order given  

## 2013-04-11 DIAGNOSIS — I214 Non-ST elevation (NSTEMI) myocardial infarction: Secondary | ICD-10-CM

## 2013-04-11 DIAGNOSIS — I4891 Unspecified atrial fibrillation: Secondary | ICD-10-CM

## 2013-04-11 DIAGNOSIS — F329 Major depressive disorder, single episode, unspecified: Secondary | ICD-10-CM

## 2013-04-11 DIAGNOSIS — I428 Other cardiomyopathies: Secondary | ICD-10-CM

## 2013-04-11 DIAGNOSIS — R269 Unspecified abnormalities of gait and mobility: Secondary | ICD-10-CM

## 2013-04-11 DIAGNOSIS — M1990 Unspecified osteoarthritis, unspecified site: Secondary | ICD-10-CM

## 2013-04-16 ENCOUNTER — Telehealth: Payer: Self-pay

## 2013-04-16 NOTE — Telephone Encounter (Signed)
Cindy [name] OT with [facility] left v/m to let Dr Para March know pt was discharged from OT early; pt said OT exercises were making her feel worse and causing more pain. Pt was already on light OT. Pt is doing her own self care and PT will be continuing. Any questions contact Cindy [name] at [place].

## 2013-04-16 NOTE — Telephone Encounter (Signed)
Noted, thanks!

## 2013-04-18 ENCOUNTER — Ambulatory Visit (INDEPENDENT_AMBULATORY_CARE_PROVIDER_SITE_OTHER): Payer: Medicare Other | Admitting: Cardiology

## 2013-04-18 ENCOUNTER — Encounter: Payer: Self-pay | Admitting: Cardiology

## 2013-04-18 VITALS — BP 164/49 | HR 62 | Ht 59.0 in | Wt 134.8 lb

## 2013-04-18 DIAGNOSIS — I214 Non-ST elevation (NSTEMI) myocardial infarction: Secondary | ICD-10-CM

## 2013-04-18 DIAGNOSIS — I4891 Unspecified atrial fibrillation: Secondary | ICD-10-CM

## 2013-04-18 DIAGNOSIS — I10 Essential (primary) hypertension: Secondary | ICD-10-CM

## 2013-04-18 NOTE — Patient Instructions (Addendum)
Continue your current therapy.  I will see you back in 2 months.

## 2013-04-18 NOTE — Progress Notes (Signed)
Cindy [name] Date of Birth: 04/07/1946 Medical Record #[id number]  History of Present Illness: Cindy [name] is seen for followup after recent hospitalization with atrial fibrillation or rapid ventricular response and a non-ST elevation myocardial infarction. She presented with a heart rate of 150 beats per minute. She converted spontaneously. She underwent cardiac catheterization which demonstrated a high-grade stenosis in the first diagonal branch. This was a moderate vessel in length but small in caliber. She also a 70% stenosis in the second diagonal. Medical therapy was recommended. She has been intolerant of amiodarone and Norpace in the past. In the hospital she was not a candidate for Tikosyn because of elevated QT. She is not a candidate for anticoagulation due to to a history of falls. On followup today she complains of feeling tired. She is getting physical and occupational therapy and states that this is wearing her out. This past Sunday she had an active day and felt her heart racing for a little bit. She's had no recurrent chest pain.  Current Outpatient Prescriptions on File Prior to Visit  Medication Sig Dispense Refill  . aspirin 81 MG chewable tablet Chew 1 tablet (81 mg total) by mouth daily.      Marland Kitchen atorvastatin (LIPITOR) 20 MG tablet Take 1 tablet (20 mg total) by mouth daily.  90 tablet  1  . BESIVANCE 0.6 % SUSP Place 1 drop into both eyes 2 (two) times daily.       Marland Kitchen diltiazem (CARDIZEM CD) 180 MG 24 hr capsule Take 1 capsule (180 mg total) by mouth daily.  90 capsule  3  . Docusate Sodium (DSS) 100 MG CAPS Take 100 mg by mouth 2 (two) times daily as needed (for constipation).       . fish oil-omega-3 fatty acids 1000 MG capsule Take 1 g by mouth 2 (two) times daily.        . hydrochlorothiazide (HYDRODIURIL) 25 MG tablet Take 12.5 mg by mouth daily.      Marland Kitchen HYDROcodone-acetaminophen (NORCO/VICODIN) 5-325 MG per tablet Take 0.5-1 tablets by mouth 2 (two) times daily as needed  for pain.      . Iron-Vitamins (GERITOL PO) Take 1 tablet by mouth daily.      Marland Kitchen levothyroxine (SYNTHROID, LEVOTHROID) 25 MCG tablet Take 25 mcg by mouth every morning.      Marland Kitchen losartan (COZAAR) 50 MG tablet Take 0.5 tablets (25 mg total) by mouth daily.      . Multiple Vitamins-Minerals (OCUVITE PRESERVISION) TABS Take 1 tablet by mouth daily.        . nitroGLYCERIN (NITROSTAT) 0.4 MG SL tablet Place 1 tablet (0.4 mg total) under the tongue every 5 (five) minutes as needed for chest pain (up to 3 doses).  25 tablet  3   No current facility-administered medications on file prior to visit.    Allergies  Allergen Reactions  . Irbesartan     REACTION: swelling  . Norpace (Disopyramide)     Dry mouth  . Ramipril     REACTION: lips swelling  . Telmisartan-Hctz     REACTION: incontinence  . Morphine And Related Other (See Comments)    "drives me crazy"    Past Medical History  Diagnosis Date  . Atrial fibrillation 04/23-24/2007    a. recurrent PAF with RVR in September 2013. b. Evaluated 03/2013, previously intolerant to Norpace and Amiodarone - consider Multaq if recurs. c. Not on anticoag due to history of falls and also some internal bleeding per  son.  . Hypertrophic cardiomyopathy   . Urinary incontinence   . Diverticulosis of colon (without mention of hemorrhage) 2003/ 08/2000    EGD/colonoscopy Barretts esophagus//H.H divertics 08/2000  . Cervical mass     C2 lateral mass fracture  . Hypertension   . Hypercholesterolemia     219/497  . Hypothyroidism   . Multinodular goiter (nontoxic)   . Osteoporosis   . Blood transfusion   . Jaundice ~ 1935    "in grade school"  . Degenerative joint disease     back  . Depression     "husband died Feb 14, 2011"  . Anemia, iron deficiency   . Personal history of colonic polyps 02/29/2012    tubular adenoma  . Barrett's esophagus   . CAD (coronary artery disease)     a. NSTEMI 03/2013: secondary to diagonal disease (small, not amenable to  PCI, for med rx).  . Moderate mitral regurgitation 2014  . Mild aortic stenosis 2014    Past Surgical History  Procedure Laterality Date  . Bladder surgery      bladder tack early 90's  . Tear duct probing  07/29/03    tear duck surg  . Cystourethroscopy  09/17/03  . Rotator cuff repair  ? date; 09/07/05    left; right( Dr. Darrelyn Hillock)  . Appendectomy  1941  . Breast surgery  1981    breast reduction  . Eye surgery  03/2002    cataract OS  . Thyroid ultrasound  10/14/2003    MNG, no dominant masses  . Doppler echocardiography  03/05/2002&09/11/2003    ECHO, EF wnl, mild stenosis, A.S. mild MR, Mild T.R03/31/2003//ECHO EF 70%,LVH, ?diast dysfunction 09/11/2003  . Cataract extraction w/ intraocular lens  implant, bilateral  2003  . Dilation and curettage of uterus  09/07/2000    endometrial polyps removed, path all benign   . Tonsillectomy and adenoidectomy      "as a child"  . Fracture surgery  2010    right knee    History  Smoking status  . Former Smoker -- 0.50 packs/day for 4 years  . Types: Cigarettes  . Quit date: 07/06/1974  Smokeless tobacco  . Never Used    History  Alcohol Use No    Family History  Problem Relation Age of Onset  . Heart failure Mother     CHF, DM, HBP  . Hypertension Mother   . Uterine cancer Mother   . Stroke Mother   . Colon cancer Neg Hx   . Esophageal cancer Neg Hx   . Rectal cancer Neg Hx   . Stomach cancer Neg Hx     Review of Systems: The review of systems is positive for chronic fatigue.  All other systems were reviewed and are negative.  Physical Exam: BP 164/49  Pulse 62  Ht 4\' 11"  (1.499 m)  Wt 134 lb 12.8 oz (61.145 kg)  BMI 27.21 kg/m2  SpO2 97% She is an elderly, anxious white female in no acute distress. HEENT: Normocephalic, atraumatic. Pupils are equal round and reactive to light accommodation. Oropharynx clear. Neck is without jugular venous distention or bruits. There is no adenopathy or thyromegaly. Lungs  are clear. Cardiovascular: Regular rate and rhythm. Grade 2/6 harsh systolic murmur the right upper sternal border. There is no S3. Abdomen: Soft and nontender. No masses or bruits. Extremities: No cyanosis or edema. Pedal pulses are 2+ and symmetric. Neuro: Alert and oriented x3. Cranial nerves II through XII are intact. Mood is anxious. LABORATORY  DATA: Lab Results  Component Value Date   WBC 5.5 03/29/2013   HGB 10.8* 03/29/2013   HCT 31.7* 03/29/2013   PLT 203 03/29/2013   GLUCOSE 103* 03/27/2013   CHOL 160 08/30/2012   TRIG 77 08/30/2012   HDL 62 08/30/2012   LDLDIRECT 132.8 08/16/2007   LDLCALC 83 08/30/2012   ALT 11 08/29/2012   AST 18 08/29/2012   NA 137 03/27/2013   K 4.4 03/27/2013   CL 104 03/27/2013   CREATININE 0.72 03/27/2013   BUN 11 03/27/2013   CO2 26 03/27/2013   TSH 1.29 02/23/2013   INR 0.92 03/26/2013   MICROALBUR 0.4 02/11/2010   Transthoracic Echocardiography  Patient: Rakayla, Ricklefs MR #: [id number] Study Date: 03/27/2013 Gender: F Age: 14 Height: 149.9cm Weight: 59.4kg BSA: 1.68m^2 Pt. Status: Room: 2019  ATTENDING [place], Peter PERFORMING Corinda Gubler, [facility] Score SONOGRAPHER Georgian Co, RDCS, CCT cc:  ------------------------------------------------------------ LV EF: 60% - 65%  ------------------------------------------------------------ Indications: Atrial fibrillation - 427.31.  ------------------------------------------------------------ History: PMH: Angina pectoris. Hypertrophic cardiomyopathy. Risk factors: Palpitations. Hypertension.  ------------------------------------------------------------ Study Conclusions  - Left ventricle: The cavity size was mildly dilated. Wall thickness was increased in a pattern of moderate LVH. There was focal basal hypertrophy. Systolic function was normal. The estimated ejection fraction was in the range of 60% to 65%. Features are consistent with a pseudonormal left ventricular  filling pattern, with concomitant abnormal relaxation and increased filling pressure (grade 2 diastolic dysfunction). - Aortic valve: There was mild stenosis. Trivial regurgitation. Valve area: 1.28cm^2(VTI). Valve area: 1.2cm^2 (Vmax). - Mitral valve: Mildly to moderately calcified annulus. The findings are consistent with mild stenosis. Moderate regurgitation. Valve area by continuity equation (using LVOT flow): 1.5cm^2. - Left atrium: The atrium was mildly dilated. - Right atrium: The atrium was mildly dilated. - Pulmonary arteries: Systolic pressure was mildly to moderately increased. PA peak pressure: 52mm Hg (S). Transthoracic echocardiography. M-mode, complete 2D, spectral Doppler, and color Doppler. Height: Height: 149.9cm. Height: 59in. Weight: Weight: 59.4kg. Weight: 130.7lb. Body mass index: BMI: 26.5kg/m^2. Body surface area: BSA: 1.52m^2. Blood pressure: 142/51. Patient status: Inpatient. Location: Bedside.  Cardiac Catheterization Procedure Note  Name: JAXSON KEENER  MRN: [id number]  DOB: 08-Oct-1922  Procedure: Left Heart Cath, Selective Coronary Angiography, LV angiography  Indication: 79 yo WF presents with atrial fibrillation with RVR and a NSTEMI.  Procedural details: We initially accessed the right radial artery using the standard Seldinger technique. Despite good arterial access we were unable to access the right innominate because of marked tortuosity. The right groin was prepped, draped, and anesthetized with 1% lidocaine. Using modified Seldinger technique, a 5 French sheath was introduced into the right femoral artery. Standard Judkins catheters were used for coronary angiography and left ventriculography. Catheter exchanges were performed over a guidewire. There were no immediate procedural complications. The patient was transferred to the post catheterization recovery area for further monitoring.  Procedural Findings:  Hemodynamics:  AO 144/43 with a mean of  78 mmHg  LV 158/16 mmHg  Coronary angiography:  Coronary dominance: right  Left mainstem: Normal.  Left anterior descending (LAD): The left anterior descending artery is a small-caliber vessel that extends to the apex. It is diffusely diseased in the mid vessel up to 40%. The first diagonal ranch is moderate in length with small in caliber. There is a 99% stenosis proximally. The second diagonal is a smaller branch which has 70% stenosis at its origin.  Left circumflex (LCx): The left circumflex gives rise to a  very small first marginal branch and then a larger second marginal branch. There is a 50% stenosis in the mid vessel.  Right coronary artery (RCA): The right coronary is a large dominant vessel. It has scattered irregularities less than 20%.  Left ventriculography: Left ventricular systolic function is normal, there is mild mid anterior wall hypokinesis, LVEF is estimated at 65%, there is moderate mitral regurgitation  Final Conclusions:  1. Single vessel obstructive coronary disease. There is a critical stenosis in the first diagonal branch. This vessel is very small in diameter and not suitable for percutaneous intervention.  2. Normal LV function  Recommendations: Recommend medical therapy for her coronary disease   Assessment / Plan: 1. Paroxysmal atrial fibrillation. Based on her drug intolerances I think that she is on appropriate medication at this time. She is on rate control with diltiazem. If additional rate control as needed we could increase her diltiazem dose further. She has failed 2 antiarrhythmic drugs due to intolerance. She has always reverted back to sinus rhythm within 24 hours. Her episodes of atrial fib are infrequent. If she were to have more frequent or sustained episodes of atrial fibrillation a trial of Multaq would be a reasonable option. I'll followup again in 2 months.  2. Hypertension. Blood pressure is elevated today but the patient reports her readings at home  have been normal with typical systolic readings of 130 to 135. We will continue to monitor.  3. Hypertrophic cardiomyopathy. Recent echo showed more features of moderate LVH. She does have moderate mitral insufficiency.  4. Coronary disease with recent NSTEMI related to high-grade diagonal disease and atrial fibrillation. We'll treat medically.

## 2013-05-04 ENCOUNTER — Encounter (INDEPENDENT_AMBULATORY_CARE_PROVIDER_SITE_OTHER): Payer: Medicare Other | Admitting: Ophthalmology

## 2013-05-04 DIAGNOSIS — H3530 Unspecified macular degeneration: Secondary | ICD-10-CM

## 2013-05-04 DIAGNOSIS — H35039 Hypertensive retinopathy, unspecified eye: Secondary | ICD-10-CM

## 2013-05-04 DIAGNOSIS — I10 Essential (primary) hypertension: Secondary | ICD-10-CM

## 2013-05-04 DIAGNOSIS — H43819 Vitreous degeneration, unspecified eye: Secondary | ICD-10-CM

## 2013-05-10 ENCOUNTER — Encounter: Payer: Self-pay | Admitting: Radiology

## 2013-05-10 ENCOUNTER — Ambulatory Visit (INDEPENDENT_AMBULATORY_CARE_PROVIDER_SITE_OTHER): Payer: Medicare Other | Admitting: Family Medicine

## 2013-05-10 ENCOUNTER — Encounter: Payer: Self-pay | Admitting: Family Medicine

## 2013-05-10 VITALS — BP 130/60 | HR 69 | Temp 97.8°F | Wt 130.5 lb

## 2013-05-10 DIAGNOSIS — D649 Anemia, unspecified: Secondary | ICD-10-CM

## 2013-05-10 DIAGNOSIS — R110 Nausea: Secondary | ICD-10-CM

## 2013-05-10 DIAGNOSIS — G4700 Insomnia, unspecified: Secondary | ICD-10-CM

## 2013-05-10 LAB — CBC WITH DIFFERENTIAL/PLATELET
Basophils Relative: 0.3 % (ref 0.0–3.0)
Eosinophils Absolute: 0.1 10*3/uL (ref 0.0–0.7)
MCHC: 33.1 g/dL (ref 30.0–36.0)
MCV: 89.1 fl (ref 78.0–100.0)
Monocytes Absolute: 1 10*3/uL (ref 0.1–1.0)
Neutrophils Relative %: 63 % (ref 43.0–77.0)
RBC: 4.52 Mil/uL (ref 3.87–5.11)

## 2013-05-10 LAB — COMPREHENSIVE METABOLIC PANEL
AST: 121 U/L — ABNORMAL HIGH (ref 0–37)
Albumin: 3.5 g/dL (ref 3.5–5.2)
Alkaline Phosphatase: 212 U/L — ABNORMAL HIGH (ref 39–117)
BUN: 13 mg/dL (ref 6–23)
Potassium: 4.4 mEq/L (ref 3.5–5.1)
Sodium: 134 mEq/L — ABNORMAL LOW (ref 135–145)
Total Protein: 7 g/dL (ref 6.0–8.3)

## 2013-05-10 LAB — LIPASE: Lipase: 45 U/L (ref 11.0–59.0)

## 2013-05-10 MED ORDER — HYDROCODONE-ACETAMINOPHEN 5-325 MG PO TABS: 0.5000 | ORAL_TABLET | Freq: Two times a day (BID) | ORAL | Status: DC | PRN

## 2013-05-10 NOTE — Patient Instructions (Addendum)
Stop the lipitor for now.  Go to the lab on the way out.  We'll contact you with your lab report. If you don't improve, then let me know.  Take care.  Glad to see you.

## 2013-05-10 NOTE — Progress Notes (Signed)
She was admitted for NSTEMI with RVR.  She was continued on CCB. Prev cards notes reviewed.  She has had 1 episode in the last month with heart racing and it self resolved with rest.  No CP.  No SOB unless during the episode and this was mild.    She has been nauseated over the last few weeks.  She was started on lipitor recently, unclear if this was related.  She'll get nauseated after she eats (but not with every meal), and can last throughout the day.  No vomiting.  No blood in stool.  Ginger ale helps some. Drinking water can cause the sx. No changes in stools. She has known gall stones.  She has no RUQ abd pain.   She has had trouble sleeping recently, during the last month. She has trouble getting to asleep and then staying asleep.  Her mood is good. She doesn't drink caffeine.  She has been working on her roses and tomatoes in the yard recently.    H/o anemia noted, during the hospitalization.  Her weight is stable. No bleeding or bruising.    Meds, vitals, and allergies reviewed.   ROS: See HPI.  Otherwise, noncontributory.  nad ncat Mmm rrr with ectopy noted, murmur noted ctab abd soft Ext w/o edema

## 2013-05-11 ENCOUNTER — Other Ambulatory Visit: Payer: Self-pay | Admitting: Cardiology

## 2013-05-11 DIAGNOSIS — R110 Nausea: Secondary | ICD-10-CM | POA: Insufficient documentation

## 2013-05-11 NOTE — Assessment & Plan Note (Signed)
Will consider options after I see her labs.  She agrees.

## 2013-05-11 NOTE — Assessment & Plan Note (Signed)
Short trial off statin for now and she'll report back about her sx.  No other obvious cause noted.  The gall stones are likely asymptomatic as she has not RUQ pain.  Nad, okay for outpatient f/u. She agrees.

## 2013-05-17 ENCOUNTER — Ambulatory Visit (INDEPENDENT_AMBULATORY_CARE_PROVIDER_SITE_OTHER): Payer: Medicare Other | Admitting: Family Medicine

## 2013-05-17 ENCOUNTER — Encounter: Payer: Self-pay | Admitting: Family Medicine

## 2013-05-17 VITALS — BP 154/60 | HR 76 | Temp 97.6°F | Wt 130.8 lb

## 2013-05-17 DIAGNOSIS — R110 Nausea: Secondary | ICD-10-CM

## 2013-05-17 DIAGNOSIS — G4700 Insomnia, unspecified: Secondary | ICD-10-CM

## 2013-05-17 DIAGNOSIS — R7989 Other specified abnormal findings of blood chemistry: Secondary | ICD-10-CM

## 2013-05-17 NOTE — Patient Instructions (Signed)
Stay off the lipitor. Go to the lab on the way out.  We'll contact you with your lab report.  If the abdominal pain gets worse or more frequent, then let us know.  Take care.

## 2013-05-18 LAB — HEPATIC FUNCTION PANEL
ALT: 38 U/L — ABNORMAL HIGH (ref 0–35)
AST: 26 U/L (ref 0–37)
Albumin: 3.4 g/dL — ABNORMAL LOW (ref 3.5–5.2)
Alkaline Phosphatase: 128 U/L — ABNORMAL HIGH (ref 39–117)
Bilirubin, Direct: 0 mg/dL (ref 0.0–0.3)
Total Bilirubin: 0.6 mg/dL (ref 0.3–1.2)
Total Protein: 6.4 g/dL (ref 6.0–8.3)

## 2013-05-18 NOTE — Progress Notes (Signed)
Nausea resolved after stopping lipitor.  Started after starting lipitor prev.  This appears to be related.  D/w pt..  No vomiting.  Due for LFT f/u.  D/w pt.   She has known gallstones.  occ mild RUQ pain, limited, not after eating.  No pain now.  Discussed her prev GB findings on CT.   Insomnia continues.  Prev used ambien w/o ADE.  Other meds didn't help as much.  Asking about options. I'd like to see her LFTs first, discussed.   Meds, vitals, and allergies reviewed.   ROS: See HPI.  Otherwise, noncontributory.  nad ncat Mmm rrr ctab abd soft, not ttp, no rebound, normal BS Ext w/o edema

## 2013-05-18 NOTE — Assessment & Plan Note (Signed)
Prev used ambien w/o ADE.  Other meds didn't help as much.  Asking about options. I'd like to see her LFTs first, discussed.  See notes on labs.

## 2013-05-18 NOTE — Assessment & Plan Note (Signed)
Resolved off lipitor.  Stop statin.  Recheck LFTs today.  See notes on labs.  The gallstones may note be related.  I wouldn't w/u RUQ pain unless she has more sx.  She agrees.

## 2013-05-22 ENCOUNTER — Other Ambulatory Visit: Payer: Self-pay | Admitting: Family Medicine

## 2013-05-22 MED ORDER — ZOLPIDEM TARTRATE 10 MG PO TABS
5.0000 mg | ORAL_TABLET | Freq: Every evening | ORAL | Status: AC | PRN
Start: 2013-05-22 — End: 2013-06-21

## 2013-05-23 ENCOUNTER — Encounter: Payer: Self-pay | Admitting: Family Medicine

## 2013-05-25 ENCOUNTER — Encounter: Payer: Self-pay | Admitting: Family Medicine

## 2013-06-26 ENCOUNTER — Encounter: Payer: Self-pay | Admitting: Cardiology

## 2013-06-26 ENCOUNTER — Ambulatory Visit (INDEPENDENT_AMBULATORY_CARE_PROVIDER_SITE_OTHER): Payer: Medicare Other | Admitting: Cardiology

## 2013-06-26 VITALS — BP 140/58 | HR 81 | Ht 59.0 in | Wt 130.8 lb

## 2013-06-26 DIAGNOSIS — I4891 Unspecified atrial fibrillation: Secondary | ICD-10-CM

## 2013-06-26 DIAGNOSIS — I10 Essential (primary) hypertension: Secondary | ICD-10-CM

## 2013-06-26 DIAGNOSIS — I2510 Atherosclerotic heart disease of native coronary artery without angina pectoris: Secondary | ICD-10-CM

## 2013-06-26 NOTE — Progress Notes (Signed)
Cindy [name] Date of Birth: Nov 14, 1922 Medical Record #[id number]  History of Present Illness: [name] is seen for followup of atrial fibrillation. She has a history of non-ST elevation myocardial infarction the setting of rapid atrial fibrillation. She was noted to have disease in 2 diagonal branches that was treated medically. She has a history of intolerance to amiodarone and is not a candidate for Tikosyn due to prolonged QT. On followup today she complains of dyspnea all the time. She denies any significant orthopnea or PND. She denies any edema or chest pain. She is only noted palpitations for one or 2 occasions that lasted a few minutes. She reports intolerance to Lipitor which caused her to feel deathly sick with nausea vomiting, decreased appetite, and diffuse aches.  Current Outpatient Prescriptions on File Prior to Visit  Medication Sig Dispense Refill  . aspirin 81 MG chewable tablet Chew 1 tablet (81 mg total) by mouth daily.      Marland Kitchen BESIVANCE 0.6 % SUSP Place 1 drop into both eyes 2 (two) times daily.       Marland Kitchen diltiazem (CARDIZEM CD) 180 MG 24 hr capsule Take 1 capsule (180 mg total) by mouth daily.  90 capsule  3  . Docusate Sodium (DSS) 100 MG CAPS Take 100 mg by mouth 2 (two) times daily as needed (for constipation).       . fexofenadine (ALLEGRA) 180 MG tablet Take 180 mg by mouth daily as needed.      . fish oil-omega-3 fatty acids 1000 MG capsule Take 1 g by mouth 2 (two) times daily.        . hydrochlorothiazide (HYDRODIURIL) 25 MG tablet Take 12.5 mg by mouth daily.      Marland Kitchen HYDROcodone-acetaminophen (NORCO/VICODIN) 5-325 MG per tablet Take 0.5-1 tablets by mouth 2 (two) times daily as needed for pain.  90 tablet  1  . Iron-Vitamins (GERITOL PO) Take 1 tablet by mouth daily.      Marland Kitchen levothyroxine (SYNTHROID, LEVOTHROID) 25 MCG tablet Take 25 mcg by mouth every morning.      Marland Kitchen losartan (COZAAR) 50 MG tablet TAKE 1 TABLET DAILY  90 tablet  2  . Multiple Vitamins-Minerals  (OCUVITE PRESERVISION) TABS Take 1 tablet by mouth daily.        . nitroGLYCERIN (NITROSTAT) 0.4 MG SL tablet Place 1 tablet (0.4 mg total) under the tongue every 5 (five) minutes as needed for chest pain (up to 3 doses).  25 tablet  3  . Ranitidine HCl (ZANTAC PO) Take by mouth.       No current facility-administered medications on file prior to visit.    Allergies  Allergen Reactions  . Irbesartan     REACTION: swelling  . Lipitor (Atorvastatin)     LFT elevation, nausea  . Norpace (Disopyramide)     Dry mouth  . Ramipril     REACTION: lips swelling  . Telmisartan-Hctz     REACTION: incontinence  . Morphine And Related Other (See Comments)    "drives me crazy"    Past Medical History  Diagnosis Date  . Atrial fibrillation 04/23-24/2007    a. recurrent PAF with RVR in September 2013. b. Evaluated 03/2013, previously intolerant to Norpace and Amiodarone - consider Multaq if recurs. c. Not on anticoag due to history of falls and also some internal bleeding per son.  . Hypertrophic cardiomyopathy   . Urinary incontinence   . Diverticulosis of colon (without mention of hemorrhage) 2003/ 08/2000  EGD/colonoscopy Barretts esophagus//H.H divertics 08/2000  . Cervical mass     C2 lateral mass fracture  . Hypertension   . Hypercholesterolemia     219/497  . Hypothyroidism   . Multinodular goiter (nontoxic)   . Osteoporosis   . Blood transfusion   . Jaundice ~ 1935    "in grade school"  . Degenerative joint disease     back  . Depression     "husband died February 23, 2011"  . Anemia, iron deficiency   . Personal history of colonic polyps 02/29/2012    tubular adenoma  . Barrett's esophagus   . CAD (coronary artery disease)     a. NSTEMI 03/2013: secondary to diagonal disease (small, not amenable to PCI, for med rx).  . Moderate mitral regurgitation 2014  . Mild aortic stenosis 2014    Past Surgical History  Procedure Laterality Date  . Bladder surgery      bladder tack early  90's  . Tear duct probing  07/29/03    tear duct surg  . Cystourethroscopy  09/17/03  . Rotator cuff repair  ? date; 09/07/05    left; right( Dr. Darrelyn Hillock)  . Appendectomy  1941  . Breast surgery  1981    breast reduction  . Eye surgery  03/2002    cataract OS  . Thyroid ultrasound  10/14/2003    MNG, no dominant masses  . Doppler echocardiography  03/05/2002&09/11/2003    ECHO, EF wnl, mild stenosis, A.S. mild MR, Mild T.R03/31/2003//ECHO EF 70%,LVH, ?diast dysfunction 09/11/2003  . Cataract extraction w/ intraocular lens  implant, bilateral  2003  . Dilation and curettage of uterus  09/07/2000    endometrial polyps removed, path all benign   . Tonsillectomy and adenoidectomy      "as a child"  . Fracture surgery  2010    right knee    History  Smoking status  . Former Smoker -- 0.50 packs/day for 4 years  . Types: Cigarettes  . Quit date: 07/06/1974  Smokeless tobacco  . Never Used    History  Alcohol Use No    Family History  Problem Relation Age of Onset  . Heart failure Mother     CHF, DM, HBP  . Hypertension Mother   . Uterine cancer Mother   . Stroke Mother   . Colon cancer Neg Hx   . Esophageal cancer Neg Hx   . Rectal cancer Neg Hx   . Stomach cancer Neg Hx     Review of Systems: The review of systems is positive for chronic fatigue.  All other systems were reviewed and are negative.  Physical Exam: BP 140/58  Pulse 81  Ht 4\' 11"  (1.499 m)  Wt 130 lb 12.8 oz (59.33 kg)  BMI 26.4 kg/m2  SpO2 96% She is an elderly, anxious white female in no acute distress. HEENT: Normal. Neck is without jugular venous distention or bruits. There is no adenopathy or thyromegaly. Lungs are clear. Cardiovascular: Regular rate and rhythm. Grade 2/6 harsh systolic murmur the right upper sternal border. There is no S3. Abdomen: Soft and nontender. No masses or bruits. Extremities: No cyanosis or edema. Pedal pulses are 2+ and symmetric. Neuro: Alert and oriented x3.  Cranial nerves II through XII are intact. Mood is anxious. LABORATORY DATA: Lab Results  Component Value Date   WBC 8.1 05/10/2013   HGB 13.3 05/10/2013   HCT 40.3 05/10/2013   PLT 245.0 05/10/2013   GLUCOSE 92 05/10/2013   CHOL 160 08/30/2012  TRIG 77 08/30/2012   HDL 62 08/30/2012   LDLDIRECT 132.8 08/16/2007   LDLCALC 83 08/30/2012   ALT 38* 05/17/2013   AST 26 05/17/2013   NA 134* 05/10/2013   K 4.4 05/10/2013   CL 98 05/10/2013   CREATININE 0.8 05/10/2013   BUN 13 05/10/2013   CO2 27 05/10/2013   TSH 1.29 02/23/2013   INR 0.92 03/26/2013   MICROALBUR 0.4 02/11/2010   Transthoracic Echocardiography  Patient: Sarann, Tregre MR #: [id number] Study Date: 03/27/2013 Gender: F Age: 43 Height: 149.9cm Weight: 59.4kg BSA: 1.51m^2 Pt. Status: Room: 2019  ATTENDING [place], Peter PERFORMING Corinda Gubler, [facility] [place] Score SONOGRAPHER [place]ian Co, RDCS, CCT cc:  ------------------------------------------------------------ LV EF: 60% - 65%  ------------------------------------------------------------ Indications: Atrial fibrillation - 427.31.  ------------------------------------------------------------ History: PMH: Angina pectoris. Hypertrophic cardiomyopathy. Risk factors: Palpitations. Hypertension.  ------------------------------------------------------------ Study Conclusions  - Left ventricle: The cavity size was mildly dilated. Wall thickness was increased in a pattern of moderate LVH. There was focal basal hypertrophy. Systolic function was normal. The estimated ejection fraction was in the range of 60% to 65%. Features are consistent with a pseudonormal left ventricular filling pattern, with concomitant abnormal relaxation and increased filling pressure (grade 2 diastolic dysfunction). - Aortic valve: There was mild stenosis. Trivial regurgitation. Valve area: 1.28cm^2(VTI). Valve area: 1.2cm^2 (Vmax). - Mitral valve: Mildly to moderately calcified annulus.  The findings are consistent with mild stenosis. Moderate regurgitation. Valve area by continuity equation (using LVOT flow): 1.5cm^2. - Left atrium: The atrium was mildly dilated. - Right atrium: The atrium was mildly dilated. - Pulmonary arteries: Systolic pressure was mildly to moderately increased. PA peak pressure: 52mm Hg (S). Transthoracic echocardiography. M-mode, complete 2D, spectral Doppler, and color Doppler. Height: Height: 149.9cm. Height: 59in. Weight: Weight: 59.4kg. Weight: 130.7lb. Body mass index: BMI: 26.5kg/m^2. Body surface area: BSA: 1.42m^2. Blood pressure: 142/51. Patient status: Inpatient. Location: Bedside.  Cardiac Catheterization Procedure Note  Name: TAMAR MIANO  MRN: [id number]  DOB: 10/24/1922  Procedure: Left Heart Cath, Selective Coronary Angiography, LV angiography  Indication: 79 yo WF presents with atrial fibrillation with RVR and a NSTEMI.  Procedural details: We initially accessed the right radial artery using the standard Seldinger technique. Despite good arterial access we were unable to access the right innominate because of marked tortuosity. The right groin was prepped, draped, and anesthetized with 1% lidocaine. Using modified Seldinger technique, a 5 French sheath was introduced into the right femoral artery. Standard Judkins catheters were used for coronary angiography and left ventriculography. Catheter exchanges were performed over a guidewire. There were no immediate procedural complications. The patient was transferred to the post catheterization recovery area for further monitoring.  Procedural Findings:  Hemodynamics:  AO 144/43 with a mean of 78 mmHg  LV 158/16 mmHg  Coronary angiography:  Coronary dominance: right  Left mainstem: Normal.  Left anterior descending (LAD): The left anterior descending artery is a small-caliber vessel that extends to the apex. It is diffusely diseased in the mid vessel up to 40%. The first diagonal  ranch is moderate in length with small in caliber. There is a 99% stenosis proximally. The second diagonal is a smaller branch which has 70% stenosis at its origin.  Left circumflex (LCx): The left circumflex gives rise to a very small first marginal branch and then a larger second marginal branch. There is a 50% stenosis in the mid vessel.  Right coronary artery (RCA): The right coronary is a large dominant vessel. It has scattered irregularities less  than 20%.  Left ventriculography: Left ventricular systolic function is normal, there is mild mid anterior wall hypokinesis, LVEF is estimated at 65%, there is moderate mitral regurgitation  Final Conclusions:  1. Single vessel obstructive coronary disease. There is a critical stenosis in the first diagonal branch. This vessel is very small in diameter and not suitable for percutaneous intervention.  2. Normal LV function  Recommendations: Recommend medical therapy for her coronary disease   Assessment / Plan: 1. Paroxysmal atrial fibrillation. Based on her drug intolerances I think that she is on appropriate medication at this time. She is on rate control with diltiazem. If additional rate control as needed we could increase her diltiazem dose further. She has failed 2 antiarrhythmic drugs due to intolerance. She has always reverted back to sinus rhythm within 24 hours. Her episodes of atrial fib are infrequent.  2. Hypertension. Blood pressure is controlled at today. Continue current medications.  3. Hypertrophic cardiomyopathy. Recent echo showed more features of moderate LVH. She does have moderate mitral insufficiency.  4. Coronary disease with recent NSTEMI related to high-grade diagonal disease and atrial fibrillation. We'll treat medically.  5. Hyperlipidemia. Intolerant to statin therapy. Recommend dietary modification.

## 2013-06-26 NOTE — Patient Instructions (Signed)
Continue your current therapy  I will see you in 6 months.   

## 2013-06-29 ENCOUNTER — Encounter (INDEPENDENT_AMBULATORY_CARE_PROVIDER_SITE_OTHER): Payer: Medicare Other | Admitting: Ophthalmology

## 2013-06-29 DIAGNOSIS — H353290 Exudative age-related macular degeneration, unspecified eye, stage unspecified: Secondary | ICD-10-CM

## 2013-06-29 DIAGNOSIS — H3530 Unspecified macular degeneration: Secondary | ICD-10-CM

## 2013-06-29 DIAGNOSIS — I10 Essential (primary) hypertension: Secondary | ICD-10-CM

## 2013-06-29 DIAGNOSIS — H35039 Hypertensive retinopathy, unspecified eye: Secondary | ICD-10-CM

## 2013-06-29 DIAGNOSIS — H43819 Vitreous degeneration, unspecified eye: Secondary | ICD-10-CM

## 2013-08-13 ENCOUNTER — Emergency Department (HOSPITAL_COMMUNITY)
Admission: EM | Admit: 2013-08-13 | Discharge: 2013-08-13 | Disposition: A | Discharge status: Home / Self Care | Payer: Medicare Other | Attending: Emergency Medicine | Admitting: Emergency Medicine

## 2013-08-13 ENCOUNTER — Encounter (HOSPITAL_COMMUNITY): Payer: Self-pay | Admitting: Emergency Medicine

## 2013-08-13 DIAGNOSIS — Z862 Personal history of diseases of the blood and blood-forming organs and certain disorders involving the immune mechanism: Secondary | ICD-10-CM | POA: Insufficient documentation

## 2013-08-13 DIAGNOSIS — Z860100 Personal history of colon polyps, unspecified: Secondary | ICD-10-CM | POA: Insufficient documentation

## 2013-08-13 DIAGNOSIS — Z5189 Encounter for other specified aftercare: Secondary | ICD-10-CM | POA: Insufficient documentation

## 2013-08-13 DIAGNOSIS — R5381 Other malaise: Secondary | ICD-10-CM | POA: Insufficient documentation

## 2013-08-13 DIAGNOSIS — K5730 Diverticulosis of large intestine without perforation or abscess without bleeding: Secondary | ICD-10-CM

## 2013-08-13 DIAGNOSIS — R0602 Shortness of breath: Secondary | ICD-10-CM | POA: Insufficient documentation

## 2013-08-13 DIAGNOSIS — Z79899 Other long term (current) drug therapy: Secondary | ICD-10-CM | POA: Insufficient documentation

## 2013-08-13 DIAGNOSIS — I2510 Atherosclerotic heart disease of native coronary artery without angina pectoris: Secondary | ICD-10-CM | POA: Insufficient documentation

## 2013-08-13 DIAGNOSIS — Z8781 Personal history of (healed) traumatic fracture: Secondary | ICD-10-CM | POA: Insufficient documentation

## 2013-08-13 DIAGNOSIS — Z7982 Long term (current) use of aspirin: Secondary | ICD-10-CM | POA: Insufficient documentation

## 2013-08-13 DIAGNOSIS — K625 Hemorrhage of anus and rectum: Secondary | ICD-10-CM | POA: Insufficient documentation

## 2013-08-13 DIAGNOSIS — Z87891 Personal history of nicotine dependence: Secondary | ICD-10-CM | POA: Insufficient documentation

## 2013-08-13 DIAGNOSIS — K921 Melena: Secondary | ICD-10-CM | POA: Insufficient documentation

## 2013-08-13 DIAGNOSIS — Z8739 Personal history of other diseases of the musculoskeletal system and connective tissue: Secondary | ICD-10-CM | POA: Insufficient documentation

## 2013-08-13 DIAGNOSIS — I4891 Unspecified atrial fibrillation: Secondary | ICD-10-CM | POA: Insufficient documentation

## 2013-08-13 DIAGNOSIS — R011 Cardiac murmur, unspecified: Secondary | ICD-10-CM | POA: Insufficient documentation

## 2013-08-13 DIAGNOSIS — Z8742 Personal history of other diseases of the female genital tract: Secondary | ICD-10-CM | POA: Insufficient documentation

## 2013-08-13 DIAGNOSIS — Z8659 Personal history of other mental and behavioral disorders: Secondary | ICD-10-CM | POA: Insufficient documentation

## 2013-08-13 DIAGNOSIS — E039 Hypothyroidism, unspecified: Secondary | ICD-10-CM | POA: Insufficient documentation

## 2013-08-13 DIAGNOSIS — I10 Essential (primary) hypertension: Secondary | ICD-10-CM | POA: Insufficient documentation

## 2013-08-13 LAB — CBC WITH DIFFERENTIAL/PLATELET
Eosinophils Relative: 0 % (ref 0–5)
HCT: 35.2 % — ABNORMAL LOW (ref 36.0–46.0)
Lymphocytes Relative: 18 % (ref 12–46)
Lymphs Abs: 1.3 10*3/uL (ref 0.7–4.0)
MCV: 86.7 fL (ref 78.0–100.0)
Monocytes Absolute: 1 10*3/uL (ref 0.1–1.0)
Platelets: 220 10*3/uL (ref 150–400)
RBC: 4.06 MIL/uL (ref 3.87–5.11)
WBC: 7.3 10*3/uL (ref 4.0–10.5)

## 2013-08-13 LAB — COMPREHENSIVE METABOLIC PANEL
ALT: 10 U/L (ref 0–35)
CO2: 24 mEq/L (ref 19–32)
Calcium: 8.3 mg/dL — ABNORMAL LOW (ref 8.4–10.5)
GFR calc Af Amer: 88 mL/min — ABNORMAL LOW (ref >90)
GFR calc non Af Amer: 76 mL/min — ABNORMAL LOW (ref >90)
Glucose, Bld: 99 mg/dL (ref 70–99)
Sodium: 134 mEq/L — ABNORMAL LOW (ref 135–145)
Total Bilirubin: 0.5 mg/dL (ref 0.3–1.2)

## 2013-08-13 LAB — APTT: aPTT: 28 seconds (ref 24–37)

## 2013-08-13 LAB — POCT I-STAT TROPONIN I: Troponin i, poc: 0.06 ng/mL (ref 0.00–0.08)

## 2013-08-13 LAB — TYPE AND SCREEN: DAT, IgG: NEGATIVE

## 2013-08-13 MED ORDER — SODIUM CHLORIDE 0.9 % IV SOLN
Freq: Once | INTRAVENOUS | Status: AC
  Administered 2013-08-13: 17:00:00 via INTRAVENOUS

## 2013-08-13 NOTE — ED Notes (Signed)
Pt c/o rectal bleeding x1 day.  Reports she noticed it this afternoon.  Reports "tarry stool".  Denies any other complaints.

## 2013-08-13 NOTE — ED Provider Notes (Signed)
CSN: [id number]     Arrival date & time 08/13/13  1450 History   First MD Initiated Contact with Patient 08/13/13 1525     Chief Complaint  Patient presents with  . Rectal Bleeding   HPI Pt is a 79 y/o female with PMHx significant for Paroxysmal Atrial Fibrillation rate controlled on Diltiazem ,CAD and NSTEMI 03/2013, HTN, Hypertrophic Cardiomyopathy, HLD intolerant of statin, Iron deficiency anemia, tubular adenoma and severe diverticulosis with last colonoscopy in 03/2012 followed by Dr. Arlyce Dice, who presents today with melanotic stools x 2.  Pt states that she has noticed she has had increased weakness over the past couple of days leading up until this AM.  She woke up, had a BM that was dark in nature but attributed it to her diet, denying tenesmus at that time.  However, she continued her day leading to lunch at which time she had the urge for another bowel movement at which point she had an episode of dark brown tarry stools with small evidence of blood.  At this point, she decided to come to the ED.  She does attribute some ongoing generalized weakness for the last several months that has worsened over the last 2-3 days, but denies any falls, LOC, unsteady gait.  She is currently on ASA 325 mg qd for her Atrial Fibrillation as her coumadin was stopped due to fall risk.    At this point, she denies chest pain, palpitations, leg swelling, orthopnea, PND, dizziness, lightheadedness, worsening blurred vision/diplopia, tinnitus, N/V/D, abdominal pain, difficulty swallowing, paresthesias, one sided hemiparesis, sensory changes, fever, chills, sweats, recent travel, sick contacts, productive cough.    Past Medical History  Diagnosis Date  . Atrial fibrillation 04/23-24/2007    a. recurrent PAF with RVR in September 2013. b. Evaluated 03/2013, previously intolerant to Norpace and Amiodarone - consider Multaq if recurs. c. Not on anticoag due to history of falls and also some internal bleeding per son.  .  Hypertrophic cardiomyopathy   . Urinary incontinence   . Diverticulosis of colon (without mention of hemorrhage) 2003/ 08/2000    EGD/colonoscopy Barretts esophagus//H.H divertics 08/2000  . Cervical mass     C2 lateral mass fracture  . Hypertension   . Hypercholesterolemia     219/497  . Hypothyroidism   . Multinodular goiter (nontoxic)   . Osteoporosis   . Blood transfusion   . Jaundice ~ 1935    "in grade school"  . Degenerative joint disease     back  . Depression     "husband died 23-Feb-2011"  . Anemia, iron deficiency   . Personal history of colonic polyps 02/29/2012    tubular adenoma  . Barrett's esophagus   . CAD (coronary artery disease)     a. NSTEMI 03/2013: secondary to diagonal disease (small, not amenable to PCI, for med rx).  . Moderate mitral regurgitation 2014  . Mild aortic stenosis 2014   Past Surgical History  Procedure Laterality Date  . Bladder surgery      bladder tack early 90's  . Tear duct probing  07/29/03    tear duct surg  . Cystourethroscopy  09/17/03  . Rotator cuff repair  ? date; 09/07/05    left; right( Dr. Darrelyn Hillock)  . Appendectomy  1941  . Breast surgery  1981    breast reduction  . Eye surgery  03/2002    cataract OS  . Thyroid ultrasound  10/14/2003    MNG, no dominant masses  . Doppler echocardiography  03/05/2002&09/11/2003    ECHO, EF wnl, mild stenosis, A.S. mild MR, Mild T.R03/31/2003//ECHO EF 70%,LVH, ?diast dysfunction 09/11/2003  . Cataract extraction w/ intraocular lens  implant, bilateral  2003  . Dilation and curettage of uterus  09/07/2000    endometrial polyps removed, path all benign   . Tonsillectomy and adenoidectomy      "as a child"  . Fracture surgery  2010    right knee   Family History  Problem Relation Age of Onset  . Heart failure Mother     CHF, DM, HBP  . Hypertension Mother   . Uterine cancer Mother   . Stroke Mother   . Colon cancer Neg Hx   . Esophageal cancer Neg Hx   . Rectal cancer Neg Hx   .  Stomach cancer Neg Hx    History  Substance Use Topics  . Smoking status: Former Smoker -- 0.50 packs/day for 4 years    Types: Cigarettes    Quit date: 07/06/1974  . Smokeless tobacco: Never Used  . Alcohol Use: No   OB History   Grav Para Term Preterm Abortions TAB SAB Ect Mult Living                 Review of Systems  Constitutional: Positive for fatigue. Negative for fever, chills, activity change, appetite change and unexpected weight change.  HENT: Negative.   Eyes: Negative for photophobia, pain and visual disturbance.  Respiratory: Positive for shortness of breath. Negative for cough, chest tightness and wheezing.   Cardiovascular: Negative.   Gastrointestinal: Positive for blood in stool and anal bleeding. Negative for nausea, vomiting, abdominal pain, diarrhea, constipation, abdominal distention and rectal pain.  Endocrine: Negative.   Genitourinary: Negative.   Musculoskeletal: Negative.   Skin: Negative.   Allergic/Immunologic: Negative.   Neurological: Negative.   Hematological: Negative.   Psychiatric/Behavioral: Negative.     Allergies  Irbesartan; Lipitor; Norpace; Ramipril; Telmisartan-hctz; and Morphine and related  Home Medications   Current Outpatient Rx  Name  Route  Sig  Dispense  Refill  . aspirin 81 MG chewable tablet   Oral   Chew 1 tablet (81 mg total) by mouth daily.         Marland Kitchen BESIVANCE 0.6 % SUSP   Both Eyes   Place 1 drop into both eyes 2 (two) times daily.          Marland Kitchen diltiazem (CARDIZEM CD) 180 MG 24 hr capsule   Oral   Take 1 capsule (180 mg total) by mouth daily.   90 capsule   3   . Docusate Sodium (DSS) 100 MG CAPS   Oral   Take 100 mg by mouth 2 (two) times daily as needed (for constipation).          . fexofenadine (ALLEGRA) 180 MG tablet   Oral   Take 180 mg by mouth daily as needed.         . fish oil-omega-3 fatty acids 1000 MG capsule   Oral   Take 1 g by mouth 2 (two) times daily.           .  hydrochlorothiazide (HYDRODIURIL) 25 MG tablet   Oral   Take 12.5 mg by mouth daily.         Marland Kitchen HYDROcodone-acetaminophen (NORCO/VICODIN) 5-325 MG per tablet   Oral   Take 0.5-1 tablets by mouth 2 (two) times daily as needed for pain.   90 tablet   1   . Iron-Vitamins (GERITOL  PO)   Oral   Take 1 tablet by mouth daily.         Marland Kitchen levothyroxine (SYNTHROID, LEVOTHROID) 25 MCG tablet   Oral   Take 25 mcg by mouth every morning.         Marland Kitchen losartan (COZAAR) 50 MG tablet      TAKE 1 TABLET DAILY   90 tablet   2   . Multiple Vitamins-Minerals (OCUVITE PRESERVISION) TABS   Oral   Take 1 tablet by mouth daily.           . nitroGLYCERIN (NITROSTAT) 0.4 MG SL tablet   Sublingual   Place 1 tablet (0.4 mg total) under the tongue every 5 (five) minutes as needed for chest pain (up to 3 doses).   25 tablet   3   . Ranitidine HCl (ZANTAC PO)   Oral   Take by mouth.          BP 163/94  Pulse 76  Temp(Src) 98 F (36.7 C) (Oral)  Resp 14  SpO2 99% Physical Exam  Constitutional: She is oriented to person, place, and time. Vital signs are normal. She appears well-developed and well-nourished. No distress.  HENT:  Head: Normocephalic and atraumatic.  Nose: Nose normal.  Mouth/Throat: Uvula is midline. Mucous membranes are not pale, dry and not cyanotic. No posterior oropharyngeal edema.  Eyes: Conjunctivae and EOM are normal. Pupils are equal, round, and reactive to light.  Neck: Trachea normal. No hepatojugular reflux and no JVD present.  Cardiovascular: Normal pulses.  An irregularly irregular rhythm present.  Murmur heard.  Systolic murmur is present with a grade of 2/6  +2/6 SEM RUSB, +2/6 systolic murmur at apex  Pulmonary/Chest: Effort normal and breath sounds normal.  Abdominal: Soft. Normal appearance and bowel sounds are normal. She exhibits no distension. There is no tenderness. There is no rebound.  Lymphadenopathy:    She has no cervical adenopathy.   Neurological: She is alert and oriented to person, place, and time. She has normal strength and normal reflexes. No cranial nerve deficit or sensory deficit. GCS eye subscore is 4. GCS verbal subscore is 5. GCS motor subscore is 6.  Skin: Skin is warm, dry and intact. She is not diaphoretic. No cyanosis. No pallor. Nails show no clubbing.  + skin tenting 2-3 seconds  Psychiatric: She has a normal mood and affect. Her speech is normal.  Rectal Exam: No gross blood  ED Course  Procedures (including critical care time)  Date: 08/13/2013  Rate: 80  Rhythm: Wandering Pacemaker   QRS Axis: normal  Intervals: normal  ST/T Wave abnormalities: normal  Conduction Disutrbances: none  Narrative Interpretation: Wandering Atrial Pacemaker, LVH  Old EKG Reviewed: No significant changes noted  Labs Review Labs Reviewed  CBC WITH DIFFERENTIAL - Abnormal; Notable for the following:    Hemoglobin 11.4 (*)    HCT 35.2 (*)    Monocytes Relative 14 (*)    All other components within normal limits  COMPREHENSIVE METABOLIC PANEL - Abnormal; Notable for the following:    Sodium 134 (*)    Calcium 8.3 (*)    Albumin 3.0 (*)    GFR calc non Af Amer 76 (*)    GFR calc Af Amer 88 (*)    All other components within normal limits  PROTIME-INR  APTT  OCCULT BLOOD X 1 CARD TO LAB, STOOL  POCT I-STAT TROPONIN I  TYPE AND SCREEN   Imaging Review No results found.  MDM  No diagnosis found. Pt w/ melanotic stool x 2, concern for UGIB.  She did have colonoscopy in 2013 showing severe diverticulosis, which could be cause of her bleeding.  She is not on any NSAID or blood thinners other than ASA 325 mg qd, which she has been taking for quite sometime and denies any recent changes to her diet.  Will get EKG, POC troponin due to her somewhat worsened shortness of breath, considering CXR as well.  Will get CBC, CMP, hemoccult, PT/INR, aPTT, and type and screen in case pt is actively bleeding.  Will start NS @ 100  cc/hr, with consideration of NS bolus.    5:09 PM - EKG reviewed and unchanged from previous.  CBC showing Hgb  11, vitals stable as well.  Will await POC troponin as well as Hemoccult.    6:14 PM - Pt Hemoccult +, Hgb drop from 13.2 to 11 since June 5th, hemodynamically stable.  Discussed with Dr. Christella Hartigan, On call GI doctor for [place], who recommended either admission or f/u within the next 1-2 days as outpatient, leaving the decision up to the patient.  Discussed risks and benefits of staying in the hospital vs going home with outpatient f/u and pt will discuss with son, coming to a decision.    6:29 PM- Discussed case with son and pt again, and they would like to go home.  They understand risks of going home and instructed on when to come back.  Will f/u with [facility] in next 1-3 days.    Twana First Paulina Fusi, DO of [facility] 08/13/2013, 6:29 PM  Briscoe Deutscher, DO 08/13/13 1829

## 2013-08-14 ENCOUNTER — Telehealth: Payer: Self-pay | Admitting: Gastroenterology

## 2013-08-14 NOTE — ED Provider Notes (Signed)
I saw and evaluated the patient, reviewed the resident's note and I agree with the findings and plan.   .Face to face Exam:  General:  Awake HEENT:  Atraumatic Resp:  Normal effort Abd:  Nondistended Neuro:No focal weakness   Nelia Shi, MD 08/14/13 [phone]

## 2013-08-14 NOTE — Telephone Encounter (Signed)
Pt was seen in the ER with rectal bleeding last night and told to follow-up in a day or two. Pt scheduled to see Dr. Arlyce Dice 08/15/13@10 :30am. Pts son aware of appt date and time.

## 2013-08-15 ENCOUNTER — Other Ambulatory Visit (INDEPENDENT_AMBULATORY_CARE_PROVIDER_SITE_OTHER): Payer: Medicare Other

## 2013-08-15 ENCOUNTER — Ambulatory Visit (INDEPENDENT_AMBULATORY_CARE_PROVIDER_SITE_OTHER): Payer: Medicare Other | Admitting: Gastroenterology

## 2013-08-15 ENCOUNTER — Encounter: Payer: Self-pay | Admitting: Gastroenterology

## 2013-08-15 VITALS — BP 150/52 | HR 68 | Ht 59.0 in | Wt 133.2 lb

## 2013-08-15 DIAGNOSIS — K921 Melena: Secondary | ICD-10-CM

## 2013-08-15 LAB — CBC WITH DIFFERENTIAL/PLATELET
Basophils Absolute: 0 10*3/uL (ref 0.0–0.1)
Eosinophils Relative: 1.1 % (ref 0.0–5.0)
HCT: 36.5 % (ref 36.0–46.0)
Lymphocytes Relative: 24.5 % (ref 12.0–46.0)
Monocytes Relative: 10.4 % (ref 3.0–12.0)
Neutrophils Relative %: 63.7 % (ref 43.0–77.0)
Platelets: 257 10*3/uL (ref 150.0–400.0)
WBC: 8.8 10*3/uL (ref 4.5–10.5)

## 2013-08-15 MED ORDER — OMEPRAZOLE 40 MG PO CPDR: 40.0000 mg | DELAYED_RELEASE_CAPSULE | Freq: Every day | ORAL | Status: DC

## 2013-08-15 NOTE — Patient Instructions (Addendum)
Go to the basement for labs today  You have been scheduled for an endoscopy with propofol. Please follow written instructions given to you at your visit today. If you use inhalers (even only as needed), please bring them with you on the day of your procedure. Your physician has requested that you go to www.[email or web address].com and enter the access code given to you at your visit today. This web site gives a general overview about your procedure. However, you should still follow specific instructions given to you by our office regarding your preparation for the procedure. 

## 2013-08-15 NOTE — Assessment & Plan Note (Signed)
Acute GI bleed that clinically is resolving.  She's had a 2 g drop in hemoglobin.  Bleeding from upper GI bleeding source such as ulcers should be ruled out.  She may also have bled from a diverticulum.  Recommendations #1 check CBC #2 upper endoscopy #3 discontinue ranitidine and begin omeprazole 40 mg daily

## 2013-08-15 NOTE — Progress Notes (Signed)
History of Present Illness:  Mrs. Coreas was seen in the ER 2 days ago because of melena with spots of blood.  A 2 g drop in hemoglobin was noted.  Yesterday she did did not have a bowel movement.  Today she had a small brownish stool.  She's on no gastric irritants including nonsteroidals.  She takes ranitidine daily.  Endoscopy in March, 2013 demonstrated Barrett's esophagus.  Colonoscopy on the same date demonstrated 2 adenomatous polyps which were removed.  Severe sigmoid diverticulosis was noted.    Review of Systems: She complains of joint pains and stiffness Pertinent positive and negative review of systems were noted in the above HPI section. All other review of systems were otherwise negative.    Current Medications, Allergies, Past Medical History, Past Surgical History, Family History and Social History were reviewed in [employer] electronic medical record  Vital signs were reviewed in today's medical record. Physical Exam: General: Well developed , well nourished, no acute distress Skin: anicteric Head: Normocephalic and atraumatic Eyes:  sclerae anicteric, EOMI Ears: Normal auditory acuity Mouth: No deformity or lesions Lungs: Clear throughout to auscultation Heart: Regular rate and rhythm; norubs or bruits Abdomen: Soft, non tender and non distended. No masses, hepatosplenomegaly or hernias noted. Normal Bowel sounds.  There is a 2-3/6 harsh systolic murmur heard best at the second right intercostal space Rectal:deferred Musculoskeletal: Symmetrical with no gross deformities  Pulses:  Normal pulses noted Extremities: No clubbing, cyanosis, edema or deformities noted Neurological: Alert oriented x 4, grossly nonfocal Psychological:  Alert and cooperative. Normal mood and affect

## 2013-08-22 ENCOUNTER — Ambulatory Visit (INDEPENDENT_AMBULATORY_CARE_PROVIDER_SITE_OTHER): Payer: Medicare Other | Admitting: Internal Medicine

## 2013-08-22 ENCOUNTER — Encounter: Payer: Self-pay | Admitting: Internal Medicine

## 2013-08-22 VITALS — HR 69 | Temp 98.2°F | Wt 131.8 lb

## 2013-08-22 DIAGNOSIS — S5012XA Contusion of left forearm, initial encounter: Secondary | ICD-10-CM

## 2013-08-22 DIAGNOSIS — S5010XA Contusion of unspecified forearm, initial encounter: Secondary | ICD-10-CM

## 2013-08-22 NOTE — Patient Instructions (Signed)
Hematoma A hematoma is a pocket of blood that collects under the skin, in an organ, in a body space, in a joint space, or in other tissue. The blood can clot to form a lump that you can see and feel. The lump is often firm, sore, and sometimes even painful and tender. Most hematomas get better in a few days to weeks. However, some hematomas may be serious and require medical care.Hematomas can range in size from very small to very large. CAUSES  A hematoma can be caused by a blunt or penetrating injury. It can also be caused by leakage from a blood vessel under the skin. Spontaneous leakage from a blood vessel is more likely to occur in elderly people, especially those taking blood thinners. Sometimes, a hematoma can develop after certain medical procedures. SYMPTOMS  Unlike a bruise, a hematoma forms a firm lump that you can feel. This lump is the collection of blood. The collection of blood can also cause your skin to turn a blue to dark blue color. If the hematoma is close to the surface of the skin, it often produces a yellowish color in the skin. DIAGNOSIS  Your caregiver can determine whether you have a hematoma based on your history and a physical exam. TREATMENT  Hematomas usually go away on their own over time. Rarely does the blood need to be drained out of the body. HOME CARE INSTRUCTIONS   Put ice on the injured area.  Put ice in a plastic bag.  Place a towel between your skin and the bag.  Leave the ice on for 15-20 minutes, 3-4 times a day for the first 1 to 2 days.  After the first 2 days, switch to using warm compresses on the hematoma.  Elevate the injured area to help decrease pain and swelling. Wrapping the area with an elastic bandage may also be helpful. Compression helps to reduce swelling and promotes shrinking of the hematoma. Make sure the bandage is not wrapped too tight.  If your hematoma is on a lower extremity and is painful, crutches may be helpful for a couple  days.  Only take over-the-counter or prescription medicines for pain, discomfort, or fever as directed by your caregiver. Most patients can take acetaminophen or ibuprofen for the pain. SEEK IMMEDIATE MEDICAL CARE IF:   You have increasing pain, or your pain is not controlled with medicine.  You have a fever.  You have worsening swelling or discoloration.  Your skin over the hematoma breaks or starts bleeding. MAKE SURE YOU:   Understand these instructions.  Will watch your condition.  Will get help right away if you are not doing well or get worse. Document Released: 07/06/2004 Document Revised: 02/14/2012 Document Reviewed: 07/26/2011 Exit[employer] Patient Information 2014 [employer].  

## 2013-08-22 NOTE — Progress Notes (Signed)
Subjective:    Patient ID: Cindy [name], female    DOB: 07/31/1922, 79 y.o.   MRN: [id number]  HPI  Pt presents to the clinic today with c/o a knot on her left forearm where she had blood drawn 7 days ago. There is some bruising surrounding the knot. She is concerned that she has a blood clot. It it not warm or tender. She just noticed it last night. She is on aspirin daily.  Review of Systems      Past Medical History  Diagnosis Date  . Atrial fibrillation 04/23-24/2007    a. recurrent PAF with RVR in September 2013. b. Evaluated 03/2013, previously intolerant to Norpace and Amiodarone - consider Multaq if recurs. c. Not on anticoag due to history of falls and also some internal bleeding per son.  . Hypertrophic cardiomyopathy   . Urinary incontinence   . Diverticulosis of colon (without mention of hemorrhage) 2003/ 08/2000    EGD/colonoscopy Barretts esophagus//H.H divertics 08/2000  . Cervical mass     C2 lateral mass fracture  . Hypertension   . Hypercholesterolemia     219/497  . Hypothyroidism   . Multinodular goiter (nontoxic)   . Osteoporosis   . Blood transfusion   . Jaundice ~ 1935    "in grade school"  . Degenerative joint disease     back  . Depression     "husband died Feb 21, 2011"  . Anemia, iron deficiency   . Personal history of colonic polyps 02/29/2012    tubular adenoma  . Barrett's esophagus   . CAD (coronary artery disease)     a. NSTEMI 03/2013: secondary to diagonal disease (small, not amenable to PCI, for med rx).  . Moderate mitral regurgitation 2014  . Mild aortic stenosis 2014    Current Outpatient Prescriptions  Medication Sig Dispense Refill  . aspirin 81 MG chewable tablet Chew 1 tablet (81 mg total) by mouth daily.      Marland Kitchen BESIVANCE 0.6 % SUSP Place 1 drop into both eyes 2 (two) times daily.       Marland Kitchen diltiazem (CARDIZEM CD) 180 MG 24 hr capsule Take 1 capsule (180 mg total) by mouth daily.  90 capsule  3  . fexofenadine (ALLEGRA) 180 MG  tablet Take 180 mg by mouth daily.       . fish oil-omega-3 fatty acids 1000 MG capsule Take 1 g by mouth 2 (two) times daily.        . hydrochlorothiazide (HYDRODIURIL) 25 MG tablet Take 12.5 mg by mouth daily. Take 1/2 tablet      . HYDROcodone-acetaminophen (NORCO/VICODIN) 5-325 MG per tablet Take 0.5-1 tablets by mouth 2 (two) times daily as needed for pain.  90 tablet  1  . levothyroxine (SYNTHROID, LEVOTHROID) 25 MCG tablet Take 25 mcg by mouth every morning.      Marland Kitchen losartan (COZAAR) 50 MG tablet TAKE 1 TABLET DAILY  90 tablet  2  . Multiple Vitamins-Minerals (OCUVITE PRESERVISION) TABS Take 1 tablet by mouth daily.        . nitroGLYCERIN (NITROSTAT) 0.4 MG SL tablet Place 1 tablet (0.4 mg total) under the tongue every 5 (five) minutes as needed for chest pain (up to 3 doses).  25 tablet  3  . omeprazole (PRILOSEC) 40 MG capsule Take 1 capsule (40 mg total) by mouth daily.  30 capsule  3   No current facility-administered medications for this visit.    Allergies  Allergen Reactions  . Irbesartan  REACTION: swelling  . Lipitor [Atorvastatin]     LFT elevation, nausea  . Norpace [Disopyramide]     Dry mouth  . Ramipril     REACTION: lips swelling  . Telmisartan-Hctz     REACTION: incontinence  . Morphine And Related Other (See Comments)    "drives me crazy"    Family History  Problem Relation Age of Onset  . Heart failure Mother     CHF, DM, HBP  . Hypertension Mother   . Uterine cancer Mother   . Stroke Mother   . Colon cancer Neg Hx   . Esophageal cancer Neg Hx   . Rectal cancer Neg Hx   . Stomach cancer Neg Hx     History   Social History  . Marital Status: Married    Spouse Name: N/A    Number of Children: 5  . Years of Education: N/A   Occupational History  . retired    Social History Main Topics  . Smoking status: Former Smoker -- 0.50 packs/day for 4 years    Types: Cigarettes    Quit date: 07/06/1974  . Smokeless tobacco: Never Used  . Alcohol  Use: No  . Drug Use: No  . Sexual Activity: No   Other Topics Concern  . 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   Skin: Pt reports knot and bruising on left forearm. Denies rashes, lesions or ulcercations.    No other specific complaints in a complete review of systems (except as listed in HPI above).  Objective:   Physical Exam  Pulse 69  Temp(Src) 98.2 F (36.8 C) (Oral)  Wt 131 lb 12 oz (59.761 kg)  BMI 26.6 kg/m2  SpO2 97% Wt Readings from Last 3 Encounters:  08/22/13 131 lb 12 oz (59.761 kg)  08/15/13 133 lb 4 oz (60.442 kg)  06/26/13 130 lb 12.8 oz (59.33 kg)    General: Appears her stated age, well developed, well nourished in NAD. Skin: Warm, dry and intact. No rashes, lesions or ulcerations noted. Small pea sized not noted on left forearm surrounded by dark purple ecchymosis.  Cardiovascular: Normal rate and rhythm. S1,S2 noted.  No murmur, rubs or gallops noted. No JVD or BLE edema. No carotid bruits noted. Radial pulsed 2+, cap refill 3-4 secs bilaterally. Pulmonary/Chest: Normal effort and positive vesicular breath sounds. No respiratory distress. No wheezes, rales or ronchi noted.    BMET    Component Value Date/Time   NA 134* 08/13/2013 1600   K 3.8 08/13/2013 1600   CL 101 08/13/2013 1600   CO2 24 08/13/2013 1600   GLUCOSE 99 08/13/2013 1600   BUN 19 08/13/2013 1600   CREATININE 0.67 08/13/2013 1600   CALCIUM 8.3* 08/13/2013 1600   GFRNONAA 76* 08/13/2013 1600   GFRAA 88* 08/13/2013 1600    Lipid Panel     Component Value Date/Time   CHOL 160 08/30/2012 0440   TRIG 77 08/30/2012 0440   HDL 62 08/30/2012 0440   CHOLHDL 2.6 08/30/2012 0440   VLDL 15 08/30/2012 0440   LDLCALC 83 08/30/2012 0440    CBC     Component Value Date/Time   WBC 8.8 08/15/2013 1202   RBC 4.22 08/15/2013 1202  RBC 3.36* 01/26/2012 1358   HGB 11.9* 08/15/2013 1202   HCT 36.5 08/15/2013 1202   PLT 257.0 08/15/2013 1202   MCV 86.4 08/15/2013 1202   MCH 28.1 08/13/2013 1600   MCHC 32.5 08/15/2013 1202   RDW 14.3 08/15/2013 1202   LYMPHSABS 2.2 08/15/2013 1202   MONOABS 0.9 08/15/2013 1202   EOSABS 0.1 08/15/2013 1202   BASOSABS 0.0 08/15/2013 1202    Hgb A1C No results found for this basename: HGBA1C         Assessment & Plan:   Hematoma of left forearm secondary to blood draw:  Reassurance given-their is no s/s of tissue ischemia or DVT Monitor for expanded bruising or increase in the size of the hematoma If does get bigger, hold aspirin and call the office  RTC as needed

## 2013-08-25 ENCOUNTER — Other Ambulatory Visit: Payer: Self-pay | Admitting: Family Medicine

## 2013-08-27 ENCOUNTER — Ambulatory Visit (AMBULATORY_SURGERY_CENTER): Payer: Medicare Other | Admitting: Gastroenterology

## 2013-08-27 ENCOUNTER — Encounter: Payer: Self-pay | Admitting: Gastroenterology

## 2013-08-27 VITALS — BP 138/61 | HR 66 | Temp 97.5°F | Resp 19 | Ht 59.0 in | Wt 133.0 lb

## 2013-08-27 DIAGNOSIS — K921 Melena: Secondary | ICD-10-CM

## 2013-08-27 DIAGNOSIS — K2270 Barrett's esophagus without dysplasia: Secondary | ICD-10-CM

## 2013-08-27 MED ORDER — SODIUM CHLORIDE 0.9 % IV SOLN: 500.0000 mL | INTRAVENOUS | Status: DC

## 2013-08-27 NOTE — Progress Notes (Signed)
Procedure ends, to recovery, report given and VSS. 

## 2013-08-27 NOTE — Op Note (Signed)
[facility] [address] [place], [address]   ENDOSCOPY PROCEDURE REPORT  PATIENT: Cindy, [name]  MR#: [id number] BIRTHDATE: [birth year] , 89  yrs. old GENDER: Female ENDOSCOPIST: Louis Meckel, MD REFERRED BY:  Crawford Givens, M.D. PROCEDURE DATE:  08/27/2013 PROCEDURE:  EGD w/ biopsy ASA CLASS:     Class II INDICATIONS:  Melena.  patient had 2 day history of melenic stools approximately 2 weeks ago. Surveillance of Barrett's esophagus. MEDICATIONS: MAC sedation, administered by CRNA and Propofol (Diprivan) 70 mg IV TOPICAL ANESTHETIC:  DESCRIPTION OF PROCEDURE: After the risks benefits and alternatives of the procedure were thoroughly explained, informed consent was obtained.  The LB [id number] endoscope was introduced through the mouth and advanced to the third portion of the duodenum. Without limitations.  The instrument was slowly withdrawn as the mucosa was fully examined.      Barrett's epithelium was seen extending from approximately 24 cm from the incisors to the stomach.  Z line was irregular.  Biopsies throughout all 4 quadrants were taken. A large sliding hiatal hernia was present.  There was no fresh or old blood in the stomach.   Barrett's epithelium was seen extending from approximately 24 cm from the incisors to the stomach.  Z line was irregular.  Biopsies throughout all 4 quadrants were taken. A large sliding hiatal hernia was present.  There was no fresh or old blood in the stomach.   The remainder of the upper endoscopy exam was otherwise normal.  Retroflexed views revealed no abnormalities.     The scope was then withdrawn from the patient and the procedure completed.  COMPLICATIONS: There were no complications. ENDOSCOPIC IMPRESSION: 1.   Barrett's esophagus 2.  large hiatal hernia  No source for GI bleeding was identified.  RECOMMENDATIONS: 1.  continue omeprazole 2.  await biopsy findings REPEAT  EXAM:  eSigned:  Louis Meckel, MD 08/27/2013 3:54 PM   CC:  PATIENT NAME:  Cindy, [name] MR#: [id number]

## 2013-08-27 NOTE — Progress Notes (Addendum)
Patient did not have preoperative order for IV antibiotic SSI prophylaxis. [phone])  Patient did not experience any of the following events: a burn prior to discharge; a fall within the facility; wrong site/side/patient/procedure/implant event; or a hospital transfer or hospital admission upon discharge from the facility. [phone])  Patient states that she's missing her call button for home.  She states that she had it here.  No one remembers that she had it here.   I spoke to the Adm nurse and she does not remember it.   She stated that maybe her son has it.  Son had her call button for home.

## 2013-08-27 NOTE — Progress Notes (Signed)
Called to room to assist during endoscopic procedure.  Patient ID and intended procedure confirmed with present staff. Received instructions for my participation in the procedure from the performing physician.  

## 2013-08-27 NOTE — Patient Instructions (Addendum)

## 2013-08-28 ENCOUNTER — Telehealth: Payer: Self-pay | Admitting: *Deleted

## 2013-08-28 NOTE — Telephone Encounter (Signed)
  Follow up Call-  Call back number 08/27/2013 02/29/2012  Post procedure Call Back phone  # [phone]  Permission to leave phone message Yes Yes     Patient questions:  Do you have a fever, pain , or abdominal swelling? no Pain Score  0 *  Have you tolerated food without any problems? yes  Have you been able to return to your normal activities? yes  Do you have any questions about your discharge instructions: Diet   no Medications  no Follow up visit  no  Do you have questions or concerns about your Care? no  Actions: * If pain score is 4 or above: No action needed, pain <4.

## 2013-08-31 ENCOUNTER — Encounter (INDEPENDENT_AMBULATORY_CARE_PROVIDER_SITE_OTHER): Payer: Medicare Other | Admitting: Ophthalmology

## 2013-08-31 DIAGNOSIS — H35039 Hypertensive retinopathy, unspecified eye: Secondary | ICD-10-CM

## 2013-08-31 DIAGNOSIS — H3530 Unspecified macular degeneration: Secondary | ICD-10-CM

## 2013-08-31 DIAGNOSIS — H43819 Vitreous degeneration, unspecified eye: Secondary | ICD-10-CM

## 2013-08-31 DIAGNOSIS — I10 Essential (primary) hypertension: Secondary | ICD-10-CM

## 2013-08-31 DIAGNOSIS — H353290 Exudative age-related macular degeneration, unspecified eye, stage unspecified: Secondary | ICD-10-CM

## 2013-09-03 ENCOUNTER — Encounter: Payer: Self-pay | Admitting: Gastroenterology

## 2013-09-10 ENCOUNTER — Telehealth: Payer: Self-pay | Admitting: Gastroenterology

## 2013-09-10 DIAGNOSIS — K921 Melena: Secondary | ICD-10-CM

## 2013-09-10 MED ORDER — OMEPRAZOLE 40 MG PO CPDR: 40.0000 mg | DELAYED_RELEASE_CAPSULE | Freq: Every day | ORAL | Status: DC

## 2013-09-10 NOTE — Telephone Encounter (Signed)
Medication [facility] Contacted pt to inform

## 2013-10-03 ENCOUNTER — Encounter: Payer: Self-pay | Admitting: Family Medicine

## 2013-10-03 ENCOUNTER — Ambulatory Visit (INDEPENDENT_AMBULATORY_CARE_PROVIDER_SITE_OTHER): Payer: Medicare Other | Admitting: Family Medicine

## 2013-10-03 VITALS — BP 180/70 | HR 74 | Temp 97.8°F | Wt 127.0 lb

## 2013-10-03 DIAGNOSIS — R51 Headache: Secondary | ICD-10-CM

## 2013-10-03 NOTE — Patient Instructions (Signed)
I would try to drink plenty of fluids, keep using the heating pad and try to get some rest.  I think this will gradually improve.

## 2013-10-04 DIAGNOSIS — R51 Headache: Secondary | ICD-10-CM | POA: Insufficient documentation

## 2013-10-04 DIAGNOSIS — R519 Headache, unspecified: Secondary | ICD-10-CM | POA: Insufficient documentation

## 2013-10-04 NOTE — Progress Notes (Signed)
Recheck BP 160/70.   BP had been controlled on home checks, lower than today at the OV.  Frontal and occiput HA, over the last week.  Episodic.  Both occur at the same time usually.  "Pressure" in the forehead.  No fevers.  Minimal cough at baseline.  No ST, no rhinorrhea.  Still active.  No ear pain.  Heat helps the occiput pain.  Not dizzy/no vertigo sx but occ lightheaded over the last week, occurs with the HA.  No decongestant use.    Meds, vitals, and allergies reviewed.   ROS: See HPI.  Otherwise, noncontributory.  Nad ncat Tm wnl B Nasal and OP exam wnl Sinuses not ttp Neck supple but L upper C spine muscles ttp, not ttp in the midline No LA in the neck  IRR, not tachy Murmur noted ctab Ext w/o edema No rash.

## 2013-10-04 NOTE — Assessment & Plan Note (Signed)
Nonfocal exam except for muscular pain near L occiput.  Nontoxic.  Would observe for now.  BP okay on home checks.  Reasonable to drink plenty of fluids, keep using the heating pad and rest.  She'll notify me if sx continue.  She agrees.  Son agrees.  F/u prn.

## 2013-10-09 ENCOUNTER — Telehealth: Payer: Self-pay | Admitting: *Deleted

## 2013-10-09 NOTE — Telephone Encounter (Signed)
Noted, thanks!

## 2013-10-09 NOTE — Telephone Encounter (Signed)
Patient called in saying that she was instructed to let you know how she was doing.  She says that it has occurred to her that this is a flare-up of her rotator cuff.  She hadn't thought about that previously and she has started using a heating pad and the routine measures for the rotator cuff injury and is feeling much better, ie no HA, neck or shoulder pain.  She says her BP is much better also.  It was elevated on one occasion on Saturday but it returned to normal and has been good since that time.

## 2013-10-22 ENCOUNTER — Other Ambulatory Visit: Payer: Self-pay

## 2013-10-22 MED ORDER — HYDROCODONE-ACETAMINOPHEN 5-325 MG PO TABS: 0.5000 | ORAL_TABLET | Freq: Two times a day (BID) | ORAL | Status: DC | PRN

## 2013-10-22 NOTE — Telephone Encounter (Signed)
Pt left note requesting rx hydrocodone apap. Call when ready for pick up. Pt left [facility] form and self addressed envelope for [facility]. [facility] form and envelope is in [facility] in box for pt to have when picks up rx.

## 2013-10-22 NOTE — Telephone Encounter (Signed)
Printed.  Thanks.  

## 2013-10-23 NOTE — Telephone Encounter (Signed)
Patient advised.  Rx left at front desk for pick up. 

## 2013-11-13 ENCOUNTER — Other Ambulatory Visit: Payer: Self-pay | Admitting: *Deleted

## 2013-11-13 NOTE — Telephone Encounter (Signed)
Patient dropped off request to mail-order pharmacy for Zolpidem 10 mg.  This was not on her current meds list but is historically.  Please advise.  Form that she has filled out is in your In Box.  It will likely be easier to print the Rx and mail it.

## 2013-11-14 MED ORDER — ZOLPIDEM TARTRATE 10 MG PO TABS: 10.0000 mg | ORAL_TABLET | Freq: Every evening | ORAL | Status: DC | PRN

## 2013-11-14 NOTE — Telephone Encounter (Signed)
Printed. Please send in.  Thanks.  

## 2013-11-15 NOTE — Telephone Encounter (Signed)
Mailed in self-addressed, postage-paid envelope.

## 2013-11-16 ENCOUNTER — Encounter (INDEPENDENT_AMBULATORY_CARE_PROVIDER_SITE_OTHER): Payer: Medicare Other | Admitting: Ophthalmology

## 2013-11-16 DIAGNOSIS — H353290 Exudative age-related macular degeneration, unspecified eye, stage unspecified: Secondary | ICD-10-CM

## 2013-11-16 DIAGNOSIS — H43819 Vitreous degeneration, unspecified eye: Secondary | ICD-10-CM

## 2013-11-16 DIAGNOSIS — H3530 Unspecified macular degeneration: Secondary | ICD-10-CM

## 2013-11-16 DIAGNOSIS — H35039 Hypertensive retinopathy, unspecified eye: Secondary | ICD-10-CM

## 2013-11-16 DIAGNOSIS — I10 Essential (primary) hypertension: Secondary | ICD-10-CM

## 2013-11-24 ENCOUNTER — Other Ambulatory Visit: Payer: Self-pay | Admitting: Family Medicine

## 2014-01-08 ENCOUNTER — Other Ambulatory Visit: Payer: Self-pay | Admitting: Cardiology

## 2014-01-29 ENCOUNTER — Other Ambulatory Visit: Payer: Self-pay | Admitting: Family Medicine

## 2014-01-30 ENCOUNTER — Ambulatory Visit (INDEPENDENT_AMBULATORY_CARE_PROVIDER_SITE_OTHER): Payer: Medicare Other | Admitting: Cardiology

## 2014-01-30 ENCOUNTER — Encounter: Payer: Self-pay | Admitting: Cardiology

## 2014-01-30 VITALS — BP 138/80 | HR 99 | Ht 60.0 in | Wt 131.0 lb

## 2014-01-30 DIAGNOSIS — I10 Essential (primary) hypertension: Secondary | ICD-10-CM

## 2014-01-30 DIAGNOSIS — I421 Obstructive hypertrophic cardiomyopathy: Secondary | ICD-10-CM

## 2014-01-30 DIAGNOSIS — I4891 Unspecified atrial fibrillation: Secondary | ICD-10-CM

## 2014-01-30 DIAGNOSIS — I2510 Atherosclerotic heart disease of native coronary artery without angina pectoris: Secondary | ICD-10-CM

## 2014-01-30 MED ORDER — DILTIAZEM HCL ER COATED BEADS 180 MG PO CP24: 180.0000 mg | ORAL_CAPSULE | Freq: Every day | ORAL | Status: DC

## 2014-01-30 MED ORDER — LOSARTAN POTASSIUM 50 MG PO TABS
50.0000 mg | ORAL_TABLET | Freq: Every day | ORAL | Status: DC
Start: 2014-01-30 — End: 2014-02-08

## 2014-01-30 NOTE — Progress Notes (Signed)
Cindy [name] Date of Birth: 27-May-1922 Medical Record [id number]  History of Present Illness: Cindy [name] is seen for followup of atrial fibrillation. She has a history of non-ST elevation myocardial infarction the setting of rapid atrial fibrillation. She was noted to have disease in 2 diagonal branches that was treated medically. She has a history of intolerance to amiodarone and is not a candidate for Tikosyn due to prolonged QT. On followup today she reports she is having more frequent arrhythmia since December. Her episodes last about an hour and occur 2-3 times per week. She just sits and rests and it gets better. She does get some throat tightness but no chest pain or dizziness. She did have an episode of melenotic stools in September with a 2 gr. Hbg drop. Dr. Deatra Ina did upper endoscopy which showed a large hiatal hernia and Barrett's esophagus. No further bleeding since then.  Current Outpatient Prescriptions on File Prior to Visit  Medication Sig Dispense Refill  . aspirin 81 MG chewable tablet Chew 1 tablet (81 mg total) by mouth daily.      Marland Kitchen BESIVANCE 0.6 % SUSP Place 1 drop into both eyes 2 (two) times daily.       . fexofenadine (ALLEGRA) 180 MG tablet Take 180 mg by mouth daily.       . fish oil-omega-3 fatty acids 1000 MG capsule Take 1 g by mouth 2 (two) times daily.        . hydrochlorothiazide (HYDRODIURIL) 25 MG tablet Take 12.5 mg by mouth daily. Take 1/2 tablet      . HYDROcodone-acetaminophen (NORCO/VICODIN) 5-325 MG per tablet Take 0.5-1 tablets by mouth 2 (two) times daily as needed.  90 tablet  0  . levothyroxine (SYNTHROID, LEVOTHROID) 25 MCG tablet Take 25 mcg by mouth every morning.      Marland Kitchen levothyroxine (SYNTHROID, LEVOTHROID) 25 MCG tablet TAKE 1 TABLET EVERY MORNING  90 tablet  0  . Multiple Vitamins-Minerals (OCUVITE PRESERVISION) TABS Take 1 tablet by mouth daily.        . nitroGLYCERIN (NITROSTAT) 0.4 MG SL tablet Place 1 tablet (0.4 mg total) under the  tongue every 5 (five) minutes as needed for chest pain (up to 3 doses).  25 tablet  3  . omeprazole (PRILOSEC) 40 MG capsule Take 1 capsule (40 mg total) by mouth daily.  90 capsule  3  . zolpidem (AMBIEN) 10 MG tablet Take 1 tablet (10 mg total) by mouth at bedtime as needed for sleep.  30 tablet  2   No current facility-administered medications on file prior to visit.    Allergies  Allergen Reactions  . Irbesartan     REACTION: swelling  . Lipitor [Atorvastatin]     LFT elevation, nausea  . Norpace [Disopyramide]     Dry mouth  . Ramipril     REACTION: lips swelling  . Telmisartan-Hctz     REACTION: incontinence  . Morphine And Related Other (See Comments)    "drives me crazy"    Past Medical History  Diagnosis Date  . Atrial fibrillation 04/23-24/2007    a. recurrent PAF with RVR in September 2013. b. Evaluated 03/2013, previously intolerant to Norpace and Amiodarone - consider Multaq if recurs. c. Not on anticoag due to history of falls and also some internal bleeding per son.  . Hypertrophic cardiomyopathy   . Urinary incontinence   . Diverticulosis of colon (without mention of hemorrhage) 2003/ 08/2000    EGD/colonoscopy Barretts esophagus//H.H divertics 08/2000  .  Cervical mass     C2 lateral mass fracture  . Hypertension   . Hypercholesterolemia     219/497  . Hypothyroidism   . Multinodular goiter (nontoxic)   . Osteoporosis   . Blood transfusion   . Jaundice ~ 1935    "in grade school"  . Degenerative joint disease     back  . Depression     "husband died 02/11/2011"  . Anemia, iron deficiency   . Personal history of colonic polyps 02/29/2012    tubular adenoma  . Barrett's esophagus   . CAD (coronary artery disease)     a. NSTEMI 03/2013: secondary to diagonal disease (small, not amenable to PCI, for med rx).  . Moderate mitral regurgitation 2014  . Mild aortic stenosis 2014    Past Surgical History  Procedure Laterality Date  . Bladder surgery       bladder tack early 90's  . Tear duct probing  07/29/03    tear duct surg  . Cystourethroscopy  09/17/03  . Rotator cuff repair  ? date; 09/07/05    left; right( Dr. Gladstone Lighter)  . Appendectomy  1941  . Breast surgery  1981    breast reduction  . Eye surgery  03/2002    cataract OS  . Thyroid ultrasound  10/14/2003    MNG, no dominant masses  . Doppler echocardiography  03/05/2002&09/11/2003    ECHO, EF wnl, mild stenosis, A.S. mild MR, Mild T.R03/31/2003//ECHO EF 70%,LVH, ?diast dysfunction 09/11/2003  . Cataract extraction w/ intraocular lens  implant, bilateral  2003  . Dilation and curettage of uterus  09/07/2000    endometrial polyps removed, path all benign   . Tonsillectomy and adenoidectomy      "as a child"  . Fracture surgery  2010    right knee    History  Smoking status  . Former Smoker -- 0.50 packs/day for 4 years  . Types: Cigarettes  . Quit date: 07/06/1974  Smokeless tobacco  . Never Used    History  Alcohol Use No    Family History  Problem Relation Age of Onset  . Heart failure Mother     CHF, DM, HBP  . Hypertension Mother   . Uterine cancer Mother   . Stroke Mother   . Colon cancer Neg Hx   . Esophageal cancer Neg Hx   . Rectal cancer Neg Hx   . Stomach cancer Neg Hx     Review of Systems: The review of systems is positive for chronic fatigue.  All other systems were reviewed and are negative.  Physical Exam: BP 138/80  Pulse 99  Ht 5' (1.524 m)  Wt 131 lb (59.421 kg)  BMI 25.58 kg/m2 She is an elderly, anxious white female in no acute distress. HEENT: Normal. Neck is without jugular venous distention or bruits. There is no adenopathy or thyromegaly. Lungs are clear. Cardiovascular: Irregular rate and rhythm. Grade 2/6 harsh systolic murmur the right upper sternal border. There is no S3. Abdomen: Soft and nontender. No masses or bruits. Extremities: No cyanosis or edema. Pedal pulses are 2+ and symmetric. Neuro: Alert and oriented x3.  Cranial nerves II through XII are intact. Mood is anxious. LABORATORY DATA: Lab Results  Component Value Date   WBC 8.8 08/15/2013   HGB 11.9* 08/15/2013   HCT 36.5 08/15/2013   PLT 257.0 08/15/2013   GLUCOSE 99 08/13/2013   CHOL 160 08/30/2012   TRIG 77 08/30/2012   HDL 62 08/30/2012   LDLDIRECT  132.8 08/16/2007   LDLCALC 83 08/30/2012   ALT 10 08/13/2013   AST 16 08/13/2013   NA 134* 08/13/2013   K 3.8 08/13/2013   CL 101 08/13/2013   CREATININE 0.67 08/13/2013   BUN 19 08/13/2013   CO2 24 08/13/2013   TSH 1.29 02/23/2013   INR 0.92 08/13/2013   MICROALBUR 0.4 02/11/2010   Ecg: 08/13/14- Atrial fibrillation. Old septal infarct. LVH with repolarization abnormality.  Assessment / Plan: 1. Paroxysmal atrial fibrillation. Based on her drug intolerances I think that she is on appropriate medication at this time. She is on rate control with diltiazem. Rate control appears to be OK when she is in afib including today. She has failed 2 antiarrhythmic drugs due to intolerance. She is not a candidate for anticoagulation due to history of multiple falls and GI bleeds.  2. Hypertension. Blood pressure is controlled at today. Continue current medications.  3. Hypertrophic cardiomyopathy. Last echo showed more features of moderate LVH. She does have moderate mitral insufficiency.  4. Coronary disease with recent NSTEMI related to high-grade diagonal disease and atrial fibrillation. We'll treat medically.  5. Hyperlipidemia. Intolerant to statin therapy. Recommend dietary modification.

## 2014-01-30 NOTE — Patient Instructions (Signed)
Continue your current therapy  I will see you in 6 months.   

## 2014-02-03 ENCOUNTER — Other Ambulatory Visit: Payer: Self-pay | Admitting: Family Medicine

## 2014-02-04 ENCOUNTER — Encounter (HOSPITAL_COMMUNITY): Payer: Self-pay | Admitting: Emergency Medicine

## 2014-02-04 ENCOUNTER — Emergency Department (HOSPITAL_COMMUNITY): Payer: Medicare Other

## 2014-02-04 ENCOUNTER — Inpatient Hospital Stay (HOSPITAL_COMMUNITY)
Admission: EM | Admit: 2014-02-04 | Discharge: 2014-02-08 | DRG: 291 | Disposition: A | Discharge status: Home / Self Care | Payer: Medicare Other | Attending: Interventional Cardiology | Admitting: Interventional Cardiology

## 2014-02-04 DIAGNOSIS — E039 Hypothyroidism, unspecified: Secondary | ICD-10-CM | POA: Diagnosis present

## 2014-02-04 DIAGNOSIS — I10 Essential (primary) hypertension: Secondary | ICD-10-CM | POA: Diagnosis present

## 2014-02-04 DIAGNOSIS — F329 Major depressive disorder, single episode, unspecified: Secondary | ICD-10-CM | POA: Diagnosis present

## 2014-02-04 DIAGNOSIS — F3289 Other specified depressive episodes: Secondary | ICD-10-CM | POA: Diagnosis present

## 2014-02-04 DIAGNOSIS — I058 Other rheumatic mitral valve diseases: Secondary | ICD-10-CM | POA: Diagnosis present

## 2014-02-04 DIAGNOSIS — I480 Paroxysmal atrial fibrillation: Secondary | ICD-10-CM | POA: Diagnosis present

## 2014-02-04 DIAGNOSIS — R0989 Other specified symptoms and signs involving the circulatory and respiratory systems: Secondary | ICD-10-CM

## 2014-02-04 DIAGNOSIS — R0603 Acute respiratory distress: Secondary | ICD-10-CM

## 2014-02-04 DIAGNOSIS — I5033 Acute on chronic diastolic (congestive) heart failure: Principal | ICD-10-CM | POA: Diagnosis present

## 2014-02-04 DIAGNOSIS — I5031 Acute diastolic (congestive) heart failure: Secondary | ICD-10-CM

## 2014-02-04 DIAGNOSIS — I160 Hypertensive urgency: Secondary | ICD-10-CM | POA: Diagnosis present

## 2014-02-04 DIAGNOSIS — I059 Rheumatic mitral valve disease, unspecified: Secondary | ICD-10-CM | POA: Diagnosis present

## 2014-02-04 DIAGNOSIS — I350 Nonrheumatic aortic (valve) stenosis: Secondary | ICD-10-CM

## 2014-02-04 DIAGNOSIS — I340 Nonrheumatic mitral (valve) insufficiency: Secondary | ICD-10-CM | POA: Diagnosis present

## 2014-02-04 DIAGNOSIS — I421 Obstructive hypertrophic cardiomyopathy: Secondary | ICD-10-CM | POA: Diagnosis present

## 2014-02-04 DIAGNOSIS — R911 Solitary pulmonary nodule: Secondary | ICD-10-CM | POA: Diagnosis present

## 2014-02-04 DIAGNOSIS — M810 Age-related osteoporosis without current pathological fracture: Secondary | ICD-10-CM | POA: Diagnosis present

## 2014-02-04 DIAGNOSIS — J9600 Acute respiratory failure, unspecified whether with hypoxia or hypercapnia: Secondary | ICD-10-CM | POA: Diagnosis present

## 2014-02-04 DIAGNOSIS — R32 Unspecified urinary incontinence: Secondary | ICD-10-CM | POA: Diagnosis present

## 2014-02-04 DIAGNOSIS — I4891 Unspecified atrial fibrillation: Secondary | ICD-10-CM

## 2014-02-04 DIAGNOSIS — I2510 Atherosclerotic heart disease of native coronary artery without angina pectoris: Secondary | ICD-10-CM | POA: Diagnosis present

## 2014-02-04 DIAGNOSIS — I252 Old myocardial infarction: Secondary | ICD-10-CM

## 2014-02-04 DIAGNOSIS — I359 Nonrheumatic aortic valve disorder, unspecified: Secondary | ICD-10-CM | POA: Diagnosis present

## 2014-02-04 DIAGNOSIS — R0609 Other forms of dyspnea: Secondary | ICD-10-CM

## 2014-02-04 DIAGNOSIS — Z87891 Personal history of nicotine dependence: Secondary | ICD-10-CM

## 2014-02-04 DIAGNOSIS — I2789 Other specified pulmonary heart diseases: Secondary | ICD-10-CM | POA: Diagnosis present

## 2014-02-04 DIAGNOSIS — I509 Heart failure, unspecified: Secondary | ICD-10-CM | POA: Diagnosis present

## 2014-02-04 DIAGNOSIS — I5032 Chronic diastolic (congestive) heart failure: Secondary | ICD-10-CM | POA: Diagnosis present

## 2014-02-04 DIAGNOSIS — J810 Acute pulmonary edema: Secondary | ICD-10-CM

## 2014-02-04 LAB — MAGNESIUM: Magnesium: 2.1 mg/dL (ref 1.5–2.5)

## 2014-02-04 LAB — I-STAT ARTERIAL BLOOD GAS, ED
ACID-BASE DEFICIT: 1 mmol/L (ref 0.0–2.0)
Acid-base deficit: 5 mmol/L — ABNORMAL HIGH (ref 0.0–2.0)
BICARBONATE: 21.3 meq/L (ref 20.0–24.0)
Bicarbonate: 23.4 mEq/L (ref 20.0–24.0)
O2 SAT: 99 %
O2 SAT: 99 %
TCO2: 23 mmol/L (ref 0–100)
TCO2: 25 mmol/L (ref 0–100)
pCO2 arterial: 42.9 mmHg (ref 35.0–45.0)
pCO2 arterial: 36.3 mmHg (ref 35.0–45.0)
pH, Arterial: 7.305 — ABNORMAL LOW (ref 7.350–7.450)
pH, Arterial: 7.418 (ref 7.350–7.450)
pO2, Arterial: 147 mmHg — ABNORMAL HIGH (ref 80.0–100.0)
pO2, Arterial: 148 mmHg — ABNORMAL HIGH (ref 80.0–100.0)

## 2014-02-04 LAB — CBC
HCT: 35.3 % — ABNORMAL LOW (ref 36.0–46.0)
HEMOGLOBIN: 11 g/dL — AB (ref 12.0–15.0)
MCH: 24.9 pg — AB (ref 26.0–34.0)
MCHC: 31.2 g/dL (ref 30.0–36.0)
MCV: 79.9 fL (ref 78.0–100.0)
Platelets: 217 10*3/uL (ref 150–400)
RBC: 4.42 MIL/uL (ref 3.87–5.11)
RDW: 15.6 % — ABNORMAL HIGH (ref 11.5–15.5)
WBC: 9.5 10*3/uL (ref 4.0–10.5)

## 2014-02-04 LAB — CREATININE, SERUM
CREATININE: 0.78 mg/dL (ref 0.50–1.10)
GFR calc Af Amer: 83 mL/min — ABNORMAL LOW (ref >90)
GFR, EST NON AFRICAN AMERICAN: 72 mL/min — AB (ref >90)

## 2014-02-04 LAB — I-STAT CHEM 8, ED
BUN: 15 mg/dL (ref 6–23)
Calcium, Ion: 1.15 mmol/L (ref 1.13–1.30)
Chloride: 99 mEq/L (ref 96–112)
Creatinine, Ser: 0.9 mg/dL (ref 0.50–1.10)
Glucose, Bld: 255 mg/dL — ABNORMAL HIGH (ref 70–99)
HCT: 41 % (ref 36.0–46.0)
HEMOGLOBIN: 13.9 g/dL (ref 12.0–15.0)
Potassium: 3.8 mEq/L (ref 3.7–5.3)
SODIUM: 137 meq/L (ref 137–147)
TCO2: 21 mmol/L (ref 0–100)

## 2014-02-04 LAB — CBC WITH DIFFERENTIAL/PLATELET
BASOS ABS: 0 10*3/uL (ref 0.0–0.1)
Basophils Relative: 0 % (ref 0–1)
EOS PCT: 0 % (ref 0–5)
Eosinophils Absolute: 0 10*3/uL (ref 0.0–0.7)
HCT: 36.5 % (ref 36.0–46.0)
Hemoglobin: 11.2 g/dL — ABNORMAL LOW (ref 12.0–15.0)
LYMPHS ABS: 0.6 10*3/uL — AB (ref 0.7–4.0)
LYMPHS PCT: 4 % — AB (ref 12–46)
MCH: 25 pg — ABNORMAL LOW (ref 26.0–34.0)
MCHC: 30.7 g/dL (ref 30.0–36.0)
MCV: 81.5 fL (ref 78.0–100.0)
Monocytes Absolute: 0.7 10*3/uL (ref 0.1–1.0)
Monocytes Relative: 4 % (ref 3–12)
NEUTROS PCT: 92 % — AB (ref 43–77)
Neutro Abs: 14.6 10*3/uL — ABNORMAL HIGH (ref 1.7–7.7)
PLATELETS: 240 10*3/uL (ref 150–400)
RBC: 4.48 MIL/uL (ref 3.87–5.11)
RDW: 15.4 % (ref 11.5–15.5)
WBC: 15.9 10*3/uL — AB (ref 4.0–10.5)

## 2014-02-04 LAB — URINALYSIS, ROUTINE W REFLEX MICROSCOPIC
Bilirubin Urine: NEGATIVE
GLUCOSE, UA: NEGATIVE mg/dL
HGB URINE DIPSTICK: NEGATIVE
KETONES UR: NEGATIVE mg/dL
Leukocytes, UA: NEGATIVE
Nitrite: NEGATIVE
PROTEIN: NEGATIVE mg/dL
Specific Gravity, Urine: 1.017 (ref 1.005–1.030)
Urobilinogen, UA: 0.2 mg/dL (ref 0.0–1.0)
pH: 5.5 (ref 5.0–8.0)

## 2014-02-04 LAB — I-STAT TROPONIN, ED
TROPONIN I, POC: 0.07 ng/mL (ref 0.00–0.08)
Troponin i, poc: 0.53 ng/mL (ref 0.00–0.08)
Troponin i, poc: 0.78 ng/mL (ref 0.00–0.08)

## 2014-02-04 LAB — I-STAT CG4 LACTIC ACID, ED: Lactic Acid, Venous: 4.03 mmol/L — ABNORMAL HIGH (ref 0.5–2.2)

## 2014-02-04 LAB — MRSA PCR SCREENING: MRSA BY PCR: NEGATIVE

## 2014-02-04 LAB — PRO B NATRIURETIC PEPTIDE: Pro B Natriuretic peptide (BNP): 782.8 pg/mL — ABNORMAL HIGH (ref 0–450)

## 2014-02-04 MED ORDER — DILTIAZEM HCL ER COATED BEADS 180 MG PO CP24
180.0000 mg | ORAL_CAPSULE | Freq: Every day | ORAL | Status: DC
  Administered 2014-02-05 – 2014-02-08 (×4): 180 mg via ORAL
  Filled 2014-02-04 (×4): qty 1

## 2014-02-04 MED ORDER — LEVOTHYROXINE SODIUM 25 MCG PO TABS
25.0000 ug | ORAL_TABLET | Freq: Every morning | ORAL | Status: DC
  Administered 2014-02-05 – 2014-02-08 (×4): 25 ug via ORAL
  Filled 2014-02-04 (×4): qty 1

## 2014-02-04 MED ORDER — SODIUM CHLORIDE 0.9 % IJ SOLN
3.0000 mL | INTRAMUSCULAR | Status: DC | PRN
  Administered 2014-02-07: 3 mL via INTRAVENOUS

## 2014-02-04 MED ORDER — SODIUM CHLORIDE 0.9 % IJ SOLN
3.0000 mL | Freq: Two times a day (BID) | INTRAMUSCULAR | Status: DC
Start: 2014-02-04 — End: 2014-02-08
  Administered 2014-02-04 – 2014-02-07 (×7): 3 mL via INTRAVENOUS

## 2014-02-04 MED ORDER — SODIUM CHLORIDE 0.9 % IV SOLN: 250.0000 mL | INTRAVENOUS | Status: DC | PRN

## 2014-02-04 MED ORDER — OMEGA-3-ACID ETHYL ESTERS 1 G PO CAPS
1.0000 g | ORAL_CAPSULE | Freq: Two times a day (BID) | ORAL | Status: DC
  Administered 2014-02-04 – 2014-02-08 (×8): 1 g via ORAL
  Filled 2014-02-04 (×9): qty 1

## 2014-02-04 MED ORDER — ZOLPIDEM TARTRATE 5 MG PO TABS
5.0000 mg | ORAL_TABLET | Freq: Every evening | ORAL | Status: DC | PRN
  Administered 2014-02-05 – 2014-02-07 (×3): 5 mg via ORAL
  Filled 2014-02-04 (×3): qty 1

## 2014-02-04 MED ORDER — NITROGLYCERIN 0.4 MG SL SUBL: 0.4000 mg | SUBLINGUAL_TABLET | SUBLINGUAL | Status: DC | PRN

## 2014-02-04 MED ORDER — PANTOPRAZOLE SODIUM 40 MG PO TBEC
80.0000 mg | DELAYED_RELEASE_TABLET | Freq: Every day | ORAL | Status: DC
  Administered 2014-02-04 – 2014-02-08 (×5): 80 mg via ORAL
  Filled 2014-02-04 (×5): qty 2

## 2014-02-04 MED ORDER — FUROSEMIDE 10 MG/ML IJ SOLN
40.0000 mg | Freq: Once | INTRAMUSCULAR | Status: AC
  Administered 2014-02-04: 40 mg via INTRAVENOUS
  Filled 2014-02-04: qty 4

## 2014-02-04 MED ORDER — OMEPRAZOLE MAGNESIUM 20 MG PO TBEC: 40.0000 mg | DELAYED_RELEASE_TABLET | Freq: Every day | ORAL | Status: DC

## 2014-02-04 MED ORDER — NITROGLYCERIN IN D5W 200-5 MCG/ML-% IV SOLN
2.0000 ug/min | Freq: Once | INTRAVENOUS | Status: AC
  Administered 2014-02-04: 5 ug/min via INTRAVENOUS
  Filled 2014-02-04: qty 250

## 2014-02-04 MED ORDER — ASPIRIN 81 MG PO CHEW
81.0000 mg | CHEWABLE_TABLET | Freq: Every day | ORAL | Status: DC
  Administered 2014-02-05 – 2014-02-08 (×4): 81 mg via ORAL
  Filled 2014-02-04 (×4): qty 1

## 2014-02-04 MED ORDER — LOSARTAN POTASSIUM 50 MG PO TABS
50.0000 mg | ORAL_TABLET | Freq: Every day | ORAL | Status: DC
  Administered 2014-02-05: 50 mg via ORAL
  Filled 2014-02-04: qty 1

## 2014-02-04 MED ORDER — NITROGLYCERIN 0.4 MG SL SUBL
0.4000 mg | SUBLINGUAL_TABLET | SUBLINGUAL | Status: AC
  Administered 2014-02-04 (×3): 0.4 mg via SUBLINGUAL
  Filled 2014-02-04: qty 1

## 2014-02-04 MED ORDER — ASPIRIN 81 MG PO CHEW
324.0000 mg | CHEWABLE_TABLET | Freq: Once | ORAL | Status: AC
  Administered 2014-02-04: 324 mg via ORAL
  Filled 2014-02-04: qty 4

## 2014-02-04 MED ORDER — OMEGA-3 FATTY ACIDS 1000 MG PO CAPS: 1.0000 g | ORAL_CAPSULE | Freq: Two times a day (BID) | ORAL | Status: DC

## 2014-02-04 MED ORDER — HEPARIN SODIUM (PORCINE) 5000 UNIT/ML IJ SOLN
5000.0000 [IU] | Freq: Three times a day (TID) | INTRAMUSCULAR | Status: DC
  Administered 2014-02-04 – 2014-02-08 (×11): 5000 [IU] via SUBCUTANEOUS
  Filled 2014-02-04 (×14): qty 1

## 2014-02-04 MED ORDER — HYDROCHLOROTHIAZIDE 25 MG PO TABS
12.5000 mg | ORAL_TABLET | Freq: Every day | ORAL | Status: DC
  Filled 2014-02-04: qty 0.5

## 2014-02-04 MED ORDER — GATIFLOXACIN 0.5 % OP SOLN
1.0000 [drp] | Freq: Four times a day (QID) | OPHTHALMIC | Status: DC
  Administered 2014-02-04 – 2014-02-08 (×15): 1 [drp] via OPHTHALMIC
  Filled 2014-02-04: qty 2.5

## 2014-02-04 MED ORDER — POTASSIUM CHLORIDE CRYS ER 20 MEQ PO TBCR
40.0000 meq | EXTENDED_RELEASE_TABLET | Freq: Once | ORAL | Status: AC
  Administered 2014-02-04: 40 meq via ORAL
  Filled 2014-02-04: qty 2

## 2014-02-04 NOTE — ED Notes (Signed)
Pt taken off bipap and placed on [place] by RT; tolerating well at present

## 2014-02-04 NOTE — ED Notes (Signed)
Cardiology PA at bedside seeing pt

## 2014-02-04 NOTE — Progress Notes (Signed)
Pt arrived from the ED via stretcher on the cardiac monitor, accompanied by RN. Pt awake, alert and oriented and in no apparent discomfort or distress. Pt moved over to hospital bed by staff. Pt placed on the monitor, telemetry monitoring unit notified. Vitals obtained, VSS. Pt has a foley catheter, lungs clear to auscultation, scant amount of redness noticed under left breast. Pt is HOH in both ears but hears better in the right ear. Pt did get a heart healthy supper tray. Pt's son at the bedside. Orders reviewed and released, patient nares swabbed per protocol and given CHG bath. Report given to oncoming night shift nurse.  Roselyn Reef Shaquan Missey,RN

## 2014-02-04 NOTE — H&P (Signed)
Chief Complaint: SOB  Primary Cardiologist: Dr. Martinique  HPI: The patient is a 80 y/o female, followed by Dr. Martinique. She has a history of non-ST elevation myocardial infarction, 03/2013, the setting of rapid atrial fibrillation. LHC at that time demonstrated a high-grade stenosis in the first diagonal branch (moderate vessel in length but small in caliber) also with a 70% stenosis in the second diagonal. It was decided to treat her CAD medically. Her last 2D echo was 03/27/2013, demonstrating normal systolic function, with an EF of 60-65%, LVH with focal basal hypertrophy with a pseudonormal left ventricular filling pattern, with concomitant abnormal relaxation and increased filling pressure (grade 2 diastolic dysfunction).  There was also mild AS and mild MR and MS. Other history is significant for HTN and HLD. She also has a history of intolerance to amiodarone and is not a candidate for Tikosyn due to prolonged QT. She is now on rate control medication with Cardizem. She is not a candidate for anticoagulation due to history of multiple falls and GI bleeds. Her last office visit with Dr. Martinique was 01/30/14 and she was felt to be stable from a cardiac standpoint at that time.   She presents to the [facility] ER today with respiratory distress. Her symptoms first started this morning and developed suddenly. She was transported via EMS. When they arrived at her home, she was in acute respitartory distress, requiring CPAP. She apparently then became unresponsive and then was bagged. She was reported to be tachypnic and had labored breathing on arrival to the ER. Her initial O2 sat was 60%. CXR demonstrates finding consistent with CHF. There is cardiomegaly with interstitial edema and small bilateral effusions. There is also a 1.1 x 1.0 cm nodular opacity in the right mid lung region. There is pulmonary venous hypertension. There is no appreciable airspace consolidation. BNP pending. POC troponin is negative, however  EKG shows inferior lateral ST depressions, which appear to be new. She denies chest pain currently, but states that she occasionally has bilateral neck tightness that commonly occurs at rest, lasting ~15 minutes at a time. Her last episode of neck tightness was yesterday. She was also noted to be hypertensive on arrival, with SBP reported to be in the 190s. She was continued on CPAP and was given IV Lasix and IV NTG. Her BP has improved, as well as her breathing. She his now on supplemental O2 via [place].   Past Medical History  Diagnosis Date  . Atrial fibrillation 04/23-24/2007    a. recurrent PAF with RVR in September 2013. b. Evaluated 03/2013, previously intolerant to Norpace and Amiodarone - consider Multaq if recurs. c. Not on anticoag due to history of falls and also some internal bleeding per son.  . Hypertrophic cardiomyopathy   . Urinary incontinence   . Diverticulosis of colon (without mention of hemorrhage) 2003/ 08/2000    EGD/colonoscopy Barretts esophagus//H.H divertics 08/2000  . Cervical mass     C2 lateral mass fracture  . Hypertension   . Hypercholesterolemia     219/497  . Hypothyroidism   . Multinodular goiter (nontoxic)   . Osteoporosis   . Blood transfusion   . Jaundice ~ 1935    "in grade school"  . Degenerative joint disease     back  . Depression     "husband died 02-24-11"  . Anemia, iron deficiency   . Personal history of colonic polyps 02/29/2012    tubular adenoma  . Barrett's esophagus   . CAD (coronary artery  disease)     a. NSTEMI 03/2013: secondary to diagonal disease (small, not amenable to PCI, for med rx).  . Moderate mitral regurgitation 2014  . Mild aortic stenosis 2014    Past Surgical History  Procedure Laterality Date  . Bladder surgery      bladder tack early 90's  . Tear duct probing  07/29/03    tear duct surg  . Cystourethroscopy  09/17/03  . Rotator cuff repair  ? date; 09/07/05    left; right( Dr. Gladstone Lighter)  . Appendectomy  1941  .  Breast surgery  1981    breast reduction  . Eye surgery  03/2002    cataract OS  . Thyroid ultrasound  10/14/2003    MNG, no dominant masses  . Doppler echocardiography  03/05/2002&09/11/2003    ECHO, EF wnl, mild stenosis, A.S. mild MR, Mild T.R03/31/2003//ECHO EF 70%,LVH, ?diast dysfunction 09/11/2003  . Cataract extraction w/ intraocular lens  implant, bilateral  2003  . Dilation and curettage of uterus  09/07/2000    endometrial polyps removed, path all benign   . Tonsillectomy and adenoidectomy      "as a child"  . Fracture surgery  2010    right knee    Family History  Problem Relation Age of Onset  . Heart failure Mother     CHF, DM, HBP  . Hypertension Mother   . Uterine cancer Mother   . Stroke Mother   . Colon cancer Neg Hx   . Esophageal cancer Neg Hx   . Rectal cancer Neg Hx   . Stomach cancer Neg Hx    Social History:  reports that she quit smoking about 39 years ago. Her smoking use included Cigarettes. She has a 2 pack-year smoking history. She has never used smokeless tobacco. She reports that she does not drink alcohol or use illicit drugs.  Allergies:  Allergies  Allergen Reactions  . Irbesartan     REACTION: swelling  . Lipitor [Atorvastatin]     LFT elevation, nausea  . Norpace [Disopyramide]     Dry mouth  . Ramipril     REACTION: lips swelling  . Telmisartan-Hctz     REACTION: incontinence  . Morphine And Related Other (See Comments)    "drives me crazy"     (Not in a hospital admission)  Results for orders placed during the hospital encounter of 02/04/14 (from the past 48 hour(s))  I-STAT ARTERIAL BLOOD GAS, ED     Status: Abnormal   Collection Time    02/04/14 11:37 AM      Result Value Ref Range   pH, Arterial 7.305 (*) 7.350 - 7.450   pCO2 arterial 42.9  35.0 - 45.0 mmHg   pO2, Arterial 147.0 (*) 80.0 - 100.0 mmHg   Bicarbonate 21.3  20.0 - 24.0 mEq/L   TCO2 23  0 - 100 mmol/L   O2 Saturation 99.0     Acid-base deficit 5.0 (*) 0.0  - 2.0 mmol/L   Collection site RADIAL, ALLEN'S TEST ACCEPTABLE     Drawn by RT     Sample type ARTERIAL    URINALYSIS, ROUTINE W REFLEX MICROSCOPIC     Status: None   Collection Time    02/04/14 11:40 AM      Result Value Ref Range   Color, Urine YELLOW  YELLOW   APPearance CLEAR  CLEAR   Specific Gravity, Urine 1.017  1.005 - 1.030   pH 5.5  5.0 - 8.0   Glucose,  UA NEGATIVE  NEGATIVE mg/dL   Hgb urine dipstick NEGATIVE  NEGATIVE   Bilirubin Urine NEGATIVE  NEGATIVE   Ketones, ur NEGATIVE  NEGATIVE mg/dL   Protein, ur NEGATIVE  NEGATIVE mg/dL   Urobilinogen, UA 0.2  0.0 - 1.0 mg/dL   Nitrite NEGATIVE  NEGATIVE   Leukocytes, UA NEGATIVE  NEGATIVE   Comment: MICROSCOPIC NOT DONE ON URINES WITH NEGATIVE PROTEIN, BLOOD, LEUKOCYTES, NITRITE, OR GLUCOSE <1000 mg/dL.  CBC WITH DIFFERENTIAL     Status: Abnormal   Collection Time    02/04/14 11:50 AM      Result Value Ref Range   WBC 15.9 (*) 4.0 - 10.5 K/uL   RBC 4.48  3.87 - 5.11 MIL/uL   Hemoglobin 11.2 (*) 12.0 - 15.0 g/dL   HCT 36.5  36.0 - 46.0 %   MCV 81.5  78.0 - 100.0 fL   MCH 25.0 (*) 26.0 - 34.0 pg   MCHC 30.7  30.0 - 36.0 g/dL   RDW 15.4  11.5 - 15.5 %   Platelets 240  150 - 400 K/uL   Neutrophils Relative % 92 (*) 43 - 77 %   Neutro Abs 14.6 (*) 1.7 - 7.7 K/uL   Lymphocytes Relative 4 (*) 12 - 46 %   Lymphs Abs 0.6 (*) 0.7 - 4.0 K/uL   Monocytes Relative 4  3 - 12 %   Monocytes Absolute 0.7  0.1 - 1.0 K/uL   Eosinophils Relative 0  0 - 5 %   Eosinophils Absolute 0.0  0.0 - 0.7 K/uL   Basophils Relative 0  0 - 1 %   Basophils Absolute 0.0  0.0 - 0.1 K/uL  I-STAT TROPOININ, ED     Status: None   Collection Time    02/04/14 12:13 PM      Result Value Ref Range   Troponin i, poc 0.07  0.00 - 0.08 ng/mL   Comment 3            Comment: Due to the release kinetics of cTnI,     a negative result within the first hours     of the onset of symptoms does not rule out     myocardial infarction with certainty.     If  myocardial infarction is still suspected,     repeat the test at appropriate intervals.  I-STAT CHEM 8, ED     Status: Abnormal   Collection Time    02/04/14 12:15 PM      Result Value Ref Range   Sodium 137  137 - 147 mEq/L   Potassium 3.8  3.7 - 5.3 mEq/L   Chloride 99  96 - 112 mEq/L   BUN 15  6 - 23 mg/dL   Creatinine, Ser 0.90  0.50 - 1.10 mg/dL   Glucose, Bld 255 (*) 70 - 99 mg/dL   Calcium, Ion 1.15  1.13 - 1.30 mmol/L   TCO2 21  0 - 100 mmol/L   Hemoglobin 13.9  12.0 - 15.0 g/dL   HCT 41.0  36.0 - 46.0 %  I-STAT CG4 LACTIC ACID, ED     Status: Abnormal   Collection Time    02/04/14 12:15 PM      Result Value Ref Range   Lactic Acid, Venous 4.03 (*) 0.5 - 2.2 mmol/L  I-STAT ARTERIAL BLOOD GAS, ED     Status: Abnormal   Collection Time    02/04/14  1:28 PM      Result Value Ref  Range   pH, Arterial 7.418  7.350 - 7.450   pCO2 arterial 36.3  35.0 - 45.0 mmHg   pO2, Arterial 148.0 (*) 80.0 - 100.0 mmHg   Bicarbonate 23.4  20.0 - 24.0 mEq/L   TCO2 25  0 - 100 mmol/L   O2 Saturation 99.0     Acid-base deficit 1.0  0.0 - 2.0 mmol/L   Collection site RADIAL, ALLEN'S TEST ACCEPTABLE     Drawn by RT     Sample type ARTERIAL     Dg Chest Portable 1 View  02/04/2014   CLINICAL DATA:  Shortness of Breath  EXAM: PORTABLE CHEST - 1 VIEW  COMPARISON:  March 26, 2013  FINDINGS: There is cardiomegaly with interstitial edema and small bilateral effusions. There is a 1.1 x 1.0 cm nodular opacity in the right mid lung region. There is pulmonary venous hypertension. There is no appreciable airspace consolidation.  There is a large hiatal type hernia.  There is atherosclerotic change in the aorta. There is postoperative change in the shoulders bilaterally.  IMPRESSION: Congestive heart failure.  Nodular opacity right mid lung. This is a new finding compared to prior study. This finding warrants noncontrast enhanced chest CT to further evaluate; a small neoplasm cannot be excluded in this area.   Sizable high type hernia.   Electronically Signed   By: Lowella Grip M.D.   On: 02/04/2014 11:33    Review of Systems  Constitutional: Positive for chills. Negative for fever.  Respiratory: Positive for cough, sputum production and shortness of breath.   Cardiovascular: Positive for chest pain and palpitations. Negative for leg swelling.  Neurological: Negative for dizziness and loss of consciousness.  All other systems reviewed and are negative.    Blood pressure 110/46, pulse 69, resp. rate 26, SpO2 95.00%. Physical Exam  Constitutional: She appears well-developed and well-nourished. No distress.  Neck: JVD present.  Cardiovascular: Normal rate, regular rhythm and intact distal pulses.   Murmur (2/6 SM best heard at LUSB) heard. Respiratory: Effort normal and breath sounds normal. No respiratory distress. She has no wheezes. She has no rales ( mild left sided basilar rales).  Musculoskeletal: She exhibits no edema.  Skin: Skin is warm and dry. She is not diaphoretic.  Psychiatric: She has a normal mood and affect. Her behavior is normal.     Assessment/Plan Principal Problem:   Acute on chronic diastolic CHF (congestive heart failure) Active Problems:   HYPERTENSION   CAD (coronary artery disease)   Respiratory distress   PAF (paroxysmal atrial fibrillation)   Plan: 80 y/o female with known CAD, s/p NSTEMI in 2014 resulting in [place] which demonstrated high-grade stenosis in the first diagonal branch (moderate vessel in length but small in caliber) also with a 70% stenosis in the second diagonal, being managed medically, PAF (rate controlled with Cardizem, not on anticoagulant due to fall risk and h/o GIB), and hypertrophic cardiomyopathy with LVH and grade 2 diastolic dysfunction, who presented to ER with respiratory distress. W/U suggested flash pulmonary edema, in the setting of hypertension w/ SBP in the 190s. CXR consistent with CHF. Condition has improved with CPAP, IV  Lasix and IV NTG. She has been transitioned off CPAP and is now on [place]. BP improved to 110/46. HR stable. EKG demonstrates new inferior lateral ST depressions, notably in V6. She denies chest pain, but reports recent history of intermittent bilateral neck tightness, occuring at rest. POC troponin is negative. Will likely plan to admit for continued observation. Will  need to continue to cycle enzymes. Continue to monitor BP. Will continue IV Lasix. Renal function is normal w/ SCr of 0.90. She also has an elevated WBC of 15.9.  She endorses slight coughing recently with occasional sputum production, but denies anything significant. Denies dysuria and urgency. However may need to check a U/A.  We will also need to monitor QTc, as telemetry indicates it is in the high 400s. Potassium is WNL. Will check Mg. It should also be noted that her CXR demonstrated a  1.1 x 1.0 cm nodular opacity in the right mid lung region. A CT has been recommended. ? Doing this w/u as an outpatient.  MD to follow.   SIMMONS, [place] 02/04/2014, 2:54 PM   I have examined the patient and reviewed assessment and plan and discussed with patient.  Agree with above as stated.  Try to maintain NSR as loss of atrial kick may contribute to diastolic heart failure.  Need to avoid spikes in BP as well.  Intolerant to amiodarone in the past.  Not a candidate for Tikosyn.  Could consider Multaq based on Dr. Doug Sou last note.  Sandra Tellefsen S.

## 2014-02-04 NOTE — ED Notes (Signed)
Per EMS: pt from home found in resp distress; pt initially placed on CPAP and was tolerating; pt then became unresponsive and then was baged; pt tachyonic and labored upon arrival; pt initial O2 sat was 60%

## 2014-02-04 NOTE — ED Notes (Signed)
NOTIFIED DR. BEATON IN PERSON OF PATIENTS PANIC I-STAT TROPONIN ,02/04/2014.

## 2014-02-04 NOTE — ED Notes (Signed)
Positive troponin reported to cards

## 2014-02-04 NOTE — ED Provider Notes (Signed)
I saw and evaluated the patient, reviewed the resident's note and I agree with the findings and plan.   .Face to face Exam:  General:  Awake HEENT:  Atraumatic Resp:  Normal effort Abd:  Nondistended Neuro:No focal weakness  Dot Lanes, MD 02/04/14 [phone]

## 2014-02-04 NOTE — ED Notes (Signed)
Cardiology at bedside.

## 2014-02-04 NOTE — ED Provider Notes (Signed)
CSN: [id number]     Arrival date & time 02/04/14  1109 History   First MD Initiated Contact with Patient 02/04/14 1110     Chief Complaint  Patient presents with  . Respiratory Distress     (Consider location/radiation/quality/duration/timing/severity/associated sxs/prior Treatment) Patient is a 80 y.o. female presenting with shortness of breath.  Shortness of Breath Severity:  Moderate Onset quality:  Gradual Duration:  2 hours Timing:  Constant Progression:  Worsening Chronicity:  New Context: not activity, not animal exposure, not emotional upset, not fumes, not known allergens, not occupational exposure, not pollens, not smoke exposure, not strong odors, not URI and not weather changes   Relieved by:  None tried Worsened by:  Nothing tried Ineffective treatments:  None tried Associated symptoms: chest pain, cough and diaphoresis   Associated symptoms: no abdominal pain, no claudication, no ear pain, no fever, no headaches, no hemoptysis, no neck pain, no PND, no rash, no sore throat, no sputum production, no syncope, no swollen glands, no vomiting and no wheezing     Past Medical History  Diagnosis Date  . Atrial fibrillation 04/23-24/2007    a. recurrent PAF with RVR in September 2013. b. Evaluated 03/2013, previously intolerant to Norpace and Amiodarone - consider Multaq if recurs. c. Not on anticoag due to history of falls and also some internal bleeding per son.  . Hypertrophic cardiomyopathy   . Urinary incontinence   . Diverticulosis of colon (without mention of hemorrhage) 2003/ 08/2000    EGD/colonoscopy Barretts esophagus//H.H divertics 08/2000  . Cervical mass     C2 lateral mass fracture  . Hypertension   . Hypercholesterolemia     219/497  . Hypothyroidism   . Multinodular goiter (nontoxic)   . Osteoporosis   . Blood transfusion   . Jaundice ~ 1935    "in grade school"  . Degenerative joint disease     back  . Depression     "husband died January 26, 2011"  .  Anemia, iron deficiency   . Personal history of colonic polyps 02/29/2012    tubular adenoma  . Barrett's esophagus   . CAD (coronary artery disease)     a. NSTEMI 03/2013: secondary to diagonal disease (small, not amenable to PCI, for med rx).  . Moderate mitral regurgitation 2014  . Mild aortic stenosis 2014   Past Surgical History  Procedure Laterality Date  . Bladder surgery      bladder tack early 90's  . Tear duct probing  07/29/03    tear duct surg  . Cystourethroscopy  09/17/03  . Rotator cuff repair  ? date; 09/07/05    left; right( Dr. Gladstone Lighter)  . Appendectomy  1941  . Breast surgery  1981    breast reduction  . Eye surgery  03/2002    cataract OS  . Thyroid ultrasound  10/14/2003    MNG, no dominant masses  . Doppler echocardiography  03/05/2002&09/11/2003    ECHO, EF wnl, mild stenosis, A.S. mild MR, Mild T.R03/31/2003//ECHO EF 70%,LVH, ?diast dysfunction 09/11/2003  . Cataract extraction w/ intraocular lens  implant, bilateral  2003  . Dilation and curettage of uterus  09/07/2000    endometrial polyps removed, path all benign   . Tonsillectomy and adenoidectomy      "as a child"  . Fracture surgery  2010    right knee   Family History  Problem Relation Age of Onset  . Heart failure Mother     CHF, DM, HBP  . Hypertension Mother   .  Uterine cancer Mother   . Stroke Mother   . Colon cancer Neg Hx   . Esophageal cancer Neg Hx   . Rectal cancer Neg Hx   . Stomach cancer Neg Hx    History  Substance Use Topics  . Smoking status: Former Smoker -- 0.50 packs/day for 4 years    Types: Cigarettes    Quit date: 07/06/1974  . Smokeless tobacco: Never Used  . Alcohol Use: No   OB History   Grav Para Term Preterm Abortions TAB SAB Ect Mult Living                 Review of Systems  Constitutional: Positive for diaphoresis. Negative for fever, chills, activity change and appetite change.  HENT: Negative for congestion, ear pain, rhinorrhea and sore throat.    Eyes: Negative for pain.  Respiratory: Positive for cough and shortness of breath. Negative for hemoptysis, sputum production and wheezing.   Cardiovascular: Positive for chest pain. Negative for palpitations, claudication, syncope and PND.  Gastrointestinal: Negative for nausea, vomiting and abdominal pain.  Genitourinary: Negative for dysuria, difficulty urinating and pelvic pain.  Musculoskeletal: Negative for back pain and neck pain.  Skin: Negative for rash and wound.  Neurological: Negative for weakness and headaches.  Psychiatric/Behavioral: Negative for behavioral problems, confusion and agitation.      Allergies  Irbesartan; Lipitor; Norpace; Ramipril; Telmisartan-hctz; and Morphine and related  Home Medications   Current Outpatient Rx  Name  Route  Sig  Dispense  Refill  . AFLURIA PRESERVATIVE FREE injection      .         Marland Kitchen aspirin 81 MG chewable tablet   Oral   Chew 1 tablet (81 mg total) by mouth daily.         Marland Kitchen BESIVANCE 0.6 % SUSP   Both Eyes   Place 1 drop into both eyes 2 (two) times daily.          Marland Kitchen diltiazem (CARTIA XT) 180 MG 24 hr capsule   Oral   Take 1 capsule (180 mg total) by mouth daily.   90 capsule   3   . fexofenadine (ALLEGRA) 180 MG tablet   Oral   Take 180 mg by mouth daily.          . fish oil-omega-3 fatty acids 1000 MG capsule   Oral   Take 1 g by mouth 2 (two) times daily.           . hydrochlorothiazide (HYDRODIURIL) 25 MG tablet   Oral   Take 12.5 mg by mouth daily. Take 1/2 tablet         . HYDROcodone-acetaminophen (NORCO/VICODIN) 5-325 MG per tablet   Oral   Take 0.5-1 tablets by mouth 2 (two) times daily as needed.   90 tablet   0   . levothyroxine (SYNTHROID, LEVOTHROID) 25 MCG tablet   Oral   Take 25 mcg by mouth every morning.         Marland Kitchen levothyroxine (SYNTHROID, LEVOTHROID) 25 MCG tablet      TAKE 1 TABLET EVERY MORNING   90 tablet   0   . losartan (COZAAR) 50 MG tablet   Oral   Take 1  tablet (50 mg total) by mouth daily.   90 tablet   3     .Marland KitchenPatient needs to contact office to schedule  App ...   . Multiple Vitamins-Minerals (OCUVITE PRESERVISION) TABS   Oral   Take 1 tablet by  mouth daily.           . nitroGLYCERIN (NITROSTAT) 0.4 MG SL tablet   Sublingual   Place 1 tablet (0.4 mg total) under the tongue every 5 (five) minutes as needed for chest pain (up to 3 doses).   25 tablet   3   . omeprazole (PRILOSEC) 40 MG capsule   Oral   Take 1 capsule (40 mg total) by mouth daily.   90 capsule   3   . zolpidem (AMBIEN) 10 MG tablet   Oral   Take 1 tablet (10 mg total) by mouth at bedtime as needed for sleep.   30 tablet   2    BP 128/52  Pulse 73  Resp 22  SpO2 99% Physical Exam  Constitutional: She is oriented to person, place, and time. She appears well-developed and well-nourished. She appears distressed.  HENT:  Head: Normocephalic and atraumatic.  Nose: Nose normal.  Mouth/Throat: Oropharynx is clear and moist.  Eyes: EOM are normal. Pupils are equal, round, and reactive to light.  Neck: Normal range of motion. Neck supple. No tracheal deviation present.  Cardiovascular: Regular rhythm, normal heart sounds and intact distal pulses.   Tachycardic   Pulmonary/Chest: She is in respiratory distress. She has rales ( blt).  Abdominal: Soft. Bowel sounds are normal. She exhibits no distension. There is no tenderness. There is no rebound and no guarding.  Musculoskeletal: Normal range of motion. She exhibits no tenderness.  Neurological: She is alert and oriented to person, place, and time.  Skin: Skin is warm and dry. No rash noted.  Psychiatric: She has a normal mood and affect. Her behavior is normal.    ED Course  Procedures (including critical care time) Labs Review Labs Reviewed  CBC WITH DIFFERENTIAL - Abnormal; Notable for the following:    WBC 15.9 (*)    Hemoglobin 11.2 (*)    MCH 25.0 (*)    Neutrophils Relative % 92 (*)    Neutro  Abs 14.6 (*)    Lymphocytes Relative 4 (*)    Lymphs Abs 0.6 (*)    All other components within normal limits  I-STAT CHEM 8, ED - Abnormal; Notable for the following:    Glucose, Bld 255 (*)    All other components within normal limits  I-STAT CG4 LACTIC ACID, ED - Abnormal; Notable for the following:    Lactic Acid, Venous 4.03 (*)    All other components within normal limits  I-STAT ARTERIAL BLOOD GAS, ED - Abnormal; Notable for the following:    pH, Arterial 7.305 (*)    pO2, Arterial 147.0 (*)    Acid-base deficit 5.0 (*)    All other components within normal limits  I-STAT ARTERIAL BLOOD GAS, ED - Abnormal; Notable for the following:    pO2, Arterial 148.0 (*)    All other components within normal limits  I-STAT TROPOININ, ED - Abnormal; Notable for the following:    Troponin i, poc 0.53 (*)    All other components within normal limits  URINALYSIS, ROUTINE W REFLEX MICROSCOPIC  BLOOD GAS, ARTERIAL  PRO B NATRIURETIC PEPTIDE  BLOOD GAS, ARTERIAL  I-STAT TROPOININ, ED   Imaging Review Dg Chest Portable 1 View  02/04/2014   CLINICAL DATA:  Shortness of Breath  EXAM: PORTABLE CHEST - 1 VIEW  COMPARISON:  March 26, 2013  FINDINGS: There is cardiomegaly with interstitial edema and small bilateral effusions. There is a 1.1 x 1.0 cm nodular opacity in the  right mid lung region. There is pulmonary venous hypertension. There is no appreciable airspace consolidation.  There is a large hiatal type hernia.  There is atherosclerotic change in the aorta. There is postoperative change in the shoulders bilaterally.  IMPRESSION: Congestive heart failure.  Nodular opacity right mid lung. This is a new finding compared to prior study. This finding warrants noncontrast enhanced chest CT to further evaluate; a small neoplasm cannot be excluded in this area.  Sizable high type hernia.   Electronically Signed   By: Lowella Grip M.D.   On: 02/04/2014 11:33     EKG Interpretation   Date/Time:   Monday February 04 2014 11:19:07 EST Ventricular Rate:  81 PR Interval:  253 QRS Duration: 121 QT Interval:  420 QTC Calculation: 487 R Axis:   -50 Text Interpretation:  Sinus or ectopic atrial rhythm Prolonged PR interval  Left bundle branch block inferior lateral ST depression appears new  Abnormal EKG Confirmed by BEATON  MD, ROBERT ([id number]) on 02/04/2014 12:09:29  PM      MDM   Final diagnoses:  Flash pulmonary edema    80 yo F presents via EMS in respiratory distress. Patient is tachypneic and tachycardic on arrival. Initial O2 saturations are 60 %  11:20 AM CXR read and interpreted by me. Diffuse interstitial infiltrates with vascular congestion suggestive of pulmonary edema. Patient started on nitro gtt and BIPAP. Concerned for flash pulmonary edema.   Patient doing better on BIPAP and on nitro gtt. RR now in the 20's. Patient feeling better.   1:36 PM Discussed case with Cardiology team. Plan to evaluate patient.   Patients sxs improved significantly after treatment. Case discussed with cardiology and plan for admission. Case co managed with my attending Dr. Audie Pinto. Patient admitted to cardiology service for further care. Family and patient up dated of plan multiple times throughout ED course.   Of note repeat troponin elevated at 0.53. ASA given.       Ruthell Rummage, MD 02/04/14 [phone]

## 2014-02-05 ENCOUNTER — Encounter (HOSPITAL_COMMUNITY): Payer: Self-pay

## 2014-02-05 DIAGNOSIS — I5031 Acute diastolic (congestive) heart failure: Secondary | ICD-10-CM

## 2014-02-05 DIAGNOSIS — I160 Hypertensive urgency: Secondary | ICD-10-CM | POA: Diagnosis present

## 2014-02-05 DIAGNOSIS — I509 Heart failure, unspecified: Secondary | ICD-10-CM

## 2014-02-05 DIAGNOSIS — I059 Rheumatic mitral valve disease, unspecified: Secondary | ICD-10-CM

## 2014-02-05 DIAGNOSIS — I2510 Atherosclerotic heart disease of native coronary artery without angina pectoris: Secondary | ICD-10-CM

## 2014-02-05 DIAGNOSIS — J810 Acute pulmonary edema: Secondary | ICD-10-CM

## 2014-02-05 DIAGNOSIS — J9600 Acute respiratory failure, unspecified whether with hypoxia or hypercapnia: Secondary | ICD-10-CM | POA: Diagnosis present

## 2014-02-05 DIAGNOSIS — I359 Nonrheumatic aortic valve disorder, unspecified: Secondary | ICD-10-CM

## 2014-02-05 LAB — BASIC METABOLIC PANEL
BUN: 16 mg/dL (ref 6–23)
CO2: 28 mEq/L (ref 19–32)
Calcium: 8.6 mg/dL (ref 8.4–10.5)
Chloride: 100 mEq/L (ref 96–112)
Creatinine, Ser: 0.89 mg/dL (ref 0.50–1.10)
GFR, EST AFRICAN AMERICAN: 65 mL/min — AB (ref >90)
GFR, EST NON AFRICAN AMERICAN: 56 mL/min — AB (ref >90)
GLUCOSE: 90 mg/dL (ref 70–99)
POTASSIUM: 4.7 meq/L (ref 3.7–5.3)
SODIUM: 141 meq/L (ref 137–147)

## 2014-02-05 LAB — TROPONIN I: Troponin I: 0.58 ng/mL (ref <0.30)

## 2014-02-05 LAB — TSH: TSH: 0.737 u[IU]/mL (ref 0.350–4.500)

## 2014-02-05 MED ORDER — LOSARTAN POTASSIUM 50 MG PO TABS
50.0000 mg | ORAL_TABLET | Freq: Once | ORAL | Status: DC
  Filled 2014-02-05: qty 1

## 2014-02-05 MED ORDER — LOSARTAN POTASSIUM 50 MG PO TABS: 100.0000 mg | ORAL_TABLET | Freq: Every day | ORAL | Status: DC

## 2014-02-05 MED ORDER — LOSARTAN POTASSIUM 50 MG PO TABS
50.0000 mg | ORAL_TABLET | Freq: Two times a day (BID) | ORAL | Status: DC
  Administered 2014-02-05 – 2014-02-08 (×6): 50 mg via ORAL
  Filled 2014-02-05 (×7): qty 1

## 2014-02-05 MED ORDER — HYDROCHLOROTHIAZIDE 12.5 MG PO CAPS
12.5000 mg | ORAL_CAPSULE | Freq: Every day | ORAL | Status: DC
  Administered 2014-02-05 – 2014-02-08 (×4): 12.5 mg via ORAL
  Filled 2014-02-05 (×4): qty 1

## 2014-02-05 NOTE — Progress Notes (Signed)
UR completed 

## 2014-02-05 NOTE — Care Management Note (Addendum)
    Page 1 of 2   02/08/2014     2:57:14 PM   CARE MANAGEMENT NOTE 02/08/2014  Patient:  Cindy [name],Cindy [name]   Account Number:  [id number]  Date Initiated:  02/05/2014  Documentation initiated by:  MAYO,HENRIETTA  Subjective/Objective Assessment:   dx diastolic failure; lives alone    PCP Dr Elsie Stain  Cardiologist  Dr [place]     Action/Plan:   Anticipated DC Date:  02/08/2014   Anticipated DC Plan:  [place]  CM consult      [place]   Choice offered to / List presented to:  [name]-1 Patient        [place] arranged  HH-1 RN  [place]   Status of service:  Completed, signed off Medicare Important Message given?   (If response is "NO", the following Medicare IM given date fields will be blank) Date Medicare IM given:   Date Additional Medicare IM given:    Discharge Disposition:  [place]  Per UR Regulation:  Reviewed for med. necessity/level of care/duration of stay  If discussed at [place] of Stay Meetings, dates discussed:    Comments:  Contact:  Klaire Court, son  [phone]                 Gena Laski, son  [phone]  02/08/14 Antoine Primas [phone] [place]; Ashley UP WITH PT AT [facility] MGMT.  02/05/14 1415 [place] RN MSN BSN CCM Pt and sons request home health RN for CHF mgmt, would prefer [facility] as pt had services from that agency previously.   Referral called to [facility]. Pt and son state that she does not want PT or OT - per son, pt is very independent and manages all ADLs, continues to prepare meals and do some housekeeping.  They are hiring someone privately to assist as needed with IADLs.

## 2014-02-05 NOTE — Progress Notes (Signed)
  Echocardiogram 2D Echocardiogram has been performed.  Arliene Rosenow FRANCES 02/05/2014, 5:51 PM

## 2014-02-05 NOTE — Progress Notes (Signed)
Patient Name: Cindy [name] Date of Encounter: 02/05/2014   Principal Problem:   Acute on chronic diastolic CHF (congestive heart failure) Active Problems:   HYPOTHYROIDISM   Hypertrophic obstructive cardiomyopathy(425.11)   CAD (coronary artery disease)   PAF (paroxysmal atrial fibrillation)   Hypertensive urgency   Acute respiratory failure    SUBJECTIVE  Breathing much improved.  No chest pain.  CURRENT MEDS . aspirin  81 mg Oral Daily  . diltiazem  180 mg Oral Daily  . gatifloxacin  1 drop Both Eyes QID  . heparin  5,000 Units Subcutaneous 3 times per day  . hydrochlorothiazide  12.5 mg Oral Daily  . levothyroxine  25 mcg Oral q morning - 10a  . [START ON 02/06/2014] losartan  100 mg Oral Daily  . losartan  50 mg Oral Once  . omega-3 acid ethyl esters  1 g Oral BID  . pantoprazole  80 mg Oral Daily  . sodium chloride  3 mL Intravenous Q12H   OBJECTIVE  Filed Vitals:   02/05/14 0600 02/05/14 0730 02/05/14 1006 02/05/14 1133  BP: 149/47 150/54 138/57 128/89  Pulse: 75 71  69  Temp:  97.5 F (36.4 C)  98.7 F (37.1 C)  TempSrc:  Oral  Oral  Resp: 19 23  19   Height:      Weight:      SpO2: 96% 99%  99%    Intake/Output Summary (Last 24 hours) at 02/05/14 1524 Last data filed at 02/05/14 1134  Gross per 24 hour  Intake    483 ml  Output   4475 ml  Net  -3992 ml   Filed Weights   02/04/14 1823 02/04/14 2000 02/05/14 0354  Weight: 132 lb 4.4 oz (60 kg) 132 lb 4.4 oz (60 kg) 128 lb 12 oz (58.4 kg)   PHYSICAL EXAM  General: Pleasant, NAD. Neuro: Alert and oriented X 3. Moves all extremities spontaneously. Psych: Normal affect. HEENT:  HOH, otw normal  Neck: Supple.  Radiated murmur noted on right with bounding upstroke.  Neck veins are flat. Lungs:  Resp regular and unlabored, diminished breath sounds bilat. Heart: RRR no s3, s4, 3/6 SEM loudest @ bilat USB, heard throughout. Abdomen: Soft, non-tender, non-distended, BS + x 4.  Extremities: No  clubbing, cyanosis or edema. DP/PT/Radials 2+ and equal bilaterally.  Accessory Clinical Findings  CBC  Recent Labs  02/04/14 1150 02/04/14 1215 02/04/14 1900  WBC 15.9*  --  9.5  NEUTROABS 14.6*  --   --   HGB 11.2* 13.9 11.0*  HCT 36.5 41.0 35.3*  MCV 81.5  --  79.9  PLT 240  --  433   Basic Metabolic Panel  Recent Labs  02/04/14 1215 02/04/14 1900 02/05/14 0223  NA 137  --  141  K 3.8  --  4.7  CL 99  --  100  CO2  --   --  28  GLUCOSE 255*  --  90  BUN 15  --  16  CREATININE 0.90 0.78 0.89  CALCIUM  --   --  8.6  MG  --  2.1  --     TELE  Rsr, 1st deg avb, freq pac's, occas pvc's.  Radiology/Studies  Dg Chest Portable 1 View  02/04/2014   CLINICAL DATA:  Shortness of Breath  EXAM: PORTABLE CHEST - 1 VIEW  COMPARISON:  March 26, 2013  FINDINGS: There is cardiomegaly with interstitial edema and small bilateral effusions. There is a 1.1 x 1.0  cm nodular opacity in the right mid lung region. There is pulmonary venous hypertension. There is no appreciable airspace consolidation.  There is a large hiatal type hernia.  There is atherosclerotic change in the aorta. There is postoperative change in the shoulders bilaterally.  IMPRESSION: Congestive heart failure.  Nodular opacity right mid lung. This is a new finding compared to prior study. This finding warrants noncontrast enhanced chest CT to further evaluate; a small neoplasm cannot be excluded in this area.  Sizable high type hernia.   Electronically Signed   By: Lowella Grip M.D.   On: 02/04/2014 11:33   ASSESSMENT AND PLAN  1.  HTN Urgency/Acute on chronic diastolic CHF/Hypertrophic Cardiomyopathy:  Pt presented yesterday with fairly abrupt onset of dyspnea and hypoxia in the setting of marked HTN with systolics in the 366'Y.  She responded well to BiPap and IV lasix and is minus 3.7 L overnight.  Her weight yesterday wasn't significantly above previous recordings and is now below baseline.  She is breathing much  easier.  BP's have been running 130's-150's/40's-90's.  She is euvolemic on exam.  She denies any recent salt load or dietary indiscretion.  No chest pain (troponins not checked).  Cont dilt and losartan (push to 100mg  daily).  D/c foley.  No further diuresis (on oral HCTZ chronically).  Ambulate today.  Family is interested in pursuing a sitter for her @ home.  I've asked CM to see.  2.  R Mid Lung nodular opacity:  Noted on CXR.  Discussed with pt and family.  Will order noncontrast enhanced CT to further eval.  3.  PAF:  In sinus with wide 1st deg AVB.  Cont dilt.  Not on anticoagulation 2/2 h/o GIB and falls.  She has previously been intolerant to amio and norpace.  She is not a tikosyn candidate 2/2 prolonged QTc.  If she were to develop recurrent Afib, multaq was to be considered however with admission for symptomatic CHF, this is no longer a feasible option.  4. CAD: no chest pain.  Known small vessel dzs.  POC trop nl.  F/u trop this AM.  Also f/u ECG as she had inflat ST depressions yesterday.  Unclear at this point what role CAD may have been playing in acute pulm edema and HTN urgency.  Cont ASA.  5.  Hypothyroidism:  Cont synthroid.  F/U TSH.  Signed, Rogelia Mire, NP  Troponin [facility] of Care Test)  Recent Labs  02/04/14 1710  TROPIPOC 0.78*   BNP (last 3 results)  Recent Labs  02/04/14 1819  PROBNP 782.8*   Troponin .58 on 3/3 at 13:56  Patient seen and examined. Agree with assessment and plan. Breathing better. Trop minimally positive. Exam c/w AS murmur. Last echo 4/14 with mod LVH, EF 60-65% with grade 2 diastolic dysfunction and AVA 1.3 cm2. With admission for CHF will reasses echo. Currently NSR in 70's.  ECG NSR with PAC's and PVC's and 1st degree AV block. Will titrate losartan to 50 mg bid regimen. F/U lung nodule.   Troy Sine, MD, [facility] 02/05/2014 3:24 PM

## 2014-02-05 NOTE — Progress Notes (Signed)
CRITICAL VALUE ALERT  Critical value received: Trop 0.58  Date of notification: 0/02/2014  Time of notification:  0626  Critical value read back:yes  Nurse who received alert:  Will Bonnet  MD notified (1st page):  Dr. Claiborne Billings on rounds  Time of first page:  1530  MD notified (2nd page):  Time of second page:  Responding MD:  Dr. Claiborne Billings Time MD responded: [phone]

## 2014-02-05 NOTE — Progress Notes (Signed)
Report of Trop 0.58 reported to Dr. Claiborne Billings on rounds.

## 2014-02-06 DIAGNOSIS — I4891 Unspecified atrial fibrillation: Secondary | ICD-10-CM

## 2014-02-06 LAB — BASIC METABOLIC PANEL
BUN: 18 mg/dL (ref 6–23)
CHLORIDE: 96 meq/L (ref 96–112)
CO2: 22 mEq/L (ref 19–32)
CREATININE: 0.73 mg/dL (ref 0.50–1.10)
Calcium: 8.9 mg/dL (ref 8.4–10.5)
GFR calc Af Amer: 85 mL/min — ABNORMAL LOW (ref >90)
GFR calc non Af Amer: 74 mL/min — ABNORMAL LOW (ref >90)
Glucose, Bld: 94 mg/dL (ref 70–99)
POTASSIUM: 4.7 meq/L (ref 3.7–5.3)
Sodium: 133 mEq/L — ABNORMAL LOW (ref 137–147)

## 2014-02-06 NOTE — Progress Notes (Signed)
Subjective: Sitting up eating breakfast. Denies any chest pain. Breathing has improved significantly. Currently off of nasal canula.   Objective: Vital signs in last 24 hours: Temp:  [97.8 F (36.6 C)-98.7 F (37.1 C)] 98.1 F (36.7 C) (03/04 0800) Pulse Rate:  [53-70] 70 (03/04 0800) Resp:  [13-26] 18 (03/04 0800) BP: (102-160)/(31-100) 139/55 mmHg (03/04 0800) SpO2:  [95 %-100 %] 99 % (03/04 0840) Weight:  [128 lb 12 oz (58.4 kg)] 128 lb 12 oz (58.4 kg) (03/04 0357) Last BM Date: 02/04/14  Intake/Output from previous day: 03/03 0701 - 03/04 0700 In: 366 [P.O.:360; I.V.:6] Out: 475 [Urine:475] Intake/Output this shift:    Medications Current Facility-Administered Medications  Medication Dose Route Frequency Provider Last Rate Last Dose  . 0.9 %  sodium chloride infusion  250 mL Intravenous PRN Brittainy Simmons, PA-C      . aspirin chewable tablet 81 mg  81 mg Oral Daily Brittainy Simmons, PA-C   81 mg at 02/05/14 1004  . diltiazem (CARDIZEM CD) 24 hr capsule 180 mg  180 mg Oral Daily Brittainy Simmons, PA-C   180 mg at 02/05/14 1006  . gatifloxacin (ZYMAXID) 0.5 % ophthalmic drops 1 drop  1 drop Both Eyes QID Brittainy Simmons, PA-C   1 drop at 02/05/14 2132  . heparin injection 5,000 Units  5,000 Units Subcutaneous 3 times per day Lyda Jester, PA-C   5,000 Units at 02/06/14 0645  . hydrochlorothiazide (MICROZIDE) capsule 12.5 mg  12.5 mg Oral Daily Casandra Doffing, MD   12.5 mg at 02/05/14 1004  . levothyroxine (SYNTHROID, LEVOTHROID) tablet 25 mcg  25 mcg Oral q morning - 10a Brittainy Simmons, PA-C   25 mcg at 02/05/14 1004  . losartan (COZAAR) tablet 50 mg  50 mg Oral BID Troy Sine, MD   50 mg at 02/05/14 2000  . nitroGLYCERIN (NITROSTAT) SL tablet 0.4 mg  0.4 mg Sublingual Q5 min PRN Brittainy Simmons, PA-C      . omega-3 acid ethyl esters (LOVAZA) capsule 1 g  1 g Oral BID Casandra Doffing, MD   1 g at 02/05/14 2133  . pantoprazole (PROTONIX) EC tablet 80 mg  80  mg Oral Daily Casandra Doffing, MD   80 mg at 02/05/14 1013  . sodium chloride 0.9 % injection 3 mL  3 mL Intravenous Q12H Brittainy Simmons, PA-C   3 mL at 02/05/14 2134  . sodium chloride 0.9 % injection 3 mL  3 mL Intravenous PRN Brittainy Simmons, PA-C      . zolpidem (AMBIEN) tablet 5 mg  5 mg Oral QHS PRN Lyda Jester, PA-C   5 mg at 02/05/14 2134    PE: General appearance: alert, cooperative and no distress Lungs: clear to auscultation bilaterally Heart: regular rate and rhythm and 3/6 SM heard throughout the precordium  Extremities: no LEE Pulses: 2+ and symmetric Skin: warm and dry' Neurologic: Grossly normal  Lab Results:   Recent Labs  02/04/14 1150 02/04/14 1215 02/04/14 1900  WBC 15.9*  --  9.5  HGB 11.2* 13.9 11.0*  HCT 36.5 41.0 35.3*  PLT 240  --  217   BMET  Recent Labs  02/04/14 1215 02/04/14 1900 02/05/14 0223 02/06/14 0300  NA 137  --  141 133*  K 3.8  --  4.7 4.7  CL 99  --  100 96  CO2  --   --  28 22  GLUCOSE 255*  --  90 94  BUN 15  --  16 18  CREATININE 0.90 0.78 0.89 0.73  CALCIUM  --   --  8.6 8.9   Cardiac Panel (last 3 results)  Recent Labs  02/05/14 1356  TROPONINI 0.58*     Studies  2D echo 02/05/14 Study Conclusions  - Left ventricle: The cavity size was normal. There was moderate concentric hypertrophy. Systolic function was normal. The estimated ejection fraction was in the range of 60% to 65%. Wall motion was normal; there were no regional wall motion abnormalities. Features are consistent with a pseudonormal left ventricular filling pattern, with concomitant abnormal relaxation and increased filling pressure (grade 2 diastolic dysfunction). - Aortic valve: Severe thickening and calcification. There was mild stenosis. Trivial regurgitation. Valve area: 1.18cm^2(VTI). Valve area: 0.96cm^2 (Vmax). - Mitral valve: There appears to be a mobile density off of the anterior MV leaflet that is only appreciated in  the apical views and may represent a vegetation. Recommend TEE for further evaluation as well as determine the etiology  of significant MR Moderately calcified annulus. Moderate to severe regurgitation directed eccentrically and toward the septum. Valve area by continuity equation (using LVOT flow): 1.53cm^2. - Left atrium: The atrium was mildly dilated. - Right ventricle: The cavity size was mildly dilated. - Tricuspid valve: Moderate regurgitation. - Pulmonary arteries: PA peak pressure: 30mm Hg (S). Impressions:  - The right ventricular systolic pressure was increased consistent with mild pulmonary hypertension.   Assessment/Plan  Principal Problem:   Acute on chronic diastolic CHF (congestive heart failure) Active Problems:   HYPOTHYROIDISM   Hypertrophic obstructive cardiomyopathy(425.11)   CAD (coronary artery disease)   PAF (paroxysmal atrial fibrillation)   Hypertensive urgency   Acute respiratory failure  Plan: SOB resolved. She is off supplemental oxygen. O2 sats at 93% on RA. She is euvolemic on exam. Continue daily HCTZ. Renal function is normal. 2D echo yesterday demonstrated normal systolic function w/ an EF of 60-65%, no WMA. Grade 2 Diastolic dysfunction was noted. She has had a slight change in AVA, compared to prior study. AVR was measured at 1.18 cm2 compared to 1.3 cm2 at last study in April 03/2013. She denies CP, SOB, and syncope. BP is improved with increase in losartan. Most recent BP is 139/55. Telemetry shows NSR. HR in the 70s. Continue Cardizem. No anticoagulation due to h/o of falls and GIBs. ? If we will pursue lung nodule w/u. This was discussed yesterday but CT was never ordered. MD to determine definite plan.     LOS: 2 days    Brittainy M. Ladoris Gene 02/06/2014 8:46 AM   Patient seen and examined. Agree with assessment and plan. Breathing better. AVG peak and mean gradients 29 and 17 mm Hg with AVA 1.18 cm2. Tolerating 50 mg BID regimen of  losartan. Had discussion with patient and son. Pt does not want to pursue lung nodule w/u. Ambulat as tolerates; ? Dc in 24-48 hrs if stable.   Troy Sine, MD, [facility] 02/06/2014 9:39 AM

## 2014-02-06 NOTE — Progress Notes (Signed)
Patient changed to inpatient- con't to require monitoring + trop

## 2014-02-07 DIAGNOSIS — I059 Rheumatic mitral valve disease, unspecified: Secondary | ICD-10-CM

## 2014-02-07 LAB — BASIC METABOLIC PANEL
BUN: 18 mg/dL (ref 6–23)
CHLORIDE: 93 meq/L — AB (ref 96–112)
CO2: 26 meq/L (ref 19–32)
Calcium: 8.8 mg/dL (ref 8.4–10.5)
Creatinine, Ser: 0.75 mg/dL (ref 0.50–1.10)
GFR calc Af Amer: 84 mL/min — ABNORMAL LOW (ref >90)
GFR calc non Af Amer: 73 mL/min — ABNORMAL LOW (ref >90)
Glucose, Bld: 94 mg/dL (ref 70–99)
Potassium: 3.6 mEq/L — ABNORMAL LOW (ref 3.7–5.3)
SODIUM: 132 meq/L — AB (ref 137–147)

## 2014-02-07 MED ORDER — HYDROCODONE-ACETAMINOPHEN 5-325 MG PO TABS
0.5000 | ORAL_TABLET | Freq: Two times a day (BID) | ORAL | Status: DC | PRN
  Administered 2014-02-07 (×2): 1 via ORAL
  Filled 2014-02-07 (×2): qty 1

## 2014-02-07 MED ORDER — POTASSIUM CHLORIDE CRYS ER 20 MEQ PO TBCR
20.0000 meq | EXTENDED_RELEASE_TABLET | Freq: Once | ORAL | Status: AC
  Administered 2014-02-07: 20 meq via ORAL
  Filled 2014-02-07: qty 1

## 2014-02-07 NOTE — Progress Notes (Signed)
Called report to [facility] spoke with RN [place]. Pt to go to room 01C

## 2014-02-07 NOTE — Progress Notes (Signed)
Pt transferred per w/c to [address] 01 with all belongings. RN in room placed on telemetry box and called CMT

## 2014-02-07 NOTE — Progress Notes (Signed)
CARDIAC REHAB PHASE I   PRE:  Rate/Rhythm: 67 1st deg with Pvc    BP: sitting 147/48    SaO2: 97 RA  MODE:  Ambulation: 300 ft   POST:  Rate/Rhythm: 100 with many PACs/PVCs vs afib    BP: sitting 135/54     SaO2: 95 RA  Pt fairly steady with RW (short). Some SOB noted, pt with good pace. Did not want to rest but eventually did at 1/2 way to slow breathing. Sts she normally gets SOB with walking and that she just tries to slow herself down (she is accustomed to a quicker pace). SaO2 good on RA. HR increased, difficult to tell on portable monitor if Afib vs 1st deg with ectopy. HR slowed after sitting and became more regular. To recliner. Will f/u am. [phone]  Cindy [name] [place] CES, ACSM 02/07/2014 2:48 PM

## 2014-02-07 NOTE — Progress Notes (Signed)
Subjective: No complaints except weakness   Objective: Vital signs in last 24 hours: Temp:  [97.6 F (36.4 C)-97.9 F (36.6 C)] 97.8 F (36.6 C) (03/05 0330) Pulse Rate:  [59-69] 59 (03/05 0330) Resp:  [15-24] 22 (03/05 0330) BP: (118-143)/(37-56) 140/50 mmHg (03/05 0904) SpO2:  [93 %-98 %] 98 % (03/05 0330) Weight:  [128 lb 12 oz (58.4 kg)] 128 lb 12 oz (58.4 kg) (03/05 0330) Weight change: 0 lb (0 kg) Last BM Date: 02/06/14 Intake/Output from previous day: -577 (-4428 since admit)  Wt 128.12 down from 132.4  On admit 03/04 0701 - 03/05 0700 In: 1203 [P.O.:1200; I.V.:3] Out: 1780 [Urine:1780] Intake/Output this shift: Total I/O In: 350 [P.O.:350] Out: -   PE: General:Pleasant affect, NAD Skin:Warm and dry, brisk capillary refill HEENT:normocephalic, sclera clear, mucus membranes moist Neck:supple, mild JVD Heart:S1S2 RRR with 2-6/7 systolic murmur, no gallup, rub or click Lungs:clear without rales, rhonchi, or wheezes TIW:PYKD, non tender, + BS, do not palpate liver spleen or masses Ext:no lower ext edema, 2+ pedal pulses, 2+ radial pulses Neuro:alert and oriented, MAE, follows commands, + facial symmetry  tele:  SR with PACs  Lab Results:  Recent Labs  02/04/14 1150 02/04/14 1215 02/04/14 1900  WBC 15.9*  --  9.5  HGB 11.2* 13.9 11.0*  HCT 36.5 41.0 35.3*  PLT 240  --  217   BMET  Recent Labs  02/06/14 0300 02/07/14 0230  NA 133* 132*  K 4.7 3.6*  CL 96 93*  CO2 22 26  GLUCOSE 94 94  BUN 18 18  CREATININE 0.73 0.75  CALCIUM 8.9 8.8    Recent Labs  02/05/14 1356  TROPONINI 0.58*    No results found for this basename: HGBA1C     Lab Results  Component Value Date   TSH 0.737 02/05/2014     Studies/Results: 2D Echo: Left ventricle: The cavity size was normal. There was moderate concentric hypertrophy. Systolic function was normal. The estimated ejection fraction was in the range of 60% to 65%. Wall motion was normal; there  were no regional wall motion abnormalities. Features are consistent with a pseudonormal left ventricular filling pattern, with concomitant abnormal relaxation and increased filling pressure (grade 2 diastolic dysfunction). - Aortic valve: Severe thickening and calcification. There was mild stenosis. Trivial regurgitation. Valve area: 1.18cm^2(VTI). Valve area: 0.96cm^2 (Vmax). - Mitral valve: There appears to be a mobile density off of the anterior MV leaflet that is only appreciated in the apical views and may represent a vegetation. Recommend TEE for further evaluation as well as determine the etiology of significant MR Moderately calcified annulus. Moderate to severe regurgitation directed eccentrically and toward the septum. Valve area by continuity equation (using LVOT flow): 1.53cm^2. - Left atrium: The atrium was mildly dilated. - Right ventricle: The cavity size was mildly dilated. - Tricuspid valve: Moderate regurgitation. - Pulmonary arteries: PA peak pressure: 22mm Hg (S). Impressions:  - The right ventricular systolic pressure was increased consistent with mild pulmonary hypertension  Medications: I have reviewed the patient's current medications. Scheduled Meds: . aspirin  81 mg Oral Daily  . diltiazem  180 mg Oral Daily  . gatifloxacin  1 drop Both Eyes QID  . heparin  5,000 Units Subcutaneous 3 times per day  . hydrochlorothiazide  12.5 mg Oral Daily  . levothyroxine  25 mcg Oral q morning - 10a  . losartan  50 mg Oral BID  . omega-3 acid ethyl esters  1 g Oral BID  . pantoprazole  80 mg Oral Daily  . sodium chloride  3 mL Intravenous Q12H   Continuous Infusions:  PRN Meds:.sodium chloride, nitroGLYCERIN, sodium chloride, zolpidem  Assessment/Plan: Principal Problem:   Acute on chronic diastolic CHF (congestive heart failure) Active Problems:   HYPOTHYROIDISM   Hypertrophic obstructive cardiomyopathy(425.11)   CAD (coronary artery disease)   PAF  (paroxysmal atrial fibrillation)   Acute diastolic CHF (congestive heart failure)   Hypertensive urgency   Acute respiratory failure  PLAN: ? mobile density off of the ant MV leaflet possible vegetation ? TEE. Significant MR mod to severe.    Improved HF.  Troponin elevated due to HF.  She does not wish to have home health.  Her family has someone they will have help her at home.    Pt rec'd 2 doses of IV lasix and is now back on HCTZ at 12.5 mg daily. No lasix. Will ask cardiac rehab to ambulate.    Pulmonary nodule, pt does not wish to have evaluated.  LOS: 3 days   Time spent with pt. :15 minutes. [facility]  Nurse Practitioner Certified Pager [phone] or after 5pm and on weekends call [phone] 02/07/2014, 9:27 AM   Patient seen and examined. Agree with assessment and plan. Breathing better. I/O:- 573 yesterday and  -2202 since admission. No fevers, chills, or night sweats. Doubt vegetation. She had mod MR on her echo 1 yr ago. AS with NL LV systolic fxn and 17 mm mean and 29 mm peak  Gradients. Cr .75.  Tolerating losartan 50 mg bid with HCTZ 12.5 mg.  Inc to 25 mg if is + I/O today. Plan DC tomorrow if stable.   Troy Sine, MD, [facility] 02/07/2014 10:22 AM

## 2014-02-08 ENCOUNTER — Encounter (HOSPITAL_COMMUNITY): Payer: Self-pay | Admitting: Cardiology

## 2014-02-08 DIAGNOSIS — R911 Solitary pulmonary nodule: Secondary | ICD-10-CM | POA: Diagnosis present

## 2014-02-08 DIAGNOSIS — I340 Nonrheumatic mitral (valve) insufficiency: Secondary | ICD-10-CM | POA: Diagnosis present

## 2014-02-08 DIAGNOSIS — I058 Other rheumatic mitral valve diseases: Secondary | ICD-10-CM | POA: Diagnosis present

## 2014-02-08 LAB — BASIC METABOLIC PANEL
BUN: 16 mg/dL (ref 6–23)
CO2: 24 meq/L (ref 19–32)
Calcium: 8.7 mg/dL (ref 8.4–10.5)
Chloride: 92 mEq/L — ABNORMAL LOW (ref 96–112)
Creatinine, Ser: 0.72 mg/dL (ref 0.50–1.10)
GFR calc Af Amer: 86 mL/min — ABNORMAL LOW (ref >90)
GFR, EST NON AFRICAN AMERICAN: 74 mL/min — AB (ref >90)
GLUCOSE: 104 mg/dL — AB (ref 70–99)
POTASSIUM: 4.2 meq/L (ref 3.7–5.3)
Sodium: 130 mEq/L — ABNORMAL LOW (ref 137–147)

## 2014-02-08 LAB — PRO B NATRIURETIC PEPTIDE: Pro B Natriuretic peptide (BNP): 768 pg/mL — ABNORMAL HIGH (ref 0–450)

## 2014-02-08 MED ORDER — LOSARTAN POTASSIUM 50 MG PO TABS: 50.0000 mg | ORAL_TABLET | Freq: Two times a day (BID) | ORAL | Status: DC

## 2014-02-08 NOTE — Progress Notes (Signed)
Subjective:  Feels well. Breathing better; no chest pain.  Objective:   Vital Signs in the last 24 hours: Temp:  [97 F (36.1 C)-98.1 F (36.7 C)] 97.5 F (36.4 C) (03/06 0500) Pulse Rate:  [60-67] 60 (03/06 0500) Resp:  [17-18] 17 (03/06 0500) BP: (136-163)/(48-64) 153/63 mmHg (03/06 0500) SpO2:  [97 %-99 %] 98 % (03/06 0500) Weight:  [126 lb 5.2 oz (57.3 kg)] 126 lb 5.2 oz (57.3 kg) (03/06 0500)  Intake/Output from previous day: 03/05 0701 - 03/06 0700 In: 800 [P.O.:800] Out: 1380 [Urine:1380]  Medications: . aspirin  81 mg Oral Daily  . diltiazem  180 mg Oral Daily  . gatifloxacin  1 drop Both Eyes QID  . heparin  5,000 Units Subcutaneous 3 times per day  . hydrochlorothiazide  12.5 mg Oral Daily  . levothyroxine  25 mcg Oral q morning - 10a  . losartan  50 mg Oral BID  . omega-3 acid ethyl esters  1 g Oral BID  . pantoprazole  80 mg Oral Daily  . sodium chloride  3 mL Intravenous Q12H       Physical Exam:   General appearance: alert, cooperative and no distress Neck: no adenopathy, no JVD, supple, symmetrical, trachea midline and thyroid not enlarged, symmetric, no tenderness/mass/nodules Lungs: clear to auscultation bilaterally Heart: RRR 2/6 AS murmur and 2-3/6 MR murmur at apex Abdomen: soft, non-tender; bowel sounds normal; no masses,  no organomegaly Extremities: no edema, redness or tenderness in the calves or thighs Neurologic: Grossly normal   Rate: 70  Rhythm: normal sinus rhythm  Lab Results:    Recent Labs  02/07/14 0230 02/08/14 0342  NA 132* 130*  K 3.6* 4.2  CL 93* 92*  CO2 26 24  GLUCOSE 94 104*  BUN 18 16  CREATININE 0.75 0.72    Recent Labs  02/05/14 1356  TROPONINI 0.58*   Hepatic Function Panel No results found for this basename: PROT, ALBUMIN, AST, ALT, ALKPHOS, BILITOT, BILIDIR, IBILI,  in the last 72 hours No results found for this basename: INR,  in the last 72 hours BNP (last 3 results)  Recent Labs   02/04/14 1819 02/08/14 0342  PROBNP 782.8* 768.0*    Lipid Panel     Component Value Date/Time   CHOL 160 08/30/2012 0440   TRIG 77 08/30/2012 0440   HDL 62 08/30/2012 0440   CHOLHDL 2.6 08/30/2012 0440   VLDL 15 08/30/2012 0440   LDLCALC 83 08/30/2012 0440    CBC    Component Value Date/Time   WBC 9.5 02/04/2014 1900   RBC 4.42 02/04/2014 1900   RBC 3.36* 01/26/2012 1358   HGB 11.0* 02/04/2014 1900   HCT 35.3* 02/04/2014 1900   PLT 217 02/04/2014 1900   MCV 79.9 02/04/2014 1900   MCH 24.9* 02/04/2014 1900   MCHC 31.2 02/04/2014 1900   RDW 15.6* 02/04/2014 1900   LYMPHSABS 0.6* 02/04/2014 1150   MONOABS 0.7 02/04/2014 1150   EOSABS 0.0 02/04/2014 1150   BASOSABS 0.0 02/04/2014 1150     Imaging:  No results found.    Assessment/Plan:   Principal Problem:   Acute on chronic diastolic CHF (congestive heart failure) Active Problems:   HYPOTHYROIDISM   Hypertrophic obstructive cardiomyopathy(425.11)   CAD (coronary artery disease)   PAF (paroxysmal atrial fibrillation)   Acute diastolic CHF (congestive heart failure)   Hypertensive urgency   Acute respiratory failure Moderately-severe MR Lung nodule  I/O -580 yesterday; -5308 since admission. Wt decreased 6  lbs since admission. BNP today 768. Cr 0.72. Will dc today. No fevers or WBC elevation. With her presentation with CHF, during hospitalization discussion brought up about consideration for valve surgery in future and patient did not feel she would pursue this; but further discussion with primary cardiologist as outpatient should  be pursued. She did not want further evaluation of her lung nodule. F/U with Dr. [place].   Troy Sine, MD, [facility] 02/08/2014, 8:26 AM

## 2014-02-08 NOTE — Discharge Summary (Signed)
Physician Discharge Summary       Patient ID: Cindy [name] MRN: [id number] DOB/AGE: 1922/07/20 80 y.o.  Admit date: 02/04/2014 Discharge date: 02/08/2014  Discharge Diagnoses:  Principal Problem:   Acute on chronic diastolic CHF (congestive heart failure) Active Problems:   HYPOTHYROIDISM   Hypertrophic obstructive cardiomyopathy(425.11)   CAD (coronary artery disease)   PAF (paroxysmal atrial fibrillation)   Acute diastolic CHF (congestive heart failure)   Hypertensive urgency   Acute respiratory failure   Lung nodule seen on imaging study, pt does not wish further work up   Mitral valve regurgitation, mod to severe with moderately calcified annulus   Mitral valve mass, density - no fevers to suggest endocarditis   Discharged Condition: good  Procedures: none  Hospital Course: 80 y/o female, followed by Dr. Martinique. She has a history of non-ST elevation myocardial infarction, 03/2013, the setting of rapid atrial fibrillation. LHC at that time demonstrated a high-grade stenosis in the first diagonal branch (moderate vessel in length but small in caliber) also with a 70% stenosis in the second diagonal. It was decided to treat her CAD medically. Her previoust 2D echo was 03/27/2013, demonstrating normal systolic function, with an EF of 60-65%, LVH with focal basal hypertrophy with a pseudonormal left ventricular filling pattern, with concomitant abnormal relaxation and increased filling pressure (grade 2 diastolic dysfunction). There was also mild AS and mild MR and MS. Other history is significant for HTN and HLD. She also has a history of intolerance to amiodarone and is not a candidate for Tikosyn due to prolonged QT. She is now on rate control medication with Cardizem. She is not a candidate for anticoagulation due to history of multiple falls and GI bleeds. Her last office visit with Dr. Martinique was 01/30/14 and she was felt to be stable from a cardiac standpoint at that time.   She  presented to the [facility] ER 02/04/14 with respiratory distress. Her symptoms first started the morning of admit and developed suddenly. She was transported via EMS. When they arrived at her home, she was in acute respitartory distress, requiring CPAP. She apparently then became unresponsive and then was bagged. She was reported to be tachypnic and had labored breathing on arrival to the ER. Her initial O2 sat was 60%. CXR demonstrates finding consistent with CHF. There is cardiomegaly with interstitial edema and small bilateral effusions. There is also a 1.1 x 1.0 cm nodular opacity in the right mid lung region. There is pulmonary venous hypertension. There was no appreciable airspace consolidation. BNP pending. POC troponin is negative, however EKG shows inferior lateral ST depressions, which appear to be new. She denies chest pain currently, but states that she occasionally has bilateral neck tightness that commonly occurs at rest, lasting ~15 minutes at a time. Her last episode of neck tightness was 02/03/14. She was also noted to be hypertensive on arrival, with SBP reported to be in the 190s. She was continued on CPAP and was given IV Lasix and IV NTG. Her BP has improved, as well as her breathing while in ER. She was then placed on supplemental O2 via [place].  By the next day she felt better and was maintaining SR.  No chest pain but trop. Mildly elevated, secondary to CHF.  Lasix was stopped as pt was diuresing with HCTZ.  Pt and family notified of R mid lung opacity, but they did not wish to have evaluated.     She continued to improve and was off oxygen.  BP was controlled with increased dose of losartan.   Echo was done with mod to severe MR, peak and mean gradients 29 and 17 mm Hg with AVA 1.18 cm2, and mobile density on mitral valve, not felt to be endocarditis without fevers.  Dr. Claiborne Billings discussed increased valve issues with pt and consideration for valve surgery in the future, but she did not wish to pursue.   She will discuss with Dr. Martinique.    She ambulated with cardiac rehab without problems, low salt diet was discussed.  By 02/08/14 she was seen and evaluated by Dr. Claiborne Billings and found ready/stable for discharge.     Consults: None  Significant Diagnostic Studies:  BNP (last 3 results)  Recent Labs  02/04/14 1819 02/08/14 0342  PROBNP 782.8* 768.0*   BMET    Component Value Date/Time   NA 130* 02/08/2014 0342   K 4.2 02/08/2014 0342   CL 92* 02/08/2014 0342   CO2 24 02/08/2014 0342   GLUCOSE 104* 02/08/2014 0342   BUN 16 02/08/2014 0342   CREATININE 0.72 02/08/2014 0342   CALCIUM 8.7 02/08/2014 0342   GFRNONAA 74* 02/08/2014 0342   GFRAA 86* 02/08/2014 0342    CBC    Component Value Date/Time   WBC 9.5 02/04/2014 1900   RBC 4.42 02/04/2014 1900   RBC 3.36* 01/26/2012 1358   HGB 11.0* 02/04/2014 1900   HCT 35.3* 02/04/2014 1900   PLT 217 02/04/2014 1900   MCV 79.9 02/04/2014 1900   MCH 24.9* 02/04/2014 1900   MCHC 31.2 02/04/2014 1900   RDW 15.6* 02/04/2014 1900   LYMPHSABS 0.6* 02/04/2014 1150   MONOABS 0.7 02/04/2014 1150   EOSABS 0.0 02/04/2014 1150   BASOSABS 0.0 02/04/2014 1150    Troponin pk of 0.78 decreased to 0.58 prior to discharge.   TSH 0.737  2D Echo: Left ventricle: The cavity size was normal. There was moderate concentric hypertrophy. Systolic function was normal. The estimated ejection fraction was in the range of 60% to 65%. Wall motion was normal; there were no regional wall motion abnormalities. Features are consistent with a pseudonormal left ventricular filling pattern, with concomitant abnormal relaxation and increased filling pressure (grade 2 diastolic dysfunction). - Aortic valve: Severe thickening and calcification. There was mild stenosis. Trivial regurgitation. Valve area: 1.18cm^2(VTI). Valve area: 0.96cm^2 (Vmax). - Mitral valve: There appears to be a mobile density off of the anterior MV leaflet that is only appreciated in the apical views and may represent a vegetation.  Recommend TEE for further evaluation as well as determine the etiology of significant MR Moderately calcified annulus. Moderate to severe regurgitation directed eccentrically and toward the septum. Valve area by continuity equation (using LVOT flow): 1.53cm^2. - Left atrium: The atrium was mildly dilated. - Right ventricle: The cavity size was mildly dilated. - Tricuspid valve: Moderate regurgitation. - Pulmonary arteries: PA peak pressure: 82mm Hg (S). Impressions:  - The right ventricular systolic pressure was increased consistent with mild pulmonary hypertension.  CXR: FINDINGS: There is cardiomegaly with interstitial edema and small bilateral effusions. There is a 1.1 x 1.0 cm nodular opacity in the right mid lung region. There is pulmonary venous hypertension. There is no appreciable airspace consolidation. There is a large hiatal type hernia. There is atherosclerotic change in the aorta. There is postoperative change in the shoulders bilaterally. IMPRESSION: Congestive heart failure. Nodular opacity right mid lung. This is a new finding compared to prior study. This finding warrants noncontrast enhanced chest CT to further evaluate; a  small neoplasm cannot be excluded in this area. Sizable high type hernia.    Discharge Exam: Blood pressure 153/63, pulse 60, temperature 97.5 F (36.4 C), temperature source Oral, resp. rate 17, height 5' (1.524 m), weight 126 lb 5.2 oz (57.3 kg), SpO2 98.00%.   Disposition: 01-Home or Self Care       Future Appointments Provider Department Dept Phone   02/19/2014 2:00 PM Burtis Junes, NP [address] Office [phone]   03/29/2014 12:30 PM Hayden Pedro, MD [place] [phone]       Medication List         aspirin 81 MG chewable tablet  Chew 1 tablet (81 mg total) by mouth daily.     BESIVANCE 0.6 % Susp  Generic drug:  Besifloxacin HCl  Place 1 drop into both eyes 2 (two)  times daily.     diltiazem 180 MG 24 hr capsule  Commonly known as:  CARTIA XT  Take 1 capsule (180 mg total) by mouth daily.     fexofenadine 180 MG tablet  Commonly known as:  ALLEGRA  Take 180 mg by mouth daily.     fish oil-omega-3 fatty acids 1000 MG capsule  Take 1 g by mouth 2 (two) times daily.     hydrochlorothiazide 25 MG tablet  Commonly known as:  HYDRODIURIL  Take 12.5 mg by mouth daily. Take 1/2 tablet     HYDROcodone-acetaminophen 5-325 MG per tablet  Commonly known as:  NORCO/VICODIN  Take 0.5-1 tablets by mouth 2 (two) times daily as needed.     levothyroxine 25 MCG tablet  Commonly known as:  SYNTHROID, LEVOTHROID  Take 25 mcg by mouth every morning.     losartan 50 MG tablet  Commonly known as:  COZAAR  Take 1 tablet (50 mg total) by mouth 2 (two) times daily.     nitroGLYCERIN 0.4 MG SL tablet  Commonly known as:  NITROSTAT  Place 1 tablet (0.4 mg total) under the tongue every 5 (five) minutes as needed for chest pain (up to 3 doses).     OCUVITE PRESERVISION Tabs  Take 1 tablet by mouth daily.     omeprazole 20 MG tablet  Commonly known as:  PRILOSEC OTC  Take 40 mg by mouth daily.     zolpidem 10 MG tablet  Commonly known as:  AMBIEN  Take 5 mg by mouth at bedtime as needed for sleep (only takes half tablet as needed).       Follow-up Information   Follow up with Peter Martinique, MD On 02/19/2014. (at 2:00PM with Truitt Merle, NP, his practitioner)    Specialty:  Cardiology   Contact information:   [address] [place]  [address] [phone]        Discharge Instructions: Weigh daily Call [phone] if weight climbs more than 3 pounds in a day or 5 pounds in a week. No salt to very little salt in your diet.  No more than 2000 mg in a day. Call if increased shortness of breath or increased swelling.  Call if any weakness.    Signed: Isaiah Serge Nurse Practitioner-Certified [facility]:  [name] 02/08/2014, 11:42 AM  Time spent on discharge :>35 minutes.

## 2014-02-08 NOTE — Progress Notes (Signed)
Discussed low sodium, daily wts and walking with pt and son. Voiced understanding. Gave HF booklet. Pt receptive. She struggles to see labels, reading materials, and her scales but sts her family will help her. Assisted pt to [facility], incontinent of urine. Assisted in clean up. Tolerated well. [phone] Yves Dill CES, ACSM 11:35 AM 02/08/2014

## 2014-02-08 NOTE — Discharge Instructions (Signed)
Weigh daily Call [phone] if weight climbs more than 3 pounds in a day or 5 pounds in a week. No salt to very little salt in your diet.  No more than 2000 mg in a day. Call if increased shortness of breath or increased swelling.  Call if any weakness.

## 2014-02-18 ENCOUNTER — Telehealth: Payer: Self-pay

## 2014-02-18 NOTE — Telephone Encounter (Signed)
Please give the order.  Thanks.   

## 2014-02-18 NOTE — Telephone Encounter (Signed)
Joann nurse with [facility] request verbal order for skilled nursing home health to see pt 2 x a week for 3 weeks and 1 x a week for 3 weeks for disease management and education; pt recently d/c from hospital for CHF exacerbation.Please advise.

## 2014-02-18 NOTE — Telephone Encounter (Signed)
Cindy [name] advised.

## 2014-02-19 ENCOUNTER — Encounter: Payer: Self-pay | Admitting: Nurse Practitioner

## 2014-02-19 ENCOUNTER — Ambulatory Visit (INDEPENDENT_AMBULATORY_CARE_PROVIDER_SITE_OTHER): Payer: Medicare Other | Admitting: Nurse Practitioner

## 2014-02-19 VITALS — BP 140/50 | HR 111 | Ht 60.0 in | Wt 131.0 lb

## 2014-02-19 DIAGNOSIS — I38 Endocarditis, valve unspecified: Secondary | ICD-10-CM

## 2014-02-19 DIAGNOSIS — I2510 Atherosclerotic heart disease of native coronary artery without angina pectoris: Secondary | ICD-10-CM

## 2014-02-19 DIAGNOSIS — I4891 Unspecified atrial fibrillation: Secondary | ICD-10-CM

## 2014-02-19 LAB — BASIC METABOLIC PANEL
BUN: 19 mg/dL (ref 6–23)
CO2: 27 mEq/L (ref 19–32)
Calcium: 8.5 mg/dL (ref 8.4–10.5)
Chloride: 91 mEq/L — ABNORMAL LOW (ref 96–112)
Creatinine, Ser: 0.8 mg/dL (ref 0.4–1.2)
GFR: 67.72 mL/min (ref >60.00)
Glucose, Bld: 103 mg/dL — ABNORMAL HIGH (ref 70–99)
Potassium: 3.4 mEq/L — ABNORMAL LOW (ref 3.5–5.1)
Sodium: 126 mEq/L — ABNORMAL LOW (ref 135–145)

## 2014-02-19 NOTE — Progress Notes (Signed)
Cindy [name] Date of Birth: 11/14/1922 Medical Record #[id number]  History of Present Illness: Cindy [name] is seen back today for a post hospital visit. Seen for Dr. Swaziland. Formerly seen by Dr. Jens [name]. She is an 80 year old female with HOCM, CAD with past NSTEMI in 03/2013 in the setting of rapid AF - has high grade stenosis in the first diagonal branch (moderate vessel in length but small in caliber) and a 70% stenosis in the 2nd DX - she was managed medically, PAF, diastolic HF, valvular heart disease and advanced age. EF is normal by echo from April of 2014 but with grade II diastolic dysfunction, mild AS, mild MR and mild MS. She is intolerant to amiodarone and not a candidate for Tikosyn due to prolonged QT - not a candidate for anticoagulation given past history of bleeding and falls.   Seen by Dr. Swaziland in February - felt to be [name].  Admitted earlier this month with an acute onset of shortness of breath and hypoxia. Treated with oxygen, CPAP and diuresis. BP quite high and she was given IV NTG. Mild elevation in troponin. CXR with lung nodule - family did not wish to work up further. Echo updated - moderate to severe MR with a mobile density on the mitral valve - no fevers and not felt to be endocarditis - she did not wish to pursue cardiac surgery.   Comes back today. Here with her son, Cindy [name]. Has been home almost 2 weeks. Seems to be holding her own. Son feels like she is doing ok. Breathing is fair. No swelling. Weight is fairly [name]. She has lots of help that comes in to the home. Resistant to moving in with her family. She says her greatest pleasure is watching [place] play basketball. She notes that she is taking 1/2 the HCTZ twice a day - she wishes to continue.    Current Outpatient Prescriptions  Medication Sig Dispense Refill  . aspirin 81 MG chewable tablet Chew 1 tablet (81 mg total) by mouth daily.      Marland Kitchen BESIVANCE 0.6 % SUSP Place 1 drop into both eyes 2 (two) times  daily.       Marland Kitchen diltiazem (CARTIA XT) 180 MG 24 hr capsule Take 1 capsule (180 mg total) by mouth daily.  90 capsule  3  . fish oil-omega-3 fatty acids 1000 MG capsule Take 1 g by mouth 2 (two) times daily.        . hydrochlorothiazide (HYDRODIURIL) 25 MG tablet Take 12.5 mg by mouth daily. Take 1/2 tablet      . HYDROcodone-acetaminophen (NORCO/VICODIN) 5-325 MG per tablet Take 0.5-1 tablets by mouth 2 (two) times daily as needed.  90 tablet  0  . levothyroxine (SYNTHROID, LEVOTHROID) 25 MCG tablet Take 25 mcg by mouth every morning.      Marland Kitchen losartan (COZAAR) 50 MG tablet Take 50 mg by mouth daily.      . Multiple Vitamins-Minerals (OCUVITE PRESERVISION) TABS Take 1 tablet by mouth daily.        Marland Kitchen omeprazole (PRILOSEC OTC) 20 MG tablet Take 40 mg by mouth daily.      Marland Kitchen zolpidem (AMBIEN) 10 MG tablet Take 5 mg by mouth at bedtime as needed for sleep (only takes half tablet as needed).        No current facility-administered medications for this visit.    Allergies  Allergen Reactions  . Lipitor [Atorvastatin] Other (See Comments)    LFT elevation, nausea  .  Morphine And Related Other (See Comments)    "drives me crazy" and hyperactivity  . Irbesartan Swelling  . Ramipril Swelling    REACTION: lips swelling  . Telmisartan-Hctz Other (See Comments)    REACTION: incontinence  . Amiodarone        . Tikosyn [Dofetilide]     Long QT  . Norpace [Disopyramide] Other (See Comments)    Dry mouth    Past Medical History  Diagnosis Date  . Atrial fibrillation 04/23-24/2007    a. recurrent PAF with RVR in September 2013. b. Evaluated 03/2013, previously intolerant to Norpace and Amiodarone - consider Multaq if recurs. c. Not on anticoag due to history of falls and also some internal bleeding per son.  . Hypertrophic cardiomyopathy   . Urinary incontinence   . Diverticulosis of colon (without mention of hemorrhage) 2003/ 08/2000    EGD/colonoscopy Barretts esophagus//H.H divertics 08/2000  .  Cervical mass     C2 lateral mass fracture  . Hypertension   . Hypercholesterolemia     219/497  . Hypothyroidism   . Multinodular goiter (nontoxic)   . Osteoporosis   . Blood transfusion   . Jaundice ~ 1935    "in grade school"  . Degenerative joint disease     back  . Depression     "husband died 02/07/2011"  . Anemia, iron deficiency   . Personal history of colonic polyps 02/29/2012    tubular adenoma  . Barrett's esophagus   . CAD (coronary artery disease)     a. NSTEMI 03/2013: secondary to diagonal disease (small, not amenable to PCI, for med rx).  . Moderate mitral regurgitation 2014  . Mild aortic stenosis 2014  . Lung nodule seen on imaging study, pt does not wish further work up 02/08/2014  . Mitral valve regurgitation, mod to severe with moderately calcified annulus 02/08/2014  . Mitral valve mass, density - no fevers to suggest endocarditis 02/08/2014    Past Surgical History  Procedure Laterality Date  . Bladder surgery      bladder tack early 90's  . Tear duct probing  07/29/03    tear duct surg  . Cystourethroscopy  09/17/03  . Rotator cuff repair  ? date; 09/07/05    left; right( Dr. Gladstone [name])  . Appendectomy  1941  . Breast surgery  1981    breast reduction  . Eye surgery  03/2002    cataract OS  . Thyroid ultrasound  10/14/2003    MNG, no dominant masses  . Doppler echocardiography  03/05/2002&09/11/2003    ECHO, EF wnl, mild stenosis, A.S. mild MR, Mild T.R03/31/2003//ECHO EF 70%,LVH, ?diast dysfunction 09/11/2003  . Cataract extraction w/ intraocular lens  implant, bilateral  2003  . Dilation and curettage of uterus  09/07/2000    endometrial polyps removed, path all benign   . Tonsillectomy and adenoidectomy      "as a child"  . Fracture surgery  2010    right knee    History  Smoking status  . Former Smoker -- 0.50 packs/day for 4 years  . Types: Cigarettes  . Quit date: 07/06/1974  Smokeless tobacco  . Never Used    History  Alcohol Use No     Family History  Problem Relation Age of Onset  . Heart failure Mother     CHF, DM, HBP  . Hypertension Mother   . Uterine cancer Mother   . Stroke Mother   . Colon cancer Neg Hx   . Esophageal cancer  Neg Hx   . Rectal cancer Neg Hx   . Stomach cancer Neg Hx     Review of Systems: The review of systems is per the HPI.  All other systems were reviewed and are negative.  Physical Exam: BP 140/50  Pulse 111  Ht 5' (1.524 m)  Wt 131 lb (59.421 kg)  BMI 25.58 kg/m2  SpO2 96% Patient is an elderly female who looks chronically ill but in no acute distress. Skin is warm and dry. Color is normal.  HEENT is unremarkable. Normocephalic/atraumatic. PERRL. Sclera are nonicteric. Neck is supple. No masses. No JVD. Lungs are clear. Cardiac exam shows a regular rate and rhythm. Abdomen is soft. Extremities are without edema. Gait and ROM are intact. No gross neurologic deficits noted.  Wt Readings from Last 3 Encounters:  02/19/14 131 lb (59.421 kg)  02/08/14 126 lb 5.2 oz (57.3 kg)  01/30/14 131 lb (59.421 kg)     LABORATORY DATA: BMET is pending  Lab Results  Component Value Date   WBC 9.5 02/04/2014   HGB 11.0* 02/04/2014   HCT 35.3* 02/04/2014   PLT 217 02/04/2014   GLUCOSE 104* 02/08/2014   CHOL 160 08/30/2012   TRIG 77 08/30/2012   HDL 62 08/30/2012   LDLDIRECT 132.8 08/16/2007   LDLCALC 83 08/30/2012   ALT 10 08/13/2013   AST 16 08/13/2013   NA 130* 02/08/2014   K 4.2 02/08/2014   CL 92* 02/08/2014   CREATININE 0.72 02/08/2014   BUN 16 02/08/2014   CO2 24 02/08/2014   TSH 0.737 02/05/2014   INR 0.92 08/13/2013   MICROALBUR 0.4 02/11/2010   Echo Study Conclusions from March 2015  - Left ventricle: The cavity size was normal. There was moderate concentric hypertrophy. Systolic function was normal. The estimated ejection fraction was in the range of 60% to 65%. Wall motion was normal; there were no regional wall motion abnormalities. Features are consistent with a pseudonormal left ventricular  filling pattern, with concomitant abnormal relaxation and increased filling pressure (grade 2 diastolic dysfunction). - Aortic valve: Severe thickening and calcification. There was mild stenosis. Trivial regurgitation. Valve area: 1.18cm^2(VTI). Valve area: 0.96cm^2 (Vmax). - Mitral valve: There appears to be a mobile density off of the anterior MV leaflet that is only appreciated in the apical views and may represent a vegetation. Recommend TEE for further evaluation as well as determine the etiology of significant MR Moderately calcified annulus. Moderate to severe regurgitation directed eccentrically and toward the septum. Valve area by continuity equation (using LVOT flow): 1.53cm^2. - Left atrium: The atrium was mildly dilated. - Right ventricle: The cavity size was mildly dilated. - Tricuspid valve: Moderate regurgitation. - Pulmonary arteries: PA peak pressure: 54mm Hg (S).  Dg Chest Portable 1 View  02/04/2014    IMPRESSION: Congestive heart failure.  Nodular opacity right mid lung. This is a new finding compared to prior study. This finding warrants noncontrast enhanced chest CT to further evaluate; a small neoplasm cannot be excluded in this area.  Sizable high type hernia.   Electronically Signed   By: Lowella Grip M.D.   On: 02/04/2014 11:33   Assessment / Plan: 1. Progressive valvular heart disease - to manage conservatively.   2. Abnormal CXR - this was a 1 view film - she is not interested in further evaluation  3. CAD - managed medically.  4. PAF - now probably chronic - rate fair. Has been quite intolerant to lots of medicines. Will keep her on  her current regimen. Not a candidate for anticoagulation.  5. Advanced age  Check BMET today. See Dr. Martinique in 3 months. Son knows that we are available if needed in the interim.   Patient is agreeable to this plan and will call if any problems develop in the interim.   Burtis Junes, RN, [place] [address] [place], [place]  [address] [phone]

## 2014-02-19 NOTE — Patient Instructions (Addendum)
We will check lab today  See Dr. Martinique in 3 months  Stay on your medicines  Call the [place] office at [phone] if you have any questions, problems or concerns.

## 2014-02-22 ENCOUNTER — Telehealth: Payer: Self-pay | Admitting: *Deleted

## 2014-02-22 MED ORDER — FUROSEMIDE 20 MG PO TABS: 20.0000 mg | ORAL_TABLET | Freq: Every day | ORAL | Status: DC | PRN

## 2014-02-22 NOTE — Telephone Encounter (Signed)
[name], [name] Nurse says Cindy [name] has gained 3 lbs since yesterday.  Are there any instructions as to how to manage this.  Please advise.

## 2014-02-22 NOTE — Telephone Encounter (Signed)
I would take lasix 20mg  once today.  If weight isn't down by 1 pound tomorrow, then repeat a dose tomorrow. Would get f/u for labs and eval either here or at [facility] clinic on Monday.  Thanks.  rx sent.  Thanks.

## 2014-02-22 NOTE — Telephone Encounter (Signed)
Joy at [facility] and patient advised.  Lab and OV appt scheduled for Monday.

## 2014-02-25 ENCOUNTER — Other Ambulatory Visit: Payer: Medicare Other

## 2014-02-25 ENCOUNTER — Encounter: Payer: Self-pay | Admitting: Family Medicine

## 2014-02-25 ENCOUNTER — Ambulatory Visit (INDEPENDENT_AMBULATORY_CARE_PROVIDER_SITE_OTHER): Payer: Medicare Other | Admitting: Family Medicine

## 2014-02-25 VITALS — BP 142/50 | HR 68 | Temp 97.5°F | Wt 131.2 lb

## 2014-02-25 DIAGNOSIS — R0602 Shortness of breath: Secondary | ICD-10-CM

## 2014-02-25 DIAGNOSIS — I509 Heart failure, unspecified: Secondary | ICD-10-CM

## 2014-02-25 DIAGNOSIS — I5033 Acute on chronic diastolic (congestive) heart failure: Secondary | ICD-10-CM

## 2014-02-25 DIAGNOSIS — I2510 Atherosclerotic heart disease of native coronary artery without angina pectoris: Secondary | ICD-10-CM

## 2014-02-25 LAB — BRAIN NATRIURETIC PEPTIDE: Pro B Natriuretic peptide (BNP): 464 pg/mL — ABNORMAL HIGH (ref 0.0–100.0)

## 2014-02-25 LAB — BASIC METABOLIC PANEL
BUN: 16 mg/dL (ref 6–23)
CALCIUM: 8.8 mg/dL (ref 8.4–10.5)
CHLORIDE: 98 meq/L (ref 96–112)
CO2: 31 mEq/L (ref 19–32)
Creatinine, Ser: 0.8 mg/dL (ref 0.4–1.2)
GFR: 71.64 mL/min (ref >60.00)
GLUCOSE: 118 mg/dL — AB (ref 70–99)
Potassium: 3.7 mEq/L (ref 3.5–5.1)
Sodium: 135 mEq/L (ref 135–145)

## 2014-02-25 MED ORDER — FUROSEMIDE 20 MG PO TABS: 40.0000 mg | ORAL_TABLET | Freq: Every day | ORAL | Status: DC | PRN

## 2014-02-25 NOTE — Progress Notes (Signed)
Pre visit review using our clinic review tool, if applicable. No additional management support is needed unless otherwise documented below in the visit note.  She had low Na and HCTZ was cut by 50%.  She had weight gain, I called in lasix 20mg .  That dose didn't greatly increase her UOP overall.  Her weight is unchanged. She is off HCTZ in the meantime.  Still SOB, but not more than last week.  No CP.  No BLE edema.  Sleeping on 1 pillow. Not SOB laying down. She isn't SOB like she was with prev admission.  She felt like watching [employer] play basketball over the weekend.  She took lasix 20mg  this AM.    Meds, vitals, and allergies reviewed.   ROS: See HPI.  Otherwise, noncontributory.  nad ncat Mmm IRR with SEM that radiates to the carotids B ctab w/o focal dec in BS Mildly SOB with walking but not SOB in the exam chair.  Speaking in complete sentences Ext w/o edema abd soft

## 2014-02-25 NOTE — Patient Instructions (Addendum)
Take an extra 40mg  of lasix today (2 extra pills today).  Start taking 40mg  a day for now and update me tomorrow.  Take care.  We'll be in touch.  If you get more short of breath, then go to the ER.

## 2014-02-26 ENCOUNTER — Telehealth: Payer: Self-pay

## 2014-02-26 ENCOUNTER — Other Ambulatory Visit: Payer: Self-pay | Admitting: *Deleted

## 2014-02-26 MED ORDER — FUROSEMIDE 20 MG PO TABS: ORAL_TABLET | ORAL | Status: DC

## 2014-02-26 NOTE — Telephone Encounter (Signed)
Sent with sig for 1 a day, take an extra if weight is increased.

## 2014-02-26 NOTE — Telephone Encounter (Signed)
Patient says that if she is to continue on the Lasix, she would like the Rx sent to her mail order pharmacy and she would need it to be sent right away before she runs out of the initial supply.

## 2014-02-26 NOTE — Telephone Encounter (Signed)
Patient notified as instructed by telephone. Pt voiced understanding.  

## 2014-02-26 NOTE — Telephone Encounter (Signed)
Since she is down 2 lbs, I would not take any extra lasix today.  That should have helped some with the SOB. I would continue with 20mg  lasix a day for now and her update [place] tomorrow.   If she is more SOB or having CP, to ER.  Thanks.

## 2014-02-26 NOTE — Telephone Encounter (Signed)
Pt said had terrible night; at 4 AM woke up nausea, lower abdominal pain, H/A and dizzy. Pt had small BM and drank gingerale which helped nausea. Pt took 1/2 sleeping pill and slept until 8 AM. Pt is still weak and trembling and H/A. Pain level now 7. Pt urinated a lot last night and this morning pt had lost 2 lbs. No more SOB than usual. No swelling in lower legs. pts caretaker, [place] was with pt this morning but pt is alone now but [place] will be back around 3:30 pm. Pt said home health nurse is to come today between 4-5 pm. Pt said she is OK by herself for now. Pt took Lasix 40 mg on 02/25/14; pt took Lasix 20 mg this morning and pt wants to know what time she should take 2nd Lasix since she still feels trembly and H/A. Pt request cb.

## 2014-02-26 NOTE — Assessment & Plan Note (Signed)
Nontoxic, d/w pt about options.  She doesn't appear to need inpatient tx at this point.  Take an extra 40mg  of lasix today, take 40mg  tomorrow and report back with sx.  She agrees.  Would avoid HCTZ for now with the low Na prev.  She'll update me in about 1 day.

## 2014-02-27 NOTE — Telephone Encounter (Signed)
Patient notified as instructed by telephone. 

## 2014-03-07 ENCOUNTER — Encounter: Payer: Self-pay | Admitting: Family Medicine

## 2014-03-07 ENCOUNTER — Ambulatory Visit (INDEPENDENT_AMBULATORY_CARE_PROVIDER_SITE_OTHER): Payer: Medicare Other | Admitting: Family Medicine

## 2014-03-07 VITALS — BP 140/60 | HR 116 | Temp 97.4°F | Wt 128.8 lb

## 2014-03-07 DIAGNOSIS — R0602 Shortness of breath: Secondary | ICD-10-CM

## 2014-03-07 DIAGNOSIS — I2510 Atherosclerotic heart disease of native coronary artery without angina pectoris: Secondary | ICD-10-CM

## 2014-03-07 DIAGNOSIS — I4891 Unspecified atrial fibrillation: Secondary | ICD-10-CM

## 2014-03-07 MED ORDER — METOPROLOL TARTRATE 25 MG PO TABS: 12.5000 mg | ORAL_TABLET | Freq: Two times a day (BID) | ORAL | Status: DC

## 2014-03-07 MED ORDER — HYDROCODONE-ACETAMINOPHEN 5-325 MG PO TABS: 0.5000 | ORAL_TABLET | Freq: Two times a day (BID) | ORAL | Status: DC | PRN

## 2014-03-07 MED ORDER — ZOLPIDEM TARTRATE 10 MG PO TABS: 5.0000 mg | ORAL_TABLET | Freq: Every evening | ORAL | Status: DC | PRN

## 2014-03-07 NOTE — Assessment & Plan Note (Signed)
Vs flutter.  EKG reviewed and compared to prev.  D/w pt.  She wants to avoid hospitalization.  Doesn't appear toxic.  If worsening, to ER.  CP free, now and prev.  Likely euvolemic now.  Continue lasix as is.  See notes on labs.  Likely with current sx related to tachycardia, would add on low dose BB and will ask for cards f/u next week.  I didn't want to alter her CCB dose.  She agrees.  Appears okay for outpatient f/u.  She and son agree with plan.   >25 minutes spent in face to face time with patient, >50% spent in counselling or coordination of care.

## 2014-03-07 NOTE — Patient Instructions (Signed)
Go to the lab on the way out.  We'll contact you with your lab report.  Take your regular meds and add on 1/2 tab of metoprolol twice a day.  I'll ask about you seeing the cardiology clinic next week.  If you get more short of breath or have chest pain, then go to the ER.

## 2014-03-07 NOTE — Progress Notes (Signed)
Pre visit review using our clinic review tool, if applicable. No additional management support is needed unless otherwise documented below in the visit note.  We had adjusted her lasix at the last OV.  She got some fluid and weight off with higher lasix dose.  Her weight is down now.  This AM she felt dizzy and nauseated.  No CP.  She called EMS this AM, was evaluated at home.  She declined ER eval today.  She got some better as the day went on.  She is slightly "dizzy" now, meaning pressure on her forehead but no room spinning, not lightheaded.  She is a little more SOB than normal.  No BLE edema. Still no CP.   She had a death in the family, I offered my condolences.    Meds, vitals, and allergies reviewed.   ROS: See HPI.  Otherwise, noncontributory.  nad ncat Mmm Neck supple, no LA IRR, mildly tachy, murmur noted Ctab, no inc in wob abd soft Ext w/o edema  EKG reviewed and compared to prev.

## 2014-03-08 LAB — CBC WITH DIFFERENTIAL/PLATELET
BASOS PCT: 0 % (ref 0–1)
Basophils Absolute: 0 10*3/uL (ref 0.0–0.1)
EOS ABS: 0.1 10*3/uL (ref 0.0–0.7)
Eosinophils Relative: 1 % (ref 0–5)
HEMATOCRIT: 35.5 % — AB (ref 36.0–46.0)
HEMOGLOBIN: 11 g/dL — AB (ref 12.0–15.0)
Lymphocytes Relative: 23 % (ref 12–46)
Lymphs Abs: 1.6 10*3/uL (ref 0.7–4.0)
MCH: 23.9 pg — AB (ref 26.0–34.0)
MCHC: 31 g/dL (ref 30.0–36.0)
MCV: 77 fL — ABNORMAL LOW (ref 78.0–100.0)
MONO ABS: 0.9 10*3/uL (ref 0.1–1.0)
MONOS PCT: 13 % — AB (ref 3–12)
Neutro Abs: 4.3 10*3/uL (ref 1.7–7.7)
Neutrophils Relative %: 63 % (ref 43–77)
Platelets: 286 10*3/uL (ref 150–400)
RBC: 4.61 MIL/uL (ref 3.87–5.11)
RDW: 15.8 % — ABNORMAL HIGH (ref 11.5–15.5)
WBC: 6.8 10*3/uL (ref 4.0–10.5)

## 2014-03-08 LAB — BASIC METABOLIC PANEL
BUN: 11 mg/dL (ref 6–23)
CO2: 28 meq/L (ref 19–32)
Calcium: 8.9 mg/dL (ref 8.4–10.5)
Chloride: 99 mEq/L (ref 96–112)
Creat: 0.75 mg/dL (ref 0.50–1.10)
Glucose, Bld: 77 mg/dL (ref 70–99)
Potassium: 3.8 mEq/L (ref 3.5–5.3)
SODIUM: 139 meq/L (ref 135–145)

## 2014-03-08 LAB — BRAIN NATRIURETIC PEPTIDE: Brain Natriuretic Peptide: 481.7 pg/mL — ABNORMAL HIGH (ref 0.0–100.0)

## 2014-03-13 ENCOUNTER — Telehealth: Payer: Self-pay

## 2014-03-13 NOTE — Telephone Encounter (Signed)
Patient notified as instructed by telephone. 

## 2014-03-13 NOTE — Telephone Encounter (Signed)
Cindy [name] from Dr Doug Sou office said got message from Dr Damita Dunnings; pt saw [name] on 02/19/14; Cindy [name] ask Dr Damita Dunnings to review 02/19/14 note; pt has known atrial fib. Cindy [name] wants to know if pt needs to be seen or not and if so why. Cheryl request cb.

## 2014-03-13 NOTE — Telephone Encounter (Signed)
I did review their note at the time of the OV.  We had to start a beta blocker due to tachycardia, she was more SOB, and I wanted cards input.  Have them read my last note and then call her about getting her seen.  Thanks.

## 2014-03-13 NOTE — Telephone Encounter (Signed)
Then I'd stay off the metoprolol and f/u with cards. Thanks.

## 2014-03-13 NOTE — Telephone Encounter (Signed)
Patient called appointment scheduled with Truitt Merle NP 03/19/14 at 2:00 pm.Patient stated she cannot take new medication Dr.Duncan gave her causes headaches and dizziness.

## 2014-03-13 NOTE — Addendum Note (Signed)
Addended by: Tonia Ghent on: 03/13/2014 04:56 PM   Modules accepted: Orders, Medications

## 2014-03-13 NOTE — Telephone Encounter (Signed)
Cheryl please see note from Dr. Damita Dunnings.

## 2014-03-13 NOTE — Telephone Encounter (Signed)
Spoke to [employer] office at [facility], Truitt Merle NP received a message from [place] requesting patient to be seen this week for atrial fib.Patient already had a visit with Cecille Rubin 02/19/14 she has known atrial fib not sure why patient needs to be seen.[facility] office will check with Dr.Duncan today to find out if patient needs to be seen and will call me back.

## 2014-03-18 ENCOUNTER — Encounter: Payer: Self-pay | Admitting: Cardiology

## 2014-03-18 ENCOUNTER — Telehealth: Payer: Self-pay

## 2014-03-18 NOTE — Telephone Encounter (Signed)
JoAnn nurse with [facility] left v/m requesting to continue skilled nursing home health 1 x a week for 3 more weeks; starting next week. Cindy [name] wants to continue education for medications and disease mgt (CHF). Cindy [name] wants to continue seeing due to last visit pt had gotten Lasix med mixed up. Please advise.

## 2014-03-18 NOTE — Telephone Encounter (Signed)
Left detailed message on JoAnn's (nurse from [facility]) voicemail.

## 2014-03-18 NOTE — Telephone Encounter (Signed)
Please give the order.  Thanks.   

## 2014-03-19 ENCOUNTER — Encounter: Payer: Self-pay | Admitting: Nurse Practitioner

## 2014-03-19 ENCOUNTER — Ambulatory Visit (INDEPENDENT_AMBULATORY_CARE_PROVIDER_SITE_OTHER): Payer: Medicare Other | Admitting: Nurse Practitioner

## 2014-03-19 VITALS — BP 160/60 | HR 72 | Ht 60.0 in | Wt 129.8 lb

## 2014-03-19 DIAGNOSIS — I5031 Acute diastolic (congestive) heart failure: Secondary | ICD-10-CM

## 2014-03-19 DIAGNOSIS — I359 Nonrheumatic aortic valve disorder, unspecified: Secondary | ICD-10-CM

## 2014-03-19 DIAGNOSIS — I480 Paroxysmal atrial fibrillation: Secondary | ICD-10-CM

## 2014-03-19 DIAGNOSIS — I350 Nonrheumatic aortic (valve) stenosis: Secondary | ICD-10-CM

## 2014-03-19 DIAGNOSIS — I2510 Atherosclerotic heart disease of native coronary artery without angina pectoris: Secondary | ICD-10-CM

## 2014-03-19 DIAGNOSIS — I509 Heart failure, unspecified: Secondary | ICD-10-CM

## 2014-03-19 DIAGNOSIS — I4891 Unspecified atrial fibrillation: Secondary | ICD-10-CM

## 2014-03-19 NOTE — Progress Notes (Signed)
Cindy [name] Date of Birth: [birth year] Medical Record #[id number]  History of Present Illness: [name] is seen back today for a work in visit. Seen for Dr. Martinique. Formerly seen by Dr. Stanford Breed. She is a soon to be 80 year old female with HOCM, CAD with past NSTEMI in 03/2013 in the setting of rapid AF - has high grade stenosis in the first diagonal branch (moderate vessel in length but small in caliber) and a 70% stenosis in the 2nd DX - she was managed medically, PAF, diastolic HF, valvular heart disease and advanced age. EF is normal by echo from April of 2014 but with grade II diastolic dysfunction, mild AS, mild MR and mild MS. She is intolerant to amiodarone and not a candidate for Tikosyn due to prolonged QT - not a candidate for anticoagulation given past history of bleeding and falls.   Seen by Dr. Martinique in February of this year - felt to be stable.   Admitted back in March with an acute onset of shortness of breath and hypoxia. Treated with oxygen, CPAP and diuresis. BP quite high and she was given IV NTG. Mild elevation in troponin. CXR with lung nodule - family did not wish to work up further. Echo updated - moderate to severe MR with a mobile density on the mitral valve - no fevers and not felt to be endocarditis - she did not wish to pursue cardiac surgery.   Seen back after being home for 2 weeks. Seemed to be holding her own. Weight was fairly stable.  Resistant to moving in with her family. She was taking 1/2 the HCTZ twice a day - she wished to continue.   Seen back by her PCP - noted to be more short of breath and was tachycardic. She has had her HCTZ stopped due to hyponatremia and placed on lasix. He started low dose beta blocker. She ended up not tolerating this - was dizzy and "horribly sick".   Comes back today. Here alone today. She says she is doing ok. She feels ok for the most part. She gives out easily. Mostly limited by her arthritic pain. No real palpitations. No  swelling. Says her breathing is ok.  Not interested in taking any extra medicine or changing anything at this time.    Current Outpatient Prescriptions  Medication Sig Dispense Refill  . aspirin 81 MG chewable tablet Chew 1 tablet (81 mg total) by mouth daily.      Marland Kitchen BESIVANCE 0.6 % SUSP Place 1 drop into both eyes 2 (two) times daily.       Marland Kitchen diltiazem (CARTIA XT) 180 MG 24 hr capsule Take 1 capsule (180 mg total) by mouth daily.  90 capsule  3  . fish oil-omega-3 fatty acids 1000 MG capsule Take 1 g by mouth 2 (two) times daily.        . furosemide (LASIX) 20 MG tablet Take 1 tab a day.  Take and extra tab daily as needed for fluid (if weight gain >3lbs).  180 tablet  3  . HYDROcodone-acetaminophen (NORCO/VICODIN) 5-325 MG per tablet Take 0.5-1 tablets by mouth 2 (two) times daily as needed.  90 tablet  0  . levothyroxine (SYNTHROID, LEVOTHROID) 25 MCG tablet Take 25 mcg by mouth every morning.      Marland Kitchen losartan (COZAAR) 50 MG tablet Take 50 mg by mouth daily.      . Multiple Vitamins-Minerals (OCUVITE PRESERVISION) TABS Take 1 tablet by mouth daily.        Marland Kitchen  omeprazole (PRILOSEC) 40 MG capsule Take 40 mg by mouth daily.      Marland Kitchen zolpidem (AMBIEN) 10 MG tablet Take 0.5 tablets (5 mg total) by mouth at bedtime as needed for sleep (only takes half tablet as needed).  30 tablet  1   No current facility-administered medications for this visit.    Allergies  Allergen Reactions  . Lipitor [Atorvastatin] Other (See Comments)    LFT elevation, nausea  . Morphine And Related Other (See Comments)    "drives me crazy" and hyperactivity  . Irbesartan Swelling  . Ramipril Swelling    REACTION: lips swelling  . Telmisartan-Hctz Other (See Comments)    REACTION: incontinence  . Amiodarone        . Hydrochlorothiazide     hyponatremia  . Metoprolol     Headache, dizzy  . Tikosyn [Dofetilide]     Long QT  . Norpace [Disopyramide] Other (See Comments)    Dry mouth    Past Medical History    Diagnosis Date  . Atrial fibrillation 04/23-24/2007    a. recurrent PAF with RVR in September 2013. b. Evaluated 03/2013, previously intolerant to Norpace and Amiodarone - consider Multaq if recurs. c. Not on anticoag due to history of falls and also some internal bleeding per son.  . Hypertrophic cardiomyopathy   . Urinary incontinence   . Diverticulosis of colon (without mention of hemorrhage) 2003/ 08/2000    EGD/colonoscopy Barretts esophagus//H.H divertics 08/2000  . Cervical mass     C2 lateral mass fracture  . Hypertension   . Hypercholesterolemia     219/497  . Hypothyroidism   . Multinodular goiter (nontoxic)   . Osteoporosis   . Blood transfusion   . Jaundice ~ 1935    "in grade school"  . Degenerative joint disease     back  . Depression     "husband died 2011/02/22"  . Anemia, iron deficiency   . Personal history of colonic polyps 02/29/2012    tubular adenoma  . Barrett's esophagus   . CAD (coronary artery disease)     a. NSTEMI 03/2013: secondary to diagonal disease (small, not amenable to PCI, for med rx).  . Moderate mitral regurgitation 2014  . Mild aortic stenosis 2014  . Lung nodule seen on imaging study, pt does not wish further work up 02/08/2014  . Mitral valve regurgitation, mod to severe with moderately calcified annulus 02/08/2014  . Mitral valve mass, density - no fevers to suggest endocarditis 02/08/2014    Past Surgical History  Procedure Laterality Date  . Bladder surgery      bladder tack early 90's  . Tear duct probing  07/29/03    tear duct surg  . Cystourethroscopy  09/17/03  . Rotator cuff repair  ? date; 09/07/05    left; right( Dr. Gladstone Lighter)  . Appendectomy  1941  . Breast surgery  1981    breast reduction  . Eye surgery  03/2002    cataract OS  . Thyroid ultrasound  10/14/2003    MNG, no dominant masses  . Doppler echocardiography  03/05/2002&09/11/2003    ECHO, EF wnl, mild stenosis, A.S. mild MR, Mild T.R03/31/2003//ECHO EF 70%,LVH,  ?diast dysfunction 09/11/2003  . Cataract extraction w/ intraocular lens  implant, bilateral  2003  . Dilation and curettage of uterus  09/07/2000    endometrial polyps removed, path all benign   . Tonsillectomy and adenoidectomy      "as a child"  . Fracture surgery  2010  right knee    History  Smoking status  . Former Smoker -- 0.50 packs/day for 4 years  . Types: Cigarettes  . Quit date: 07/06/1974  Smokeless tobacco  . Never Used    History  Alcohol Use No    Family History  Problem Relation Age of Onset  . Heart failure Mother     CHF, DM, HBP  . Hypertension Mother   . Uterine cancer Mother   . Stroke Mother   . Colon cancer Neg Hx   . Esophageal cancer Neg Hx   . Rectal cancer Neg Hx   . Stomach cancer Neg Hx     Review of Systems: The review of systems is per the HPI.  All other systems were reviewed and are negative.  Physical Exam: BP 160/60  Pulse 72  Ht 5' (1.524 m)  Wt 129 lb 12.8 oz (58.877 kg)  BMI 25.35 kg/m2 Repeat BP by me is 154/70.  Patient is very pleasant and in no acute distress. She actually looks better today. Skin is warm and dry. Color is normal.  HEENT is unremarkable. Normocephalic/atraumatic. PERRL. Sclera are nonicteric. Neck is supple. No masses. No JVD. Lungs are clear. Cardiac exam shows a regular rate and rhythm. Harsh outflow murmur noted. Abdomen is soft. Extremities are without edema. She is in a wheelchair. No gross neurologic deficits noted.  Wt Readings from Last 3 Encounters:  03/19/14 129 lb 12.8 oz (58.877 kg)  03/07/14 128 lb 12 oz (58.401 kg)  02/25/14 131 lb 4 oz (59.535 kg)     LABORATORY DATA: EKG today shows sinus with PAC.   Lab Results  Component Value Date   WBC 6.8 03/07/2014   HGB 11.0* 03/07/2014   HCT 35.5* 03/07/2014   PLT 286 03/07/2014   GLUCOSE 77 03/07/2014   CHOL 160 08/30/2012   TRIG 77 08/30/2012   HDL 62 08/30/2012   LDLDIRECT 132.8 08/16/2007   LDLCALC 83 08/30/2012   ALT 10 08/13/2013   AST 16  08/13/2013   NA 139 03/07/2014   K 3.8 03/07/2014   CL 99 03/07/2014   CREATININE 0.75 03/07/2014   BUN 11 03/07/2014   CO2 28 03/07/2014   TSH 0.737 02/05/2014   INR 0.92 08/13/2013   MICROALBUR 0.4 02/11/2010   Echo Study Conclusions from March 2015  - Left ventricle: The cavity size was normal. There was moderate concentric hypertrophy. Systolic function was normal. The estimated ejection fraction was in the range of 60% to 65%. Wall motion was normal; there were no regional wall motion abnormalities. Features are consistent with a pseudonormal left ventricular filling pattern, with concomitant abnormal relaxation and increased filling pressure (grade 2 diastolic dysfunction). - Aortic valve: Severe thickening and calcification. There was mild stenosis. Trivial regurgitation. Valve area: 1.18cm^2(VTI). Valve area: 0.96cm^2 (Vmax). - Mitral valve: There appears to be a mobile density off of the anterior MV leaflet that is only appreciated in the apical views and may represent a vegetation. Recommend TEE for further evaluation as well as determine the etiology of significant MR Moderately calcified annulus. Moderate to severe regurgitation directed eccentrically and toward the septum. Valve area by continuity equation (using LVOT flow): 1.53cm^2. - Left atrium: The atrium was mildly dilated. - Right ventricle: The cavity size was mildly dilated. - Tricuspid valve: Moderate regurgitation. - Pulmonary arteries: PA peak pressure: 55mm Hg (S).  Dg Chest Portable 1 View  02/04/2014 IMPRESSION: Congestive heart failure. Nodular opacity right mid lung. This is a new  finding compared to prior study. This finding warrants noncontrast enhanced chest CT to further evaluate; a small neoplasm cannot be excluded in this area. Sizable high type hernia. Electronically Signed By: Lowella Grip M.D. On: 02/04/2014 11:33    Assessment / Plan:  1. Progressive valvular heart disease - to manage conservatively.     2. Abnormal CXR - this was a 1 view film - she was not interested in further evaluation   3. CAD - managed medically. No chest pain reported.   4. PAF - Has been quite intolerant to lots of medicines, now to include metoprolol. Will keep her on her current regimen. Not a candidate for anticoagulation. Could consider increasing her CCB if needed for rate control but she wishes to leave her medicines as they are for now.   5. Advanced age - turning 89 here in 3 weeks.   I offered to see her back in a month - she wants to hold off until her visit in June. Will be available as needed.   Patient is agreeable to this plan and will call if any problems develop in the interim.   Burtis Junes, RN, [facility]  [address] [place]  [place], [place] [address]  [phone]

## 2014-03-19 NOTE — Patient Instructions (Signed)
Stay on your current medicines  See Dr. Martinique back as planned  Call the [place] office at [phone] if you have any questions, problems or concerns.

## 2014-03-29 ENCOUNTER — Encounter (INDEPENDENT_AMBULATORY_CARE_PROVIDER_SITE_OTHER): Payer: PRIVATE HEALTH INSURANCE | Admitting: Ophthalmology

## 2014-04-03 ENCOUNTER — Telehealth: Payer: Self-pay | Admitting: *Deleted

## 2014-04-03 MED ORDER — FUROSEMIDE 20 MG PO TABS: ORAL_TABLET | ORAL | Status: DC

## 2014-04-03 NOTE — Telephone Encounter (Signed)
Continue as is.  Notify us if weight is increased.  Med list updated.  Thanks.

## 2014-04-03 NOTE — Telephone Encounter (Signed)
Joy called to let you know that patient is not taking a full Lasix 20 mg, but is taking 1/2. Joy stated that patient feels washed out when she takes a whole one and does much better with 1/2. Joy stated that she is planning on discharging patient from home health next week because she is maintaining her weight. Please let her know if there is any thing else that she needs to do.

## 2014-04-04 NOTE — Telephone Encounter (Signed)
Joy advised. 

## 2014-04-05 ENCOUNTER — Encounter (INDEPENDENT_AMBULATORY_CARE_PROVIDER_SITE_OTHER): Payer: Medicare Other | Admitting: Ophthalmology

## 2014-04-05 DIAGNOSIS — I10 Essential (primary) hypertension: Secondary | ICD-10-CM

## 2014-04-05 DIAGNOSIS — H3530 Unspecified macular degeneration: Secondary | ICD-10-CM

## 2014-04-05 DIAGNOSIS — H35039 Hypertensive retinopathy, unspecified eye: Secondary | ICD-10-CM

## 2014-04-05 DIAGNOSIS — H353290 Exudative age-related macular degeneration, unspecified eye, stage unspecified: Secondary | ICD-10-CM

## 2014-04-05 DIAGNOSIS — H43819 Vitreous degeneration, unspecified eye: Secondary | ICD-10-CM

## 2014-04-16 ENCOUNTER — Encounter: Payer: Self-pay | Admitting: Family Medicine

## 2014-04-16 ENCOUNTER — Ambulatory Visit (INDEPENDENT_AMBULATORY_CARE_PROVIDER_SITE_OTHER): Payer: Medicare Other | Admitting: Family Medicine

## 2014-04-16 VITALS — BP 140/60 | HR 95 | Temp 98.5°F | Wt 129.0 lb

## 2014-04-16 DIAGNOSIS — I2510 Atherosclerotic heart disease of native coronary artery without angina pectoris: Secondary | ICD-10-CM

## 2014-04-16 DIAGNOSIS — G4700 Insomnia, unspecified: Secondary | ICD-10-CM

## 2014-04-16 DIAGNOSIS — I10 Essential (primary) hypertension: Secondary | ICD-10-CM

## 2014-04-16 MED ORDER — ZOLPIDEM TARTRATE 10 MG PO TABS: 5.0000 mg | ORAL_TABLET | Freq: Every evening | ORAL | Status: DC | PRN

## 2014-04-16 NOTE — Patient Instructions (Signed)
Drink a little more water in the meantime, enough to keep your urine light or clear.   Don't change your meds for now and let me know if that doesn't help.  Take care.

## 2014-04-16 NOTE — Progress Notes (Signed)
Pre visit review using our clinic review tool, if applicable. No additional management support is needed unless otherwise documented below in the visit note.  She had felt well until a few days ago.  "Weak and swimmy headed."  She'll have a HA after taking her BP medicine for about 15 minutes, then it will go away.  She is on her baseline meds.  No palpitations.  She doesn't recall episodes of AF recently.  She feels better now/today than prev.    She doesn't describe room spinning.  She describes a pressure in her forehead B, better now than prev.  No presyncope.  No syncope.  No CP.  Now BLE edema.  SOB at baseline, not worse than typical.  Sleeping on 1 pillow.  Not SOB laying down.    BP has been 140-150s/50s usually.  Weight is steady, 128-130 lbs. She did have lower BPs this AM, ~100/50  Prev cards note from 03/2014 noted.    Ambien helped her sleep w/o ADE.  Needed a refill.  D/w pt.  Done today.   Meds, vitals, and allergies reviewed.   ROS: See HPI.  Otherwise, noncontributory.  nad ncat Mouth is a little dry, but not very dry Neck supple, no LA rrr ctab abd soft not ttp Ext w/o edema

## 2014-04-17 ENCOUNTER — Telehealth: Payer: Self-pay | Admitting: *Deleted

## 2014-04-17 MED ORDER — HYDROCODONE-ACETAMINOPHEN 5-325 MG PO TABS: 0.5000 | ORAL_TABLET | Freq: Two times a day (BID) | ORAL | Status: DC | PRN

## 2014-04-17 NOTE — Telephone Encounter (Signed)
I don't recall seeing that rx yesterday.  She had the [place] rx out of the bag and a saw that, but I didn't pull the others out.   I am okay filling it since she is due for refill anyway. Printed.

## 2014-04-17 NOTE — Assessment & Plan Note (Signed)
Unclear origin for her sx.  She doesn't drink a lot of water and did have lower BP this AM.  Mouth is slightly dry.  This doesn't sound to be vertiginous.  We talked about options.  "whenever my meds get changed, I do worse."  We elected to have her drink more fluid in the meantime since she is some better today.  She agrees.  She does have CP or palpitations.

## 2014-04-17 NOTE — Telephone Encounter (Signed)
Patient called stating that she was in the office yesterday and when she got home she could not her bottle of pain medication. Patient stated that she had it in a bag with her other medications and now can not find it. Patient stated that she has checked her purse, the car that she was in and every where she can think of. I checked with the front office, medical assistant that worked with Dr. Damita Dunnings yesterday and no one has seen the bottle of pain medication. Patient stated that she will take tylenol for her pain but wants to know when she can get a refill on her pain medication?

## 2014-04-17 NOTE — Telephone Encounter (Signed)
Patient advised by telephone that script is up front ready for pickup.

## 2014-04-17 NOTE — Assessment & Plan Note (Signed)
Continue prn ambien  

## 2014-04-25 ENCOUNTER — Other Ambulatory Visit: Payer: Self-pay | Admitting: Family Medicine

## 2014-04-25 ENCOUNTER — Telehealth: Payer: Self-pay | Admitting: Family Medicine

## 2014-04-25 ENCOUNTER — Ambulatory Visit (INDEPENDENT_AMBULATORY_CARE_PROVIDER_SITE_OTHER): Payer: Medicare Other | Admitting: Family Medicine

## 2014-04-25 ENCOUNTER — Encounter: Payer: Self-pay | Admitting: Family Medicine

## 2014-04-25 VITALS — BP 138/66 | HR 117 | Temp 98.0°F | Wt 128.5 lb

## 2014-04-25 DIAGNOSIS — I480 Paroxysmal atrial fibrillation: Secondary | ICD-10-CM

## 2014-04-25 DIAGNOSIS — I4891 Unspecified atrial fibrillation: Secondary | ICD-10-CM

## 2014-04-25 DIAGNOSIS — I2510 Atherosclerotic heart disease of native coronary artery without angina pectoris: Secondary | ICD-10-CM

## 2014-04-25 DIAGNOSIS — R350 Frequency of micturition: Secondary | ICD-10-CM

## 2014-04-25 DIAGNOSIS — G4700 Insomnia, unspecified: Secondary | ICD-10-CM

## 2014-04-25 LAB — POCT URINALYSIS DIPSTICK
BILIRUBIN UA: NEGATIVE
Glucose, UA: NEGATIVE
Ketones, UA: NEGATIVE
Nitrite, UA: NEGATIVE
Protein, UA: NEGATIVE
Spec Grav, UA: <=1.005
Urobilinogen, UA: NEGATIVE
pH, UA: 7.5

## 2014-04-25 MED ORDER — ZOLPIDEM TARTRATE 5 MG PO TABS: 5.0000 mg | ORAL_TABLET | Freq: Every evening | ORAL | Status: DC | PRN

## 2014-04-25 MED ORDER — DILTIAZEM HCL ER COATED BEADS 180 MG PO CP24: 180.0000 mg | ORAL_CAPSULE | Freq: Two times a day (BID) | ORAL | Status: DC

## 2014-04-25 NOTE — Telephone Encounter (Signed)
Yes, my understanding is that she is off it.

## 2014-04-25 NOTE — Assessment & Plan Note (Signed)
Change to 5mg  Cindy [name] so she doesn't have to cut her pills.

## 2014-04-25 NOTE — Patient Instructions (Signed)
We'll contact you with your lab report. Increase your diltiazem to 1 tab twice a day.  If you get more short of breath or have chest pain, then go to the ER.  Cardiology will call you about getting seen in the clinic.  Take care.

## 2014-04-25 NOTE — Assessment & Plan Note (Signed)
Intolerant of mult meds.  I talked with Dr. Rayann Heman via phone before she left the office.  Discussed options.  Would be reasonable to try to inc her CCB at least with a double dose tonight.  Pt didn't want to do that initially- she has a HA for about 15 min after taking the medicine.  D/w pt about taking a dose, waiting, then taking another dose tonight.   If AF continues in the 120s or higher, she'll likely feel worse/SOB--> to ER.   We'll have cards see her soon.  She doesn't appear to need hospitalization at this point, but she may decline.  D/w pt.  She doesn't want ER eval now.   We started talking about end of life concerns- she initiated that portion of the conversation.   She'll update [place] tomorrow.  >40 minutes spent in face to face time with patient, >50% spent in counselling or coordination of care, consulting other MD, etc.

## 2014-04-25 NOTE — Progress Notes (Signed)
Pre visit review using our clinic review tool, if applicable. No additional management support is needed unless otherwise documented below in the visit note.  I went through the history mult times to get information from patient.   She is urinating more frequently with diuretic use, but this is at baseline. She has no burning, no abd pain currently.    She hasn't been sleeping well.  Her Lorrin Mais was crumbling when she tried to cut it.  She doesn't have any left at this point and isn't sleeping.   HR is fast, episodically, noted here today.  She had not tolerated mult meds.  Weight and BP are stable. She is more SOB than normal.    Meds, vitals, and allergies reviewed.   ROS: See HPI.  Otherwise, noncontributory.  nad ncat IRR, tachy to ~120 ctab abd soft, not ttp Ext w/o edema.

## 2014-04-25 NOTE — Telephone Encounter (Signed)
Cindy [name] at [place] office called.  Patient has an appointment with [name] on 05/01/14 at 8:45.  I notified [place],patient's care giver, of appointment.

## 2014-04-25 NOTE — Telephone Encounter (Signed)
Was she taken off this medication?  I don't see it on her current meds list.

## 2014-04-25 NOTE — Assessment & Plan Note (Signed)
Unclear if she has a UTI.  No clear sx at this point. Would check UCX and treat if positive and suggestive sx.

## 2014-04-25 NOTE — Telephone Encounter (Signed)
Thanks

## 2014-04-26 ENCOUNTER — Telehealth: Payer: Self-pay

## 2014-04-26 NOTE — Telephone Encounter (Signed)
I'm glad her pulse is better and she feels some better.  I would continue as is for now and we'll notify her about the ucx when possible.   See note re: cards appointment on 05/01/14.   Thanks to all.

## 2014-04-26 NOTE — Telephone Encounter (Signed)
Patient advised.

## 2014-04-26 NOTE — Telephone Encounter (Signed)
Pt did not have problem taking the diltiazem; feet are not as swollen. Pt slept good last night but pt had leg cramps this morning. BP 128/52 P62. Stilling no burning or pain when urinates. Pt request cb when lab results are completed.

## 2014-04-27 LAB — URINE CULTURE
Colony Count: NO GROWTH
ORGANISM ID, BACTERIA: NO GROWTH

## 2014-04-30 ENCOUNTER — Telehealth: Payer: Self-pay | Admitting: *Deleted

## 2014-04-30 NOTE — Telephone Encounter (Signed)
Patient says she is having trouble taking as much medication as you prescribed.  She says she was instructed to take 2 tablets twice daily.  She says it is giving her severe headaches and she can't sleep.  She has cut back to 2 tablets in the morning and 1 tablet at night.  She sees Cardiology on Wednesday, May 27th and will take this up with them for their advice. Does she have her instructions confused?

## 2014-05-01 ENCOUNTER — Encounter: Payer: Self-pay | Admitting: Physician Assistant

## 2014-05-01 ENCOUNTER — Ambulatory Visit (INDEPENDENT_AMBULATORY_CARE_PROVIDER_SITE_OTHER): Payer: Medicare Other | Admitting: Physician Assistant

## 2014-05-01 VITALS — BP 130/46 | HR 62 | Ht 60.0 in | Wt 130.0 lb

## 2014-05-01 DIAGNOSIS — I2510 Atherosclerotic heart disease of native coronary artery without angina pectoris: Secondary | ICD-10-CM

## 2014-05-01 DIAGNOSIS — E785 Hyperlipidemia, unspecified: Secondary | ICD-10-CM

## 2014-05-01 DIAGNOSIS — R252 Cramp and spasm: Secondary | ICD-10-CM | POA: Insufficient documentation

## 2014-05-01 DIAGNOSIS — I10 Essential (primary) hypertension: Secondary | ICD-10-CM

## 2014-05-01 LAB — BASIC METABOLIC PANEL
BUN: 16 mg/dL (ref 6–23)
CALCIUM: 8.8 mg/dL (ref 8.4–10.5)
CO2: 24 mEq/L (ref 19–32)
Chloride: 101 mEq/L (ref 96–112)
Creatinine, Ser: 0.8 mg/dL (ref 0.4–1.2)
GFR: 69.6 mL/min (ref >60.00)
GLUCOSE: 119 mg/dL — AB (ref 70–99)
Potassium: 4.2 mEq/L (ref 3.5–5.1)
Sodium: 133 mEq/L — ABNORMAL LOW (ref 135–145)

## 2014-05-01 MED ORDER — DILTIAZEM HCL ER COATED BEADS 120 MG PO CP24: 120.0000 mg | ORAL_CAPSULE | Freq: Every day | ORAL | Status: DC

## 2014-05-01 MED ORDER — DILTIAZEM HCL ER COATED BEADS 180 MG PO CP24: 180.0000 mg | ORAL_CAPSULE | Freq: Every day | ORAL | Status: DC

## 2014-05-01 NOTE — Telephone Encounter (Signed)
She saw cards today and they appear to have addressed this.  Thanks.

## 2014-05-01 NOTE — Assessment & Plan Note (Signed)
Blood pressure currently controlled.

## 2014-05-01 NOTE — Assessment & Plan Note (Signed)
Patient has a few Rales on exam today but no increase shortness of breath or edema. Her weight has been stable. I am reluctant to increase her diuretics at this time. Will continue to monitor. Followup with Dr. Martinique June 15.

## 2014-05-01 NOTE — Telephone Encounter (Signed)
Spoke to patient by telephone and was advised that Cardiology did take care of everything today for her.

## 2014-05-01 NOTE — Assessment & Plan Note (Signed)
Patient had recent rapid atrial for but does not tolerate increased diltiazem dose. If she continues to have rapid atrial fib she will most likely end up in the hospital with heart failure. I had along discussion with her and her son. They're willing to try 180 mg in the morning and 120 mg in the evening. If this evening doses though makes her sick we can try 60 mg in the evening.

## 2014-05-01 NOTE — Patient Instructions (Addendum)
Your physician recommends that you schedule a follow-up appointment in: KEEP APPOINTMENT WITH DR. Martinique  Your physician has recommended you make the following change in your medication:   TAKE 120 MG OF DILTIAZEM IN P.M AND 180 MG OF DILTIAZEM IN A.M  Your physician recommends that you return for lab work in: [place]

## 2014-05-01 NOTE — Assessment & Plan Note (Signed)
Stable without chest pain 

## 2014-05-01 NOTE — Assessment & Plan Note (Signed)
We'll check potassium today.

## 2014-05-01 NOTE — Progress Notes (Signed)
HPI:  This is a 80 year old female patient of Dr. Martinique most recently seen by Truitt Merle NP, in April 2015. Patient has history of vocal, CAD status post NSTEMI in 4/14 in setting of rapid atrial fibrillation. High-grade stenosis in the first diagonal branch and a 70% stenosis in the second diagonal managed medically. Also has a history of diastolic heart failure, valvular heart disease and advanced age. She is intolerant to amiodarone and not a candidate for tedious and due to prolonged QT. She is not on anticoagulation given past history of bleeding of falls. She also has history of a lung nodule but family requested no further workup.  Last 2-D echo in 02/2014 EF 60-65% with grade 2 diastolic dysfunction mild AS, moderate to severe MR with question a mobile density at anterior mitral valve leaflet. Patient did not want  cardiac surgery. No fevers and not felt to be endocarditis.  Patient saw Dr. Damita Dunnings and had atrial fib add 117 beats per minute minute he spoke with Dr. Rayann Heman who recommended increasing her diltiazem to 180mg  BID. Patient initially didn't want to do it but agreed and later called that she's taking too many medications. The patient says she gets dizzy in the morning when she takes the diltiazem and losartan together so spaces that out. She complains of severe headache and insomnia from the evening diltiazem dose it did not take it last night. Her palpitations have been better and her breathing is stable. Her weights have been stable. She denies any further palpitations, chest pain, dyspnea at rest orthopnea. She is very slow to move around and gets around in a wheelchair most of the time. She is also complaining of leg cramps at night.  Allergies -- Lipitor [Atorvastatin] -- Other (See Comments)   --  LFT elevation, nausea  -- Morphine And Related -- Other (See Comments)   --  "drives me crazy" and hyperactivity  -- Irbesartan -- Swelling  -- Ramipril -- Swelling   --  REACTION:  lips swelling  -- Telmisartan-Hctz -- Other (See Comments)   --  REACTION: incontinence  -- Amiodarone    --    -- Hydrochlorothiazide    --  hyponatremia  -- Metoprolol    --  Headache, dizzy  -- Tikosyn [Dofetilide]    --  Long QT  -- Norpace [Disopyramide] -- Other (See Comments)   --  Dry mouth  Current Outpatient Prescriptions on File Prior to Visit: aspirin 81 MG chewable tablet, Chew 1 tablet (81 mg total) by mouth daily., Disp: , Rfl:  BESIVANCE 0.6 % SUSP, Place 1 drop into both eyes 2 (two) times daily. , Disp: , Rfl:  diltiazem (CARTIA XT) 180 MG 24 hr capsule, Take 1 capsule (180 mg total) by mouth 2 (two) times daily., Disp: , Rfl:  fish oil-omega-3 fatty acids 1000 MG capsule, Take 1 g by mouth 2 (two) times daily.  , Disp: , Rfl:  furosemide (LASIX) 20 MG tablet, Take 0.5 tab a day.  Take and extra tab daily as needed for fluid (if weight gain >3lbs)., Disp: , Rfl:  HYDROcodone-acetaminophen (NORCO/VICODIN) 5-325 MG per tablet, Take 0.5-1 tablets by mouth 2 (two) times daily as needed., Disp: 90 tablet, Rfl: 0 levothyroxine (SYNTHROID, LEVOTHROID) 25 MCG tablet, Take 25 mcg by mouth every morning., Disp: , Rfl:  losartan (COZAAR) 50 MG tablet, Take 50 mg by mouth daily., Disp: , Rfl:  Multiple Vitamins-Minerals (OCUVITE PRESERVISION) TABS, Take 1 tablet by mouth daily.  , Disp: ,  Rfl:  omeprazole (PRILOSEC) 40 MG capsule, Take 40 mg by mouth daily., Disp: , Rfl:  zolpidem (AMBIEN) 5 MG tablet, Take 1 tablet (5 mg total) by mouth at bedtime as needed for sleep (please allow early refill)., Disp: 30 tablet, Rfl: 2  No current facility-administered medications on file prior to visit.   Past Medical History:   Atrial fibrillation                             04/23-24/*     Comment:a. recurrent PAF with RVR in September 2013. b.              Evaluated 03/2013, previously intolerant to               Norpace and Amiodarone - consider Multaq if               recurs. c. Not on  anticoag due to history of               falls and also some internal bleeding per son.   Hypertrophic cardiomyopathy                                  Urinary incontinence                                         Diverticulosis of colon (without mention of he* 2003/ 09/*     Comment:EGD/colonoscopy Barretts esophagus//H.H               divertics 08/2000   Cervical mass                                                  Comment:C2 lateral mass fracture   Hypertension                                                 Hypercholesterolemia                                           Comment:219/497   Hypothyroidism                                               Multinodular goiter (nontoxic)                               Osteoporosis                                                 Blood transfusion  Jaundice                                        ~ 1935         Comment:"in grade school"   Degenerative joint disease                                     Comment:back   Depression                                                     Comment:"husband died 2011-02-08"   Anemia, iron deficiency                                      Personal history of colonic polyps              02/29/2012      Comment:tubular adenoma   Barrett's esophagus                                          CAD (coronary artery disease)                                  Comment:a. NSTEMI 03/2013: secondary to diagonal disease              (small, not amenable to PCI, for med rx).   Moderate mitral regurgitation                   2014         Mild aortic stenosis                            2014         Lung nodule seen on imaging study, pt does not* 02/08/2014     Mitral valve regurgitation, mod to severe with* 02/08/2014     Mitral valve mass, density - no fevers to sugg* 02/08/2014    Past Surgical History:   BLADDER SURGERY                                                 Comment:bladder tack early  90's   TEAR DUCT PROBING                                07/29/03        Comment:tear duct surg   CYSTOURETHROSCOPY                                09/17/03     ROTATOR CUFF REPAIR                              ?  date; 1*     Comment:left; right( Dr. Gladstone Lighter)   [place] reduction   EYE SURGERY                                      03/2002        Comment:cataract OS   thyroid ultrasound                               10/14/2003     Comment:MNG, no dominant masses   DOPPLER ECHOCARDIOGRAPHY                         03/31/200*     Comment:ECHO, EF wnl, mild stenosis, A.S. mild MR, Mild              T.R03/31/2003//ECHO EF 70%,LVH, ?diast               dysfunction 09/11/2003   CATARACT EXTRACTION W/ INTRAOCULAR LENS  IMPLA*  2003         DILATION AND CURETTAGE OF UTERUS                 09/07/2000      Comment:endometrial polyps removed, path all benign    TONSILLECTOMY AND ADENOIDECTOMY                                 Comment:"as a child"   FRACTURE SURGERY                                 2010           Comment:right knee  Review of patient's family history indicates:   Heart failure                  Mother                     Comment: CHF, DM, HBP   Hypertension                   Mother                   Uterine cancer                 Mother                   Stroke                         Mother                   Colon cancer  Neg Hx                   Esophageal cancer              Neg Hx                   Rectal cancer                  Neg Hx                   Stomach cancer                 Neg Hx                   Social History   Marital Status: Married             Spouse Name:                      Years of Education:                 Number of children: 5           Occupational History Occupation          Fish farm manager            Comment               retired                                   Social History Main Topics   Smoking Status: Former Smoker                   Packs/Day: 0.50  Years: 4         Types: Cigarettes     Quit date: 07/06/1974   Smokeless Status: Never Used                       Alcohol Use: No             Drug Use: No             Sexual Activity: No                 Other Topics            Concern   None on file  Social History Narrative   Married, with 5 children out of the home   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 Neck: No JVD, HJR, Bruit, or thyroid enlargement  Lungs: Decreased breath sounds with fine crackles at the right base Rales at the left base  Cardiovascular: RRR, 3/6 hard systolic murmur at the left sternal border and apex, decreased S2, no bruit, thrill, or heave.  Abdomen: BS normal. Soft without organomegaly, masses, lesions or tenderness.  Extremities: without cyanosis, clubbing or edema. Good distal pulses bilateral  SKin: Warm, no lesions or rashes   Musculoskeletal: No deformities  Neuro: no focal signs  BP 130/46  Pulse 62  Ht 5' (1.524 m)  Wt 130 lb (58.968 kg)  BMI 25.39 kg/m2    EKG: Normal sinus rhythm with LVH poor R wave  progression, no acute change  2-D echo 02/05/14 Study Conclusions  - Left ventricle: The cavity size was normal. There was   moderate concentric hypertrophy. Systolic function was   normal. The estimated ejection fraction was in the range   of 60% to 65%. Wall motion was normal; there were no   regional wall motion abnormalities. Features are   consistent with a pseudonormal left ventricular filling   pattern, with concomitant abnormal relaxation and   increased filling pressure (grade 2 diastolic   dysfunction). - Aortic valve:  Severe thickening and calcification. There   was mild stenosis. Trivial regurgitation. Valve area:   1.18cm^2(VTI). Valve area: 0.96cm^2 (Vmax). - Mitral valve: There appears to be a mobile density off of   the anterior MV leaflet that is only appreciated in the   apical views and may represent a vegetation. Recommend TEE   for further evaluation as well as determine the etiology   of significant MR Moderately calcified annulus. Moderate   to severe regurgitation directed eccentrically and toward   the septum. Valve area by continuity equation (using LVOT   flow): 1.53cm^2. - Left atrium: The atrium was mildly dilated. - Right ventricle: The cavity size was mildly dilated. - Tricuspid valve: Moderate regurgitation. - Pulmonary arteries: PA peak pressure: 56mm Hg (S). Impressions:  - The right ventricular systolic pressure was increased   consistent with mild pulmonary hypertension. Transthoracic echocardiography.  M-mode, complete 2D, spectral Doppler, and color Doppler.  Height:  Height: 152.4cm. Height: 60in.  Weight:  Weight: 58.4kg. Weight: 128.5lb.  Body mass index:  BMI: 25.1kg/m^2.  Body surface area:    BSA: 1.55m^2.  Blood pressure:     102/98.  Patient status:  Inpatient.  Location:  ICU/CCU

## 2014-05-04 ENCOUNTER — Other Ambulatory Visit: Payer: Self-pay | Admitting: Family Medicine

## 2014-05-13 ENCOUNTER — Ambulatory Visit: Payer: Medicare Other | Admitting: Cardiology

## 2014-05-20 ENCOUNTER — Ambulatory Visit (INDEPENDENT_AMBULATORY_CARE_PROVIDER_SITE_OTHER): Payer: Medicare Other | Admitting: Cardiology

## 2014-05-20 ENCOUNTER — Encounter: Payer: Self-pay | Admitting: Cardiology

## 2014-05-20 VITALS — BP 108/60 | HR 56 | Ht 60.0 in | Wt 130.4 lb

## 2014-05-20 DIAGNOSIS — I059 Rheumatic mitral valve disease, unspecified: Secondary | ICD-10-CM

## 2014-05-20 DIAGNOSIS — I4891 Unspecified atrial fibrillation: Secondary | ICD-10-CM

## 2014-05-20 DIAGNOSIS — I340 Nonrheumatic mitral (valve) insufficiency: Secondary | ICD-10-CM

## 2014-05-20 DIAGNOSIS — I5031 Acute diastolic (congestive) heart failure: Secondary | ICD-10-CM

## 2014-05-20 DIAGNOSIS — I509 Heart failure, unspecified: Secondary | ICD-10-CM

## 2014-05-20 DIAGNOSIS — I2510 Atherosclerotic heart disease of native coronary artery without angina pectoris: Secondary | ICD-10-CM

## 2014-05-20 DIAGNOSIS — I421 Obstructive hypertrophic cardiomyopathy: Secondary | ICD-10-CM

## 2014-05-20 NOTE — Progress Notes (Signed)
Cindy [name] Date of Birth: 10-30-1922 Medical Record #[id number]  History of Present Illness: Cindy [name] is seen back today for a follow up visit. She is a  80 year old female with HOCM, CAD with past NSTEMI in 03/2013 in the setting of rapid AF - has high grade stenosis in the first diagonal branch (moderate vessel in length but small in caliber) and a 70% stenosis in the 2nd DX - she was managed medically, PAF, diastolic HF, valvular heart disease and advanced age. EF is normal by echo from April of 2014 but with grade II diastolic dysfunction, mild AS, mild MR and mild MS. She is intolerant to amiodarone and not a candidate for Tikosyn due to prolonged QT - not a candidate for anticoagulation given past history of bleeding and falls.   Admitted back in March with an acute onset of shortness of breath and hypoxia. Treated with oxygen, CPAP and diuresis. BP quite high and she was given IV NTG. Mild elevation in troponin. CXR with lung nodule - family did not wish to work up further. Echo updated - moderate to severe MR with a mobile density on the mitral valve - no fevers and not felt to be endocarditis - she did not wish to pursue cardiac surgery.   Seen back after being home for 2 weeks. Seemed to be holding her own. Weight was fairly stable.  Resistant to moving in with her family. She was taking 1/2 the HCTZ twice a day - she wished to continue.   On follow up with Dr. Damita Dunnings she had her HCTZ stopped due to hyponatremia and placed on lasix. He started low dose beta blocker. She ended up not tolerating this - was dizzy and "horribly sick". Subsequently place on diltiazem. When seen by Dr. Damita Dunnings in May HR 117. Diltiazem increased to 180 mg bid. This was later reduced to 180 mg in the am and 120 mg in the pm due to side effects.  Comes back today. Here alone today. States she is not feeling well.   She gives out easily. Complains of HA. No real palpitations. No swelling. Says her breathing is  about the same.  Doesn't like taking all these medications.   Current Outpatient Prescriptions  Medication Sig Dispense Refill  . aspirin 81 MG chewable tablet Chew 1 tablet (81 mg total) by mouth daily.      Marland Kitchen BESIVANCE 0.6 % SUSP Place 1 drop into both eyes 2 (two) times daily.       Marland Kitchen diltiazem (CARDIZEM CD) 120 MG 24 hr capsule Take 1 capsule (120 mg total) by mouth daily. TAKE IN P.M  30 capsule  3  . diltiazem (CARTIA XT) 180 MG 24 hr capsule Take 1 capsule (180 mg total) by mouth daily. TAKE IN A.M  30 capsule  3  . fish oil-omega-3 fatty acids 1000 MG capsule Take 1 g by mouth 2 (two) times daily.        . furosemide (LASIX) 20 MG tablet Take 0.5 tab a day.  Take and extra tab daily as needed for fluid (if weight gain >3lbs).      Marland Kitchen HYDROcodone-acetaminophen (NORCO/VICODIN) 5-325 MG per tablet Take 0.5-1 tablets by mouth 2 (two) times daily as needed.  90 tablet  0  . levothyroxine (SYNTHROID, LEVOTHROID) 25 MCG tablet Take 25 mcg by mouth every morning.      Marland Kitchen levothyroxine (SYNTHROID, LEVOTHROID) 25 MCG tablet TAKE 1 TABLET EVERY MORNING  90 tablet  1  . losartan (COZAAR) 50 MG tablet Take 50 mg by mouth daily.      . Multiple Vitamins-Minerals (OCUVITE PRESERVISION) TABS Take 1 tablet by mouth daily.        Marland Kitchen omeprazole (PRILOSEC) 40 MG capsule Take 40 mg by mouth daily.      Marland Kitchen zolpidem (AMBIEN) 5 MG tablet Take 1 tablet (5 mg total) by mouth at bedtime as needed for sleep (please allow early refill).  30 tablet  2   No current facility-administered medications for this visit.    Allergies  Allergen Reactions  . Lipitor [Atorvastatin] Other (See Comments)    LFT elevation, nausea  . Morphine And Related Other (See Comments)    "drives me crazy" and hyperactivity  . Irbesartan Swelling  . Ramipril Swelling    REACTION: lips swelling  . Telmisartan-Hctz Other (See Comments)    REACTION: incontinence  . Amiodarone        . Hydrochlorothiazide     hyponatremia  . Metoprolol      Headache, dizzy  . Tikosyn [Dofetilide]     Long QT  . Norpace [Disopyramide] Other (See Comments)    Dry mouth    Past Medical History  Diagnosis Date  . Atrial fibrillation 04/23-24/2007    a. recurrent PAF with RVR in September 2013. b. Evaluated 03/2013, previously intolerant to Norpace and Amiodarone - consider Multaq if recurs. c. Not on anticoag due to history of falls and also some internal bleeding per son.  . Hypertrophic cardiomyopathy   . Urinary incontinence   . Diverticulosis of colon (without mention of hemorrhage) 2003/ 08/2000    EGD/colonoscopy Barretts esophagus//H.H divertics 08/2000  . Cervical mass     C2 lateral mass fracture  . Hypertension   . Hypercholesterolemia     219/497  . Hypothyroidism   . Multinodular goiter (nontoxic)   . Osteoporosis   . Blood transfusion   . Jaundice ~ 1935    "in grade school"  . Degenerative joint disease     back  . Depression     "husband died 02/03/11"  . Anemia, iron deficiency   . Personal history of colonic polyps 02/29/2012    tubular adenoma  . Barrett's esophagus   . CAD (coronary artery disease)     a. NSTEMI 03/2013: secondary to diagonal disease (small, not amenable to PCI, for med rx).  . Moderate mitral regurgitation 2014  . Mild aortic stenosis 2014  . Lung nodule seen on imaging study, pt does not wish further work up 02/08/2014  . Mitral valve regurgitation, mod to severe with moderately calcified annulus 02/08/2014  . Mitral valve mass, density - no fevers to suggest endocarditis 02/08/2014    Past Surgical History  Procedure Laterality Date  . Bladder surgery      bladder tack early 90's  . Tear duct probing  07/29/03    tear duct surg  . Cystourethroscopy  09/17/03  . Rotator cuff repair  ? date; 09/07/05    left; right( Dr. Gladstone Lighter)  . Appendectomy  1941  . Breast surgery  1981    breast reduction  . Eye surgery  03/2002    cataract OS  . Thyroid ultrasound  10/14/2003    MNG, no dominant  masses  . Doppler echocardiography  03/05/2002&09/11/2003    ECHO, EF wnl, mild stenosis, A.S. mild MR, Mild T.R03/31/2003//ECHO EF 70%,LVH, ?diast dysfunction 09/11/2003  . Cataract extraction w/ intraocular lens  implant, bilateral  2003  .  Dilation and curettage of uterus  09/07/2000    endometrial polyps removed, path all benign   . Tonsillectomy and adenoidectomy      "as a child"  . Fracture surgery  2010    right knee    History  Smoking status  . Former Smoker -- 0.50 packs/day for 4 years  . Types: Cigarettes  . Quit date: 07/06/1974  Smokeless tobacco  . Never Used    History  Alcohol Use No    Family History  Problem Relation Age of Onset  . Heart failure Mother     CHF, DM, HBP  . Hypertension Mother   . Uterine cancer Mother   . Stroke Mother   . Colon cancer Neg Hx   . Esophageal cancer Neg Hx   . Rectal cancer Neg Hx   . Stomach cancer Neg Hx     Review of Systems: The review of systems is per the HPI.  All other systems were reviewed and are negative.  Physical Exam: BP 108/60  Pulse 56  Ht 5' (1.524 m)  Wt 130 lb 6.4 oz (59.149 kg)  BMI 25.47 kg/m2  SpO2 96% Patient is a pleasant, elderly WF who appears chronically ill.  Skin is warm and dry. Color is normal.  HEENT is unremarkable. Normocephalic/atraumatic. PERRL. Sclera are nonicteric. Neck is supple. No masses. No JVD. Lungs reveal mild basilar rales. Cardiac exam shows a regular rate and rhythm. Harsh 2/6 outflow murmur noted. Abdomen is soft. Extremities are without edema. She is in a wheelchair. No gross neurologic deficits noted.  Wt Readings from Last 3 Encounters:  05/20/14 130 lb 6.4 oz (59.149 kg)  05/01/14 130 lb (58.968 kg)  04/25/14 128 lb 8 oz (58.287 kg)     LABORATORY DATA:    Lab Results  Component Value Date   WBC 6.8 03/07/2014   HGB 11.0* 03/07/2014   HCT 35.5* 03/07/2014   PLT 286 03/07/2014   GLUCOSE 119* 05/01/2014   CHOL 160 08/30/2012   TRIG 77 08/30/2012   HDL 62  08/30/2012   LDLDIRECT 132.8 08/16/2007   LDLCALC 83 08/30/2012   ALT 10 08/13/2013   AST 16 08/13/2013   NA 133* 05/01/2014   K 4.2 05/01/2014   CL 101 05/01/2014   CREATININE 0.8 05/01/2014   BUN 16 05/01/2014   CO2 24 05/01/2014   TSH 0.737 02/05/2014   INR 0.92 08/13/2013   MICROALBUR 0.4 02/11/2010   Echo Study Conclusions from March 2015  - Left ventricle: The cavity size was normal. There was moderate concentric hypertrophy. Systolic function was normal. The estimated ejection fraction was in the range of 60% to 65%. Wall motion was normal; there were no regional wall motion abnormalities. Features are consistent with a pseudonormal left ventricular filling pattern, with concomitant abnormal relaxation and increased filling pressure (grade 2 diastolic dysfunction). - Aortic valve: Severe thickening and calcification. There was mild stenosis. Trivial regurgitation. Valve area: 1.18cm^2(VTI). Valve area: 0.96cm^2 (Vmax). - Mitral valve: There appears to be a mobile density off of the anterior MV leaflet that is only appreciated in the apical views and may represent a vegetation. Recommend TEE for further evaluation as well as determine the etiology of significant MR Moderately calcified annulus. Moderate to severe regurgitation directed eccentrically and toward the septum. Valve area by continuity equation (using LVOT flow): 1.53cm^2. - Left atrium: The atrium was mildly dilated. - Right ventricle: The cavity size was mildly dilated. - Tricuspid valve: Moderate regurgitation. - Pulmonary  arteries: PA peak pressure: 46mm Hg (S).    Assessment / Plan:  1. Valvular heart disease -mod/severe MR, mild AS- to manage conservatively.   2. Chronic diastolic CHF. Weight is stable. No edema. Some persistent rales. On low dose lasix. Will continue current therapy.  3. CAD - managed medically. No chest pain reported.   4. PAF - Has been quite intolerant to lots of medicines, now to include  metoprolol. Will keep her on her current regimen. Not a candidate for anticoagulation. I don't think she would tolerate higher diltiazem dose.  5. Advanced age   86.? Pulmonary nodule - this was a 1 view film - she was not interested in further evaluation

## 2014-05-20 NOTE — Patient Instructions (Signed)
Continue your current therapy  I will see you in 2 months.   

## 2014-06-25 ENCOUNTER — Encounter (HOSPITAL_COMMUNITY): Payer: Self-pay | Admitting: Emergency Medicine

## 2014-06-25 ENCOUNTER — Emergency Department (HOSPITAL_COMMUNITY): Payer: Medicare Other

## 2014-06-25 ENCOUNTER — Observation Stay (HOSPITAL_COMMUNITY)
Admission: EM | Admit: 2014-06-25 | Discharge: 2014-06-26 | Disposition: A | Discharge status: Home / Self Care | Payer: Medicare Other | Attending: Internal Medicine | Admitting: Internal Medicine

## 2014-06-25 DIAGNOSIS — I5031 Acute diastolic (congestive) heart failure: Secondary | ICD-10-CM | POA: Diagnosis present

## 2014-06-25 DIAGNOSIS — I509 Heart failure, unspecified: Secondary | ICD-10-CM

## 2014-06-25 DIAGNOSIS — D509 Iron deficiency anemia, unspecified: Secondary | ICD-10-CM | POA: Diagnosis present

## 2014-06-25 DIAGNOSIS — I2510 Atherosclerotic heart disease of native coronary artery without angina pectoris: Secondary | ICD-10-CM | POA: Diagnosis not present

## 2014-06-25 DIAGNOSIS — Z860100 Personal history of colon polyps, unspecified: Secondary | ICD-10-CM | POA: Insufficient documentation

## 2014-06-25 DIAGNOSIS — I480 Paroxysmal atrial fibrillation: Secondary | ICD-10-CM | POA: Diagnosis present

## 2014-06-25 DIAGNOSIS — D649 Anemia, unspecified: Secondary | ICD-10-CM | POA: Insufficient documentation

## 2014-06-25 DIAGNOSIS — G8929 Other chronic pain: Secondary | ICD-10-CM | POA: Diagnosis not present

## 2014-06-25 DIAGNOSIS — I5033 Acute on chronic diastolic (congestive) heart failure: Secondary | ICD-10-CM | POA: Diagnosis present

## 2014-06-25 DIAGNOSIS — Z87891 Personal history of nicotine dependence: Secondary | ICD-10-CM | POA: Diagnosis not present

## 2014-06-25 DIAGNOSIS — I4891 Unspecified atrial fibrillation: Secondary | ICD-10-CM | POA: Diagnosis not present

## 2014-06-25 DIAGNOSIS — I272 Pulmonary hypertension, unspecified: Secondary | ICD-10-CM | POA: Diagnosis present

## 2014-06-25 DIAGNOSIS — N390 Urinary tract infection, site not specified: Secondary | ICD-10-CM | POA: Diagnosis not present

## 2014-06-25 DIAGNOSIS — Z8659 Personal history of other mental and behavioral disorders: Secondary | ICD-10-CM | POA: Insufficient documentation

## 2014-06-25 DIAGNOSIS — R109 Unspecified abdominal pain: Secondary | ICD-10-CM | POA: Diagnosis present

## 2014-06-25 DIAGNOSIS — Z8739 Personal history of other diseases of the musculoskeletal system and connective tissue: Secondary | ICD-10-CM | POA: Diagnosis not present

## 2014-06-25 DIAGNOSIS — K625 Hemorrhage of anus and rectum: Secondary | ICD-10-CM | POA: Insufficient documentation

## 2014-06-25 DIAGNOSIS — Z7982 Long term (current) use of aspirin: Secondary | ICD-10-CM | POA: Diagnosis not present

## 2014-06-25 DIAGNOSIS — Z79899 Other long term (current) drug therapy: Secondary | ICD-10-CM | POA: Diagnosis not present

## 2014-06-25 DIAGNOSIS — R011 Cardiac murmur, unspecified: Secondary | ICD-10-CM | POA: Diagnosis not present

## 2014-06-25 DIAGNOSIS — I10 Essential (primary) hypertension: Secondary | ICD-10-CM | POA: Insufficient documentation

## 2014-06-25 DIAGNOSIS — E039 Hypothyroidism, unspecified: Secondary | ICD-10-CM | POA: Diagnosis present

## 2014-06-25 DIAGNOSIS — R0602 Shortness of breath: Secondary | ICD-10-CM | POA: Diagnosis present

## 2014-06-25 HISTORY — DX: Unspecified hearing loss, unspecified ear: H91.90

## 2014-06-25 LAB — CBC WITH DIFFERENTIAL/PLATELET
Basophils Absolute: 0 10*3/uL (ref 0.0–0.1)
Basophils Relative: 0 % (ref 0–1)
EOS ABS: 0 10*3/uL (ref 0.0–0.7)
Eosinophils Relative: 1 % (ref 0–5)
HCT: 33.9 % — ABNORMAL LOW (ref 36.0–46.0)
HEMOGLOBIN: 9.9 g/dL — AB (ref 12.0–15.0)
LYMPHS ABS: 0.8 10*3/uL (ref 0.7–4.0)
LYMPHS PCT: 15 % (ref 12–46)
MCH: 22.1 pg — ABNORMAL LOW (ref 26.0–34.0)
MCHC: 29.2 g/dL — ABNORMAL LOW (ref 30.0–36.0)
MCV: 75.8 fL — AB (ref 78.0–100.0)
MONOS PCT: 11 % (ref 3–12)
Monocytes Absolute: 0.6 10*3/uL (ref 0.1–1.0)
NEUTROS PCT: 73 % (ref 43–77)
Neutro Abs: 3.9 10*3/uL (ref 1.7–7.7)
Platelets: 230 10*3/uL (ref 150–400)
RBC: 4.47 MIL/uL (ref 3.87–5.11)
RDW: 15.9 % — ABNORMAL HIGH (ref 11.5–15.5)
WBC: 5.3 10*3/uL (ref 4.0–10.5)

## 2014-06-25 LAB — RETICULOCYTES
RBC.: 4.16 MIL/uL (ref 3.87–5.11)
RETIC COUNT ABSOLUTE: 49.9 10*3/uL (ref 19.0–186.0)
RETIC CT PCT: 1.2 % (ref 0.4–3.1)

## 2014-06-25 LAB — URINALYSIS, ROUTINE W REFLEX MICROSCOPIC
BILIRUBIN URINE: NEGATIVE
GLUCOSE, UA: NEGATIVE mg/dL
HGB URINE DIPSTICK: NEGATIVE
KETONES UR: NEGATIVE mg/dL
Nitrite: NEGATIVE
PH: 7.5 (ref 5.0–8.0)
PROTEIN: NEGATIVE mg/dL
Specific Gravity, Urine: 1.009 (ref 1.005–1.030)
Urobilinogen, UA: 0.2 mg/dL (ref 0.0–1.0)

## 2014-06-25 LAB — COMPREHENSIVE METABOLIC PANEL
ALK PHOS: 71 U/L (ref 39–117)
ALT: 10 U/L (ref 0–35)
AST: 17 U/L (ref 0–37)
Albumin: 3.2 g/dL — ABNORMAL LOW (ref 3.5–5.2)
Anion gap: 13 (ref 5–15)
BUN: 12 mg/dL (ref 6–23)
CO2: 26 meq/L (ref 19–32)
Calcium: 8.6 mg/dL (ref 8.4–10.5)
Chloride: 100 mEq/L (ref 96–112)
Creatinine, Ser: 0.76 mg/dL (ref 0.50–1.10)
GFR, EST AFRICAN AMERICAN: 84 mL/min — AB (ref >90)
GFR, EST NON AFRICAN AMERICAN: 72 mL/min — AB (ref >90)
GLUCOSE: 94 mg/dL (ref 70–99)
POTASSIUM: 4.2 meq/L (ref 3.7–5.3)
Sodium: 139 mEq/L (ref 137–147)
TOTAL PROTEIN: 6.1 g/dL (ref 6.0–8.3)
Total Bilirubin: 0.3 mg/dL (ref 0.3–1.2)

## 2014-06-25 LAB — POC OCCULT BLOOD, ED: FECAL OCCULT BLD: NEGATIVE

## 2014-06-25 LAB — URINE MICROSCOPIC-ADD ON

## 2014-06-25 LAB — IRON AND TIBC
Iron: 19 ug/dL — ABNORMAL LOW (ref 42–135)
Saturation Ratios: 5 % — ABNORMAL LOW (ref 20–55)
TIBC: 387 ug/dL (ref 250–470)
UIBC: 368 ug/dL (ref 125–400)

## 2014-06-25 LAB — TSH: TSH: 1.25 u[IU]/mL (ref 0.350–4.500)

## 2014-06-25 LAB — TROPONIN I: Troponin I: <0.3 ng/mL (ref <0.30)

## 2014-06-25 LAB — PRO B NATRIURETIC PEPTIDE: Pro B Natriuretic peptide (BNP): 962.7 pg/mL — ABNORMAL HIGH (ref 0–450)

## 2014-06-25 MED ORDER — GATIFLOXACIN 0.5 % OP SOLN
1.0000 [drp] | Freq: Two times a day (BID) | OPHTHALMIC | Status: DC
  Administered 2014-06-25 – 2014-06-26 (×2): 1 [drp] via OPHTHALMIC
  Filled 2014-06-25: qty 2.5

## 2014-06-25 MED ORDER — SODIUM CHLORIDE 0.9 % IJ SOLN: 3.0000 mL | INTRAMUSCULAR | Status: DC | PRN

## 2014-06-25 MED ORDER — ONDANSETRON HCL 4 MG/2ML IJ SOLN: 4.0000 mg | Freq: Four times a day (QID) | INTRAMUSCULAR | Status: DC | PRN

## 2014-06-25 MED ORDER — ZOLPIDEM TARTRATE 5 MG PO TABS
5.0000 mg | ORAL_TABLET | Freq: Every evening | ORAL | Status: DC | PRN
  Administered 2014-06-25: 5 mg via ORAL
  Filled 2014-06-25: qty 1

## 2014-06-25 MED ORDER — FUROSEMIDE 40 MG PO TABS
40.0000 mg | ORAL_TABLET | Freq: Two times a day (BID) | ORAL | Status: DC
  Administered 2014-06-25 – 2014-06-26 (×2): 40 mg via ORAL
  Filled 2014-06-25 (×4): qty 1

## 2014-06-25 MED ORDER — DEXTROSE 5 % IV SOLN
1.0000 g | Freq: Once | INTRAVENOUS | Status: AC
  Administered 2014-06-25: 1 g via INTRAVENOUS
  Filled 2014-06-25: qty 10

## 2014-06-25 MED ORDER — PANTOPRAZOLE SODIUM 40 MG PO TBEC
80.0000 mg | DELAYED_RELEASE_TABLET | Freq: Every day | ORAL | Status: DC
  Administered 2014-06-26: 80 mg via ORAL
  Filled 2014-06-25: qty 2

## 2014-06-25 MED ORDER — DILTIAZEM HCL ER COATED BEADS 180 MG PO CP24
180.0000 mg | ORAL_CAPSULE | Freq: Every day | ORAL | Status: DC
  Administered 2014-06-26: 180 mg via ORAL
  Filled 2014-06-25: qty 1

## 2014-06-25 MED ORDER — ENOXAPARIN SODIUM 40 MG/0.4ML ~~LOC~~ SOLN
40.0000 mg | SUBCUTANEOUS | Status: DC
  Administered 2014-06-25: 40 mg via SUBCUTANEOUS
  Filled 2014-06-25 (×2): qty 0.4

## 2014-06-25 MED ORDER — ACETAMINOPHEN 325 MG PO TABS
650.0000 mg | ORAL_TABLET | ORAL | Status: DC | PRN
  Administered 2014-06-25: 650 mg via ORAL
  Filled 2014-06-25: qty 2

## 2014-06-25 MED ORDER — LEVOTHYROXINE SODIUM 25 MCG PO TABS
25.0000 ug | ORAL_TABLET | Freq: Every morning | ORAL | Status: DC
  Administered 2014-06-26: 25 ug via ORAL
  Filled 2014-06-25: qty 1

## 2014-06-25 MED ORDER — SODIUM CHLORIDE 0.9 % IV SOLN: 250.0000 mL | INTRAVENOUS | Status: DC | PRN

## 2014-06-25 MED ORDER — ASPIRIN 81 MG PO CHEW: 81.0000 mg | CHEWABLE_TABLET | Freq: Every day | ORAL | Status: DC

## 2014-06-25 MED ORDER — ACETAMINOPHEN 325 MG PO TABS
650.0000 mg | ORAL_TABLET | Freq: Once | ORAL | Status: AC
  Administered 2014-06-25: 650 mg via ORAL
  Filled 2014-06-25: qty 2

## 2014-06-25 MED ORDER — SODIUM CHLORIDE 0.9 % IJ SOLN
3.0000 mL | Freq: Two times a day (BID) | INTRAMUSCULAR | Status: DC
  Administered 2014-06-25 – 2014-06-26 (×2): 3 mL via INTRAVENOUS

## 2014-06-25 MED ORDER — LOSARTAN POTASSIUM 50 MG PO TABS
50.0000 mg | ORAL_TABLET | Freq: Every day | ORAL | Status: DC
  Administered 2014-06-26: 50 mg via ORAL
  Filled 2014-06-25: qty 1

## 2014-06-25 MED ORDER — DEXTROSE 5 % IV SOLN
1.0000 g | Freq: Once | INTRAVENOUS | Status: DC
  Filled 2014-06-25: qty 10

## 2014-06-25 NOTE — ED Notes (Signed)
Placed ice pack on pt's right wrist due to swelling from previous IV

## 2014-06-25 NOTE — ED Notes (Signed)
Attempted to call report

## 2014-06-25 NOTE — ED Provider Notes (Signed)
CSN: [id number]     Arrival date & time 06/25/14  4854 History   First MD Initiated Contact with Patient 06/25/14 1001     Chief Complaint  Patient presents with  . Shortness of Breath     (Consider location/radiation/quality/duration/timing/severity/associated sxs/prior Treatment) HPI Patient states yesterday afternoon she started having some shortness of breath and feeling tired. She initially stated she took her blood pressure and it was high however she states it was 110/50. She states her blood pressure usually is in the 627 systolic range. She denies any chest pain but states she's had "some" cough that is "pale" she states she feels like her heart has been racing. She states she was feeling "dizzy and swimmy headed". She reports she's been having these symptoms off and on at home however today was the longest it has lasted. She states currently she feels better however she still feels weak. She denies any swelling of her extremities. She also states she has chronic low back pain.  Patient reports she's been having some right lower quadrant pain off and on for a few weeks. She states she gets a sharp jab of pain only lasts a second.  She also states she gets chronic headaches and she has a headache that starts in the front and radiates to the back. This is her usual typical headaches that she gets frequently.  PCP Dr Damita Dunnings [facility] Dr [place]  Past Medical History  Diagnosis Date  . Atrial fibrillation 04/23-24/2007    a. recurrent PAF with RVR in September 2013. b. Evaluated 03/2013, previously intolerant to Norpace and Amiodarone - consider Multaq if recurs. c. Not on anticoag due to history of falls and also some internal bleeding per son.  . Hypertrophic cardiomyopathy   . Urinary incontinence   . Diverticulosis of colon (without mention of hemorrhage) 2003/ 08/2000    EGD/colonoscopy Barretts esophagus//H.H divertics 08/2000  . Cervical mass     C2 lateral mass fracture  .  Hypertension   . Hypercholesterolemia     219/497  . Hypothyroidism   . Multinodular goiter (nontoxic)   . Osteoporosis   . Blood transfusion   . Jaundice ~ 1935    "in grade school"  . Degenerative joint disease     back  . Depression     "husband died February 20, 2011"  . Anemia, iron deficiency   . Personal history of colonic polyps 02/29/2012    tubular adenoma  . Barrett's esophagus   . CAD (coronary artery disease)     a. NSTEMI 03/2013: secondary to diagonal disease (small, not amenable to PCI, for med rx).  . Moderate mitral regurgitation 2014  . Mild aortic stenosis 2014  . Lung nodule seen on imaging study, pt does not wish further work up 02/08/2014  . Mitral valve regurgitation, mod to severe with moderately calcified annulus 02/08/2014  . Mitral valve mass, density - no fevers to suggest endocarditis 02/08/2014   Past Surgical History  Procedure Laterality Date  . Bladder surgery      bladder tack early 90's  . Tear duct probing  07/29/03    tear duct surg  . Cystourethroscopy  09/17/03  . Rotator cuff repair  ? date; 09/07/05    left; right( Dr. Gladstone Lighter)  . Appendectomy  1941  . Breast surgery  1981    breast reduction  . Eye surgery  03/2002    cataract OS  . Thyroid ultrasound  10/14/2003    MNG, no dominant masses  .  Doppler echocardiography  03/05/2002&09/11/2003    ECHO, EF wnl, mild stenosis, A.S. mild MR, Mild T.R03/31/2003//ECHO EF 70%,LVH, ?diast dysfunction 09/11/2003  . Cataract extraction w/ intraocular lens  implant, bilateral  2003  . Dilation and curettage of uterus  09/07/2000    endometrial polyps removed, path all benign   . Tonsillectomy and adenoidectomy      "as a child"  . Fracture surgery  2010    right knee   Family History  Problem Relation Age of Onset  . Heart failure Mother     CHF, DM, HBP  . Hypertension Mother   . Uterine cancer Mother   . Stroke Mother   . Colon cancer Neg Hx   . Esophageal cancer Neg Hx   . Rectal cancer Neg Hx    . Stomach cancer Neg Hx    History  Substance Use Topics  . Smoking status: Former Smoker -- 0.50 packs/day for 4 years    Types: Cigarettes    Quit date: 07/06/1974  . Smokeless tobacco: Never Used  . Alcohol Use: No   Lives at home Lives alone Has a caregiver come in every morning  OB History   Grav Para Term Preterm Abortions TAB SAB Ect Mult Living                 Review of Systems  All other systems reviewed and are negative.     Allergies  Lipitor; Morphine and related; Irbesartan; Ramipril; Telmisartan-hctz; Amiodarone; Hydrochlorothiazide; Metoprolol; Tikosyn; and Norpace  Home Medications   Prior to Admission medications   Medication Sig Start Date End Date Taking? Authorizing Provider  aspirin 81 MG chewable tablet Chew 1 tablet (81 mg total) by mouth daily. 03/29/13  Yes Dayna N Dunn, PA-C  BESIVANCE 0.6 % SUSP Place 1 drop into both eyes 2 (two) times daily.  12/21/12  Yes Historical Provider, MD  diltiazem (CARTIA XT) 180 MG 24 hr capsule Take 1 capsule (180 mg total) by mouth daily. TAKE IN A.M 05/01/14  Yes Imogene Burn, PA-C  fish oil-omega-3 fatty acids 1000 MG capsule Take 1 g by mouth daily.    Yes Historical Provider, MD  furosemide (LASIX) 20 MG tablet Take 0.5 tab a day.  Take and extra tab daily as needed for fluid (if weight gain >3lbs). 04/03/14  Yes Tonia Ghent, MD  HYDROcodone-acetaminophen (NORCO/VICODIN) 5-325 MG per tablet Take 0.5-1 tablets by mouth 2 (two) times daily as needed. 04/17/14  Yes Tonia Ghent, MD  levothyroxine (SYNTHROID, LEVOTHROID) 25 MCG tablet Take 25 mcg by mouth every morning.   Yes Historical Provider, MD  losartan (COZAAR) 50 MG tablet Take 50 mg by mouth daily. 02/08/14  Yes Cecilie Kicks, NP  Multiple Vitamins-Minerals (OCUVITE PRESERVISION) TABS Take 1 tablet by mouth 2 (two) times daily.    Yes Historical Provider, MD  omeprazole (PRILOSEC) 40 MG capsule Take 40 mg by mouth daily.   Yes Historical Provider, MD   zolpidem (AMBIEN) 5 MG tablet Take 1 tablet (5 mg total) by mouth at bedtime as needed for sleep (please allow early refill). 04/25/14  Yes Tonia Ghent, MD   ED Triage Vitals  Enc Vitals Group     BP 06/25/14 0928 117/81 mmHg     Pulse Rate 06/25/14 0928 103     Resp 06/25/14 0928 18     Temp 06/25/14 0928 98.2 F (36.8 C)     Temp src 06/25/14 0928 Oral     SpO2  06/25/14 0928 98 %     Weight 06/25/14 0928 130 lb (58.968 kg)     Height 06/25/14 0928 5\' 3"  (1.6 m)     Head Cir --      Peak Flow --      Pain Score --      Pain Loc --      Pain Edu? --      Excl. in [place]? --    Vital signs normal except for tachycardia   Physical Exam  Nursing note and vitals reviewed. Constitutional: She is oriented to person, place, and time. She appears well-developed and well-nourished.  Non-toxic appearance. She does not appear ill. No distress.  Elderly female who is hard of hearing  HENT:  Head: Normocephalic and atraumatic.  Right Ear: External ear normal.  Left Ear: External ear normal.  Nose: Nose normal. No mucosal edema or rhinorrhea.  Mouth/Throat: Oropharynx is clear and moist and mucous membranes are normal. No dental abscesses or uvula swelling.  Eyes: Conjunctivae and EOM are normal. Pupils are equal, round, and reactive to light.  Neck: Normal range of motion and full passive range of motion without pain. Neck supple.  Cardiovascular: Normal rate and regular rhythm.  Exam reveals no gallop and no friction rub.   Murmur heard. Patient has a harsh systolic murmur heard best in the left upper sternal border.  Pulmonary/Chest: Effort normal and breath sounds normal. No respiratory distress. She has no wheezes. She has no rhonchi. She has no rales. She exhibits no tenderness and no crepitus.  Abdominal: Soft. Normal appearance and bowel sounds are normal. She exhibits no distension. There is no tenderness. There is no rebound and no guarding.  Musculoskeletal: Normal range of  motion. She exhibits no edema and no tenderness.  Moves all extremities well.   Neurological: She is alert and oriented to person, place, and time. She has normal strength. No cranial nerve deficit.  Skin: Skin is warm, dry and intact. No rash noted. No erythema. No pallor.  Psychiatric: She has a normal mood and affect. Her speech is normal and behavior is normal. Her mood appears not anxious.    ED Course  Procedures (including critical care time)  Medications  cefTRIAXone (ROCEPHIN) 1 g in dextrose 5 % 50 mL IVPB (1 g Intravenous New Bag/Given 06/25/14 1350)  acetaminophen (TYLENOL) tablet 650 mg (650 mg Oral Given 06/25/14 1154)     I initially went to interview patient at 1023 however she was on the bedside commode. When I went back a short while later she was noted to be in normal sinus rhythm on her monitor with heart rate of 62. Repeat EKG was done. Patient states she was feeling better.  Patient was given IV Rocephin for presumed UTI.  When nursing staff went to get patient's Hemoccult patient did state she had a bloody stool 2 days ago that she attributed to eating tomatoes. The nurse reports there was dried blood around her rectum.  13:57 Dr Lyman Speller, admit to tele, team 10, obse   Labs Review Results for orders placed during the hospital encounter of 06/25/14  CBC WITH DIFFERENTIAL      Result Value Ref Range   WBC 5.3  4.0 - 10.5 K/uL   RBC 4.47  3.87 - 5.11 MIL/uL   Hemoglobin 9.9 (*) 12.0 - 15.0 g/dL   HCT 33.9 (*) 36.0 - 46.0 %   MCV 75.8 (*) 78.0 - 100.0 fL   MCH 22.1 (*) 26.0 -  34.0 pg   MCHC 29.2 (*) 30.0 - 36.0 g/dL   RDW 15.9 (*) 11.5 - 15.5 %   Platelets 230  150 - 400 K/uL   Neutrophils Relative % 73  43 - 77 %   Neutro Abs 3.9  1.7 - 7.7 K/uL   Lymphocytes Relative 15  12 - 46 %   Lymphs Abs 0.8  0.7 - 4.0 K/uL   Monocytes Relative 11  3 - 12 %   Monocytes Absolute 0.6  0.1 - 1.0 K/uL   Eosinophils Relative 1  0 - 5 %   Eosinophils Absolute 0.0   0.0 - 0.7 K/uL   Basophils Relative 0  0 - 1 %   Basophils Absolute 0.0  0.0 - 0.1 K/uL  COMPREHENSIVE METABOLIC PANEL      Result Value Ref Range   Sodium 139  137 - 147 mEq/L   Potassium 4.2  3.7 - 5.3 mEq/L   Chloride 100  96 - 112 mEq/L   CO2 26  19 - 32 mEq/L   Glucose, Bld 94  70 - 99 mg/dL   BUN 12  6 - 23 mg/dL   Creatinine, Ser 0.76  0.50 - 1.10 mg/dL   Calcium 8.6  8.4 - 10.5 mg/dL   Total Protein 6.1  6.0 - 8.3 g/dL   Albumin 3.2 (*) 3.5 - 5.2 g/dL   AST 17  0 - 37 U/L   ALT 10  0 - 35 U/L   Alkaline Phosphatase 71  39 - 117 U/L   Total Bilirubin 0.3  0.3 - 1.2 mg/dL   GFR calc non Af Amer 72 (*) >90 mL/min   GFR calc Af Amer 84 (*) >90 mL/min   Anion gap 13  5 - 15  PRO B NATRIURETIC PEPTIDE      Result Value Ref Range   Pro B Natriuretic peptide (BNP) 962.7 (*) 0 - 450 pg/mL  TROPONIN I      Result Value Ref Range   Troponin I <0.30  <0.30 ng/mL  URINALYSIS, ROUTINE W REFLEX MICROSCOPIC      Result Value Ref Range   Color, Urine YELLOW  YELLOW   APPearance CLEAR  CLEAR   Specific Gravity, Urine 1.009  1.005 - 1.030   pH 7.5  5.0 - 8.0   Glucose, UA NEGATIVE  NEGATIVE mg/dL   Hgb urine dipstick NEGATIVE  NEGATIVE   Bilirubin Urine NEGATIVE  NEGATIVE   Ketones, ur NEGATIVE  NEGATIVE mg/dL   Protein, ur NEGATIVE  NEGATIVE mg/dL   Urobilinogen, UA 0.2  0.0 - 1.0 mg/dL   Nitrite NEGATIVE  NEGATIVE   Leukocytes, UA MODERATE (*) NEGATIVE  URINE MICROSCOPIC-ADD ON      Result Value Ref Range   Squamous Epithelial / LPF FEW (*) RARE   WBC, UA 11-20  <3 WBC/hpf   Bacteria, UA FEW (*) RARE  POC OCCULT BLOOD, ED      Result Value Ref Range   Fecal Occult Bld NEGATIVE  NEGATIVE    Laboratory interpretation all normal except possible UTI, minor elevation of her BNP compared to baseline, new microcytic anemia (last hemoglobin was 11)    Imaging Review Dg Chest Portable 1 View  06/25/2014   CLINICAL DATA:  Shortness of breath.  EXAM: PORTABLE CHEST - 1 VIEW   COMPARISON:  02/04/2014.  FINDINGS: Mediastinum and hilar structures are normal. Mild cardiomegaly. Normal pulmonary vascularity. Previously identified changes of congestive heart failure on chest x-ray at  02/04/2014 have cleared. Previously identified nodular density right lung has cleared. Sliding hiatal hernia . No pleural effusion or pneumothorax. No acute bony abnormality. Postsurgical changes left shoulder.  IMPRESSION: 1. Cardiomegaly, no evidence of over congestive heart failure noted on today's exam. Previously identified changes of congestive heart failure on chest x-ray 02/04/2014 have cleared. 2. Nodular density right chest noted on prior chest x-ray has cleared. No acute abnormality. 3. Prominent sliding hiatal hernia.   Electronically Signed   By: Marcello Moores  Register   On: 06/25/2014 11:34     EKG Interpretation   #1  Date/Time:  Tuesday June 25 2014 09:32:42 EDT Ventricular Rate:  125 PR Interval:    QRS Duration: 106 QT Interval:  381 QTC Calculation: 549 R Axis:   -21 Text Interpretation:  Atrial fibrillation LVH with secondary  repolarization abnormality Anterior Q waves, possibly due to LVH Prolonged  QT interval Since last tracing 05 Feb 2014  Atrial fibrillation has  replaced Normal sinus rhythm Confirmed by Merriam Woods  MD-I, Ayat Drenning ([id number]) on  06/25/2014 10:39:29 AM    #2   Date: 06/25/2014  Rate: 64   Rhythm: normal sinus rhythm  QRS Axis: normal  Intervals: normal  ST/T Wave abnormalities: normal  Conduction Disutrbances:LVH  Narrative Interpretation:   Old EKG Reviewed: changes noted from earlier today when she was in afib with rapid ventricular response    MDM   Final diagnoses:  PAF (paroxysmal atrial fibrillation)  Urinary tract infection without hematuria, site unspecified  PRB (rectal bleeding)  Anemia, unspecified    Plan admission    Rolland Porter, MD, Abram Sander     Janice Norrie, MD 06/25/14 [id number]

## 2014-06-25 NOTE — H&P (Signed)
History and Physical  Cindy [name] YQM:[id number] DOB: Jun 13, 1922 DOA: 06/25/2014  Referring physician: Rolland Porter, ER Physician PCP: Elsie Stain, MD   Chief Complaint: SOB, weakness  HPI: Cindy [name] is a 80 y.o. female  With past medical history of paroxysmal atrial fibrillation and diastolic CHF who presented to the emergency room with several weeks of increasing weakness and in the last day started feeling more short of breath. She also had some vague complaints of abdominal pain and leg cramping, but apparently she has this a lot. She was evaluated and found to have a UTI. She was also noted to be in atrial fibrillation initially, but a followup EKG in the emergency room noted normal sinus rhythm. Patient does have a history paroxysmal A. fib. A BNP was checked and found to be mildly elevated at 962. Chest x-ray was unrevealing. The rest of patient's labs were unremarkable except for a hemoglobin of 9.9 with MCV of 75. Patient's hemoglobin in April was 11. When this was discussed the patient, she reported that she had a bright red bloody bowel movement a few days ago. She is unclear with what that was. She is noted to have some dried blood around her rectum on exam by the emergency room physician. Hospitalists were called for further evaluation   Review of Systems:  Patient seen after arrival to floor. Pt complains of feeling fatigued. She states her breathing is better..  Pt denies any headache, vision changes, dysphagia, chest pain, palpitations, acute shortness of breath, wheezing, cough, abdominal pain, hematuria, dysuria, constipation, diarrhea, focal extremity numbness or weakness or pain. Review systems is otherwise unremarkable.  Review of systems are otherwise negative  Past Medical History  Diagnosis Date  . Atrial fibrillation 04/23-24/2007    a. recurrent PAF with RVR in September 2013. b. Evaluated 03/2013, previously intolerant to Norpace and Amiodarone - consider  Multaq if recurs. c. Not on anticoag due to history of falls and also some internal bleeding per son.  . Hypertrophic cardiomyopathy   . Urinary incontinence   . Diverticulosis of colon (without mention of hemorrhage) 2003/ 08/2000    EGD/colonoscopy Barretts esophagus//H.H divertics 08/2000  . Cervical mass     C2 lateral mass fracture  . Hypertension   . Hypercholesterolemia     219/497  . Hypothyroidism   . Multinodular goiter (nontoxic)   . Osteoporosis   . Blood transfusion   . Jaundice ~ 1935    "in grade school"  . Degenerative joint disease     back  . Depression     "husband died Feb 08, 2011"  . Anemia, iron deficiency   . Personal history of colonic polyps 02/29/2012    tubular adenoma  . Barrett's esophagus   . CAD (coronary artery disease)     a. NSTEMI 03/2013: secondary to diagonal disease (small, not amenable to PCI, for med rx).  . Moderate mitral regurgitation 2014  . Mild aortic stenosis 2014  . Lung nodule seen on imaging study, pt does not wish further work up 02/08/2014  . Mitral valve regurgitation, mod to severe with moderately calcified annulus 02/08/2014  . Mitral valve mass, density - no fevers to suggest endocarditis 02/08/2014  . HOH (hard of hearing)   . Shortness of breath   . CHF (congestive heart failure)    Past Surgical History  Procedure Laterality Date  . Bladder surgery      bladder tack early 90's  . Tear duct probing  07/29/03  tear duct surg  . Cystourethroscopy  09/17/03  . Rotator cuff repair  ? date; 09/07/05    left; right( Dr. Gladstone Lighter)  . Appendectomy  1941  . Breast surgery  1981    breast reduction  . Eye surgery  03/2002    cataract OS  . Thyroid ultrasound  10/14/2003    MNG, no dominant masses  . Doppler echocardiography  03/05/2002&09/11/2003    ECHO, EF wnl, mild stenosis, A.S. mild MR, Mild T.R03/31/2003//ECHO EF 70%,LVH, ?diast dysfunction 09/11/2003  . Cataract extraction w/ intraocular lens  implant, bilateral  2003  .  Dilation and curettage of uterus  09/07/2000    endometrial polyps removed, path all benign   . Tonsillectomy and adenoidectomy      "as a child"  . Fracture surgery  2010    right knee   Social History:  reports that she quit smoking about 39 years ago. Her smoking use included Cigarettes. She has a 2 pack-year smoking history. She has never used smokeless tobacco. She reports that she does not drink alcohol or use illicit drugs. Patient lives at home by herself & is able to participate in activities of daily living with   Allergies  Allergen Reactions  . Lipitor [Atorvastatin] Other (See Comments)    LFT elevation, nausea  . Morphine And Related Other (See Comments)    "drives me crazy" and hyperactivity  . Irbesartan Swelling  . Ramipril Swelling    REACTION: lips swelling  . Telmisartan-Hctz Other (See Comments)    REACTION: incontinence  . Amiodarone        . Hydrochlorothiazide     hyponatremia  . Metoprolol     Headache, dizzy  . Tikosyn [Dofetilide]     Long QT  . Norpace [Disopyramide] Other (See Comments)    Dry mouth    Family History  Problem Relation Age of Onset  . Heart failure Mother     CHF, DM, HBP  . Hypertension Mother   . Uterine cancer Mother   . Stroke Mother   . Colon cancer Neg Hx   . Esophageal cancer Neg Hx   . Rectal cancer Neg Hx   . Stomach cancer Neg Hx     Reviewed with patient  Prior to Admission medications   Medication Sig Start Date End Date Taking? Authorizing Provider  aspirin 81 MG chewable tablet Chew 1 tablet (81 mg total) by mouth daily. 03/29/13  Yes Dayna N Dunn, PA-C  BESIVANCE 0.6 % SUSP Place 1 drop into both eyes 2 (two) times daily.  12/21/12  Yes Historical Provider, MD  diltiazem (CARTIA XT) 180 MG 24 hr capsule Take 1 capsule (180 mg total) by mouth daily. TAKE IN A.M 05/01/14  Yes Imogene Burn, PA-C  fish oil-omega-3 fatty acids 1000 MG capsule Take 1 g by mouth daily.    Yes Historical Provider, MD  furosemide  (LASIX) 20 MG tablet Take 0.5 tab a day.  Take and extra tab daily as needed for fluid (if weight gain >3lbs). 04/03/14  Yes Tonia Ghent, MD  HYDROcodone-acetaminophen (NORCO/VICODIN) 5-325 MG per tablet Take 0.5-1 tablets by mouth 2 (two) times daily as needed. 04/17/14  Yes Tonia Ghent, MD  levothyroxine (SYNTHROID, LEVOTHROID) 25 MCG tablet Take 25 mcg by mouth every morning.   Yes Historical Provider, MD  losartan (COZAAR) 50 MG tablet Take 50 mg by mouth daily. 02/08/14  Yes Cecilie Kicks, NP  Multiple Vitamins-Minerals (OCUVITE PRESERVISION) TABS Take 1 tablet  by mouth 2 (two) times daily.    Yes Historical Provider, MD  omeprazole (PRILOSEC) 40 MG capsule Take 40 mg by mouth daily.   Yes Historical Provider, MD  zolpidem (AMBIEN) 5 MG tablet Take 1 tablet (5 mg total) by mouth at bedtime as needed for sleep (please allow early refill). 04/25/14  Yes Tonia Ghent, MD    Physical Exam: BP 155/38  Pulse 67  Temp(Src) 97.6 F (36.4 C) (Oral)  Resp 20  Ht 5\' 3"  (1.6 m)  Wt 58.968 kg (130 lb)  BMI 23.03 kg/m2  SpO2 100%  General:  Alert and oriented x3, no acute distress Eyes: Sclera nonicteric, extraocular movements are intact ENT: Normocephalic, atraumatic, mucous membranes are slightly dry Neck: Supple, no JVD Cardiovascular: Regular rate and rhythm, S3-M1, 3/6 systolic ejection murmur Respiratory: Clear auscultation bilaterally Abdomen: Soft, obese, nontender, positive bowel sounds Skin: No skin breaks, tears or lesions Musculoskeletal: No clubbing or cyanosis, no pitting edema Psychiatric: Patient is appropriate, no evidence of psychoses Neurologic: No focal deficits           Labs on Admission:  Basic Metabolic Panel:  Recent Labs Lab 06/25/14 1056  NA 139  K 4.2  CL 100  CO2 26  GLUCOSE 94  BUN 12  CREATININE 0.76  CALCIUM 8.6   Liver Function Tests:  Recent Labs Lab 06/25/14 1056  AST 17  ALT 10  ALKPHOS 71  BILITOT 0.3  PROT 6.1  ALBUMIN 3.2*     No results found for this basename: LIPASE, AMYLASE,  in the last 168 hours No results found for this basename: AMMONIA,  in the last 168 hours CBC:  Recent Labs Lab 06/25/14 1056  WBC 5.3  NEUTROABS 3.9  HGB 9.9*  HCT 33.9*  MCV 75.8*  PLT 230   Cardiac Enzymes:  Recent Labs Lab 06/25/14 1059  TROPONINI <0.30    BNP (last 3 results)  Recent Labs  02/08/14 0342 02/25/14 1044 06/25/14 1059  PROBNP 768.0* 464.0* 962.7*   CBG: No results found for this basename: GLUCAP,  in the last 168 hours  Radiological Exams on Admission: Dg Chest Portable 1 View  06/25/2014   CLINICAL DATA: IMPRESSION: 1. Cardiomegaly, no evidence of over congestive heart failure noted on today's exam. Previously identified changes of congestive heart failure on chest x-ray 02/04/2014 have cleared. 2. Nodular density right chest noted on prior chest x-ray has cleared. No acute abnormality. 3. Prominent sliding hiatal hernia.   Electronically Signed   By: Marcello Moores  Register   On: 06/25/2014 11:34    EKG: Independently reviewed. Initial EKG emergency room notes atrial fibrillation with rapid ventricular rate  Assessment/Plan Present on Admission:  . PAF (paroxysmal atrial fibrillation): Rate controlled. Not on anticoagulation chronically.  Likely exacerbated by patient's mild diastolic heart failure  . Acute on chronic diastolic CHF (congestive heart failure): BNP mildly elevated. She could have some mild elevation from A. fib. We'll gently diurese. She is normally on 10 mg of Lasix every day. We'll put on 40 mg by mouth twice a day and check her strict output.  . Abdominal pain, other specified site: Instructed the patient currently she states her belly is not hurting. It was hurting earlier in the right lower [place] in the emergency room, but she states that happens every now and then  . HYPOTHYROIDISM: Continue home synthroid  . HYPERTENSION: Cont home meds  . CAD (coronary artery  disease)  . UTI (urinary tract infection): IV rocephin. Await  culture  Microcytic anemia: Patient does have a mild drop from her baseline. We'll heme test her stools. Recheck hemoglobin tomorrow. No obvious evidence of GI bleed. Could be diverticular. Could also just be simple straightforward iron deficiency anemia. Have ordered anemia panel.  Consultants: None  Code Status: DNR  Family Communication: Left message w/family   Disposition Plan: Observe overnight, could potentially go home tomorrow if fluid off  Time spent: 35 min  [facility] Pager [phone]  **Disclaimer: This note may have been dictated with voice recognition software. Similar sounding words can inadvertently be transcribed and this note may contain transcription errors which may not have been corrected upon publication of note.**

## 2014-06-25 NOTE — ED Notes (Signed)
Pt to department via EMS from home- pt reports that she has been increasingly SOB over the past couple of days. Denies any chest pain or n/v. No home O2. Bp-130/78 Hr-136 hx a-fib. 20g right forearm. 250 NS.

## 2014-06-26 DIAGNOSIS — D649 Anemia, unspecified: Secondary | ICD-10-CM

## 2014-06-26 DIAGNOSIS — I5033 Acute on chronic diastolic (congestive) heart failure: Secondary | ICD-10-CM

## 2014-06-26 DIAGNOSIS — I10 Essential (primary) hypertension: Secondary | ICD-10-CM

## 2014-06-26 DIAGNOSIS — I4891 Unspecified atrial fibrillation: Secondary | ICD-10-CM | POA: Diagnosis not present

## 2014-06-26 LAB — BASIC METABOLIC PANEL
Anion gap: 12 (ref 5–15)
BUN: 13 mg/dL (ref 6–23)
CALCIUM: 8.6 mg/dL (ref 8.4–10.5)
CO2: 26 mEq/L (ref 19–32)
Chloride: 100 mEq/L (ref 96–112)
Creatinine, Ser: 0.83 mg/dL (ref 0.50–1.10)
GFR calc Af Amer: 70 mL/min — ABNORMAL LOW (ref >90)
GFR calc non Af Amer: 60 mL/min — ABNORMAL LOW (ref >90)
Glucose, Bld: 93 mg/dL (ref 70–99)
Potassium: 3.9 mEq/L (ref 3.7–5.3)
SODIUM: 138 meq/L (ref 137–147)

## 2014-06-26 LAB — CBC
HCT: 33.2 % — ABNORMAL LOW (ref 36.0–46.0)
Hemoglobin: 9.5 g/dL — ABNORMAL LOW (ref 12.0–15.0)
MCH: 21.4 pg — ABNORMAL LOW (ref 26.0–34.0)
MCHC: 28.6 g/dL — ABNORMAL LOW (ref 30.0–36.0)
MCV: 74.9 fL — ABNORMAL LOW (ref 78.0–100.0)
PLATELETS: 236 10*3/uL (ref 150–400)
RBC: 4.43 MIL/uL (ref 3.87–5.11)
RDW: 15.9 % — ABNORMAL HIGH (ref 11.5–15.5)
WBC: 4.4 10*3/uL (ref 4.0–10.5)

## 2014-06-26 LAB — URINE CULTURE: Special Requests: NORMAL

## 2014-06-26 LAB — FERRITIN: Ferritin: 13 ng/mL (ref 10–291)

## 2014-06-26 LAB — VITAMIN B12: Vitamin B-12: 594 pg/mL (ref 211–911)

## 2014-06-26 MED ORDER — CIPROFLOXACIN HCL 250 MG PO TABS: 250.0000 mg | ORAL_TABLET | Freq: Two times a day (BID) | ORAL | Status: DC

## 2014-06-26 MED ORDER — FUROSEMIDE 20 MG PO TABS: 20.0000 mg | ORAL_TABLET | Freq: Every day | ORAL | Status: DC

## 2014-06-26 MED ORDER — CEFTRIAXONE SODIUM 1 G IJ SOLR
1.0000 g | INTRAMUSCULAR | Status: DC
  Filled 2014-06-26: qty 10

## 2014-06-26 MED ORDER — FERROUS SULFATE 325 (65 FE) MG PO TABS: 325.0000 mg | ORAL_TABLET | Freq: Every day | ORAL | Status: DC

## 2014-06-26 MED ORDER — DOCUSATE SODIUM 100 MG PO CAPS: 100.0000 mg | ORAL_CAPSULE | Freq: Two times a day (BID) | ORAL | Status: DC

## 2014-06-26 NOTE — Progress Notes (Signed)
Discharge education completed by RN. Pt and son received a copy of discharge paperwork and confirm understanding of follow up appointments and discharge medications. Both deny any questions at this time. IV removed, site is within normal limits. Pt will discharge from the unit via wheelchair. 

## 2014-06-26 NOTE — Discharge Summary (Signed)
Physician Discharge Summary  Cindy [name] CHE:[id number] DOB: 09/16/1922 DOA: 06/25/2014  PCP: Elsie Stain, MD  Admit date: 06/25/2014 Discharge date: 06/26/2014  Time spent: Less than 30 minutes  Recommendations for Outpatient Follow-up:  1. Dr. Phillip Heal Duncan/PCP in 1 week with repeat labs (CBC & BMP). Please follow final urine culture results sent from the hospital.  Discharge Diagnoses:  Principal Problem:   Acute on chronic diastolic CHF (congestive heart failure) Active Problems:   HYPOTHYROIDISM   HYPERTENSION   Abdominal pain, other specified site   CAD (coronary artery disease)   PAF (paroxysmal atrial fibrillation)   UTI (urinary tract infection)   Pulmonary hypertension   Acute diastolic CHF (congestive heart failure)   Microcytic anemia   Discharge Condition: Improved & Stable  Diet recommendation: Heart Healthy diet.  Filed Weights   06/25/14 0928 06/26/14 0249  Weight: 58.968 kg (130 lb) 58.605 kg (129 lb 3.2 oz)    History of present illness:  80 year old female patient with history of PAF, chronic diastolic CHF, hypertension, hypothyroid, hypercholesterolemia and CAD presented to the ED with several weeks complains of increasing weakness and on the day prior to admission feeling more dyspneic. She had vague complaints of abdominal pain and leg cramping which are chronic. In the ED, UA was suggestive of UTI and her BNP was elevated at 962. Chest x-ray was unremarkable. Hemoglobin was 9.9 with MCV of 75. She was admitted for further evaluation and management.  Hospital Course:   She was admitted on telemetry which showed PAF-rate controlled. Troponin x1 was negative. She was felt to have mild acute on chronic diastolic CHF. She was placed on slightly increased dose of oral Lasix with good diuresis. 2-D echo in March 2015 showed LVEF 53-61%, grade 2 diastolic dysfunction and mild aortic stenosis. Mobile density seen on mitral valve was not felt to be due to  endocarditis. She denied any further dyspnea, chest pain or palpitations. She denied any further abdominal pain, nausea or vomiting. She was placed on IV Rocephin which was transitioned to oral Cipro at discharge to treat for uncomplicated UTI. This can be further tailored based on final urine culture results. She has microcytic anemia consistent with iron deficiency which can be further evaluated as outpatient depending on how aggressive she wishes to be. She has been started on oral iron supplements. FOBT was negative. PT evaluated and did not have any home health recommendations.  Consultations:  None  Procedures:  None    Discharge Exam:  Complaints:  Patient states that she felt fine and denied dyspnea, chest pain or palpitations. Denied abdominal pain.  Filed Vitals:   06/25/14 2000 06/26/14 0000 06/26/14 0249 06/26/14 0400  BP: 153/80 129/38  137/37  Pulse: 64 59  57  Temp: 97.6 F (36.4 C) 97.5 F (36.4 C)  97.5 F (36.4 C)  TempSrc:  Oral  Oral  Resp: 18 18  18   Height:      Weight:   58.605 kg (129 lb 3.2 oz)   SpO2: 99% 96%  96%    General exam: Pleasant elderly female lying comfortably in bed. Respiratory system: Clear. No increased work of breathing. Cardiovascular system: S1 & S2 heard, irregularly irregular. No JVD, murmurs, gallops, clicks or pedal edema. Telemetry: Paroxysmal A. fib with CVR Gastrointestinal system: Abdomen is nondistended, soft and nontender. Normal bowel sounds heard. Central nervous system: Alert and oriented. No focal neurological deficits. Extremities: Symmetric 5 x 5 power.  Discharge Instructions  Discharge Instructions   (HEART FAILURE PATIENTS) Call MD:  Anytime you have any of the following symptoms: 1) 3 pound weight gain in 24 hours or 5 pounds in 1 week 2) shortness of breath, with or without a dry hacking cough 3) swelling in the hands, feet or stomach 4) if you have to sleep on extra pillows at night in order to breathe.     Complete by:  As directed      Call MD for:  difficulty breathing, headache or visual disturbances    Complete by:  As directed      Diet - low sodium heart healthy    Complete by:  As directed      Increase activity slowly    Complete by:  As directed             Medication List         aspirin 81 MG chewable tablet  Chew 1 tablet (81 mg total) by mouth daily.     BESIVANCE 0.6 % Susp  Generic drug:  Besifloxacin HCl  Place 1 drop into both eyes 2 (two) times daily.     ciprofloxacin 250 MG tablet  Commonly known as:  CIPRO  Take 1 tablet (250 mg total) by mouth 2 (two) times daily.     diltiazem 180 MG 24 hr capsule  Commonly known as:  CARTIA XT  Take 1 capsule (180 mg total) by mouth daily. TAKE IN A.M     docusate sodium 100 MG capsule  Commonly known as:  COLACE  Take 1 capsule (100 mg total) by mouth 2 (two) times daily.     ferrous sulfate 325 (65 FE) MG tablet  Take 1 tablet (325 mg total) by mouth daily with breakfast.     fish oil-omega-3 fatty acids 1000 MG capsule  Take 1 g by mouth daily.     furosemide 20 MG tablet  Commonly known as:  LASIX  Take 1 tablet (20 mg total) by mouth daily. Take and extra tab daily as needed for fluid (if weight gain >3lbs).     HYDROcodone-acetaminophen 5-325 MG per tablet  Commonly known as:  NORCO/VICODIN  Take 0.5-1 tablets by mouth 2 (two) times daily as needed.     levothyroxine 25 MCG tablet  Commonly known as:  SYNTHROID, LEVOTHROID  Take 25 mcg by mouth every morning.     losartan 50 MG tablet  Commonly known as:  COZAAR  Take 50 mg by mouth daily.     OCUVITE PRESERVISION Tabs  Take 1 tablet by mouth 2 (two) times daily.     omeprazole 40 MG capsule  Commonly known as:  PRILOSEC  Take 40 mg by mouth daily.     zolpidem 5 MG tablet  Commonly known as:  AMBIEN  Take 1 tablet (5 mg total) by mouth at bedtime as needed for sleep (please allow early refill).       Follow-up Information   Follow up  with Elsie Stain, MD. Schedule an appointment as soon as possible for a visit in 1 week. (To be seen with repeat labs (CBC & BMP).)    Specialty:  Family Medicine   Contact information:   [place] [address] [phone]        The results of significant diagnostics from this hospitalization (including imaging, microbiology, ancillary and laboratory) are listed below for reference.    Significant Diagnostic Studies: Dg Chest Portable 1 View  06/25/2014  CLINICAL DATA:  Shortness of breath.  EXAM: PORTABLE CHEST - 1 VIEW  COMPARISON:  02/04/2014.  FINDINGS: Mediastinum and hilar structures are normal. Mild cardiomegaly. Normal pulmonary vascularity. Previously identified changes of congestive heart failure on chest x-ray at 02/04/2014 have cleared. Previously identified nodular density right lung has cleared. Sliding hiatal hernia . No pleural effusion or pneumothorax. No acute bony abnormality. Postsurgical changes left shoulder.  IMPRESSION: 1. Cardiomegaly, no evidence of over congestive heart failure noted on today's exam. Previously identified changes of congestive heart failure on chest x-ray 02/04/2014 have cleared. 2. Nodular density right chest noted on prior chest x-ray has cleared. No acute abnormality. 3. Prominent sliding hiatal hernia.   Electronically Signed   By: Marcello Moores  Register   On: 06/25/2014 11:34    Microbiology: No results found for this or any previous visit (from the past 240 hour(s)).   Labs: Basic Metabolic Panel:  Recent Labs Lab 06/25/14 1056 06/26/14 0443  NA 139 138  K 4.2 3.9  CL 100 100  CO2 26 26  GLUCOSE 94 93  BUN 12 13  CREATININE 0.76 0.83  CALCIUM 8.6 8.6   Liver Function Tests:  Recent Labs Lab 06/25/14 1056  AST 17  ALT 10  ALKPHOS 71  BILITOT 0.3  PROT 6.1  ALBUMIN 3.2*   No results found for this basename: LIPASE, AMYLASE,  in the last 168 hours No results found for this basename: AMMONIA,  in the  last 168 hours CBC:  Recent Labs Lab 06/25/14 1056 06/26/14 0443  WBC 5.3 4.4  NEUTROABS 3.9  --   HGB 9.9* 9.5*  HCT 33.9* 33.2*  MCV 75.8* 74.9*  PLT 230 236   Cardiac Enzymes:  Recent Labs Lab 06/25/14 1059  TROPONINI <0.30   BNP: BNP (last 3 results)  Recent Labs  02/08/14 0342 02/25/14 1044 06/25/14 1059  PROBNP 768.0* 464.0* 962.7*   CBG: No results found for this basename: GLUCAP,  in the last 168 hours  Additional labs: 1. Anemia panel: Iron 19, TIBC 387, saturation ratios 5, ferritin 13, B12: 594, reticulocytes: 49.9 2. TSH: 1.250 3. FOBT: Negative   Signed:  Vernell Leep, MD, FACP, FHM. [facility] Pager [phone]  If 7PM-7AM, please contact night-coverage [email or web address] Password [facility] 06/26/2014, 12:58 PM

## 2014-06-26 NOTE — Care Management Note (Unsigned)
    Page 1 of 1   06/26/2014     11:15:48 AM CARE MANAGEMENT NOTE 06/26/2014  Patient:  [name],[name]   Account Number:  [id number]  Date Initiated:  06/26/2014  Documentation initiated by:  GRAVES-BIGELOW,Jakyron Fabro  Subjective/Objective Assessment:   Pt admitted for SOB, weakness. Pt initiated on IV Rocephin.     Action/Plan:   Pt is from home with family support. Will conitnue to monitor for disposition needs.   Anticipated DC Date:  06/27/2014   Anticipated DC Plan:  [place]  CM consult      Choice offered to / List presented to:             Status of service:  In process, will continue to follow Medicare Important Message given?   (If response is "NO", the following Medicare IM given date fields will be blank) Date Medicare IM given:   Medicare IM given by:   Date Additional Medicare IM given:   Additional Medicare IM given by:    Discharge Disposition:    Per UR Regulation:  Reviewed for med. necessity/level of care/duration of stay  If discussed at [place] of Stay Meetings, dates discussed:    Comments:

## 2014-06-26 NOTE — Evaluation (Signed)
Physical Therapy Evaluation Patient Details Name: Cindy [name] MRN: [id number] DOB: [birth year] Today's Date: 06/26/2014   History of Present Illness  Patient is a 80 y/o female admitted on 7/21 for SOB and weakness diagnosed with UTI and in A-fib initially, but a followup EKG in the emergency room noted normal sinus rhythm. PMH positive for paroxysmal A-fib, diastolic CHF, CAD, and HTN.   Clinical Impression  Patient presents with functional limitations due to deficits listed in PT problem list below. Pt functioning close to baseline with mild balance deficits and impaired cardiovascular endurance noted with increased distances. Pt has good family support. No LOB noted during gait training and functional activities as pt careful during mobility. Reluctant to give up independence. Educated provided to use rollator for safety at home. Pt would benefit from skilled PT while in hospital to maximize independence and improve safe mobility so pt can safely discharge to venue below.      Follow Up Recommendations No PT follow up;Supervision - Intermittent    Equipment Recommendations  None recommended by PT    Recommendations for Other Services       Precautions / Restrictions Precautions Precautions: Fall Restrictions Weight Bearing Restrictions: No      Mobility  Bed Mobility Overal bed mobility: Needs Assistance Bed Mobility: Supine to Sit     Supine to sit: Supervision;HOB elevated     General bed mobility comments: Use of rails for support.  Transfers Overall transfer level: Needs assistance Equipment used: None Transfers: Sit to/from Stand Sit to Stand: Supervision         General transfer comment: Stood x2 from EOB, ambulated to bathroom and transferred on/off toilet with grab bar.  Ambulation/Gait Ambulation/Gait assistance: Min guard Ambulation Distance (Feet): 200 Feet Assistive device: None Gait Pattern/deviations: Wide base of support;Drifts  right/left;Decreased stride length Gait velocity: 1.6 ft/sec. Gait velocity interpretation: <1.8 ft/sec, indicative of risk for recurrent falls General Gait Details: Mild unsteadiness initially however balance improved with increased distance. SOB present. 1 standing rest break half way during gait.   Stairs            Wheelchair Mobility    Modified Rankin (Stroke Patients Only)       Balance Overall balance assessment: Needs assistance   Sitting balance-Leahy Scale: Good       Standing balance-Leahy Scale: Fair Standing balance comment: Dynamic standing - toilet hygiene and washing hands at sink, no evidence of LOB. Supervision for activities within room.                             Pertinent Vitals/Pain SOB present during gait training. Vitals stable throughout. No pain reported.     Home Living Family/patient expects to be discharged to:: Private residence Living Arrangements: Alone Available Help at Discharge: Family;Available PRN/intermittently Type of Home: House Home Access: Stairs to enter;Ramped entrance Entrance Stairs-Rails: Right Entrance Stairs-Number of Steps: 2 Home Layout: One level Home Equipment: Walker - 4 wheels;Cane - single point;Shower seat;Wheelchair - manual      Prior Function Level of Independence: Independent         Comments: Pt reports an aide coming in a few hours each morning to assist with ADLs and some IADLs as needed. Pt ambulates (I) for household, uses w/c for long distances and occasionally uses rollator when feeling stiff. No falls in the last year. Family checks on her a few days/week.     Hand Dominance  Dominant Hand: Right    Extremity/Trunk Assessment   Upper Extremity Assessment: Overall WFL for tasks assessed           Lower Extremity Assessment: Overall WFL for tasks assessed         Communication   Communication: HOH  Cognition Arousal/Alertness: Awake/alert Behavior During  Therapy: WFL for tasks assessed/performed Overall Cognitive Status: Within Functional Limits for tasks assessed                      General Comments      Exercises General Exercises - Lower Extremity Ankle Circles/Pumps: Both;10 reps;Seated Hip Flexion/Marching: Both;10 reps;Seated      Assessment/Plan    PT Assessment Patient needs continued PT services  PT Diagnosis Generalized weakness;Difficulty walking   PT Problem List Cardiopulmonary status limiting activity;Decreased activity tolerance;Decreased balance;Decreased mobility;Decreased safety awareness  PT Treatment Interventions Balance training;Gait training;Functional mobility training;Therapeutic activities;Therapeutic exercise;Patient/family education   PT Goals (Current goals can be found in the Care Plan section) Acute Rehab PT Goals Patient Stated Goal: to go home PT Goal Formulation: With patient/family Time For Goal Achievement: 07/10/14 Potential to Achieve Goals: Good    Frequency Min 3X/week   Barriers to discharge        Co-evaluation               End of Session Equipment Utilized During Treatment: Gait belt Activity Tolerance: Patient tolerated treatment well Patient left: in chair;with family/visitor present;with call bell/phone within reach Nurse Communication: Mobility status    Functional Assessment Tool Used: Clinical judgment Functional Limitation: Mobility: Walking and moving around Mobility: Walking and Moving Around Current Status [phone]): At least 1 percent but less than 20 percent impaired, limited or restricted Mobility: Walking and Moving Around Goal Status [phone]): At least 1 percent but less than 20 percent impaired, limited or restricted    Time: 8416-6063 PT Time Calculation (min): 28 min   Charges:   PT Evaluation $Initial PT Evaluation Tier I: 1 Procedure PT Treatments $Therapeutic Activity: 8-22 mins   PT G Codes:   Functional Assessment Tool Used:  Clinical judgment Functional Limitation: Mobility: Walking and moving around    [place], [place] A 06/26/2014, 11:39 AM Candy Sledge, PT, DPT [phone]

## 2014-06-28 ENCOUNTER — Telehealth: Payer: Self-pay | Admitting: Family Medicine

## 2014-06-28 NOTE — Telephone Encounter (Signed)
Hospital follow up 07/01/14 Pt discharged from [facility] 06/26/14

## 2014-07-01 ENCOUNTER — Encounter: Payer: Self-pay | Admitting: Family Medicine

## 2014-07-01 ENCOUNTER — Ambulatory Visit (INDEPENDENT_AMBULATORY_CARE_PROVIDER_SITE_OTHER): Payer: Medicare Other | Admitting: Family Medicine

## 2014-07-01 VITALS — BP 172/60 | HR 74 | Temp 97.5°F | Wt 131.5 lb

## 2014-07-01 DIAGNOSIS — I2510 Atherosclerotic heart disease of native coronary artery without angina pectoris: Secondary | ICD-10-CM

## 2014-07-01 DIAGNOSIS — N390 Urinary tract infection, site not specified: Secondary | ICD-10-CM

## 2014-07-01 DIAGNOSIS — D509 Iron deficiency anemia, unspecified: Secondary | ICD-10-CM

## 2014-07-01 DIAGNOSIS — I10 Essential (primary) hypertension: Secondary | ICD-10-CM

## 2014-07-01 LAB — COMPREHENSIVE METABOLIC PANEL
ALK PHOS: 65 U/L (ref 39–117)
ALT: 13 U/L (ref 0–35)
AST: 19 U/L (ref 0–37)
Albumin: 3.6 g/dL (ref 3.5–5.2)
BUN: 14 mg/dL (ref 6–23)
CO2: 28 mEq/L (ref 19–32)
Calcium: 8.7 mg/dL (ref 8.4–10.5)
Chloride: 96 mEq/L (ref 96–112)
Creatinine, Ser: 0.8 mg/dL (ref 0.4–1.2)
GFR: 71.58 mL/min (ref >60.00)
Glucose, Bld: 100 mg/dL — ABNORMAL HIGH (ref 70–99)
Potassium: 3.8 mEq/L (ref 3.5–5.1)
SODIUM: 131 meq/L — AB (ref 135–145)
TOTAL PROTEIN: 6.3 g/dL (ref 6.0–8.3)
Total Bilirubin: 0.7 mg/dL (ref 0.2–1.2)

## 2014-07-01 LAB — CBC WITH DIFFERENTIAL/PLATELET
Basophils Absolute: 0 10*3/uL (ref 0.0–0.1)
Basophils Relative: 0.4 % (ref 0.0–3.0)
EOS PCT: 1 % (ref 0.0–5.0)
Eosinophils Absolute: 0.1 10*3/uL (ref 0.0–0.7)
HEMATOCRIT: 32.3 % — AB (ref 36.0–46.0)
HEMOGLOBIN: 10.1 g/dL — AB (ref 12.0–15.0)
LYMPHS ABS: 1.2 10*3/uL (ref 0.7–4.0)
Lymphocytes Relative: 21.3 % (ref 12.0–46.0)
MCHC: 31.1 g/dL (ref 30.0–36.0)
MCV: 71.4 fl — AB (ref 78.0–100.0)
MONO ABS: 0.8 10*3/uL (ref 0.1–1.0)
Monocytes Relative: 13.5 % — ABNORMAL HIGH (ref 3.0–12.0)
NEUTROS ABS: 3.7 10*3/uL (ref 1.4–7.7)
Neutrophils Relative %: 63.8 % (ref 43.0–77.0)
PLATELETS: 273 10*3/uL (ref 150.0–400.0)
RBC: 4.53 Mil/uL (ref 3.87–5.11)
RDW: 17.5 % — ABNORMAL HIGH (ref 11.5–15.5)
WBC: 5.7 10*3/uL (ref 4.0–10.5)

## 2014-07-01 NOTE — Assessment & Plan Note (Signed)
Presumed, resolved by hx. No need to recheck ucx.  Prev ucx reviewed.

## 2014-07-01 NOTE — Assessment & Plan Note (Signed)
Known GI blood loss per patient report.  She'll consider how aggressive she wants to be.  See notes on labs. >25 minutes spent in face to face time with patient, >50% spent in counselling or coordination of care.

## 2014-07-01 NOTE — Patient Instructions (Addendum)
High iron foods include: Red meat.  Pork.  Poultry.  Seafood.  Beans.  Dark green leafy vegetables, such as spinach.  Dried fruit, such as raisins and apricots.  Iron-fortified cereals, breads and pastas.  Try taking OTC iron 325mg  every other day.   You told me you would like to avoid testing and invasive measures.  Please consider this in detail, to provide [place] some guidance. For example, would you want transfusion if needed?  If your weight is up to 134 or higher, then notify cardiology or [place].    Go to the lab on the way out.  We'll contact you with your lab report.  Take care.

## 2014-07-01 NOTE — Assessment & Plan Note (Signed)
With CHF hx.  D/w pt re: iron def anemia and relation to CHF. Fluid status is okay for now.  Continue current meds.  See notes on labs.

## 2014-07-01 NOTE — Progress Notes (Signed)
Pre visit review using our clinic review tool, if applicable. No additional management support is needed unless otherwise documented below in the visit note.  She was admitted with SOB and likely CHF exacerbation, possible UTI.  No burning with urination now.  She isn't SOB at rest, doesn't report SOB with exertion. She is improved from admission. She has been weighing daily, usually ~130.  She hadn't taking her BP meds today but her BP had been controlled at home.  Recheck BP 140/60.   IDA noted, not on iron.  D/w pt.  Asked her to try qod dosig. FOBT neg.  She reports blood in stool, BRBPR a few days before admission. She never saw it again.  No abd pain now, but had some lower abd pain.  Resolved now.  She would like to avoid testing and invasive measures.  I asked her to consider this in detail, to provide [place] some guidance.   She is intermittently supervised, throughout the day.  She has an emergency button to press, in case of falls. She didn't qualify for [facility] PT.   Her back is sore since getting home from the hospital, likely from the bed arrangement at the hospital.    Meds, vitals, and allergies reviewed.   ROS: See HPI.  Otherwise, noncontributory.  nad ncat Mmm Neck supple. No LA Skin tear on L forearm, about 3cm skin folded down, covered in clinic.   IRR, murmur noted.  ctab abd soft, not ttp Ext w/o edema

## 2014-07-04 ENCOUNTER — Encounter: Payer: Self-pay | Admitting: *Deleted

## 2014-07-09 ENCOUNTER — Telehealth: Payer: Self-pay | Admitting: Family Medicine

## 2014-07-09 NOTE — Telephone Encounter (Signed)
[place] advised.  °

## 2014-07-09 NOTE — Telephone Encounter (Signed)
I would take an extra lasix today.  Notify us tomorrow if weight isn't coming down to 134 or lower.  Thanks.

## 2014-07-09 NOTE — Telephone Encounter (Signed)
[place] called stating when pt weight got over 134 to let you know. Cindy [name] weight was 135 this morning at 9:18 am.

## 2014-07-11 ENCOUNTER — Other Ambulatory Visit: Payer: Self-pay | Admitting: Gastroenterology

## 2014-07-15 ENCOUNTER — Ambulatory Visit: Payer: Medicare Other | Admitting: Family Medicine

## 2014-07-18 ENCOUNTER — Other Ambulatory Visit: Payer: Self-pay

## 2014-07-18 MED ORDER — ZOLPIDEM TARTRATE 5 MG PO TABS: 5.0000 mg | ORAL_TABLET | Freq: Every evening | ORAL | Status: DC | PRN

## 2014-07-18 NOTE — Telephone Encounter (Signed)
Pt left form requesting [facility].Please advise. Form given to [place].

## 2014-07-18 NOTE — Telephone Encounter (Signed)
Printed.  Thanks.  

## 2014-07-19 ENCOUNTER — Other Ambulatory Visit: Payer: Self-pay | Admitting: *Deleted

## 2014-07-19 NOTE — Telephone Encounter (Signed)
Faxed

## 2014-07-29 ENCOUNTER — Other Ambulatory Visit: Payer: Self-pay | Admitting: *Deleted

## 2014-08-15 ENCOUNTER — Ambulatory Visit (INDEPENDENT_AMBULATORY_CARE_PROVIDER_SITE_OTHER): Payer: Medicare Other | Admitting: Cardiology

## 2014-08-15 ENCOUNTER — Encounter: Payer: Self-pay | Admitting: Cardiology

## 2014-08-15 VITALS — HR 108 | Ht <= 58 in | Wt 135.0 lb

## 2014-08-15 DIAGNOSIS — I059 Rheumatic mitral valve disease, unspecified: Secondary | ICD-10-CM

## 2014-08-15 DIAGNOSIS — I5032 Chronic diastolic (congestive) heart failure: Secondary | ICD-10-CM

## 2014-08-15 DIAGNOSIS — I4819 Other persistent atrial fibrillation: Secondary | ICD-10-CM

## 2014-08-15 DIAGNOSIS — I2510 Atherosclerotic heart disease of native coronary artery without angina pectoris: Secondary | ICD-10-CM

## 2014-08-15 DIAGNOSIS — I4891 Unspecified atrial fibrillation: Secondary | ICD-10-CM

## 2014-08-15 DIAGNOSIS — I509 Heart failure, unspecified: Secondary | ICD-10-CM

## 2014-08-15 DIAGNOSIS — I421 Obstructive hypertrophic cardiomyopathy: Secondary | ICD-10-CM

## 2014-08-15 DIAGNOSIS — I340 Nonrheumatic mitral (valve) insufficiency: Secondary | ICD-10-CM

## 2014-08-15 DIAGNOSIS — I10 Essential (primary) hypertension: Secondary | ICD-10-CM

## 2014-08-15 MED ORDER — DILTIAZEM HCL ER COATED BEADS 120 MG PO CP24: 120.0000 mg | ORAL_CAPSULE | Freq: Every day | ORAL | Status: DC

## 2014-08-15 NOTE — Progress Notes (Signed)
Cindy [name] Date of Birth: [birth year] Medical Record #[id number]  History of Present Illness: [name] is seen back today for a follow up visit. She is a  80 year old female with HOCM, CAD with past NSTEMI in 03/2013 in the setting of rapid AF - has high grade stenosis in the first diagonal branch (moderate vessel in length but small in caliber) and a 70% stenosis in the 2nd DX - she was managed medically, PAF, diastolic HF, valvular heart disease with MR and advanced age. EF is normal by echo from April of 2014 but with grade II diastolic dysfunction, mild AS, mild MR and mild MS. She is intolerant to amiodarone and not a candidate for Tikosyn due to prolonged QT - not a candidate for anticoagulation given past history of bleeding and falls.   Admitted back in March with an acute onset of shortness of breath and hypoxia. Treated with oxygen, CPAP and diuresis. Echo updated - moderate to severe MR with a mobile density on the mitral valve - no fevers and not felt to be endocarditis - she did not wish to pursue cardiac surgery.   On follow up with Dr. Damita Dunnings she had her HCTZ stopped due to hyponatremia and placed on lasix. He started low dose beta blocker. She ended up not tolerating this - was dizzy and "horribly sick". Subsequently place on diltiazem. When seen by Dr. Damita Dunnings in May HR 117. Diltiazem increased to 180 mg bid. This was later reduced to 180 mg in the am and 120 mg in the pm due to side effects.  She is seen for follow up today. She is seen with her son. She states she has really felt bad for the past 2 weeks. Complains of palpitations. Has SOB and low energy. Legs cramp and are restless. She complains of itching. She has been monitoring weight and it has been stable. She rarely takes extra Lasix.    Current Outpatient Prescriptions  Medication Sig Dispense Refill  . aspirin 81 MG chewable tablet Chew 1 tablet (81 mg total) by mouth daily.      Marland Kitchen BESIVANCE 0.6 % SUSP Place 1 drop  into both eyes 2 (two) times daily.       Marland Kitchen diltiazem (CARTIA XT) 180 MG 24 hr capsule Take 1 capsule (180 mg total) by mouth daily. TAKE IN A.M  30 capsule  3  . docusate sodium (COLACE) 100 MG capsule Take 1 capsule (100 mg total) by mouth 2 (two) times daily.  60 capsule  0  . fish oil-omega-3 fatty acids 1000 MG capsule Take 1 g by mouth daily.       . furosemide (LASIX) 20 MG tablet Take 20 mg by mouth daily. Take an extra tab daily as needed for fluid (if weight gain >3lbs).      Marland Kitchen HYDROcodone-acetaminophen (NORCO/VICODIN) 5-325 MG per tablet Take 0.5-1 tablets by mouth 2 (two) times daily as needed.  90 tablet  0  . levothyroxine (SYNTHROID, LEVOTHROID) 25 MCG tablet Take 25 mcg by mouth every morning.      Marland Kitchen losartan (COZAAR) 50 MG tablet Take 50 mg by mouth daily.      . Multiple Vitamins-Minerals (OCUVITE PRESERVISION) TABS Take 1 tablet by mouth 2 (two) times daily.       Marland Kitchen omeprazole (PRILOSEC) 40 MG capsule Take 40 mg by mouth daily.      Marland Kitchen omeprazole (PRILOSEC) 40 MG capsule TAKE 1 CAPSULE DAILY  90 capsule  2  .  zolpidem (AMBIEN) 5 MG tablet Take 1 tablet (5 mg total) by mouth at bedtime as needed for sleep.  90 tablet  1  . diltiazem (CARDIZEM CD) 120 MG 24 hr capsule Take 1 capsule (120 mg total) by mouth daily. Take one tablet daily at noon       No current facility-administered medications for this visit.    Allergies  Allergen Reactions  . Lipitor [Atorvastatin] Other (See Comments)    LFT elevation, nausea  . Morphine And Related Other (See Comments)    "drives me crazy" and hyperactivity  . Irbesartan Swelling  . Ramipril Swelling    REACTION: lips swelling  . Telmisartan-Hctz Other (See Comments)    REACTION: incontinence  . Amiodarone        . Hydrochlorothiazide     hyponatremia  . Metoprolol     Headache, dizzy  . Tikosyn [Dofetilide]     Long QT  . Norpace [Disopyramide] Other (See Comments)    Dry mouth    Past Medical History  Diagnosis Date  .  Atrial fibrillation 04/23-24/2007    a. recurrent PAF with RVR in September 2013. b. Evaluated 03/2013, previously intolerant to Norpace and Amiodarone - consider Multaq if recurs. c. Not on anticoag due to history of falls and also some internal bleeding per son.  . Hypertrophic cardiomyopathy   . Urinary incontinence   . Diverticulosis of colon (without mention of hemorrhage) 2003/ 08/2000    EGD/colonoscopy Barretts esophagus//H.H divertics 08/2000  . Cervical mass     C2 lateral mass fracture  . Hypertension   . Hypercholesterolemia     219/497  . Hypothyroidism   . Multinodular goiter (nontoxic)   . Osteoporosis   . Blood transfusion   . Jaundice ~ 1935    "in grade school"  . Degenerative joint disease     back  . Depression     "husband died 2011-02-17"  . Anemia, iron deficiency   . Personal history of colonic polyps 02/29/2012    tubular adenoma  . Barrett's esophagus   . CAD (coronary artery disease)     a. NSTEMI 03/2013: secondary to diagonal disease (small, not amenable to PCI, for med rx).  . Moderate mitral regurgitation 2014  . Mild aortic stenosis 2014  . Lung nodule seen on imaging study, pt does not wish further work up 02/08/2014  . Mitral valve regurgitation, mod to severe with moderately calcified annulus 02/08/2014  . Mitral valve mass, density - no fevers to suggest endocarditis 02/08/2014  . HOH (hard of hearing)   . Shortness of breath   . CHF (congestive heart failure)     Past Surgical History  Procedure Laterality Date  . Bladder surgery      bladder tack early 90's  . Tear duct probing  07/29/03    tear duct surg  . Cystourethroscopy  09/17/03  . Rotator cuff repair  ? date; 09/07/05    left; right( Dr. Gladstone Lighter)  . Appendectomy  1941  . Breast surgery  1981    breast reduction  . Eye surgery  03/2002    cataract OS  . Thyroid ultrasound  10/14/2003    MNG, no dominant masses  . Doppler echocardiography  03/05/2002&09/11/2003    ECHO, EF wnl, mild  stenosis, A.S. mild MR, Mild T.R03/31/2003//ECHO EF 70%,LVH, ?diast dysfunction 09/11/2003  . Cataract extraction w/ intraocular lens  implant, bilateral  2003  . Dilation and curettage of uterus  09/07/2000    endometrial  polyps removed, path all benign   . Tonsillectomy and adenoidectomy      "as a child"  . Fracture surgery  2010    right knee    History  Smoking status  . Former Smoker -- 0.50 packs/day for 4 years  . Types: Cigarettes  . Quit date: 07/06/1974  Smokeless tobacco  . Never Used    History  Alcohol Use No    Family History  Problem Relation Age of Onset  . Heart failure Mother     CHF, DM, HBP  . Hypertension Mother   . Uterine cancer Mother   . Stroke Mother   . Colon cancer Neg Hx   . Esophageal cancer Neg Hx   . Rectal cancer Neg Hx   . Stomach cancer Neg Hx     Review of Systems: The review of systems is per the HPI.  All other systems were reviewed and are negative.  Physical Exam: Pulse 108  Ht 4\' 10"  (1.473 m)  Wt 135 lb (61.236 kg)  BMI 28.22 kg/m2 Pulse 92 bpm by my exam. Patient is a pleasant, elderly WF who appears chronically ill.  Skin is warm and dry. Color is normal.  HEENT is unremarkable. Normocephalic/atraumatic. PERRL. Sclera are nonicteric. Neck is supple. No masses. No JVD. Lungs diffusly diminished BS with minimal basilar rales Cardiac exam shows an irregular rate and rhythm. Harsh 2/6 outflow murmur noted. Abdomen is soft. Extremities are without edema.  No gross neurologic deficits noted.  Wt Readings from Last 3 Encounters:  08/15/14 135 lb (61.236 kg)  07/01/14 131 lb 8 oz (59.648 kg)  06/26/14 129 lb 3.2 oz (58.605 kg)     LABORATORY DATA:    Lab Results  Component Value Date   WBC 5.7 07/01/2014   HGB 10.1* 07/01/2014   HCT 32.3* 07/01/2014   PLT 273.0 07/01/2014   GLUCOSE 100* 07/01/2014   CHOL 160 08/30/2012   TRIG 77 08/30/2012   HDL 62 08/30/2012   LDLDIRECT 132.8 08/16/2007   LDLCALC 83 08/30/2012   ALT 13  07/01/2014   AST 19 07/01/2014   NA 131* 07/01/2014   K 3.8 07/01/2014   CL 96 07/01/2014   CREATININE 0.8 07/01/2014   BUN 14 07/01/2014   CO2 28 07/01/2014   TSH 1.250 06/25/2014   INR 0.92 08/13/2013   MICROALBUR 0.4 02/11/2010   Echo Study Conclusions from March 2015  - Left ventricle: The cavity size was normal. There was moderate concentric hypertrophy. Systolic function was normal. The estimated ejection fraction was in the range of 60% to 65%. Wall motion was normal; there were no regional wall motion abnormalities. Features are consistent with a pseudonormal left ventricular filling pattern, with concomitant abnormal relaxation and increased filling pressure (grade 2 diastolic dysfunction). - Aortic valve: Severe thickening and calcification. There was mild stenosis. Trivial regurgitation. Valve area: 1.18cm^2(VTI). Valve area: 0.96cm^2 (Vmax). - Mitral valve: There appears to be a mobile density off of the anterior MV leaflet that is only appreciated in the apical views and may represent a vegetation. Recommend TEE for further evaluation as well as determine the etiology of significant MR Moderately calcified annulus. Moderate to severe regurgitation directed eccentrically and toward the septum. Valve area by continuity equation (using LVOT flow): 1.53cm^2. - Left atrium: The atrium was mildly dilated. - Right ventricle: The cavity size was mildly dilated. - Tricuspid valve: Moderate regurgitation. - Pulmonary arteries: PA peak pressure: 2mm Hg (S).  Ecg: Atrial fibrillation with rate  108 bpm. LVH with repolarization abnormality. No change from 06/25/14  Assessment / Plan:  1. Valvular heart disease -mod/severe MR, mild AS- to manage conservatively.   2. Chronic diastolic CHF. Weight is stable. No edema. Mild rales. On low dose lasix. Will continue current therapy.  3. CAD - managed medically. No chest pain reported.   4. Atrial fibrillation now persistent - Has been  quite intolerant to lots of medicines, now to include metoprolol. Will keep her on her current regimen. Not a candidate for anticoagulation. Rate control appears adequate on current diltiazem dose.  5. Advanced age   I think she is on optimal therapy from a cardiac standpoint. Still quite symptomatic with her Afib, CHF and MR. Little more that we can offer at this point. I will follow up in 3 months.

## 2014-08-15 NOTE — Patient Instructions (Signed)
Continue your current therapy   I will see you in 3 months. 

## 2014-08-19 ENCOUNTER — Encounter (HOSPITAL_COMMUNITY): Payer: Self-pay | Admitting: Emergency Medicine

## 2014-08-19 ENCOUNTER — Inpatient Hospital Stay (HOSPITAL_COMMUNITY)
Admission: EM | Admit: 2014-08-19 | Discharge: 2014-08-20 | DRG: 308 | Disposition: A | Discharge status: Home / Self Care | Payer: Medicare Other | Attending: Internal Medicine | Admitting: Internal Medicine

## 2014-08-19 ENCOUNTER — Encounter (INDEPENDENT_AMBULATORY_CARE_PROVIDER_SITE_OTHER): Payer: PRIVATE HEALTH INSURANCE | Admitting: Ophthalmology

## 2014-08-19 ENCOUNTER — Emergency Department (HOSPITAL_COMMUNITY): Payer: Medicare Other

## 2014-08-19 DIAGNOSIS — E875 Hyperkalemia: Secondary | ICD-10-CM | POA: Diagnosis present

## 2014-08-19 DIAGNOSIS — E7800 Pure hypercholesterolemia, unspecified: Secondary | ICD-10-CM | POA: Diagnosis present

## 2014-08-19 DIAGNOSIS — I509 Heart failure, unspecified: Secondary | ICD-10-CM | POA: Diagnosis present

## 2014-08-19 DIAGNOSIS — I080 Rheumatic disorders of both mitral and aortic valves: Secondary | ICD-10-CM | POA: Diagnosis present

## 2014-08-19 DIAGNOSIS — E871 Hypo-osmolality and hyponatremia: Secondary | ICD-10-CM | POA: Diagnosis present

## 2014-08-19 DIAGNOSIS — R531 Weakness: Secondary | ICD-10-CM

## 2014-08-19 DIAGNOSIS — I10 Essential (primary) hypertension: Secondary | ICD-10-CM | POA: Diagnosis present

## 2014-08-19 DIAGNOSIS — I252 Old myocardial infarction: Secondary | ICD-10-CM

## 2014-08-19 DIAGNOSIS — I5032 Chronic diastolic (congestive) heart failure: Secondary | ICD-10-CM | POA: Diagnosis present

## 2014-08-19 DIAGNOSIS — Z7982 Long term (current) use of aspirin: Secondary | ICD-10-CM

## 2014-08-19 DIAGNOSIS — Z961 Presence of intraocular lens: Secondary | ICD-10-CM

## 2014-08-19 DIAGNOSIS — F3289 Other specified depressive episodes: Secondary | ICD-10-CM | POA: Diagnosis present

## 2014-08-19 DIAGNOSIS — R5381 Other malaise: Secondary | ICD-10-CM

## 2014-08-19 DIAGNOSIS — I482 Chronic atrial fibrillation, unspecified: Secondary | ICD-10-CM

## 2014-08-19 DIAGNOSIS — Z87891 Personal history of nicotine dependence: Secondary | ICD-10-CM

## 2014-08-19 DIAGNOSIS — I5033 Acute on chronic diastolic (congestive) heart failure: Secondary | ICD-10-CM | POA: Diagnosis present

## 2014-08-19 DIAGNOSIS — I340 Nonrheumatic mitral (valve) insufficiency: Secondary | ICD-10-CM

## 2014-08-19 DIAGNOSIS — H9190 Unspecified hearing loss, unspecified ear: Secondary | ICD-10-CM | POA: Diagnosis present

## 2014-08-19 DIAGNOSIS — F32A Depression, unspecified: Secondary | ICD-10-CM

## 2014-08-19 DIAGNOSIS — Z79899 Other long term (current) drug therapy: Secondary | ICD-10-CM

## 2014-08-19 DIAGNOSIS — Z9849 Cataract extraction status, unspecified eye: Secondary | ICD-10-CM

## 2014-08-19 DIAGNOSIS — E042 Nontoxic multinodular goiter: Secondary | ICD-10-CM | POA: Diagnosis present

## 2014-08-19 DIAGNOSIS — Z66 Do not resuscitate: Secondary | ICD-10-CM | POA: Diagnosis present

## 2014-08-19 DIAGNOSIS — I421 Obstructive hypertrophic cardiomyopathy: Secondary | ICD-10-CM | POA: Diagnosis present

## 2014-08-19 DIAGNOSIS — R5383 Other fatigue: Secondary | ICD-10-CM

## 2014-08-19 DIAGNOSIS — F329 Major depressive disorder, single episode, unspecified: Secondary | ICD-10-CM

## 2014-08-19 DIAGNOSIS — I2510 Atherosclerotic heart disease of native coronary artery without angina pectoris: Secondary | ICD-10-CM | POA: Diagnosis present

## 2014-08-19 DIAGNOSIS — I4891 Unspecified atrial fibrillation: Secondary | ICD-10-CM | POA: Diagnosis not present

## 2014-08-19 DIAGNOSIS — I480 Paroxysmal atrial fibrillation: Secondary | ICD-10-CM | POA: Diagnosis present

## 2014-08-19 DIAGNOSIS — E039 Hypothyroidism, unspecified: Secondary | ICD-10-CM | POA: Diagnosis present

## 2014-08-19 DIAGNOSIS — I058 Other rheumatic mitral valve diseases: Secondary | ICD-10-CM

## 2014-08-19 LAB — CBC
HCT: 33.5 % — ABNORMAL LOW (ref 36.0–46.0)
HEMOGLOBIN: 9.8 g/dL — AB (ref 12.0–15.0)
MCH: 21.9 pg — AB (ref 26.0–34.0)
MCHC: 29.3 g/dL — ABNORMAL LOW (ref 30.0–36.0)
MCV: 74.9 fL — AB (ref 78.0–100.0)
PLATELETS: 238 10*3/uL (ref 150–400)
RBC: 4.47 MIL/uL (ref 3.87–5.11)
RDW: 17.8 % — ABNORMAL HIGH (ref 11.5–15.5)
WBC: 6.1 10*3/uL (ref 4.0–10.5)

## 2014-08-19 LAB — TSH: TSH: 1.46 u[IU]/mL (ref 0.350–4.500)

## 2014-08-19 LAB — BASIC METABOLIC PANEL
ANION GAP: 5 (ref 5–15)
BUN: 11 mg/dL (ref 6–23)
CALCIUM: 9.3 mg/dL (ref 8.4–10.5)
CHLORIDE: 96 meq/L (ref 96–112)
CO2: 25 meq/L (ref 19–32)
Creatinine, Ser: 0.51 mg/dL (ref 0.50–1.10)
GFR calc Af Amer: >90 mL/min (ref >90)
GFR calc non Af Amer: 82 mL/min — ABNORMAL LOW (ref >90)
Glucose, Bld: 135 mg/dL — ABNORMAL HIGH (ref 70–99)
Potassium: 5.5 mEq/L — ABNORMAL HIGH (ref 3.7–5.3)
SODIUM: 126 meq/L — AB (ref 137–147)

## 2014-08-19 LAB — MAGNESIUM: Magnesium: 2.1 mg/dL (ref 1.5–2.5)

## 2014-08-19 LAB — PRO B NATRIURETIC PEPTIDE: Pro B Natriuretic peptide (BNP): 1807 pg/mL — ABNORMAL HIGH (ref 0–450)

## 2014-08-19 LAB — TROPONIN I: Troponin I: <0.3 ng/mL (ref <0.30)

## 2014-08-19 MED ORDER — DILTIAZEM HCL 100 MG IV SOLR
5.0000 mg/h | INTRAVENOUS | Status: DC
  Administered 2014-08-19: 5 mg/h via INTRAVENOUS

## 2014-08-19 MED ORDER — ASPIRIN EC 81 MG PO TBEC
81.0000 mg | DELAYED_RELEASE_TABLET | Freq: Every day | ORAL | Status: DC
  Administered 2014-08-20: 81 mg via ORAL
  Filled 2014-08-19: qty 1

## 2014-08-19 MED ORDER — DILTIAZEM HCL 100 MG IV SOLR: 5.0000 mg/h | INTRAVENOUS | Status: DC

## 2014-08-19 MED ORDER — ENOXAPARIN SODIUM 30 MG/0.3ML ~~LOC~~ SOLN
30.0000 mg | SUBCUTANEOUS | Status: DC
  Administered 2014-08-20: 30 mg via SUBCUTANEOUS
  Filled 2014-08-19: qty 0.3

## 2014-08-19 MED ORDER — DOCUSATE SODIUM 100 MG PO CAPS
100.0000 mg | ORAL_CAPSULE | Freq: Two times a day (BID) | ORAL | Status: DC
  Administered 2014-08-19 – 2014-08-20 (×2): 100 mg via ORAL
  Filled 2014-08-19 (×3): qty 1

## 2014-08-19 MED ORDER — DILTIAZEM LOAD VIA INFUSION
10.0000 mg | Freq: Once | INTRAVENOUS | Status: AC
  Administered 2014-08-19: 10 mg via INTRAVENOUS
  Filled 2014-08-19: qty 10

## 2014-08-19 MED ORDER — ACETAMINOPHEN 325 MG PO TABS
650.0000 mg | ORAL_TABLET | Freq: Four times a day (QID) | ORAL | Status: DC | PRN
  Administered 2014-08-19 – 2014-08-20 (×2): 650 mg via ORAL
  Filled 2014-08-19 (×2): qty 2

## 2014-08-19 MED ORDER — DILTIAZEM HCL ER COATED BEADS 180 MG PO CP24
180.0000 mg | ORAL_CAPSULE | ORAL | Status: DC
  Administered 2014-08-20: 180 mg via ORAL
  Filled 2014-08-19 (×2): qty 1

## 2014-08-19 MED ORDER — SODIUM CHLORIDE 0.9 % IJ SOLN
3.0000 mL | Freq: Two times a day (BID) | INTRAMUSCULAR | Status: DC
  Administered 2014-08-19 – 2014-08-20 (×2): 3 mL via INTRAVENOUS

## 2014-08-19 MED ORDER — FUROSEMIDE 10 MG/ML IJ SOLN
40.0000 mg | Freq: Once | INTRAMUSCULAR | Status: AC
  Administered 2014-08-19: 40 mg via INTRAVENOUS
  Filled 2014-08-19: qty 4

## 2014-08-19 MED ORDER — PANTOPRAZOLE SODIUM 40 MG PO TBEC
40.0000 mg | DELAYED_RELEASE_TABLET | Freq: Every day | ORAL | Status: DC
  Administered 2014-08-20: 40 mg via ORAL
  Filled 2014-08-19: qty 1

## 2014-08-19 MED ORDER — DILTIAZEM HCL ER COATED BEADS 120 MG PO CP24
120.0000 mg | ORAL_CAPSULE | Freq: Every day | ORAL | Status: DC
  Filled 2014-08-19: qty 1

## 2014-08-19 MED ORDER — LEVOTHYROXINE SODIUM 25 MCG PO TABS
25.0000 ug | ORAL_TABLET | Freq: Every day | ORAL | Status: DC
  Administered 2014-08-20: 25 ug via ORAL
  Filled 2014-08-19 (×2): qty 1

## 2014-08-19 NOTE — ED Provider Notes (Signed)
CSN: [id number]     Arrival date & time 08/19/14  1043 History   First MD Initiated Contact with Patient 08/19/14 1045     Chief Complaint  Patient presents with  . Atrial Fibrillation     (Consider location/radiation/quality/duration/timing/severity/associated sxs/prior Treatment) HPI  Patient presents with concern generalized weakness. Symptoms began sometime ago, progressed over the past days. There is associated palpitations, but no chest pain. No syncope, the patient is lightheaded. No nausea, vomiting, diarrhea, urinary changes. Patient is on medication as directed.   Past Medical History  Diagnosis Date  . Atrial fibrillation 04/23-24/2007    a. recurrent PAF with RVR in September 2013. b. Evaluated 03/2013, previously intolerant to Norpace and Amiodarone - consider Multaq if recurs. c. Not on anticoag due to history of falls and also some internal bleeding per son.  . Hypertrophic cardiomyopathy   . Urinary incontinence   . Diverticulosis of colon (without mention of hemorrhage) 2003/ 08/2000    EGD/colonoscopy Barretts esophagus//H.H divertics 08/2000  . Cervical mass     C2 lateral mass fracture  . Hypertension   . Hypercholesterolemia     219/497  . Hypothyroidism   . Multinodular goiter (nontoxic)   . Osteoporosis   . Blood transfusion   . Jaundice ~ 1935    "in grade school"  . Degenerative joint disease     back  . Depression     "husband died 02-17-2011"  . Anemia, iron deficiency   . Personal history of colonic polyps 02/29/2012    tubular adenoma  . Barrett's esophagus   . CAD (coronary artery disease)     a. NSTEMI 03/2013: secondary to diagonal disease (small, not amenable to PCI, for med rx).  . Moderate mitral regurgitation 2014  . Mild aortic stenosis 2014  . Lung nodule seen on imaging study, pt does not wish further work up 02/08/2014  . Mitral valve regurgitation, mod to severe with moderately calcified annulus 02/08/2014  . Mitral valve mass,  density - no fevers to suggest endocarditis 02/08/2014  . HOH (hard of hearing)   . Shortness of breath   . CHF (congestive heart failure)    Past Surgical History  Procedure Laterality Date  . Bladder surgery      bladder tack early 90's  . Tear duct probing  07/29/03    tear duct surg  . Cystourethroscopy  09/17/03  . Rotator cuff repair  ? date; 09/07/05    left; right( Dr. Gladstone Lighter)  . Appendectomy  1941  . Breast surgery  1981    breast reduction  . Eye surgery  03/2002    cataract OS  . Thyroid ultrasound  10/14/2003    MNG, no dominant masses  . Doppler echocardiography  03/05/2002&09/11/2003    ECHO, EF wnl, mild stenosis, A.S. mild MR, Mild T.R03/31/2003//ECHO EF 70%,LVH, ?diast dysfunction 09/11/2003  . Cataract extraction w/ intraocular lens  implant, bilateral  2003  . Dilation and curettage of uterus  09/07/2000    endometrial polyps removed, path all benign   . Tonsillectomy and adenoidectomy      "as a child"  . Fracture surgery  2010    right knee   Family History  Problem Relation Age of Onset  . Heart failure Mother     CHF, DM, HBP  . Hypertension Mother   . Uterine cancer Mother   . Stroke Mother   . Colon cancer Neg Hx   . Esophageal cancer Neg Hx   . Rectal  cancer Neg Hx   . Stomach cancer Neg Hx    History  Substance Use Topics  . Smoking status: Former Smoker -- 0.50 packs/day for 4 years    Types: Cigarettes    Quit date: 07/06/1974  . Smokeless tobacco: Never Used  . Alcohol Use: No   OB History   Grav Para Term Preterm Abortions TAB SAB Ect Mult Living                 Review of Systems  Constitutional:       Per HPI, otherwise negative  HENT:       Per HPI, otherwise negative  Respiratory:       Per HPI, otherwise negative  Cardiovascular:       Per HPI, otherwise negative  Gastrointestinal: Negative for vomiting.  Endocrine:       Negative aside from HPI  Genitourinary:       Neg aside from HPI   Musculoskeletal:       Per  HPI, otherwise negative  Skin: Negative.   Neurological: Positive for weakness. Negative for syncope.      Allergies  Lipitor; Morphine and related; Irbesartan; Ramipril; Telmisartan-hctz; Amiodarone; Hydrochlorothiazide; Metoprolol; Other; Tikosyn; and Norpace  Home Medications   Prior to Admission medications   Medication Sig Start Date End Date Taking? Authorizing Provider  aspirin 81 MG chewable tablet Chew 1 tablet (81 mg total) by mouth daily. 03/29/13  Yes Dayna N Dunn, PA-C  BESIVANCE 0.6 % SUSP Place 1 drop into both eyes 2 (two) times daily.  12/21/12  Yes Historical Provider, MD  diltiazem (CARDIZEM CD) 120 MG 24 hr capsule Take 120 mg by mouth daily. At [facility]. Take in addition to 180 mg capsule in the morning.   Yes Historical Provider, MD  diltiazem (CARDIZEM CD) 180 MG 24 hr capsule Take 180 mg by mouth every morning. Take in AM. Take in addition to 120 mg capsule at [facility].   Yes Historical Provider, MD  docusate sodium (COLACE) 100 MG capsule Take 1 capsule (100 mg total) by mouth 2 (two) times daily. 06/26/14  Yes Modena Jansky, MD  fish oil-omega-3 fatty acids 1000 MG capsule Take 1,000 mg by mouth daily.    Yes Historical Provider, MD  furosemide (LASIX) 20 MG tablet Take 20 mg by mouth daily. Take an extra tab daily as needed for fluid (if weight gain >3lbs). 06/26/14  Yes Modena Jansky, MD  HYDROcodone-acetaminophen (NORCO/VICODIN) 5-325 MG per tablet Take 0.5-1 tablets by mouth 2 (two) times daily as needed for moderate pain.   Yes Historical Provider, MD  levothyroxine (SYNTHROID, LEVOTHROID) 25 MCG tablet Take 25 mcg by mouth every morning.   Yes Historical Provider, MD  losartan (COZAAR) 50 MG tablet Take 50 mg by mouth daily. 02/08/14  Yes Cecilie Kicks, NP  Multiple Vitamins-Minerals (OCUVITE PRESERVISION) TABS Take 1 tablet by mouth 2 (two) times daily.    Yes Historical Provider, MD  omeprazole (PRILOSEC) 40 MG capsule Take 40 mg by mouth daily.   Yes Historical  Provider, MD  zolpidem (AMBIEN) 5 MG tablet Take 1 tablet (5 mg total) by mouth at bedtime as needed for sleep. 07/18/14  Yes Tonia Ghent, MD   BP 151/79  Pulse 145  Temp(Src) 98.1 F (36.7 C) (Oral)  Resp 18  SpO2 98% Physical Exam  Nursing note and vitals reviewed. Constitutional: She is oriented to person, place, and time. She has a sickly appearance.  HENT:  Head:  Normocephalic and atraumatic.  Eyes: Conjunctivae and EOM are normal.  Cardiovascular: Intact distal pulses.  An irregularly irregular rhythm present. Tachycardia present.   Murmur heard. Pulmonary/Chest: Breath sounds normal. No stridor. Tachypnea noted. No respiratory distress. She has no decreased breath sounds.  Abdominal: She exhibits no distension.  Musculoskeletal: She exhibits no edema.  Neurological: She is alert and oriented to person, place, and time. No cranial nerve deficit.  Skin: Skin is warm and dry.  Psychiatric: She has a normal mood and affect.    ED Course  Procedures (including critical care time) Labs Review Labs Reviewed  CBC - Abnormal; Notable for the following:    Hemoglobin 9.8 (*)    HCT 33.5 (*)    MCV 74.9 (*)    MCH 21.9 (*)    MCHC 29.3 (*)    RDW 17.8 (*)    All other components within normal limits  PRO B NATRIURETIC PEPTIDE - Abnormal; Notable for the following:    Pro B Natriuretic peptide (BNP) 1807.0 (*)    All other components within normal limits  BASIC METABOLIC PANEL - Abnormal; Notable for the following:    Sodium 126 (*)    Potassium 5.5 (*)    Glucose, Bld 135 (*)    GFR calc non Af Amer 82 (*)    All other components within normal limits  TROPONIN I  MAGNESIUM  TSH    Imaging Review Dg Chest Port 1 View  08/19/2014   CLINICAL DATA:  Atrial fibrillation with rapid ventricular response  EXAM: PORTABLE CHEST - 1 VIEW  COMPARISON:  Portable chest x-ray of June 25, 2014  FINDINGS: The lungs are well-expanded. The pulmonary interstitial markings are  increased. The pulmonary vascularity is mildly engorged. The cardiopericardial silhouette is enlarged but stable. There is no pleural effusion. The bony thorax is unremarkable.  IMPRESSION: CHF with mild pulmonary interstitial edema.   Electronically Signed   By: David  Martinique   On: 08/19/2014 12:27   Lasix provided    EKG Interpretation   Date/Time:  Monday August 19 2014 10:53:47 EDT Ventricular Rate:  119 PR Interval:    QRS Duration: 107 QT Interval:  347 QTC Calculation: 488 R Axis:   -29 Text Interpretation:  Atrial fibrillation Borderline left axis deviation  Anteroseptal infarct, old Repol abnrm suggests ischemia, lateral leads  Atrial fibrillation occasional ventricular complexes Left axis deviation  Abnormal ekg Confirmed by Carmin Muskrat  MD [phone]) on 08/19/2014  12:12:39 PM     After the initial evaluation, given the patient's atrial fibrillation, intermittent ventricular response, patient was started on a Cardizem drip.  I discussed the patient's case with cardiology, after I reviewed the patient's chart.   On exam the patient remains similarly symptomatic, with no new pain. Patient is now less tachycardic, though with persistent atrial fibrillation. Patient has received potassium supplementation, continues to receive Cardizem drip.  MDM  Elderly female with history of atrial fibrillation now presents with fatigue, centimeter fibrillation with intermittent rapid ventricular response. Patient had elevated BNP, hypokalemia as a notable labs. Following Lasix, potassium, Cardizem infusion, drip, patient had minimal change in her symptoms. Given the persistent atrial fibrillation, though her rate increased appropriately, she was admitted to the cardiology team for further evaluation and management.  CRITICAL CARE Performed by: Carmin Muskrat Total critical care time: 35 Critical care time was exclusive of separately billable procedures and treating other  patients. Critical care was necessary to treat or prevent imminent or life-threatening deterioration. Critical  care was time spent personally by me on the following activities: development of treatment plan with patient and/or surrogate as well as nursing, discussions with consultants, evaluation of patient's response to treatment, examination of patient, obtaining history from patient or surrogate, ordering and performing treatments and interventions, ordering and review of laboratory studies, ordering and review of radiographic studies, pulse oximetry and re-evaluation of patient's condition.    Carmin Muskrat, MD 08/19/14 1550

## 2014-08-19 NOTE — H&P (Signed)
Admission H&P  Reason for Consult: A-fib with RVR  Requesting Physician: Dr. Vanita Panda  Cardiologist: Dr. Martinique  HPI: This is a 80 y.o. female with a past medical history significant for HOCM, CAD with past NSTEMI in 03/2013 in the setting of rapid AF - has high grade stenosis in the first diagonal branch (moderate vessel in length but small in caliber) and a 70% stenosis in the 2nd DX - she was managed medically, PAF, diastolic HF, valvular heart disease with MR and advanced age. EF is normal by echo from April of 2014 but with grade II diastolic dysfunction, mild AS, mild MR and mild MS. She is intolerant to amiodarone and not a candidate for Tikosyn due to prolonged QT - not a candidate for anticoagulation given past history of bleeding and falls.  Admitted back in March with an acute onset of shortness of breath and hypoxia. Treated with oxygen, CPAP and diuresis. Echo updated - moderate to severe MR with a mobile density on the mitral valve - no fevers and not felt to be endocarditis - she did not wish to pursue cardiac surgery.  On follow up with Dr. Damita Dunnings she had her HCTZ stopped due to hyponatremia and placed on lasix. He started low dose beta blocker. She ended up not tolerating this - was dizzy and "horribly sick". Subsequently place on diltiazem. When seen by Dr. Damita Dunnings in May HR 117. Diltiazem increased to 180 mg bid. This was later reduced to 180 mg in the am and 120 mg in the pm due to side effects. Recently seen in the office by Dr. Martinique in follow-up. She states she has really felt bad for the past 2 weeks. Complains of palpitations. Has SOB and low energy. Legs cramp and are restless. She complains of itching. She has been monitoring weight and it has been stable. She rarely takes extra Lasix. HR was in the low 100's.  Presented to the ER today due to progressive weakness and palpitations. Noted to be in her chronic if not permanent a-fib with RVR in the 120-130 range. Placed  on IV cardizem with improved rate control, now in the 70's-80's. She says that she feels a little better.  She also said that her quality of life is poor, that she doesn't want aggressive measures and would be happy if she just "died". She denies, however, any suicidal or homicidal ideations.  PMHx:  Past Medical History  Diagnosis Date  . Atrial fibrillation 04/23-24/2007    a. recurrent PAF with RVR in September 2013. b. Evaluated 03/2013, previously intolerant to Norpace and Amiodarone - consider Multaq if recurs. c. Not on anticoag due to history of falls and also some internal bleeding per son.  . Hypertrophic cardiomyopathy   . Urinary incontinence   . Diverticulosis of colon (without mention of hemorrhage) 2003/ 08/2000    EGD/colonoscopy Barretts esophagus//H.H divertics 08/2000  . Cervical mass     C2 lateral mass fracture  . Hypertension   . Hypercholesterolemia     219/497  . Hypothyroidism   . Multinodular goiter (nontoxic)   . Osteoporosis   . Blood transfusion   . Jaundice ~ 1935    "in grade school"  . Degenerative joint disease     back  . Depression     "husband died 01-28-2011"  . Anemia, iron deficiency   . Personal history of colonic polyps 02/29/2012    tubular adenoma  . Barrett's esophagus   . CAD (coronary artery disease)  a. NSTEMI 03/2013: secondary to diagonal disease (small, not amenable to PCI, for med rx).  . Moderate mitral regurgitation 2014  . Mild aortic stenosis 2014  . Lung nodule seen on imaging study, pt does not wish further work up 02/08/2014  . Mitral valve regurgitation, mod to severe with moderately calcified annulus 02/08/2014  . Mitral valve mass, density - no fevers to suggest endocarditis 02/08/2014  . HOH (hard of hearing)   . Shortness of breath   . CHF (congestive heart failure)    Past Surgical History  Procedure Laterality Date  . Bladder surgery      bladder tack early 90's  . Tear duct probing  07/29/03    tear duct surg  .  Cystourethroscopy  09/17/03  . Rotator cuff repair  ? date; 09/07/05    left; right( Dr. Gladstone Lighter)  . Appendectomy  1941  . Breast surgery  1981    breast reduction  . Eye surgery  03/2002    cataract OS  . Thyroid ultrasound  10/14/2003    MNG, no dominant masses  . Doppler echocardiography  03/05/2002&09/11/2003    ECHO, EF wnl, mild stenosis, A.S. mild MR, Mild T.R03/31/2003//ECHO EF 70%,LVH, ?diast dysfunction 09/11/2003  . Cataract extraction w/ intraocular lens  implant, bilateral  2003  . Dilation and curettage of uterus  09/07/2000    endometrial polyps removed, path all benign   . Tonsillectomy and adenoidectomy      "as a child"  . Fracture surgery  2010    right knee    FAMHx: Family History  Problem Relation Age of Onset  . Heart failure Mother     CHF, DM, HBP  . Hypertension Mother   . Uterine cancer Mother   . Stroke Mother   . Colon cancer Neg Hx   . Esophageal cancer Neg Hx   . Rectal cancer Neg Hx   . Stomach cancer Neg Hx     SOCHx:  reports that she quit smoking about 40 years ago. Her smoking use included Cigarettes. She has a 2 pack-year smoking history. She has never used smokeless tobacco. She reports that she does not drink alcohol or use illicit drugs.  ALLERGIES: Allergies  Allergen Reactions  . Lipitor [Atorvastatin] Nausea Only and Other (See Comments)    LFT elevation  . Morphine And Related Other (See Comments)    "drives me crazy" and hyperactivity  . Irbesartan Swelling  . Ramipril Swelling    REACTION: lips swelling  . Telmisartan-Hctz Other (See Comments)    REACTION: incontinence  . Amiodarone        . Hydrochlorothiazide Other (See Comments)    hyponatremia  . Metoprolol Other (See Comments)    Headache, dizzy, "terribly sick"  . Other Other (See Comments)    ANTICOAGULANTS - not a candidate due to history of falls and bleeding  . Tikosyn [Dofetilide] Other (See Comments)    Not a candidate due to Prolonged QT  . Norpace  [Disopyramide] Other (See Comments)    Dry mouth    ROS: A comprehensive review of systems was negative except for: Constitutional: positive for fatigue and weakness Cardiovascular: positive for palpitations  HOME MEDICATIONS:   Medication List    ASK your doctor about these medications       aspirin 81 MG chewable tablet  Chew 1 tablet (81 mg total) by mouth daily.     BESIVANCE 0.6 % Susp  Generic drug:  Besifloxacin HCl  Place 1 drop  into both eyes 2 (two) times daily.     CARDIZEM CD 120 MG 24 hr capsule  Generic drug:  diltiazem  Take 120 mg by mouth daily. At [facility]. Take in addition to 180 mg capsule in the morning.     diltiazem 180 MG 24 hr capsule  Commonly known as:  CARDIZEM CD  Take 180 mg by mouth every morning. Take in AM. Take in addition to 120 mg capsule at [facility].     docusate sodium 100 MG capsule  Commonly known as:  COLACE  Take 1 capsule (100 mg total) by mouth 2 (two) times daily.     fish oil-omega-3 fatty acids 1000 MG capsule  Take 1,000 mg by mouth daily.     furosemide 20 MG tablet  Commonly known as:  LASIX  Take 20 mg by mouth daily. Take an extra tab daily as needed for fluid (if weight gain >3lbs).     HYDROcodone-acetaminophen 5-325 MG per tablet  Commonly known as:  NORCO/VICODIN  Take 0.5-1 tablets by mouth 2 (two) times daily as needed for moderate pain.     levothyroxine 25 MCG tablet  Commonly known as:  SYNTHROID, LEVOTHROID  Take 25 mcg by mouth every morning.     losartan 50 MG tablet  Commonly known as:  COZAAR  Take 50 mg by mouth daily.     OCUVITE PRESERVISION Tabs  Take 1 tablet by mouth 2 (two) times daily.     omeprazole 40 MG capsule  Commonly known as:  PRILOSEC  Take 40 mg by mouth daily.     zolpidem 5 MG tablet  Commonly known as:  AMBIEN  Take 1 tablet (5 mg total) by mouth at bedtime as needed for sleep.        HOSPITAL MEDICATIONS: Current facility-administered medications:diltiazem (CARDIZEM) 100  mg in dextrose 5 % 100 mL (1 mg/mL) infusion, 5-15 mg/hr, Intravenous, Continuous, Carmin Muskrat, MD, Last Rate: 10 mL/hr at 08/19/14 1240, 10 mg/hr at 08/19/14 1240 Current outpatient prescriptions:aspirin 81 MG chewable tablet, Chew 1 tablet (81 mg total) by mouth daily., Disp: , Rfl: ;  BESIVANCE 0.6 % SUSP, Place 1 drop into both eyes 2 (two) times daily. , Disp: , Rfl: ;  diltiazem (CARDIZEM CD) 120 MG 24 hr capsule, Take 120 mg by mouth daily. At [facility]. Take in addition to 180 mg capsule in the morning., Disp: , Rfl:  diltiazem (CARDIZEM CD) 180 MG 24 hr capsule, Take 180 mg by mouth every morning. Take in AM. Take in addition to 120 mg capsule at Noon., Disp: , Rfl: ;  docusate sodium (COLACE) 100 MG capsule, Take 1 capsule (100 mg total) by mouth 2 (two) times daily., Disp: 60 capsule, Rfl: 0;  fish oil-omega-3 fatty acids 1000 MG capsule, Take 1,000 mg by mouth daily. , Disp: , Rfl:  furosemide (LASIX) 20 MG tablet, Take 20 mg by mouth daily. Take an extra tab daily as needed for fluid (if weight gain >3lbs)., Disp: , Rfl: ;  HYDROcodone-acetaminophen (NORCO/VICODIN) 5-325 MG per tablet, Take 0.5-1 tablets by mouth 2 (two) times daily as needed for moderate pain., Disp: , Rfl: ;  levothyroxine (SYNTHROID, LEVOTHROID) 25 MCG tablet, Take 25 mcg by mouth every morning., Disp: , Rfl:  losartan (COZAAR) 50 MG tablet, Take 50 mg by mouth daily., Disp: , Rfl: ;  Multiple Vitamins-Minerals (OCUVITE PRESERVISION) TABS, Take 1 tablet by mouth 2 (two) times daily. , Disp: , Rfl: ;  omeprazole (PRILOSEC) 40 MG  capsule, Take 40 mg by mouth daily., Disp: , Rfl: ;  zolpidem (AMBIEN) 5 MG tablet, Take 1 tablet (5 mg total) by mouth at bedtime as needed for sleep., Disp: 90 tablet, Rfl: 1  VITALS: Blood pressure 106/70, pulse 84, temperature 98.1 F (36.7 C), temperature source Oral, resp. rate 22, SpO2 100.00%.  PHYSICAL EXAM: General appearance: alert and no distress Neck: no carotid bruit and no JVD Lungs:  diminished breath sounds bibasilar Heart: irregularly irregular rhythm, S1, S2 normal, systolic murmur: early systolic 3/6, crescendo at 2nd right intercostal space and 2nd systolic murmur: systolic ejection 3/6, blowing at apex Abdomen: soft, non-tender; bowel sounds normal; no masses,  no organomegaly Extremities: extremities normal, atraumatic, no cyanosis or edema Pulses: 2+ and symmetric Skin: Skin color, texture, turgor normal. No rashes or lesions Neurologic: Grossly normal Psych: Appears somewhat depressed, mentioned wanting to die  LABS: Results for orders placed during the hospital encounter of 08/19/14 (from the past 48 hour(s))  CBC     Status: Abnormal   Collection Time    08/19/14 11:17 AM      Result Value Ref Range   WBC 6.1  4.0 - 10.5 K/uL   RBC 4.47  3.87 - 5.11 MIL/uL   Hemoglobin 9.8 (*) 12.0 - 15.0 g/dL   HCT 33.5 (*) 36.0 - 46.0 %   MCV 74.9 (*) 78.0 - 100.0 fL   MCH 21.9 (*) 26.0 - 34.0 pg   MCHC 29.3 (*) 30.0 - 36.0 g/dL   RDW 17.8 (*) 11.5 - 15.5 %   Platelets 238  150 - 400 K/uL  TROPONIN I     Status: None   Collection Time    08/19/14 11:17 AM      Result Value Ref Range   Troponin I <0.30  <0.30 ng/mL   Comment:            Due to the release kinetics of cTnI,     a negative result within the first hours     of the onset of symptoms does not rule out     myocardial infarction with certainty.     If myocardial infarction is still suspected,     repeat the test at appropriate intervals.  MAGNESIUM     Status: None   Collection Time    08/19/14 11:17 AM      Result Value Ref Range   Magnesium 2.1  1.5 - 2.5 mg/dL  PRO B NATRIURETIC PEPTIDE     Status: Abnormal   Collection Time    08/19/14 11:17 AM      Result Value Ref Range   Pro B Natriuretic peptide (BNP) 1807.0 (*) 0 - 450 pg/mL  TSH     Status: None   Collection Time    08/19/14 11:17 AM      Result Value Ref Range   TSH 1.460  0.350 - 4.500 uIU/mL  BASIC METABOLIC PANEL     Status:  Abnormal   Collection Time    08/19/14 11:17 AM      Result Value Ref Range   Sodium 126 (*) 137 - 147 mEq/L   Potassium 5.5 (*) 3.7 - 5.3 mEq/L   Chloride 96  96 - 112 mEq/L   CO2 25  19 - 32 mEq/L   Glucose, Bld 135 (*) 70 - 99 mg/dL   BUN 11  6 - 23 mg/dL   Creatinine, Ser 0.51  0.50 - 1.10 mg/dL   Calcium 9.3  8.4 -  10.5 mg/dL   GFR calc non Af Amer 82 (*) >90 mL/min   GFR calc Af Amer >90  >90 mL/min   Comment: (NOTE)     The eGFR has been calculated using the CKD EPI equation.     This calculation has not been validated in all clinical situations.     eGFR's persistently <90 mL/min signify possible Chronic Kidney     Disease.   Anion gap 5  5 - 15    IMAGING: Dg Chest Port 1 View  08/19/2014   CLINICAL DATA:  Atrial fibrillation with rapid ventricular response  EXAM: PORTABLE CHEST - 1 VIEW  COMPARISON:  Portable chest x-ray of June 25, 2014  FINDINGS: The lungs are well-expanded. The pulmonary interstitial markings are increased. The pulmonary vascularity is mildly engorged. The cardiopericardial silhouette is enlarged but stable. There is no pleural effusion. The bony thorax is unremarkable.  IMPRESSION: CHF with mild pulmonary interstitial edema.   Electronically Signed   By: David  Martinique   On: 08/19/2014 12:27    HOSPITAL DIAGNOSES: Principal Problem:   Atrial fibrillation with RVR Active Problems:   PAF (paroxysmal atrial fibrillation)   Chronic diastolic CHF (congestive heart failure)   IMPRESSION: 1. Chronic a-fib with RVR 2. Acute on chronic diastolic heart failure 3. Aortic stenosis 4. Mitral valve disease/calcified annulus/regurgitation  RECOMMENDATION: 1. Still awaiting labwork. There may be mild exacerbation of diastolic heart failure. She is not desiring any more invasive treatments for a-fib. Since she has failed antiarrhythmics, options including trying rate control, selective ablation or AVN ablation/pacer. She is not interested in the latter options.  I would therefore recommend increasing her diltiazem back to 180 mg CD twice daily.  Would give 40 mg IV lasix x 1 now. May need to increase her home lasix dose to 40 mg daily. If she is not significantly improved, she may require admission, however, if breathing is improved, could likely be discharged home with outpatient follow-up. She has home assistance and family that will stay with her.  Thanks for consulting [place].  Time Spent Directly with Patient: 30 minutes  Pixie Casino, MD, [facility] Attending Cardiologist [facility]  HILTY,Kenneth C 08/19/2014, 2:58 PM   ADDENDUM: Labwork shows hyponatremia and borderline hyperkalemia (prior to lasix). BNP is elevated mildly consistent with probably diastolic CHF secondary to a-fib with RVR. Agree with admission - additional diuresis tonight IV, may be able to switch back to po diuretics tomorrow.  Will need PT evaluation to see if she is safe to go home or will need services.  Pixie Casino, MD, [facility] Attending Cardiologist [place]

## 2014-08-19 NOTE — Progress Notes (Addendum)
Cross Cover Note: Called regarding need for admit orders. Reviewed pt and briefly spoke with her. 89 with HFpEF, afib presenting with mild fluid overload, afib with RVR and electrolyte abnormalities. Given lasix in the ER, now lying flat comfortably. Still on dilt gtt at 10.  Admit to obs, continue dilt gtt at 5, restart home oral ccb, and wean to off for goal hr < 110 Continue home lasix, monitoring hyponatremia. Hold losartan due to k of 5.5, recheck in AM. Pt confirms DNR wishes.

## 2014-08-19 NOTE — Consult Note (Signed)
CONSULTATION NOTE  Reason for Consult: A-fib with RVR  Requesting Physician: Dr. Vanita Panda  Cardiologist: Dr. Martinique  HPI: This is a 80 y.o. female with a past medical history significant for HOCM, CAD with past NSTEMI in 03/2013 in the setting of rapid AF - has high grade stenosis in the first diagonal branch (moderate vessel in length but small in caliber) and a 70% stenosis in the 2nd DX - she was managed medically, PAF, diastolic HF, valvular heart disease with MR and advanced age. EF is normal by echo from April of 2014 but with grade II diastolic dysfunction, mild AS, mild MR and mild MS. She is intolerant to amiodarone and not a candidate for Tikosyn due to prolonged QT - not a candidate for anticoagulation given past history of bleeding and falls.  Admitted back in March with an acute onset of shortness of breath and hypoxia. Treated with oxygen, CPAP and diuresis. Echo updated - moderate to severe MR with a mobile density on the mitral valve - no fevers and not felt to be endocarditis - she did not wish to pursue cardiac surgery.  On follow up with Dr. Damita Dunnings she had her HCTZ stopped due to hyponatremia and placed on lasix. He started low dose beta blocker. She ended up not tolerating this - was dizzy and "horribly sick". Subsequently place on diltiazem. When seen by Dr. Damita Dunnings in May HR 117. Diltiazem increased to 180 mg bid. This was later reduced to 180 mg in the am and 120 mg in the pm due to side effects. Recently seen in the office by Dr. Martinique in follow-up. She states she has really felt bad for the past 2 weeks. Complains of palpitations. Has SOB and low energy. Legs cramp and are restless. She complains of itching. She has been monitoring weight and it has been stable. She rarely takes extra Lasix. HR was in the low 100's.  Presented to the ER today due to progressive weakness and palpitations. Noted to be in her chronic if not permanent a-fib with RVR in the 120-130 range.  Placed on IV cardizem with improved rate control, now in the 70's-80's. She says that she feels a little better.  She also said that her quality of life is poor, that she doesn't want aggressive measures and would be happy if she just "died". She denies, however, any suicidal or homicidal ideations.  PMHx:  Past Medical History  Diagnosis Date  . Atrial fibrillation 04/23-24/2007    a. recurrent PAF with RVR in September 2013. b. Evaluated 03/2013, previously intolerant to Norpace and Amiodarone - consider Multaq if recurs. c. Not on anticoag due to history of falls and also some internal bleeding per son.  . Hypertrophic cardiomyopathy   . Urinary incontinence   . Diverticulosis of colon (without mention of hemorrhage) 2003/ 08/2000    EGD/colonoscopy Barretts esophagus//H.H divertics 08/2000  . Cervical mass     C2 lateral mass fracture  . Hypertension   . Hypercholesterolemia     219/497  . Hypothyroidism   . Multinodular goiter (nontoxic)   . Osteoporosis   . Blood transfusion   . Jaundice ~ 1935    "in grade school"  . Degenerative joint disease     back  . Depression     "husband died February 23, 2011"  . Anemia, iron deficiency   . Personal history of colonic polyps 02/29/2012    tubular adenoma  . Barrett's esophagus   . CAD (coronary artery disease)  a. NSTEMI 03/2013: secondary to diagonal disease (small, not amenable to PCI, for med rx).  . Moderate mitral regurgitation 2014  . Mild aortic stenosis 2014  . Lung nodule seen on imaging study, pt does not wish further work up 02/08/2014  . Mitral valve regurgitation, mod to severe with moderately calcified annulus 02/08/2014  . Mitral valve mass, density - no fevers to suggest endocarditis 02/08/2014  . HOH (hard of hearing)   . Shortness of breath   . CHF (congestive heart failure)    Past Surgical History  Procedure Laterality Date  . Bladder surgery      bladder tack early 90's  . Tear duct probing  07/29/03    tear duct surg   . Cystourethroscopy  09/17/03  . Rotator cuff repair  ? date; 09/07/05    left; right( Dr. Gladstone Lighter)  . Appendectomy  1941  . Breast surgery  1981    breast reduction  . Eye surgery  03/2002    cataract OS  . Thyroid ultrasound  10/14/2003    MNG, no dominant masses  . Doppler echocardiography  03/05/2002&09/11/2003    ECHO, EF wnl, mild stenosis, A.S. mild MR, Mild T.R03/31/2003//ECHO EF 70%,LVH, ?diast dysfunction 09/11/2003  . Cataract extraction w/ intraocular lens  implant, bilateral  2003  . Dilation and curettage of uterus  09/07/2000    endometrial polyps removed, path all benign   . Tonsillectomy and adenoidectomy      "as a child"  . Fracture surgery  2010    right knee    FAMHx: Family History  Problem Relation Age of Onset  . Heart failure Mother     CHF, DM, HBP  . Hypertension Mother   . Uterine cancer Mother   . Stroke Mother   . Colon cancer Neg Hx   . Esophageal cancer Neg Hx   . Rectal cancer Neg Hx   . Stomach cancer Neg Hx     SOCHx:  reports that she quit smoking about 40 years ago. Her smoking use included Cigarettes. She has a 2 pack-year smoking history. She has never used smokeless tobacco. She reports that she does not drink alcohol or use illicit drugs.  ALLERGIES: Allergies  Allergen Reactions  . Lipitor [Atorvastatin] Nausea Only and Other (See Comments)    LFT elevation  . Morphine And Related Other (See Comments)    "drives me crazy" and hyperactivity  . Irbesartan Swelling  . Ramipril Swelling    REACTION: lips swelling  . Telmisartan-Hctz Other (See Comments)    REACTION: incontinence  . Amiodarone        . Hydrochlorothiazide Other (See Comments)    hyponatremia  . Metoprolol Other (See Comments)    Headache, dizzy, "terribly sick"  . Other Other (See Comments)    ANTICOAGULANTS - not a candidate due to history of falls and bleeding  . Tikosyn [Dofetilide] Other (See Comments)    Not a candidate due to Prolonged QT  .  Norpace [Disopyramide] Other (See Comments)    Dry mouth    ROS: A comprehensive review of systems was negative except for: Constitutional: positive for fatigue and weakness Cardiovascular: positive for palpitations  HOME MEDICATIONS:   Medication List    ASK your doctor about these medications       aspirin 81 MG chewable tablet  Chew 1 tablet (81 mg total) by mouth daily.     BESIVANCE 0.6 % Susp  Generic drug:  Besifloxacin HCl  Place 1 drop  into both eyes 2 (two) times daily.     CARDIZEM CD 120 MG 24 hr capsule  Generic drug:  diltiazem  Take 120 mg by mouth daily. At [facility]. Take in addition to 180 mg capsule in the morning.     diltiazem 180 MG 24 hr capsule  Commonly known as:  CARDIZEM CD  Take 180 mg by mouth every morning. Take in AM. Take in addition to 120 mg capsule at [facility].     docusate sodium 100 MG capsule  Commonly known as:  COLACE  Take 1 capsule (100 mg total) by mouth 2 (two) times daily.     fish oil-omega-3 fatty acids 1000 MG capsule  Take 1,000 mg by mouth daily.     furosemide 20 MG tablet  Commonly known as:  LASIX  Take 20 mg by mouth daily. Take an extra tab daily as needed for fluid (if weight gain >3lbs).     HYDROcodone-acetaminophen 5-325 MG per tablet  Commonly known as:  NORCO/VICODIN  Take 0.5-1 tablets by mouth 2 (two) times daily as needed for moderate pain.     levothyroxine 25 MCG tablet  Commonly known as:  SYNTHROID, LEVOTHROID  Take 25 mcg by mouth every morning.     losartan 50 MG tablet  Commonly known as:  COZAAR  Take 50 mg by mouth daily.     OCUVITE PRESERVISION Tabs  Take 1 tablet by mouth 2 (two) times daily.     omeprazole 40 MG capsule  Commonly known as:  PRILOSEC  Take 40 mg by mouth daily.     zolpidem 5 MG tablet  Commonly known as:  AMBIEN  Take 1 tablet (5 mg total) by mouth at bedtime as needed for sleep.        HOSPITAL MEDICATIONS: Current facility-administered medications:diltiazem  (CARDIZEM) 100 mg in dextrose 5 % 100 mL (1 mg/mL) infusion, 5-15 mg/hr, Intravenous, Continuous, Carmin Muskrat, MD, Last Rate: 5 mL/hr at 08/19/14 1139, 5 mg/hr at 08/19/14 1139 Current outpatient prescriptions:aspirin 81 MG chewable tablet, Chew 1 tablet (81 mg total) by mouth daily., Disp: , Rfl: ;  BESIVANCE 0.6 % SUSP, Place 1 drop into both eyes 2 (two) times daily. , Disp: , Rfl: ;  diltiazem (CARDIZEM CD) 120 MG 24 hr capsule, Take 120 mg by mouth daily. At [facility]. Take in addition to 180 mg capsule in the morning., Disp: , Rfl:  diltiazem (CARDIZEM CD) 180 MG 24 hr capsule, Take 180 mg by mouth every morning. Take in AM. Take in addition to 120 mg capsule at Noon., Disp: , Rfl: ;  docusate sodium (COLACE) 100 MG capsule, Take 1 capsule (100 mg total) by mouth 2 (two) times daily., Disp: 60 capsule, Rfl: 0;  fish oil-omega-3 fatty acids 1000 MG capsule, Take 1,000 mg by mouth daily. , Disp: , Rfl:  furosemide (LASIX) 20 MG tablet, Take 20 mg by mouth daily. Take an extra tab daily as needed for fluid (if weight gain >3lbs)., Disp: , Rfl: ;  HYDROcodone-acetaminophen (NORCO/VICODIN) 5-325 MG per tablet, Take 0.5-1 tablets by mouth 2 (two) times daily as needed for moderate pain., Disp: , Rfl: ;  levothyroxine (SYNTHROID, LEVOTHROID) 25 MCG tablet, Take 25 mcg by mouth every morning., Disp: , Rfl:  losartan (COZAAR) 50 MG tablet, Take 50 mg by mouth daily., Disp: , Rfl: ;  Multiple Vitamins-Minerals (OCUVITE PRESERVISION) TABS, Take 1 tablet by mouth 2 (two) times daily. , Disp: , Rfl: ;  omeprazole (PRILOSEC) 40 MG  capsule, Take 40 mg by mouth daily., Disp: , Rfl: ;  zolpidem (AMBIEN) 5 MG tablet, Take 1 tablet (5 mg total) by mouth at bedtime as needed for sleep., Disp: 90 tablet, Rfl: 1  VITALS: Blood pressure 124/65, pulse 95, temperature 98.1 F (36.7 C), temperature source Oral, resp. rate 21, SpO2 100.00%.  PHYSICAL EXAM: General appearance: alert and no distress Neck: no carotid bruit and  no JVD Lungs: diminished breath sounds bibasilar Heart: irregularly irregular rhythm, S1, S2 normal, systolic murmur: early systolic 3/6, crescendo at 2nd right intercostal space and 2nd systolic murmur: systolic ejection 3/6, blowing at apex Abdomen: soft, non-tender; bowel sounds normal; no masses,  no organomegaly Extremities: extremities normal, atraumatic, no cyanosis or edema Pulses: 2+ and symmetric Skin: Skin color, texture, turgor normal. No rashes or lesions Neurologic: Grossly normal Psych: Appears somewhat depressed, mentioned wanting to die  LABS: Results for orders placed during the hospital encounter of 08/19/14 (from the past 48 hour(s))  CBC     Status: Abnormal   Collection Time    08/19/14 11:17 AM      Result Value Ref Range   WBC 6.1  4.0 - 10.5 K/uL   RBC 4.47  3.87 - 5.11 MIL/uL   Hemoglobin 9.8 (*) 12.0 - 15.0 g/dL   HCT 33.5 (*) 36.0 - 46.0 %   MCV 74.9 (*) 78.0 - 100.0 fL   MCH 21.9 (*) 26.0 - 34.0 pg   MCHC 29.3 (*) 30.0 - 36.0 g/dL   RDW 17.8 (*) 11.5 - 15.5 %   Platelets 238  150 - 400 K/uL  MAGNESIUM     Status: None   Collection Time    08/19/14 11:17 AM      Result Value Ref Range   Magnesium 2.1  1.5 - 2.5 mg/dL    IMAGING: No results found.  HOSPITAL DIAGNOSES: Principal Problem:   Atrial fibrillation with RVR Active Problems:   PAF (paroxysmal atrial fibrillation)   Chronic diastolic CHF (congestive heart failure)   IMPRESSION: 1. Chronic a-fib with RVR 2. Acute on chronic diastolic heart failure 3. Aortic stenosis 4. Mitral valve disease/calcified annulus/regurgitation  RECOMMENDATION: 1. Still awaiting labwork. There may be mild exacerbation of diastolic heart failure. She is not desiring any more invasive treatments for a-fib. Since she has failed antiarrhythmics, options including trying rate control, selective ablation or AVN ablation/pacer. She is not interested in the latter options. I would therefore recommend increasing her  diltiazem back to 180 mg CD twice daily.  Would give 40 mg IV lasix x 1 now. May need to increase her home lasix dose to 40 mg daily. If she is not significantly improved, she may require admission, however, if breathing is improved, could likely be discharged home with outpatient follow-up. She has home assistance and family that will stay with her.  Thanks for consulting [place].  Time Spent Directly with Patient: 30 minutes  Pixie Casino, MD, [facility] Attending Cardiologist [facility]  Baruch Lewers C 08/19/2014, 12:28 PM

## 2014-08-19 NOTE — Progress Notes (Signed)
Patient HR sustaining in 70's and 80's.  MD notified and advised that there is an active order for Cardizem drip, MD ordered to leave the order active but not to hang the Cardizem.  RN verified with read back.  MD also notified of headache.  Tylenol ordered and given per order. RN will continue to monitor. Shellee Milo, RN

## 2014-08-19 NOTE — ED Notes (Signed)
Pt here from home with c/o afib with RVR , pt has history of same , rate is 110 to 150

## 2014-08-20 DIAGNOSIS — I2510 Atherosclerotic heart disease of native coronary artery without angina pectoris: Secondary | ICD-10-CM | POA: Diagnosis present

## 2014-08-20 DIAGNOSIS — Z9849 Cataract extraction status, unspecified eye: Secondary | ICD-10-CM | POA: Diagnosis not present

## 2014-08-20 DIAGNOSIS — H9190 Unspecified hearing loss, unspecified ear: Secondary | ICD-10-CM | POA: Diagnosis present

## 2014-08-20 DIAGNOSIS — I10 Essential (primary) hypertension: Secondary | ICD-10-CM | POA: Diagnosis present

## 2014-08-20 DIAGNOSIS — R5381 Other malaise: Secondary | ICD-10-CM | POA: Diagnosis present

## 2014-08-20 DIAGNOSIS — E042 Nontoxic multinodular goiter: Secondary | ICD-10-CM | POA: Diagnosis present

## 2014-08-20 DIAGNOSIS — Z7982 Long term (current) use of aspirin: Secondary | ICD-10-CM | POA: Diagnosis not present

## 2014-08-20 DIAGNOSIS — Z961 Presence of intraocular lens: Secondary | ICD-10-CM | POA: Diagnosis not present

## 2014-08-20 DIAGNOSIS — Z87891 Personal history of nicotine dependence: Secondary | ICD-10-CM | POA: Diagnosis not present

## 2014-08-20 DIAGNOSIS — E871 Hypo-osmolality and hyponatremia: Secondary | ICD-10-CM | POA: Diagnosis present

## 2014-08-20 DIAGNOSIS — F329 Major depressive disorder, single episode, unspecified: Secondary | ICD-10-CM | POA: Diagnosis present

## 2014-08-20 DIAGNOSIS — I252 Old myocardial infarction: Secondary | ICD-10-CM | POA: Diagnosis not present

## 2014-08-20 DIAGNOSIS — E875 Hyperkalemia: Secondary | ICD-10-CM | POA: Diagnosis present

## 2014-08-20 DIAGNOSIS — I4891 Unspecified atrial fibrillation: Secondary | ICD-10-CM | POA: Diagnosis present

## 2014-08-20 DIAGNOSIS — I080 Rheumatic disorders of both mitral and aortic valves: Secondary | ICD-10-CM | POA: Diagnosis present

## 2014-08-20 DIAGNOSIS — Z66 Do not resuscitate: Secondary | ICD-10-CM | POA: Diagnosis present

## 2014-08-20 DIAGNOSIS — I059 Rheumatic mitral valve disease, unspecified: Secondary | ICD-10-CM

## 2014-08-20 DIAGNOSIS — F3289 Other specified depressive episodes: Secondary | ICD-10-CM | POA: Diagnosis present

## 2014-08-20 DIAGNOSIS — E7800 Pure hypercholesterolemia, unspecified: Secondary | ICD-10-CM | POA: Diagnosis present

## 2014-08-20 DIAGNOSIS — I509 Heart failure, unspecified: Secondary | ICD-10-CM | POA: Diagnosis present

## 2014-08-20 DIAGNOSIS — E039 Hypothyroidism, unspecified: Secondary | ICD-10-CM | POA: Diagnosis present

## 2014-08-20 DIAGNOSIS — I421 Obstructive hypertrophic cardiomyopathy: Secondary | ICD-10-CM | POA: Diagnosis present

## 2014-08-20 DIAGNOSIS — I5033 Acute on chronic diastolic (congestive) heart failure: Secondary | ICD-10-CM | POA: Diagnosis present

## 2014-08-20 DIAGNOSIS — Z79899 Other long term (current) drug therapy: Secondary | ICD-10-CM | POA: Diagnosis not present

## 2014-08-20 LAB — BASIC METABOLIC PANEL
Anion gap: 13 (ref 5–15)
BUN: 14 mg/dL (ref 6–23)
CALCIUM: 8.6 mg/dL (ref 8.4–10.5)
CO2: 26 meq/L (ref 19–32)
Chloride: 103 mEq/L (ref 96–112)
Creatinine, Ser: 0.83 mg/dL (ref 0.50–1.10)
GFR calc Af Amer: 70 mL/min — ABNORMAL LOW (ref >90)
GFR calc non Af Amer: 60 mL/min — ABNORMAL LOW (ref >90)
GLUCOSE: 104 mg/dL — AB (ref 70–99)
Potassium: 3.7 mEq/L (ref 3.7–5.3)
SODIUM: 142 meq/L (ref 137–147)

## 2014-08-20 MED ORDER — DILTIAZEM HCL ER COATED BEADS 180 MG PO CP24: 180.0000 mg | ORAL_CAPSULE | Freq: Two times a day (BID) | ORAL | Status: DC

## 2014-08-20 MED ORDER — DILTIAZEM HCL ER COATED BEADS 180 MG PO CP24
180.0000 mg | ORAL_CAPSULE | Freq: Every day | ORAL | Status: DC
  Administered 2014-08-20: 180 mg via ORAL
  Filled 2014-08-20: qty 1

## 2014-08-20 MED ORDER — FUROSEMIDE 20 MG PO TABS
20.0000 mg | ORAL_TABLET | Freq: Every day | ORAL | Status: DC | PRN
  Filled 2014-08-20: qty 1

## 2014-08-20 NOTE — Progress Notes (Signed)
Utilization Review Completed.Cindy [name] T9/15/2015  

## 2014-08-20 NOTE — Progress Notes (Signed)
Subjective: Feeling better.  Objective: Vital signs in last 24 hours: Temp:  [97 F (36.1 C)-98.1 F (36.7 C)] 97.8 F (36.6 C) (09/15 0502) Pulse Rate:  [65-145] 69 (09/15 0502) Resp:  [18-25] 18 (09/15 0502) BP: (99-151)/(46-98) 129/76 mmHg (09/15 0502) SpO2:  [98 %-100 %] 100 % (09/15 0502) Weight:  [131 lb 11.2 oz (59.739 kg)] 131 lb 11.2 oz (59.739 kg) (09/15 0502) Last BM Date: 08/18/14  Intake/Output from previous day: 09/14 0701 - 09/15 0700 In: 120 [P.O.:120] Out: 425 [Urine:425] Intake/Output this shift: Total I/O In: 240 [P.O.:240] Out: 201 [Urine:200; Stool:1]  Medications Current Facility-Administered Medications  Medication Dose Route Frequency Provider Last Rate Last Dose  . acetaminophen (TYLENOL) tablet 650 mg  650 mg Oral Q6H PRN Cletus Gash, MD   650 mg at 08/20/14 0849  . aspirin EC tablet 81 mg  81 mg Oral Daily Cletus Gash, MD      . diltiazem [facility] CD) 24 hr capsule 120 mg  120 mg Oral Q1200 Cletus Gash, MD      . diltiazem (CARDIZEM CD) 24 hr capsule 180 mg  180 mg Oral Darreld Mclean, MD   180 mg at 08/20/14 0602  . diltiazem (CARDIZEM) 100 mg in dextrose 5 % 100 mL (1 mg/mL) infusion  5 mg/hr Intravenous Titrated Cletus Gash, MD      . docusate sodium (COLACE) capsule 100 mg  100 mg Oral BID Cletus Gash, MD   100 mg at 08/19/14 2335  . enoxaparin (LOVENOX) injection 30 mg  30 mg Subcutaneous Q24H Cletus Gash, MD      . levothyroxine (SYNTHROID, LEVOTHROID) tablet 25 mcg  25 mcg Oral QAC breakfast Cletus Gash, MD   25 mcg at 08/20/14 0602  . pantoprazole (PROTONIX) EC tablet 40 mg  40 mg Oral Daily Cletus Gash, MD      . sodium chloride 0.9 % injection 3 mL  3 mL Intravenous Q12H Cletus Gash, MD   3 mL at 08/19/14 2335    PE: General appearance: alert, cooperative and no distress Lungs: Mild basilar crackles Heart: irregularly irregular rhythm and Rate controlled. 1/6 sys MM Abdomen:  +BS, Nontender.  soft.  no distension Extremities: No LEE Pulses: 2+ and symmetric Skin: Warm and dry Neurologic: Grossly normal  Lab Results:   Recent Labs  08/19/14 1117  WBC 6.1  HGB 9.8*  HCT 33.5*  PLT 238   BMET  Recent Labs  08/19/14 1117 08/20/14 0350  NA 126* 142  K 5.5* 3.7  CL 96 103  CO2 25 26  GLUCOSE 135* 104*  BUN 11 14  CREATININE 0.51 0.83  CALCIUM 9.3 8.6    Assessment/Plan  Principal Problem:   Atrial fibrillation with RVR  Rate 90.  On Po cardizem 180, ASA81,  TSH WNL.  No invasive procedures per patient.  No Beta blocker- previously  tried and felt poorly.    Acute on chronic diastolic congestive heart failure SP one dose IV lasix 40mg .  Wt 131.  Was 135 on 9/10(office?).  She take 20mg  Lasix PRN daily at home.  Will give this morning.    Aortic stenosis     Mitral valve disease/calcified annulus/regurgitation  Mod to severe.  Does not want and surgery.    Hyperkalemia: resolved  Disposition:  Up and ambulate.  If she does well, I think we can DC her home.     LOS: 1 day    HAGER, BRYAN PA-C 08/20/2014 9:54 AM  Personally seen and examined. Agree with above. Heart rate has improved, periodically it does increase with activity. Overall, she feels as though she would be better at home. I think we should discharge her with close followup. She lives alone but she states that she has help. From her medicine reconciliation, it appears that she takes diltiazem 180 mg 24 hour capsule in the morning and 120 mg at noon. I would recommend increasing her to 180 mg CD twice a day.  Followup with Dr. Martinique in clinic in one to 2 weeks. Candee Furbish, MD

## 2014-08-20 NOTE — Progress Notes (Signed)
Pt a/o, pt c/o HA PRN tylenol given, pt ambulated in hallway, pt stable

## 2014-08-20 NOTE — Discharge Summary (Signed)
Physician Discharge Summary      Cardiologist:  Martinique Patient ID: Cindy [name] MRN: [id number] DOB/AGE: 04-13-1922 80 y.o.  Admit date: 08/19/2014 Discharge date: 08/20/2014  Admission Diagnoses: Atrial fibrillation with RVR,  Acute on chronic diastolic congestive heart failure  Discharge Diagnoses:  Principal Problem:   Atrial fibrillation with RVR   Acute on chronic diastolic congestive heart failure   Aortic stenosis   Mod to Sev MR   Hyperkalemia  Discharged Condition: stable  Hospital Course:   This is a 80 y.o. female with a past medical history significant for HOCM, CAD with past NSTEMI in 03/2013 in the setting of rapid AF - has high grade stenosis in the first diagonal branch (moderate vessel in length but small in caliber) and a 70% stenosis in the 2nd DX - she was managed medically, PAF, diastolic HF, valvular heart disease with MR and advanced age. EF is normal by echo from April of 2014 but with grade II diastolic dysfunction, mild AS, mild MR and mild MS. She is intolerant to amiodarone and not a candidate for Tikosyn due to prolonged QT - not a candidate for anticoagulation given past history of bleeding and falls. Admitted back in March with an acute onset of shortness of breath and hypoxia. Treated with oxygen, CPAP and diuresis. Echo updated - moderate to severe MR with a mobile density on the mitral valve - no fevers and not felt to be endocarditis - she did not wish to pursue cardiac surgery.   On follow up with Dr. Damita Dunnings she had her HCTZ stopped due to hyponatremia and placed on lasix. He started low dose beta blocker. She ended up not tolerating this - was dizzy and "horribly sick". Subsequently place on diltiazem. When seen by Dr. Damita Dunnings in May HR 117. Diltiazem increased to 180 mg bid. This was later reduced to 180 mg in the am and 120 mg in the pm due to side effects. Recently seen in the office by Dr. Martinique in follow-up. She states she has really felt bad for  the past 2 weeks. Complains of palpitations. Has SOB and low energy. Legs cramp and are restless. She complains of itching. She has been monitoring weight and it has been stable. She rarely takes extra Lasix. HR was in the low 100's.  Presented to the ER today due to progressive weakness and palpitations. Noted to be in her chronic if not permanent a-fib with RVR in the 120-130 range. Placed on IV cardizem with improved rate control, now in the 70's-80's. She says that she feels a little better. She also said that her quality of life is poor, that she doesn't want aggressive measures and would be happy if she just "died". She denies, however, any suicidal or homicidal ideations.  She was admitted and changed over from IV to PO cardizem 24hr 180mg  bid with rate control which is an increase from 180 and 120 at home.  Holding off on restarting ARB.  BP controlled at this time.  CHF symptoms improved after 40mg  of IV lasix.  She was given another PO dose of 20mg  the follow ing day.  It was thought the [facility] was exacerbated by the afib RVR.  She ambulated well prior to discharge with RA O2 sats of 94%.  Hyperkalemia resolved. The patient was clear she did not want any invasive procedures. The patient was seen by Dr. Marlou Porch who felt she was stable for DC home.   Consults: None  Significant Diagnostic Studies:  PORTABLE CHEST - 1 VIEW  COMPARISON: Portable chest x-ray of June 25, 2014  FINDINGS:  The lungs are well-expanded. The pulmonary interstitial markings are  increased. The pulmonary vascularity is mildly engorged. The  cardiopericardial silhouette is enlarged but stable. There is no  pleural effusion. The bony thorax is unremarkable.  IMPRESSION:  CHF with mild pulmonary interstitial edema.   BMET    Component Value Date/Time   NA 142 08/20/2014 0350   K 3.7 08/20/2014 0350   CL 103 08/20/2014 0350   CO2 26 08/20/2014 0350   GLUCOSE 104* 08/20/2014 0350   BUN 14 08/20/2014 0350   CREATININE 0.83  08/20/2014 0350   CREATININE 0.75 03/07/2014 1619   CALCIUM 8.6 08/20/2014 0350   GFRNONAA 60* 08/20/2014 0350   GFRAA 70* 08/20/2014 0350     Treatments: See above  Discharge Exam: Blood pressure 129/76, pulse 69, temperature 97.8 F (36.6 C), temperature source Oral, resp. rate 18, height 4\' 10"  (1.473 m), weight 131 lb 11.2 oz (59.739 kg), SpO2 100.00%.   Disposition: 01-Home or Self Care      Discharge Instructions   Diet - low sodium heart healthy    Complete by:  As directed      Increase activity slowly    Complete by:  As directed             Medication List    STOP taking these medications       losartan 50 MG tablet  Commonly known as:  COZAAR      TAKE these medications       aspirin 81 MG chewable tablet  Chew 1 tablet (81 mg total) by mouth daily.     BESIVANCE 0.6 % Susp  Generic drug:  Besifloxacin HCl  Place 1 drop into both eyes 2 (two) times daily.     diltiazem 180 MG 24 hr capsule  Commonly known as:  CARDIZEM CD  Take 1 capsule (180 mg total) by mouth 2 (two) times daily.     docusate sodium 100 MG capsule  Commonly known as:  COLACE  Take 1 capsule (100 mg total) by mouth 2 (two) times daily.     fish oil-omega-3 fatty acids 1000 MG capsule  Take 1,000 mg by mouth daily.     furosemide 20 MG tablet  Commonly known as:  LASIX  Take 20 mg by mouth daily. Take an extra tab daily as needed for fluid (if weight gain >3lbs).     HYDROcodone-acetaminophen 5-325 MG per tablet  Commonly known as:  NORCO/VICODIN  Take 0.5-1 tablets by mouth 2 (two) times daily as needed for moderate pain.     levothyroxine 25 MCG tablet  Commonly known as:  SYNTHROID, LEVOTHROID  Take 25 mcg by mouth every morning.     OCUVITE PRESERVISION Tabs  Take 1 tablet by mouth 2 (two) times daily.     omeprazole 40 MG capsule  Commonly known as:  PRILOSEC  Take 40 mg by mouth daily.     zolpidem 5 MG tablet  Commonly known as:  AMBIEN  Take 1 tablet (5 mg  total) by mouth at bedtime as needed for sleep.       Follow-up Information   Follow up with [facility], NP On 09/10/2014. (2:00 PM)    Specialty:  Cardiology   Contact information:   [address] [place] [address] [phone]      Greater than 30 minutes was spent completing the  patient's discharge.    SignedTarri Fuller, [place] 08/20/2014, 12:41 PM

## 2014-08-20 NOTE — Discharge Summary (Signed)
Personally seen and examined. Agree with above. She feels comfortable now and would like to go home. Seems reasonable with increased rate control.  Candee Furbish, MD

## 2014-08-20 NOTE — Progress Notes (Signed)
SATURATION QUALIFICATIONS: (This note is used to comply with regulatory documentation for home oxygen)  Patient Saturations on Room Air at Rest = 97%  Patient Saturations on Room Air while Ambulating = 94%  Patient Saturations on Room Air while Ambulating = 94%  Please briefly explain why patient needs home oxygen:  Pt tolerated walk well.

## 2014-08-21 ENCOUNTER — Telehealth: Payer: Self-pay

## 2014-08-21 MED ORDER — ZOLPIDEM TARTRATE 5 MG PO TABS: 5.0000 mg | ORAL_TABLET | Freq: Every evening | ORAL | Status: DC | PRN

## 2014-08-21 NOTE — Telephone Encounter (Signed)
Called [employer] and request that prior authorization paper work be faxed over.

## 2014-08-21 NOTE — Telephone Encounter (Signed)
Legrand Como pt's son came by office;on 07/18/14 [facility] only sent pt # 30 instead of # 58. Spoke with Gerald Stabs at [employer] and was advised pt has quantity limits on zolpidem. Gerald Stabs could not send quantity exception request but advised our office could call prior auth dept at [phone] and that would initiate a quantity exception request.  Pt is out of med and request # 30 sent to [facility]. Will sent quantity exception to Dr Josefine Class CMA or nurse and will send refill request to Dr Damita Dunnings. Michael request cb when quantity exception approved.

## 2014-08-21 NOTE — Telephone Encounter (Signed)
Please call in locally.  Thanks.

## 2014-08-22 NOTE — Telephone Encounter (Signed)
Completed form faxed to [facility]

## 2014-08-22 NOTE — Telephone Encounter (Signed)
Rx called to [facility] as instructed. Prior authorization paper work is on your desk for completion.

## 2014-08-22 NOTE — Telephone Encounter (Signed)
Form done. Thanks. 

## 2014-09-06 ENCOUNTER — Telehealth: Payer: Self-pay

## 2014-09-06 NOTE — Telephone Encounter (Signed)
Opened in error

## 2014-09-06 NOTE — Telephone Encounter (Signed)
Coverage was denied--i called coverage review to answer additional questions and Ambien was approved for nightly use meaning #30 per 30 days--approved through 09/06/15 from [facility] case id [id number]

## 2014-09-10 ENCOUNTER — Encounter: Payer: Self-pay | Admitting: Cardiology

## 2014-09-10 ENCOUNTER — Ambulatory Visit (INDEPENDENT_AMBULATORY_CARE_PROVIDER_SITE_OTHER): Payer: Medicare Other | Admitting: Cardiology

## 2014-09-10 VITALS — BP 150/54 | HR 75 | Ht <= 58 in | Wt 135.1 lb

## 2014-09-10 DIAGNOSIS — I2510 Atherosclerotic heart disease of native coronary artery without angina pectoris: Secondary | ICD-10-CM

## 2014-09-10 DIAGNOSIS — I5032 Chronic diastolic (congestive) heart failure: Secondary | ICD-10-CM

## 2014-09-10 DIAGNOSIS — I340 Nonrheumatic mitral (valve) insufficiency: Secondary | ICD-10-CM

## 2014-09-10 DIAGNOSIS — I10 Essential (primary) hypertension: Secondary | ICD-10-CM

## 2014-09-10 DIAGNOSIS — I480 Paroxysmal atrial fibrillation: Secondary | ICD-10-CM

## 2014-09-10 NOTE — Assessment & Plan Note (Signed)
BP elevated she stated she was taking ARB but will call and verify this.  It was held in the hospital.  If BP stays elevated she will call us as well.  She did not wish to go on another med or increase med currently because of feeling sick after doing this in the past.

## 2014-09-10 NOTE — Progress Notes (Addendum)
09/10/2014   PCP: Elsie Stain, MD   Chief Complaint  Patient presents with  . Follow-up    pt denies chest pain and swelling. pt states that she does experience sob    Primary Cardiologist:Dr. P. Martinique  HPI:   80y.o. female with a past medical history significant for HOCM, CAD with past NSTEMI in 03/2013 in the setting of rapid AF - has high grade stenosis in the first diagonal branch (moderate vessel in length but small in caliber) and a 70% stenosis in the 2nd DX - she was managed medically, PAF, diastolic HF, valvular heart disease with MR and advanced age. EF is normal by echo from April of 2014 but with grade II diastolic dysfunction, mild AS, mild MR and mild MS. She is intolerant to amiodarone and not a candidate for Tikosyn due to prolonged QT - not a candidate for anticoagulation given past history of bleeding and falls. Admitted back in March with an acute onset of shortness of breath and hypoxia. Treated with oxygen, CPAP and diuresis. Echo updated - moderate to severe MR with a mobile density on the mitral valve - no fevers and not felt to be endocarditis - she did not wish to pursue cardiac surgery.   On follow up with Dr. Damita Dunnings she had her HCTZ stopped due to hyponatremia and placed on lasix. He started low dose beta blocker. She ended up not tolerating this - was dizzy and "horribly sick". Subsequently place on diltiazem. When seen by Dr. Damita Dunnings in May HR 117. Diltiazem increased to 180 mg bid. This was later reduced to 180 mg in the am and 120 mg in the pm due to side effects. Recently seen in the office by Dr. Martinique in follow-up. She states she has really felt bad for the past 2 weeks. Complains of palpitations. Has SOB and low energy. Legs cramp and are restless. She complains of itching. She has been monitoring weight and it has been stable. She rarely takes extra Lasix. HR was in the low 100's.   Presented to the ER 08/19/14 due to progressive weakness and  palpitations. Noted to be in her chronic if not permanent a-fib with RVR in the 120-130 range. Placed on IV cardizem with improved rate control, down to the 70's-80's. She stated that she felt a little better. She also said in the ER that her quality of life is poor, that she doesn't want aggressive measures and would be happy if she just "died". She denied, however, any suicidal or homicidal ideations.  She was admitted and changed over from IV to PO cardizem 24hr 180mg  bid with rate control which is an increase from 180 and 120 at home. BP controlled at this time. CHF symptoms improved after 40mg  of IV lasix.   Today she is here for follow up post hospital -she states she feels better.  She had continued to feel about the same but over last 2 days she does feel better.  EKG today with SR rate of 75.  She denies chest pain.  Some SOB.    Allergies  Allergen Reactions  . Lipitor [Atorvastatin] Nausea Only and Other (See Comments)    LFT elevation  . Morphine And Related Other (See Comments)    "drives me crazy" and hyperactivity  . Irbesartan Swelling  . Ramipril Swelling    REACTION: lips swelling  . Telmisartan-Hctz Other (See Comments)    REACTION: incontinence  . Amiodarone        .  Hydrochlorothiazide Other (See Comments)    hyponatremia  . Metoprolol Other (See Comments)    Headache, dizzy, "terribly sick"  . Other Other (See Comments)    ANTICOAGULANTS - not a candidate due to history of falls and bleeding  . Tikosyn [Dofetilide] Other (See Comments)    Not a candidate due to Prolonged QT  . Norpace [Disopyramide] Other (See Comments)    Dry mouth    Current Outpatient Prescriptions  Medication Sig Dispense Refill  . aspirin 81 MG chewable tablet Chew 1 tablet (81 mg total) by mouth daily.      Marland Kitchen BESIVANCE 0.6 % SUSP Place 1 drop into both eyes 2 (two) times daily.       Marland Kitchen diltiazem (CARDIZEM CD) 180 MG 24 hr capsule Take 1 capsule (180 mg total) by mouth 2 (two) times daily.   60 capsule  5  . docusate sodium (COLACE) 100 MG capsule Take 1 capsule (100 mg total) by mouth 2 (two) times daily.  60 capsule  0  . fish oil-omega-3 fatty acids 1000 MG capsule Take 1,000 mg by mouth daily.       . furosemide (LASIX) 20 MG tablet Take 20 mg by mouth daily. Take an extra tab daily as needed for fluid (if weight gain >3lbs).      Marland Kitchen HYDROcodone-acetaminophen (NORCO/VICODIN) 5-325 MG per tablet Take 0.5-1 tablets by mouth 2 (two) times daily as needed for moderate pain.      Marland Kitchen levothyroxine (SYNTHROID, LEVOTHROID) 25 MCG tablet Take 25 mcg by mouth every morning.      Marland Kitchen losartan (COZAAR) 50 MG tablet       . Multiple Vitamins-Minerals (OCUVITE PRESERVISION) TABS Take 1 tablet by mouth 2 (two) times daily.       Marland Kitchen omeprazole (PRILOSEC) 40 MG capsule Take 40 mg by mouth daily.      Marland Kitchen zolpidem (AMBIEN) 5 MG tablet Take 1 tablet (5 mg total) by mouth at bedtime as needed for sleep.  30 tablet  1   No current facility-administered medications for this visit.    Past Medical History  Diagnosis Date  . Atrial fibrillation 04/23-24/2007    a. recurrent PAF with RVR in September 2013. b. Evaluated 03/2013, previously intolerant to Norpace and Amiodarone - consider Multaq if recurs. c. Not on anticoag due to history of falls and also some internal bleeding per son.  . Hypertrophic cardiomyopathy   . Urinary incontinence   . Diverticulosis of colon (without mention of hemorrhage) 2003/ 08/2000    EGD/colonoscopy Barretts esophagus//H.H divertics 08/2000  . Cervical mass     C2 lateral mass fracture  . Hypertension   . Hypercholesterolemia     219/497  . Hypothyroidism   . Multinodular goiter (nontoxic)   . Osteoporosis   . Blood transfusion   . Jaundice ~ 1935    "in grade school"  . Degenerative joint disease     back  . Depression     "husband died Feb 24, 2011"  . Anemia, iron deficiency   . Personal history of colonic polyps 02/29/2012    tubular adenoma  . Barrett's  esophagus   . CAD (coronary artery disease)     a. NSTEMI 03/2013: secondary to diagonal disease (small, not amenable to PCI, for med rx).  . Moderate mitral regurgitation 2014  . Mild aortic stenosis 2014  . Lung nodule seen on imaging study, pt does not wish further work up 02/08/2014  . Mitral valve regurgitation, mod to severe  with moderately calcified annulus 02/08/2014  . Mitral valve mass, density - no fevers to suggest endocarditis 02/08/2014  . HOH (hard of hearing)   . Shortness of breath   . CHF (congestive heart failure)     Past Surgical History  Procedure Laterality Date  . Bladder surgery      bladder tack early 90's  . Tear duct probing  07/29/03    tear duct surg  . Cystourethroscopy  09/17/03  . Rotator cuff repair  ? date; 09/07/05    left; right( Dr. Gladstone Lighter)  . Appendectomy  1941  . Breast surgery  1981    breast reduction  . Eye surgery  03/2002    cataract OS  . Thyroid ultrasound  10/14/2003    MNG, no dominant masses  . Doppler echocardiography  03/05/2002&09/11/2003    ECHO, EF wnl, mild stenosis, A.S. mild MR, Mild T.R03/31/2003//ECHO EF 70%,LVH, ?diast dysfunction 09/11/2003  . Cataract extraction w/ intraocular lens  implant, bilateral  2003  . Dilation and curettage of uterus  09/07/2000    endometrial polyps removed, path all benign   . Tonsillectomy and adenoidectomy      "as a child"  . Fracture surgery  2010    right knee    OZD:GUYQIHK:VQ colds or fevers,  weight is up a few pounds from hospital discharge Skin:no rashes or ulcers HEENT:no blurred vision, no congestion CV:see HPI PUL:see HPI GI:no diarrhea constipation or melena, no indigestion GU:no hematuria, no dysuria MS:no joint pain, no claudication Neuro:no syncope, no lightheadedness Endo:no diabetes, + thyroid disease  Wt Readings from Last 3 Encounters:  09/10/14 135 lb 1.6 oz (61.281 kg)  08/20/14 131 lb 11.2 oz (59.739 kg)  08/15/14 135 lb (61.236 kg)    PHYSICAL EXAM BP  150/54  Pulse 75  Ht 4\' 9"  (1.448 m)  Wt 135 lb 1.6 oz (61.281 kg)  BMI 29.23 kg/m2 General:Pleasant affect, NAD Skin:Warm and dry, brisk capillary refill HEENT:normocephalic, sclera clear, mucus membranes moist, hard of hearing Neck:supple, no JVD, no bruits sitting up in chair, no adenopathy Heart:S1S2 RRR with 2/6 systolic murmur, no gallup, rub or click Lungs: with rales bibasilar, no rhonchi, or wheezes QVZ:DGLO, non tender, + BS, do not palpate liver spleen or masses Ext:no lower ext edema,  2+ radial pulses Neuro:alert and oriented X 3, MAE, follows commands, + facial symmetry  EKG:SR rate 75 with PR 192 ms no changes except now in SR   ASSESSMENT AND PLAN PAF (paroxysmal atrial fibrillation) Now in SR possibly converted at home last 2 days, she has felt better over last 2 days.  No anticoagulation secondary to hx GI bleed and risk of falls.   Chronic diastolic CHF (congestive heart failure) Wt is up today and rales in bilateral bases.  She did take extra half dose of lasix 3 days ago but will have repeat this in the AM.  She will take extra lasix if wt is up > 3 pounds and if continues to increase she will call.   She will follow up with Dr. P. Martinique in 6 weeks.  Essential hypertension BP elevated she stated she was taking ARB but will call and verify this.  It was held in the hospital.  If BP stays elevated she will call us as well.  She did not wish to go on another med or increase med currently because of feeling sick after doing this in the past.   Mitral valve regurgitation, mod to severe with moderately calcified  annulus stable

## 2014-09-10 NOTE — Patient Instructions (Signed)
Take Lasix 20 mg daily.Take a extra Lasix 10 mg tomorrow morning.   Your physician recommends that you schedule a follow-up appointment in: 6 weeks with Dr.Jordan  Friday 10/25/14 at 1:30 pm

## 2014-09-10 NOTE — Assessment & Plan Note (Signed)
stable °

## 2014-09-10 NOTE — Assessment & Plan Note (Signed)
Now in SR possibly converted at home last 2 days, she has felt better over last 2 days.  No anticoagulation secondary to hx GI bleed and risk of falls.

## 2014-09-10 NOTE — Assessment & Plan Note (Signed)
Wt is up today and rales in bilateral bases.  She did take extra half dose of lasix 3 days ago but will have repeat this in the AM.  She will take extra lasix if wt is up > 3 pounds and if continues to increase she will call.   She will follow up with Dr. P. Martinique in 6 weeks.

## 2014-09-16 ENCOUNTER — Emergency Department (HOSPITAL_COMMUNITY): Payer: Medicare Other

## 2014-09-16 ENCOUNTER — Encounter (HOSPITAL_COMMUNITY): Payer: Self-pay | Admitting: Emergency Medicine

## 2014-09-16 ENCOUNTER — Inpatient Hospital Stay (HOSPITAL_COMMUNITY)
Admission: EM | Admit: 2014-09-16 | Discharge: 2014-09-18 | DRG: 308 | Disposition: A | Discharge status: Home / Self Care | Payer: Medicare Other | Attending: Cardiovascular Disease | Admitting: Cardiovascular Disease

## 2014-09-16 ENCOUNTER — Telehealth: Payer: Self-pay | Admitting: Family Medicine

## 2014-09-16 DIAGNOSIS — I2489 Other forms of acute ischemic heart disease: Secondary | ICD-10-CM | POA: Diagnosis present

## 2014-09-16 DIAGNOSIS — I2510 Atherosclerotic heart disease of native coronary artery without angina pectoris: Secondary | ICD-10-CM | POA: Diagnosis present

## 2014-09-16 DIAGNOSIS — I252 Old myocardial infarction: Secondary | ICD-10-CM | POA: Diagnosis not present

## 2014-09-16 DIAGNOSIS — H9190 Unspecified hearing loss, unspecified ear: Secondary | ICD-10-CM | POA: Diagnosis present

## 2014-09-16 DIAGNOSIS — E780 Pure hypercholesterolemia: Secondary | ICD-10-CM | POA: Diagnosis present

## 2014-09-16 DIAGNOSIS — M810 Age-related osteoporosis without current pathological fracture: Secondary | ICD-10-CM | POA: Diagnosis present

## 2014-09-16 DIAGNOSIS — R0600 Dyspnea, unspecified: Secondary | ICD-10-CM

## 2014-09-16 DIAGNOSIS — E039 Hypothyroidism, unspecified: Secondary | ICD-10-CM | POA: Diagnosis present

## 2014-09-16 DIAGNOSIS — I248 Other forms of acute ischemic heart disease: Secondary | ICD-10-CM | POA: Diagnosis present

## 2014-09-16 DIAGNOSIS — I5033 Acute on chronic diastolic (congestive) heart failure: Secondary | ICD-10-CM | POA: Diagnosis present

## 2014-09-16 DIAGNOSIS — I341 Nonrheumatic mitral (valve) prolapse: Secondary | ICD-10-CM

## 2014-09-16 DIAGNOSIS — Z87891 Personal history of nicotine dependence: Secondary | ICD-10-CM

## 2014-09-16 DIAGNOSIS — G479 Sleep disorder, unspecified: Secondary | ICD-10-CM

## 2014-09-16 DIAGNOSIS — Z6829 Body mass index (BMI) 29.0-29.9, adult: Secondary | ICD-10-CM | POA: Diagnosis not present

## 2014-09-16 DIAGNOSIS — I10 Essential (primary) hypertension: Secondary | ICD-10-CM | POA: Diagnosis present

## 2014-09-16 DIAGNOSIS — I421 Obstructive hypertrophic cardiomyopathy: Secondary | ICD-10-CM | POA: Diagnosis present

## 2014-09-16 DIAGNOSIS — I2583 Coronary atherosclerosis due to lipid rich plaque: Secondary | ICD-10-CM

## 2014-09-16 DIAGNOSIS — I4891 Unspecified atrial fibrillation: Secondary | ICD-10-CM

## 2014-09-16 DIAGNOSIS — I480 Paroxysmal atrial fibrillation: Principal | ICD-10-CM | POA: Diagnosis present

## 2014-09-16 DIAGNOSIS — I340 Nonrheumatic mitral (valve) insufficiency: Secondary | ICD-10-CM | POA: Diagnosis present

## 2014-09-16 LAB — COMPREHENSIVE METABOLIC PANEL
ALT: 10 U/L (ref 0–35)
AST: 17 U/L (ref 0–37)
Albumin: 3.3 g/dL — ABNORMAL LOW (ref 3.5–5.2)
Alkaline Phosphatase: 79 U/L (ref 39–117)
Anion gap: 17 — ABNORMAL HIGH (ref 5–15)
BUN: 13 mg/dL (ref 6–23)
CALCIUM: 8.8 mg/dL (ref 8.4–10.5)
CO2: 21 meq/L (ref 19–32)
CREATININE: 0.81 mg/dL (ref 0.50–1.10)
Chloride: 100 mEq/L (ref 96–112)
GFR calc Af Amer: 72 mL/min — ABNORMAL LOW (ref >90)
GFR calc non Af Amer: 62 mL/min — ABNORMAL LOW (ref >90)
Glucose, Bld: 101 mg/dL — ABNORMAL HIGH (ref 70–99)
Potassium: 4 mEq/L (ref 3.7–5.3)
Sodium: 138 mEq/L (ref 137–147)
Total Bilirubin: 0.3 mg/dL (ref 0.3–1.2)
Total Protein: 6.7 g/dL (ref 6.0–8.3)

## 2014-09-16 LAB — TROPONIN I
Troponin I: <0.3 ng/mL (ref <0.30)
Troponin I: 0.35 ng/mL (ref <0.30)

## 2014-09-16 LAB — CBC
HEMATOCRIT: 35.5 % — AB (ref 36.0–46.0)
Hemoglobin: 10.5 g/dL — ABNORMAL LOW (ref 12.0–15.0)
MCH: 22.2 pg — AB (ref 26.0–34.0)
MCHC: 29.6 g/dL — ABNORMAL LOW (ref 30.0–36.0)
MCV: 74.9 fL — ABNORMAL LOW (ref 78.0–100.0)
PLATELETS: 269 10*3/uL (ref 150–400)
RBC: 4.74 MIL/uL (ref 3.87–5.11)
RDW: 16.8 % — AB (ref 11.5–15.5)
WBC: 6.3 10*3/uL (ref 4.0–10.5)

## 2014-09-16 LAB — PRO B NATRIURETIC PEPTIDE: Pro B Natriuretic peptide (BNP): 2382 pg/mL — ABNORMAL HIGH (ref 0–450)

## 2014-09-16 LAB — TSH: TSH: 1.01 u[IU]/mL (ref 0.350–4.500)

## 2014-09-16 LAB — MAGNESIUM: MAGNESIUM: 2.1 mg/dL (ref 1.5–2.5)

## 2014-09-16 MED ORDER — ONDANSETRON HCL 4 MG/2ML IJ SOLN: 4.0000 mg | Freq: Four times a day (QID) | INTRAMUSCULAR | Status: DC | PRN

## 2014-09-16 MED ORDER — DILTIAZEM HCL ER COATED BEADS 180 MG PO CP24
180.0000 mg | ORAL_CAPSULE | Freq: Two times a day (BID) | ORAL | Status: DC
  Administered 2014-09-16 – 2014-09-18 (×4): 180 mg via ORAL
  Filled 2014-09-16 (×6): qty 1

## 2014-09-16 MED ORDER — NEBIVOLOL HCL 2.5 MG PO TABS
2.5000 mg | ORAL_TABLET | Freq: Every day | ORAL | Status: DC
  Administered 2014-09-16 – 2014-09-18 (×3): 2.5 mg via ORAL
  Filled 2014-09-16 (×3): qty 1

## 2014-09-16 MED ORDER — ACETAMINOPHEN 325 MG PO TABS: 650.0000 mg | ORAL_TABLET | ORAL | Status: DC | PRN

## 2014-09-16 MED ORDER — OMEGA-3-ACID ETHYL ESTERS 1 G PO CAPS
1.0000 g | ORAL_CAPSULE | Freq: Every day | ORAL | Status: DC
  Administered 2014-09-17 – 2014-09-18 (×2): 1 g via ORAL
  Filled 2014-09-16 (×2): qty 1

## 2014-09-16 MED ORDER — OCUVITE PRESERVISION PO TABS: 1.0000 | ORAL_TABLET | Freq: Two times a day (BID) | ORAL | Status: DC

## 2014-09-16 MED ORDER — LEVOTHYROXINE SODIUM 25 MCG PO TABS
25.0000 ug | ORAL_TABLET | Freq: Every day | ORAL | Status: DC
  Administered 2014-09-17 – 2014-09-18 (×2): 25 ug via ORAL
  Filled 2014-09-16 (×3): qty 1

## 2014-09-16 MED ORDER — OCUVITE-LUTEIN PO CAPS
1.0000 | ORAL_CAPSULE | Freq: Two times a day (BID) | ORAL | Status: DC
  Administered 2014-09-16 – 2014-09-18 (×4): 1 via ORAL
  Filled 2014-09-16 (×6): qty 1

## 2014-09-16 MED ORDER — FUROSEMIDE 10 MG/ML IJ SOLN
20.0000 mg | Freq: Once | INTRAMUSCULAR | Status: AC
  Administered 2014-09-16: 20 mg via INTRAVENOUS
  Filled 2014-09-16: qty 2

## 2014-09-16 MED ORDER — HEPARIN SODIUM (PORCINE) 5000 UNIT/ML IJ SOLN
5000.0000 [IU] | Freq: Three times a day (TID) | INTRAMUSCULAR | Status: DC
  Administered 2014-09-16 – 2014-09-18 (×5): 5000 [IU] via SUBCUTANEOUS
  Filled 2014-09-16 (×7): qty 1

## 2014-09-16 MED ORDER — OMEGA-3 FATTY ACIDS 1000 MG PO CAPS: 1000.0000 mg | ORAL_CAPSULE | Freq: Every day | ORAL | Status: DC

## 2014-09-16 MED ORDER — HYDROCODONE-ACETAMINOPHEN 5-325 MG PO TABS
0.5000 | ORAL_TABLET | Freq: Two times a day (BID) | ORAL | Status: DC | PRN
  Administered 2014-09-16: 1 via ORAL
  Filled 2014-09-16: qty 1

## 2014-09-16 MED ORDER — ZOLPIDEM TARTRATE 5 MG PO TABS
5.0000 mg | ORAL_TABLET | Freq: Every evening | ORAL | Status: DC | PRN
  Administered 2014-09-16 – 2014-09-17 (×2): 5 mg via ORAL
  Filled 2014-09-16 (×2): qty 1

## 2014-09-16 MED ORDER — DILTIAZEM HCL 25 MG/5ML IV SOLN
10.0000 mg | Freq: Once | INTRAVENOUS | Status: AC
  Administered 2014-09-16: 10 mg via INTRAVENOUS
  Filled 2014-09-16: qty 5

## 2014-09-16 MED ORDER — DOCUSATE SODIUM 100 MG PO CAPS
100.0000 mg | ORAL_CAPSULE | Freq: Every day | ORAL | Status: DC | PRN
  Filled 2014-09-16: qty 1

## 2014-09-16 MED ORDER — GATIFLOXACIN 0.5 % OP SOLN
1.0000 [drp] | Freq: Two times a day (BID) | OPHTHALMIC | Status: DC
  Administered 2014-09-16 – 2014-09-18 (×4): 1 [drp] via OPHTHALMIC
  Filled 2014-09-16: qty 2.5

## 2014-09-16 MED ORDER — PANTOPRAZOLE SODIUM 40 MG PO TBEC
40.0000 mg | DELAYED_RELEASE_TABLET | Freq: Every day | ORAL | Status: DC
  Administered 2014-09-17 – 2014-09-18 (×2): 40 mg via ORAL
  Filled 2014-09-16: qty 1

## 2014-09-16 MED ORDER — ASPIRIN 81 MG PO CHEW
81.0000 mg | CHEWABLE_TABLET | Freq: Every day | ORAL | Status: DC
  Administered 2014-09-17 – 2014-09-18 (×2): 81 mg via ORAL
  Filled 2014-09-16: qty 1

## 2014-09-16 MED ORDER — LOSARTAN POTASSIUM 50 MG PO TABS
50.0000 mg | ORAL_TABLET | Freq: Every day | ORAL | Status: DC
  Administered 2014-09-17 – 2014-09-18 (×2): 50 mg via ORAL
  Filled 2014-09-16 (×2): qty 1

## 2014-09-16 MED ORDER — SODIUM CHLORIDE 0.9 % IV SOLN: INTRAVENOUS | Status: DC

## 2014-09-16 MED ORDER — FUROSEMIDE 20 MG PO TABS
10.0000 mg | ORAL_TABLET | Freq: Every day | ORAL | Status: DC
  Administered 2014-09-17 – 2014-09-18 (×2): 10 mg via ORAL
  Filled 2014-09-16 (×2): qty 0.5

## 2014-09-16 MED ORDER — SODIUM CHLORIDE 0.9 % IV SOLN
INTRAVENOUS | Status: DC
  Administered 2014-09-16: 20 mL/h via INTRAVENOUS

## 2014-09-16 NOTE — Telephone Encounter (Signed)
Per Hulen Skains (Son) Patient is currently being seen by the Dr. In the E.R. The plan right now is to admit the patient for overnight observation. A dose of Lasix has been given at this time. Lulubelle Simcoe is with the patient in the E.R. Right now.

## 2014-09-16 NOTE — ED Notes (Signed)
Patient states she has been having increased sob x 3 weeks with associated chest tightness, patient states over this past weekend she began to have worsening symptoms and had to call EMS today

## 2014-09-16 NOTE — ED Provider Notes (Signed)
CSN: [id number]     Arrival date & time 09/16/14  1256 History   First MD Initiated Contact with Patient 09/16/14 1305     Chief Complaint  Patient presents with  . Chest Pain    patient states tightness      (Consider location/radiation/quality/duration/timing/severity/associated sxs/prior Treatment) Patient is a 80 y.o. female presenting with chest pain. The history is provided by the patient and a relative.  Chest Pain Associated symptoms: shortness of breath   Associated symptoms: no abdominal pain, no back pain, no fever, no headache and not vomiting   pt w hx afib, c/o rapid heart beat and sob in the past few days. Family/caregiver checks pts vitals, and in past couple days hr in range 120-150.  Pt w sob w minimal exertion, worsening doe. States chest feels tight, although no discrete episodes cp. No nv or diaphoresis. Is eating/drinking, and urinating of normal amount. No leg swelling. No orthopnea. occ non prod cough. No fever or chills. States compliant w normal meds, no recent change in meds. Denies wt gain, or loss.     Past Medical History  Diagnosis Date  . Atrial fibrillation 04/23-24/2007    a. recurrent PAF with RVR in September 2013. b. Evaluated 03/2013, previously intolerant to Norpace and Amiodarone - consider Multaq if recurs. c. Not on anticoag due to history of falls and also some internal bleeding per son.  . Hypertrophic cardiomyopathy   . Urinary incontinence   . Diverticulosis of colon (without mention of hemorrhage) 2003/ 08/2000    EGD/colonoscopy Barretts esophagus//H.H divertics 08/2000  . Cervical mass     C2 lateral mass fracture  . Hypertension   . Hypercholesterolemia     219/497  . Hypothyroidism   . Multinodular goiter (nontoxic)   . Osteoporosis   . Blood transfusion   . Jaundice ~ 1935    "in grade school"  . Degenerative joint disease     back  . Depression     "husband died Feb 10, 2011"  . Anemia, iron deficiency   . Personal history of  colonic polyps 02/29/2012    tubular adenoma  . Barrett's esophagus   . CAD (coronary artery disease)     a. NSTEMI 03/2013: secondary to diagonal disease (small, not amenable to PCI, for med rx).  . Moderate mitral regurgitation 2014  . Mild aortic stenosis 2014  . Lung nodule seen on imaging study, pt does not wish further work up 02/08/2014  . Mitral valve regurgitation, mod to severe with moderately calcified annulus 02/08/2014  . Mitral valve mass, density - no fevers to suggest endocarditis 02/08/2014  . HOH (hard of hearing)   . Shortness of breath   . CHF (congestive heart failure)    Past Surgical History  Procedure Laterality Date  . Bladder surgery      bladder tack early 90's  . Tear duct probing  07/29/03    tear duct surg  . Cystourethroscopy  09/17/03  . Rotator cuff repair  ? date; 09/07/05    left; right( Dr. Gladstone Lighter)  . Appendectomy  1941  . Breast surgery  1981    breast reduction  . Eye surgery  03/2002    cataract OS  . Thyroid ultrasound  10/14/2003    MNG, no dominant masses  . Doppler echocardiography  03/05/2002&09/11/2003    ECHO, EF wnl, mild stenosis, A.S. mild MR, Mild T.R03/31/2003//ECHO EF 70%,LVH, ?diast dysfunction 09/11/2003  . Cataract extraction w/ intraocular lens  implant, bilateral  2003  .  Dilation and curettage of uterus  09/07/2000    endometrial polyps removed, path all benign   . Tonsillectomy and adenoidectomy      "as a child"  . Fracture surgery  2010    right knee   Family History  Problem Relation Age of Onset  . Heart failure Mother     CHF, DM, HBP  . Hypertension Mother   . Uterine cancer Mother   . Stroke Mother   . Colon cancer Neg Hx   . Esophageal cancer Neg Hx   . Rectal cancer Neg Hx   . Stomach cancer Neg Hx    History  Substance Use Topics  . Smoking status: Former Smoker -- 0.50 packs/day for 4 years    Types: Cigarettes    Quit date: 07/06/1974  . Smokeless tobacco: Never Used  . Alcohol Use: No   OB  History   Grav Para Term Preterm Abortions TAB SAB Ect Mult Living                 Review of Systems  Constitutional: Negative for fever and chills.  HENT: Negative for sore throat.   Eyes: Negative for redness.  Respiratory: Positive for shortness of breath.   Cardiovascular: Positive for chest pain. Negative for leg swelling.  Gastrointestinal: Negative for vomiting, abdominal pain and diarrhea.  Endocrine: Negative for polyuria.  Genitourinary: Negative for flank pain.  Musculoskeletal: Negative for back pain and neck pain.  Skin: Negative for rash.  Neurological: Negative for headaches.  Hematological: Does not bruise/bleed easily.  Psychiatric/Behavioral: Negative for confusion.      Allergies  Lipitor; Morphine and related; Irbesartan; Ramipril; Telmisartan-hctz; Amiodarone; Hydrochlorothiazide; Metoprolol; Other; Tikosyn; and Norpace  Home Medications   Prior to Admission medications   Medication Sig Start Date End Date Taking? Authorizing Provider  aspirin 81 MG chewable tablet Chew 1 tablet (81 mg total) by mouth daily. 03/29/13  Yes Dayna N Dunn, PA-C  BESIVANCE 0.6 % SUSP Place 1 drop into both eyes 2 (two) times daily.  12/21/12  Yes Historical Provider, MD  diltiazem (CARDIZEM CD) 180 MG 24 hr capsule Take 1 capsule (180 mg total) by mouth 2 (two) times daily. 08/20/14  Yes Brett Canales, PA-C  docusate sodium (COLACE) 100 MG capsule Take 100 mg by mouth daily as needed for mild constipation.   Yes Historical Provider, MD  fish oil-omega-3 fatty acids 1000 MG capsule Take 1,000 mg by mouth daily.    Yes Historical Provider, MD  furosemide (LASIX) 20 MG tablet Take 20 mg by mouth daily. Take an extra tab daily as needed for fluid (if weight gain >3lbs). 06/26/14  Yes Modena Jansky, MD  HYDROcodone-acetaminophen (NORCO/VICODIN) 5-325 MG per tablet Take 0.5-1 tablets by mouth 2 (two) times daily as needed for moderate pain.   Yes Historical Provider, MD  levothyroxine  (SYNTHROID, LEVOTHROID) 25 MCG tablet Take 25 mcg by mouth every morning.   Yes Historical Provider, MD  losartan (COZAAR) 50 MG tablet Take 50 mg by mouth daily.  09/04/14  Yes Historical Provider, MD  Multiple Vitamins-Minerals (OCUVITE PRESERVISION) TABS Take 1 tablet by mouth 2 (two) times daily.    Yes Historical Provider, MD  omeprazole (PRILOSEC) 40 MG capsule Take 40 mg by mouth daily.   Yes Historical Provider, MD  zolpidem (AMBIEN) 5 MG tablet Take 1 tablet (5 mg total) by mouth at bedtime as needed for sleep. 08/21/14  Yes Tonia Ghent, MD   BP 120/99  Pulse 106  Temp(Src) 97.7 F (36.5 C) (Oral)  Resp 24  Ht 4\' 8"  (1.422 m)  Wt 132 lb (59.875 kg)  BMI 29.61 kg/m2  SpO2 100% Physical Exam  Nursing note and vitals reviewed. Constitutional: She is oriented to person, place, and time. She appears well-developed and well-nourished. No distress.  HENT:  Mouth/Throat: Oropharynx is clear and moist.  Eyes: Conjunctivae are normal. No scleral icterus.  Neck: Neck supple. No JVD present. No tracheal deviation present.  Cardiovascular: Normal rate, regular rhythm and intact distal pulses.  Exam reveals no gallop and no friction rub.   Murmur heard. Pulmonary/Chest: Effort normal. No respiratory distress.  Rales bases.   Abdominal: Soft. Normal appearance and bowel sounds are normal. She exhibits no distension. There is no tenderness.  Genitourinary:  No cva tenderness.   Musculoskeletal: She exhibits no edema and no tenderness.  Neurological: She is alert and oriented to person, place, and time.  Skin: Skin is warm and dry. No rash noted. She is not diaphoretic.  Psychiatric: She has a normal mood and affect.    ED Course  Procedures (including critical care time) Labs Review  Results for orders placed during the hospital encounter of 09/16/14  CBC      Result Value Ref Range   WBC 6.3  4.0 - 10.5 K/uL   RBC 4.74  3.87 - 5.11 MIL/uL   Hemoglobin 10.5 (*) 12.0 - 15.0 g/dL    HCT 35.5 (*) 36.0 - 46.0 %   MCV 74.9 (*) 78.0 - 100.0 fL   MCH 22.2 (*) 26.0 - 34.0 pg   MCHC 29.6 (*) 30.0 - 36.0 g/dL   RDW 16.8 (*) 11.5 - 15.5 %   Platelets 269  150 - 400 K/uL  PRO B NATRIURETIC PEPTIDE      Result Value Ref Range   Pro B Natriuretic peptide (BNP) 2382.0 (*) 0 - 450 pg/mL   Dg Chest 2 View  09/16/2014   CLINICAL DATA:  Chest pain and shortness of breath.  EXAM: CHEST  2 VIEW  COMPARISON:  08/19/2014.  FINDINGS: Cardiomegaly. Calcified tortuous aorta. Mild vascular congestion, no overt failure or focal infiltrates. Hiatal hernia. Severe skeletal osteopenia. Exaggerated kyphosis and moderate thoracic spondylosis without definite acute compression deformity.  IMPRESSION: No active cardiopulmonary disease. Cardiomegaly. Improved aeration compared with previous radiograph demonstrating CHF.   Electronically Signed   By: Rolla Flatten M.D.   On: 09/16/2014 14:54        EKG Interpretation   Date/Time:  Monday September 16 2014 13:15:28 EDT Ventricular Rate:  128 PR Interval:  97 QRS Duration: 113 QT Interval:  345 QTC Calculation: 503 R Axis:   -44 Text Interpretation:  Atrial fibrillation LVH with secondary  repolarization abnormality Non-specific intra-ventricular conduction delay  Prolonged QT interval Confirmed by Ashok Cordia  MD, Lennette Bihari ([id number]) on 09/16/2014  1:25:41 PM      MDM   Iv ns. o2 [place]. Continuous pulse ox and monitor.   Ecg. Cxr.  Reviewed nursing notes and prior charts for additional history.   Recent card office notes reviewed, as well as recent admission to cardiology service.   Labs,xrays pending.  bnp elevated as compared to prior, basilar rales.  Will give lasix 20 mg iv in ED.  Cardiology consulted.  Recheck hr improved.  Pt notes dyspnea mildly improved.     Mirna Mires, MD 09/16/14 [phone]

## 2014-09-16 NOTE — ED Notes (Signed)
Attempted report 

## 2014-09-16 NOTE — Telephone Encounter (Signed)
Noted, please try to get update on patient/condition. Thanks.

## 2014-09-16 NOTE — H&P (Signed)
Cindy [name] is an 80 y.o. female.    Primary Cardiologist: Dr. P. Martinique PCP:  Elsie Stain, MD  Chief Complaint: increasing SOB, this weekend was the worst  HPI: 80y.o. female with a past medical history significant for HOCM, CAD with past NSTEMI in 03/2013 in the setting of rapid AF - has high grade stenosis in the first diagonal branch (moderate vessel in length but small in caliber) and a 70% stenosis in the 2nd DX - she was managed medically, PAF, diastolic HF, valvular heart disease with MR and advanced age. EF is normal by echo from April of 2014 but with grade II diastolic dysfunction, mild AS, mild MR and mild MS. She is intolerant to amiodarone and not a candidate for Tikosyn due to prolonged QT - not a candidate for anticoagulation given past history of bleeding and falls. Admitted back in March with an acute onset of shortness of breath and hypoxia. Treated with oxygen, CPAP and diuresis. Echo updated - moderate to severe MR with a mobile density on the mitral valve - no fevers and not felt to be endocarditis - she did not wish to pursue cardiac surgery.   On follow up with Dr. Damita Dunnings she had her HCTZ stopped due to hyponatremia and placed on lasix. He started low dose beta blocker. She ended up not tolerating this - was dizzy and "horribly sick". Subsequently place on diltiazem. When seen by Dr. Damita Dunnings in May HR 117. Diltiazem increased to 180 mg bid. This was later reduced to 180 mg in the am and 120 mg in the pm due to side effects. Recently seen in the office by Dr. Martinique in follow-up. She states she has really felt bad for the past 2 weeks. Complains of palpitations. Has SOB and low energy. Legs cramp and are restless. She complains of itching. She has been monitoring weight and it has been stable. She rarely takes extra Lasix. HR was in the low 100's.   Presented to the ER 08/19/14 due to progressive weakness and palpitations. Noted to be in her chronic if not  permanent a-fib with RVR in the 120-130 range. Placed on IV cardizem with improved rate control, down to the 70's-80's. She stated that she felt a little better. She also said in the ER that her quality of life is poor, that she doesn't want aggressive measures and would be happy if she just "died". She denied, however, any suicidal or homicidal ideations.  She was admitted and changed over from IV to PO cardizem 24hr 144m bid with rate control which is an increase from 180 and 120 at home. BP controlled at this time. CHF symptoms improved after 476mof IV lasix.   Then I saw her in the office on 09/10/14 and she felt much better, [place] She did have volume overload and we had her take lasix.  Also reviewed her sliding scale lasix with weights.   Today presented to ER after having continued SOB that increased over the weekend..  On arrival she was in a fib with rate of 128.  She has had IV lasix 20 mg and 10 mg IV Cardizem and HR now a fib in the 70s though with activity it climbs back to 128.  She has voided several times.  Her HR went up over the weekend to 120-130 which most likely led to increased SOB.  She may have occ chest tightness but no specific  chest pain.   Her sons and daughter in law with her.   Please note on last visit to [facility] her losartan was held but she has since restarted.   Past Medical History  Diagnosis Date  . Atrial fibrillation 04/23-24/2007    a. recurrent PAF with RVR in September 2013. b. Evaluated 03/2013, previously intolerant to Norpace and Amiodarone - consider Multaq if recurs. c. Not on anticoag due to history of falls and also some internal bleeding per son.  . Hypertrophic cardiomyopathy   . Urinary incontinence   . Diverticulosis of colon (without mention of hemorrhage) 2003/ 08/2000    EGD/colonoscopy Barretts esophagus//H.H divertics 08/2000  . Cervical mass     C2 lateral mass fracture  . Hypertension   . Hypercholesterolemia     219/497  .  Hypothyroidism   . Multinodular goiter (nontoxic)   . Osteoporosis   . Blood transfusion   . Jaundice ~ 1935    "in grade school"  . Degenerative joint disease     back  . Depression     "husband died 02-09-11"  . Anemia, iron deficiency   . Personal history of colonic polyps 02/29/2012    tubular adenoma  . Barrett's esophagus   . CAD (coronary artery disease)     a. NSTEMI 03/2013: secondary to diagonal disease (small, not amenable to PCI, for med rx).  . Moderate mitral regurgitation 2014  . Mild aortic stenosis 2014  . Lung nodule seen on imaging study, pt does not wish further work up 02/08/2014  . Mitral valve regurgitation, mod to severe with moderately calcified annulus 02/08/2014  . Mitral valve mass, density - no fevers to suggest endocarditis 02/08/2014  . HOH (hard of hearing)   . Shortness of breath   . CHF (congestive heart failure)     Past Surgical History  Procedure Laterality Date  . Bladder surgery      bladder tack early 90's  . Tear duct probing  07/29/03    tear duct surg  . Cystourethroscopy  09/17/03  . Rotator cuff repair  ? date; 09/07/05    left; right( Dr. Gladstone Lighter)  . Appendectomy  1941  . Breast surgery  1981    breast reduction  . Eye surgery  03/2002    cataract OS  . Thyroid ultrasound  10/14/2003    MNG, no dominant masses  . Doppler echocardiography  03/05/2002&09/11/2003    ECHO, EF wnl, mild stenosis, A.S. mild MR, Mild T.R03/31/2003//ECHO EF 70%,LVH, ?diast dysfunction 09/11/2003  . Cataract extraction w/ intraocular lens  implant, bilateral  2003  . Dilation and curettage of uterus  09/07/2000    endometrial polyps removed, path all benign   . Tonsillectomy and adenoidectomy      "as a child"  . Fracture surgery  2010    right knee    Family History  Problem Relation Age of Onset  . Heart failure Mother     CHF, DM, HBP  . Hypertension Mother   . Uterine cancer Mother   . Stroke Mother   . Colon cancer Neg Hx   . Esophageal  cancer Neg Hx   . Rectal cancer Neg Hx   . Stomach cancer Neg Hx    Social History:  reports that she quit smoking about 40 years ago. Her smoking use included Cigarettes. She has a 2 pack-year smoking history. She has never used smokeless tobacco. She reports that she does not drink alcohol or use illicit drugs.  Allergies:  Allergies  Allergen Reactions  . Lipitor [Atorvastatin] Nausea Only and Other (See Comments)    LFT elevation  . Morphine And Related Other (See Comments)    "drives me crazy" and hyperactivity  . Irbesartan Swelling  . Ramipril Swelling    REACTION: lips swelling  . Telmisartan-Hctz Other (See Comments)    REACTION: incontinence  . Amiodarone Other (See Comments)     unknown  . Hydrochlorothiazide Other (See Comments)    hyponatremia  . Metoprolol Other (See Comments)    Headache, dizzy, "terribly sick"  . Other Other (See Comments)    ANTICOAGULANTS - not a candidate due to history of falls and bleeding  . Tikosyn [Dofetilide] Other (See Comments)    Not a candidate due to Prolonged QT  . Norpace [Disopyramide] Other (See Comments)    Dry mouth    OUT PATIENT MEDICATIONS:  No current facility-administered medications on file prior to encounter.   Current Outpatient Prescriptions on File Prior to Encounter  Medication Sig Dispense Refill  . aspirin 81 MG chewable tablet Chew 1 tablet (81 mg total) by mouth daily.      Marland Kitchen BESIVANCE 0.6 % SUSP Place 1 drop into both eyes 2 (two) times daily.       Marland Kitchen diltiazem (CARDIZEM CD) 180 MG 24 hr capsule Take 1 capsule (180 mg total) by mouth 2 (two) times daily.  60 capsule  5  . fish oil-omega-3 fatty acids 1000 MG capsule Take 1,000 mg by mouth daily.       . furosemide (LASIX) 20 MG tablet Take 20 mg by mouth daily. Take an extra tab daily as needed for fluid (if weight gain >3lbs).      Marland Kitchen HYDROcodone-acetaminophen (NORCO/VICODIN) 5-325 MG per tablet Take 0.5-1 tablets by mouth 2 (two) times daily as needed for  moderate pain.      Marland Kitchen levothyroxine (SYNTHROID, LEVOTHROID) 25 MCG tablet Take 25 mcg by mouth every morning.      Marland Kitchen losartan (COZAAR) 50 MG tablet Take 50 mg by mouth daily.       . Multiple Vitamins-Minerals (OCUVITE PRESERVISION) TABS Take 1 tablet by mouth 2 (two) times daily.       Marland Kitchen omeprazole (PRILOSEC) 40 MG capsule Take 40 mg by mouth daily.      Marland Kitchen zolpidem (AMBIEN) 5 MG tablet Take 1 tablet (5 mg total) by mouth at bedtime as needed for sleep.  30 tablet  1     Results for orders placed during the hospital encounter of 09/16/14 (from the past 48 hour(s))  CBC     Status: Abnormal   Collection Time    09/16/14  1:39 PM      Result Value Ref Range   WBC 6.3  4.0 - 10.5 K/uL   RBC 4.74  3.87 - 5.11 MIL/uL   Hemoglobin 10.5 (*) 12.0 - 15.0 g/dL   HCT 35.5 (*) 36.0 - 46.0 %   MCV 74.9 (*) 78.0 - 100.0 fL   MCH 22.2 (*) 26.0 - 34.0 pg   MCHC 29.6 (*) 30.0 - 36.0 g/dL   RDW 16.8 (*) 11.5 - 15.5 %   Platelets 269  150 - 400 K/uL  COMPREHENSIVE METABOLIC PANEL     Status: Abnormal   Collection Time    09/16/14  1:39 PM      Result Value Ref Range   Sodium 138  137 - 147 mEq/L   Potassium 4.0  3.7 - 5.3 mEq/L  Chloride 100  96 - 112 mEq/L   CO2 21  19 - 32 mEq/L   Glucose, Bld 101 (*) 70 - 99 mg/dL   BUN 13  6 - 23 mg/dL   Creatinine, Ser 0.81  0.50 - 1.10 mg/dL   Calcium 8.8  8.4 - 10.5 mg/dL   Total Protein 6.7  6.0 - 8.3 g/dL   Albumin 3.3 (*) 3.5 - 5.2 g/dL   AST 17  0 - 37 U/L   ALT 10  0 - 35 U/L   Alkaline Phosphatase 79  39 - 117 U/L   Total Bilirubin 0.3  0.3 - 1.2 mg/dL   GFR calc non Af Amer 62 (*) >90 mL/min   GFR calc Af Amer 72 (*) >90 mL/min   Comment: (NOTE)     The eGFR has been calculated using the CKD EPI equation.     This calculation has not been validated in all clinical situations.     eGFR's persistently <90 mL/min signify possible Chronic Kidney     Disease.   Anion gap 17 (*) 5 - 15  TROPONIN I     Status: None   Collection Time     09/16/14  1:39 PM      Result Value Ref Range   Troponin I <0.30  <0.30 ng/mL   Comment:            Due to the release kinetics of cTnI,     a negative result within the first hours     of the onset of symptoms does not rule out     myocardial infarction with certainty.     If myocardial infarction is still suspected,     repeat the test at appropriate intervals.  PRO B NATRIURETIC PEPTIDE     Status: Abnormal   Collection Time    09/16/14  1:39 PM      Result Value Ref Range   Pro B Natriuretic peptide (BNP) 2382.0 (*) 0 - 450 pg/mL   Dg Chest 2 View  09/16/2014   CLINICAL DATA:  Chest pain and shortness of breath.  EXAM: CHEST  2 VIEW  COMPARISON:  08/19/2014.  FINDINGS: Cardiomegaly. Calcified tortuous aorta. Mild vascular congestion, no overt failure or focal infiltrates. Hiatal hernia. Severe skeletal osteopenia. Exaggerated kyphosis and moderate thoracic spondylosis without definite acute compression deformity.  IMPRESSION: No active cardiopulmonary disease. Cardiomegaly. Improved aeration compared with previous radiograph demonstrating CHF.   Electronically Signed   By: Rolla Flatten M.D.   On: 09/16/2014 14:54    ROS: General:no colds or fevers, no weight changes Skin:no rashes or ulcers HEENT:no blurred vision, no congestion, she is hard of hearing and hes decreased vision.  CV:see HPI PUL:see HPI GI:no diarrhea, constipation or melena, no indigestion GU:no hematuria, no dysuria MS:no joint pain, no claudication Neuro:no syncope, no lightheadedness Endo:no diabetes, + thyroid disease- has been stable.   Blood pressure 132/55, pulse 92, temperature 97.7 F (36.5 C), temperature source Oral, resp. rate 16, height 4' 8"  (1.422 m), weight 132 lb (59.875 kg), SpO2 100.00%. PE: General:Pleasant affect, NAD, though SOB with talking. Skin:Warm and dry, brisk capillary refill HEENT:normocephalic, sclera clear, mucus membranes moist Neck:supple, no JVD, no bruits  Heart:irreg  irreg with 3/6 systolic murmur, no gallup, rub or click Lungs: with rales in basses, no rhonchi, or wheezes KDX:IPJA, non tender, + BS, do not palpate liver spleen or masses Ext:no lower ext edema, 2+ pedal pulses, 2+ radial pulses Neuro:alert and oriented  X 3, MAE, follows commands, + facial symmetry    Assessment/Plan Principal Problem:   Atrial fibrillation with RVR- rate had been controlled and actually she was in SR on the 6 of OCt.  - rate up over the weekend to 120s with increasing SOB.  Admit continue to control HR - she felt bad with metoprolol, ? Add bystolic     Active Problems:   Acute on chronic diastolic congestive heart failure- has rec'd IV lasix and has used bedpan several times.  She is on 10 mg lasix daily, if weight increases she takes another 10 mg and has to void frequently.    CAD (coronary artery disease) is treated medically NSTEMI in 2014, will follow troponins   Mitral valve regurgitation, mod to severe with moderately calcified annulus, ? Mass not felt to be endocarditis, she does not wish to have surgery.    [place] Practitioner Certified [place] Pager [phone] or after 5pm or weekends call [phone] 09/16/2014, 3:50 PM   Attending Note:   The patient was seen and examined.  Agree with assessment and plan as noted above.  Changes made to the above note as needed.  1. Atrial fib:  Reported to be chronic .  Did not tolerate Amio.  Not a candidate for Flecainide ( CAD).  Not a candidate for Tikosyn ( long QT)  Not a candidate for anticoagulation  due to freqnnt falls  Presents to hospital with palpitation and dyspnea.  Has hx of diastolic dysfunction, systolic function is normal by last echo  I think her symptoms are due to rapid Afib and diastolic dysfunction.  I think she will feel better if her rate is better controlled.   She has tried metoprolol in the past but did not tolerate it. Will see if she tolerates  Bystolic  Better. She is elderly but we could try low dose Digoxin if her rate continues to be elevated.  Creatinine is normal.   She does not appear to be all that fluid overloaded so I do not think she needs to be aggressively diuresed.  2. Mitral regurgitation:  Moderate - severe , continue current meds.   3.  Acute on chronic diastolic CHF:  Has received 1 dose of lasix in the ER.   I think she is euvolumic at this point. resume home dose of lasix tomorrow.   4. Hypothyroidism :  Continue synthroid   Thayer Headings, Brooke Bonito., MD, [facility] 09/16/2014, 4:57 PM [address],  [place] Pager [phone]

## 2014-09-16 NOTE — Telephone Encounter (Signed)
Patient Information:  Caller Name: Ronalee Belts  Phone: [phone]  Patient: Cindy, [name]  Gender: Female  DOB: 1922/10/21  Age: 80 Years  PCP: Elsie Stain [facility]) [facility])  Office Follow Up:  Does the office need to follow up with this patient?: No  Instructions For The Office: N/A  RN Note:  Son states patient has history of Atrial Fibrillation. States patient developed a "racing" heartbeat, onset 09/14/14. States heart rate has been from 114-161 on 09/15/14. States rate remains elevated. States patient is complaining of Difficulty breathing. Son is not currently with patient and has limited information for accurate triage assessment. States he is in route to Patient's residence. States Caretaker/[place] is currently with patient at [phone] or [phone]. Call placed to Caretaker/[place] for accurate triage assessment. Caretaker/[place] states patient denies chest pain but is complaining of "fluttering" in her chest. Blood presure 135/78, pulse 133. Patient complains of feeling weak and having heaviness in her head. Care advice given per guidelines. Caretaker advised to call 911 immediately. Caretaker verbalizes understanding and agreeable. Son/Cindy [name] notified of above.  Symptoms  Reason For Call & Symptoms: Rapid, irrregular Heartbeat  Reviewed Health History In EMR: Yes  Reviewed Medications In EMR: Yes  Reviewed Allergies In EMR: Yes  Reviewed Surgeries / Procedures: Yes  Date of Onset of Symptoms: 09/14/2014  Guideline(s) Used:  Chest Pain  Heart Rate and Heartbeat Questions  Disposition Per Guideline:   Call EMS 911 Now  Reason For Disposition Reached:   Sounds like a life-threatening emergency to the triager  Advice Given:  N/A  Patient Will Follow Care Advice:  YES

## 2014-09-17 ENCOUNTER — Encounter (HOSPITAL_COMMUNITY): Payer: Self-pay | Admitting: General Practice

## 2014-09-17 ENCOUNTER — Telehealth: Payer: Self-pay | Admitting: Cardiovascular Disease

## 2014-09-17 DIAGNOSIS — I248 Other forms of acute ischemic heart disease: Secondary | ICD-10-CM

## 2014-09-17 DIAGNOSIS — I2510 Atherosclerotic heart disease of native coronary artery without angina pectoris: Secondary | ICD-10-CM

## 2014-09-17 LAB — BASIC METABOLIC PANEL
ANION GAP: 11 (ref 5–15)
BUN: 13 mg/dL (ref 6–23)
CHLORIDE: 95 meq/L — AB (ref 96–112)
CO2: 26 mEq/L (ref 19–32)
CREATININE: 0.82 mg/dL (ref 0.50–1.10)
Calcium: 8.5 mg/dL (ref 8.4–10.5)
GFR calc Af Amer: 71 mL/min — ABNORMAL LOW (ref >90)
GFR calc non Af Amer: 61 mL/min — ABNORMAL LOW (ref >90)
Glucose, Bld: 104 mg/dL — ABNORMAL HIGH (ref 70–99)
Potassium: 3.7 mEq/L (ref 3.7–5.3)
Sodium: 132 mEq/L — ABNORMAL LOW (ref 137–147)

## 2014-09-17 LAB — URINALYSIS, ROUTINE W REFLEX MICROSCOPIC
Bilirubin Urine: NEGATIVE
Glucose, UA: NEGATIVE mg/dL
HGB URINE DIPSTICK: NEGATIVE
Ketones, ur: NEGATIVE mg/dL
Nitrite: NEGATIVE
Protein, ur: NEGATIVE mg/dL
SPECIFIC GRAVITY, URINE: 1.012 (ref 1.005–1.030)
Urobilinogen, UA: 0.2 mg/dL (ref 0.0–1.0)
pH: 6 (ref 5.0–8.0)

## 2014-09-17 LAB — URINE MICROSCOPIC-ADD ON

## 2014-09-17 LAB — CBC
HEMATOCRIT: 31.3 % — AB (ref 36.0–46.0)
HEMOGLOBIN: 9.4 g/dL — AB (ref 12.0–15.0)
MCH: 22.2 pg — ABNORMAL LOW (ref 26.0–34.0)
MCHC: 30 g/dL (ref 30.0–36.0)
MCV: 74 fL — ABNORMAL LOW (ref 78.0–100.0)
Platelets: 260 10*3/uL (ref 150–400)
RBC: 4.23 MIL/uL (ref 3.87–5.11)
RDW: 16.7 % — ABNORMAL HIGH (ref 11.5–15.5)
WBC: 4.8 10*3/uL (ref 4.0–10.5)

## 2014-09-17 LAB — TROPONIN I
TROPONIN I: 0.31 ng/mL — AB (ref <0.30)
Troponin I: <0.3 ng/mL (ref <0.30)

## 2014-09-17 NOTE — Telephone Encounter (Signed)
Noted, thanks!

## 2014-09-17 NOTE — Telephone Encounter (Signed)
Spoke to patient's son Ronalee Belts) and was advised that she was admitted at [facility] and they finally got her stabilized late afternoon. Ronalee Belts stated that her heart rate is all over the page and she is in afib and they did change one of her medications. Ronalee Belts stated that he does not know when she will be discharged.

## 2014-09-17 NOTE — Progress Notes (Signed)
CRITICAL VALUE ALERT  Critical value received:  Troponin 0.31  Date of notification:  09/17/14  Time of notification:  0755  Critical value read back:Yes.    Nurse who received alert:  Soledad Gerlach  MD notified (1st page):  Rae Halsted  Time of first page:  [phone]  MD notified (2nd page): Lisbeth Renshaw, NP  Time of second page: [phone]  Responding MD:  Joellen Jersey, [place]  Time MD responded:  [phone]

## 2014-09-17 NOTE — Telephone Encounter (Signed)
New message     [facility] at [facility] called the answering service to give [place] a critical troponin level.  Please call her at [phone]

## 2014-09-17 NOTE — Care Management Note (Addendum)
  Page 1 of 1   09/18/2014     8:19:14 PM CARE MANAGEMENT NOTE 09/18/2014  Patient:  Cindy [name],Cindy [name]   Account Number:  [id number]  Date Initiated:  09/17/2014  Documentation initiated by:  Geovanie Winnett  Subjective/Objective Assessment:   Afib with RVR, A/[name] CHF     Action/Plan:   CM to follow for dispostion needs   Anticipated DC Date:  09/20/2014   Anticipated DC Plan:  HOME/SELF CARE         Choice offered to / List presented to:             Status of service:  Completed, signed off Medicare Important Message given?  YES (If response is "NO", the following Medicare IM given date fields will be blank) Date Medicare IM given:  09/18/2014 Medicare IM given by:  Rashaad Hallstrom Date Additional Medicare IM given:   Additional Medicare IM given by:    Discharge Disposition:  HOME/SELF CARE  Per UR Regulation:  Reviewed for med. necessity/level of care/duration of stay  If discussed at [place] of Stay Meetings, dates discussed:    Comments:  Audy Dauphine RN, BSN, MSHL, CCM  Nurse - Case Manager,  ([facility] [place])  [phone]  09/17/2014

## 2014-09-17 NOTE — Progress Notes (Signed)
UR completed Cindy [name] [name], RN, BSN, MSHL, CCM  09/17/2014 2:26 PM

## 2014-09-17 NOTE — Telephone Encounter (Signed)
I called the floor to try to speak with [facility]. She was tied up. I left a message for her that we received the message about her troponin. Unsure as to why this call came to triage as this is an inpatient. [facility] paged. I spoke with Valetta Fuller at the hospital. She was already aware of the patient's most elevated troponin.

## 2014-09-17 NOTE — Progress Notes (Signed)
RN informed by CCMD that pt. Converted to NSR. Pt. Resting in bed. RN will continue to monitor. Collins Dimaria, Katherine Roan

## 2014-09-17 NOTE — Progress Notes (Signed)
Subjective:  Feeling better. Had a rough night. Has trouble sleeping when she gets pain medication she states. Just had BM.   Objective:  Vital Signs in the last 24 hours: Temp:  [97 F (36.1 C)-97.7 F (36.5 C)] 97.3 F (36.3 C) (10/13 0600) Pulse Rate:  [65-158] 67 (10/13 0600) Resp:  [16-25] 18 (10/13 0600) BP: (114-171)/(48-99) 139/69 mmHg (10/13 0600) SpO2:  [86 %-100 %] 99 % (10/13 0600) FiO2 (%):  [2 %] 2 % (10/12 1327) Weight:  [129 lb 10.1 oz (58.8 kg)-132 lb (59.875 kg)] 131 lb 3.2 oz (59.512 kg) (10/13 0600)  Intake/Output from previous day: 10/12 0701 - 10/13 0700 In: 700 [P.O.:700] Out: 1050 [Urine:1050]   Physical Exam: General: Elderly, in no acute distress. Head:  Normocephalic and atraumatic. Lungs: Clear to auscultation and percussion. Heart: Normal S1 and S2.  2/6 S murmur apex, no rubs or gallops.  Abdomen: soft, non-tender, positive bowel sounds. Extremities: No clubbing or cyanosis. No edema. Neurologic: Alert and oriented x 3.    Lab Results:  Recent Labs  09/16/14 1339 09/17/14 0709  WBC 6.3 4.8  HGB 10.5* 9.4*  PLT 269 260    Recent Labs  09/16/14 1339 09/17/14 0709  NA 138 132*  K 4.0 3.7  CL 100 95*  CO2 21 26  GLUCOSE 101* 104*  BUN 13 13  CREATININE 0.81 0.82    Recent Labs  09/17/14 0129 09/17/14 0709  TROPONINI <0.30 0.31*   Hepatic Function Panel  Recent Labs  09/16/14 1339  PROT 6.7  ALBUMIN 3.3*  AST 17  ALT 10  ALKPHOS 79  BILITOT 0.3   Imaging: Dg Chest 2 View  09/16/2014   CLINICAL DATA:  Chest pain and shortness of breath.  EXAM: CHEST  2 VIEW  COMPARISON:  08/19/2014.  FINDINGS: Cardiomegaly. Calcified tortuous aorta. Mild vascular congestion, no overt failure or focal infiltrates. Hiatal hernia. Severe skeletal osteopenia. Exaggerated kyphosis and moderate thoracic spondylosis without definite acute compression deformity.  IMPRESSION: No active cardiopulmonary disease. Cardiomegaly. Improved  aeration compared with previous radiograph demonstrating CHF.   Electronically Signed   By: Rolla Flatten M.D.   On: 09/16/2014 14:54   Personally viewed.   Telemetry: Paroxysmal atrial fibrillation with heart rates of 120-130, currently sinus rhythm. Personally viewed.   EKG:  Admit EKG -Atrial fibrillation with rapid ventricular response. Followup EKG on 09/17/14 shows sinus rhythm with T wave inversion in the lateral leads (seen previously during episode of rapid ventricular response) .  Cardiac Studies:  Ejection fraction normal, moderate to severe mitral regurgitation.  Assessment/Plan:  Principal Problem:   Atrial fibrillation with RVR Active Problems:   CAD (coronary artery disease)   Mitral valve regurgitation, mod to severe with moderately calcified annulus   Acute on chronic diastolic congestive heart failure   Demand ischemia  80 year old admitted with paroxysmal atrial fibrillation, demand ischemia, underlying coronary artery disease-treated medically with shortness of breath.  1.Paroxysmal atrial fibrillation   - Original heart rate 120-130 range in the emergency department.  - Currently sinus rhythm/sinus bradycardia. She converted. She has not been able to tolerate amiodarone or Tikosyn in the past. Prolonged QT with Tikosyn. Not a candidate for anticoagulation because of history of bleeding and falls. On aspirin 81.  - Bystolic 2.5mg  started.  Did not tolerate metoprolol in the past.  - Diltiazem CD 180 mg twice a day  - Challenging situation.  - In reading history and physical, she states that her  quality of life is poor and that she doesn't want aggressive measures and would be happy if she just "died ".  2. Acute diastolic heart failure  - Improved with IV Lasix 20 mg.  - Back on home dose of Lasix.  - Improved with sinus rhythm.  3. Mitral regurgitation-moderate to severe-nonsurgical candidate.  4. Hypothyroidism-Synthroid.  5. Demand ischemia-troponin mildly  elevated at 0.36. Cystic and very to her rapid ventricular response atrial fibrillation with supply demand mismatch. No IV heparin. Not ACS.   Disposition-ultimate goals of care are for medical management of her atrial fibrillation as best as possible.  I would like to keep her today to monitor response to new medication. Likely home tomorrow.   Nivedita Mirabella, [place] 09/17/2014, 9:27 AM

## 2014-09-18 DIAGNOSIS — G479 Sleep disorder, unspecified: Secondary | ICD-10-CM

## 2014-09-18 MED ORDER — ZOLPIDEM TARTRATE 5 MG PO TABS: 5.0000 mg | ORAL_TABLET | Freq: Every evening | ORAL | Status: DC | PRN

## 2014-09-18 MED ORDER — DILTIAZEM HCL 30 MG PO TABS: 30.0000 mg | ORAL_TABLET | Freq: Four times a day (QID) | ORAL | Status: DC | PRN

## 2014-09-18 MED ORDER — DILTIAZEM HCL 30 MG PO TABS: 30.0000 mg | ORAL_TABLET | Freq: Four times a day (QID) | ORAL | Status: AC | PRN

## 2014-09-18 MED ORDER — DILTIAZEM HCL 30 MG PO TABS
30.0000 mg | ORAL_TABLET | Freq: Four times a day (QID) | ORAL | Status: DC | PRN
  Filled 2014-09-18: qty 1

## 2014-09-18 NOTE — Progress Notes (Signed)
Subjective:  Feeling worse today. Irritated. Wants to go home. Sons in room. Head spins at times.  Had a rough night. Has trouble sleeping when she gets pain medication she states. HR in upper 40's during sleep.    Objective:  Vital Signs in the last 24 hours: Temp:  [97.3 F (36.3 C)-97.6 F (36.4 C)] 97.3 F (36.3 C) (10/14 0555) Pulse Rate:  [56-68] 58 (10/14 0555) Resp:  [18] 18 (10/14 0555) BP: (119-137)/(44-61) 137/61 mmHg (10/14 0555) SpO2:  [95 %-100 %] 98 % (10/14 0555) Weight:  [133 lb 3.2 oz (60.419 kg)] 133 lb 3.2 oz (60.419 kg) (10/14 0555)  Intake/Output from previous day: 10/13 0701 - 10/14 0700 In: 1560 [P.O.:1560] Out: 1701 [Urine:1700; Stool:1]   Physical Exam: General: Elderly, in no acute distress. Head:  Normocephalic and atraumatic. Lungs: Clear to auscultation and percussion. Heart: Brady reg S1 and S2.  2/6 S murmur apex, no rubs or gallops.  Abdomen: soft, non-tender, positive bowel sounds. Extremities: No clubbing or cyanosis. No edema. Neurologic: Alert and oriented x 3.    Lab Results:  Recent Labs  09/16/14 1339 09/17/14 0709  WBC 6.3 4.8  HGB 10.5* 9.4*  PLT 269 260    Recent Labs  09/16/14 1339 09/17/14 0709  NA 138 132*  K 4.0 3.7  CL 100 95*  CO2 21 26  GLUCOSE 101* 104*  BUN 13 13  CREATININE 0.81 0.82    Recent Labs  09/17/14 0129 09/17/14 0709  TROPONINI <0.30 0.31*   Hepatic Function Panel  Recent Labs  09/16/14 1339  PROT 6.7  ALBUMIN 3.3*  AST 17  ALT 10  ALKPHOS 79  BILITOT 0.3   Imaging: Dg Chest 2 View  09/16/2014   CLINICAL DATA:  Chest pain and shortness of breath.  EXAM: CHEST  2 VIEW  COMPARISON:  08/19/2014.  FINDINGS: Cardiomegaly. Calcified tortuous aorta. Mild vascular congestion, no overt failure or focal infiltrates. Hiatal hernia. Severe skeletal osteopenia. Exaggerated kyphosis and moderate thoracic spondylosis without definite acute compression deformity.  IMPRESSION: No active  cardiopulmonary disease. Cardiomegaly. Improved aeration compared with previous radiograph demonstrating CHF.   Electronically Signed   By: Rolla Flatten M.D.   On: 09/16/2014 14:54   Personally viewed.   Telemetry: Paroxysmal atrial fibrillation with heart rates of 120-130, currently sinus rhythm. HR during sleep upper 40'sPersonally viewed.   EKG:  Admit EKG -Atrial fibrillation with rapid ventricular response. Followup EKG on 09/17/14 shows sinus rhythm with T wave inversion in the lateral leads (seen previously during episode of rapid ventricular response) .  Cardiac Studies:  Ejection fraction normal, moderate to severe mitral regurgitation.  Assessment/Plan:  Principal Problem:   Atrial fibrillation with RVR Active Problems:   CAD (coronary artery disease)   Mitral valve regurgitation, mod to severe with moderately calcified annulus   Acute on chronic diastolic congestive heart failure   Demand ischemia  80 year old admitted with paroxysmal atrial fibrillation, demand ischemia, underlying coronary artery disease-treated medically with shortness of breath.  1.Paroxysmal atrial fibrillation   - Original heart rate 120-130 range in the emergency department.  - Currently sinus rhythm/sinus bradycardia. She converted. She has not been able to tolerate amiodarone or Tikosyn in the past. Prolonged QT with Tikosyn. Not a candidate for anticoagulation because of history of bleeding and falls. On aspirin 81.  - Bystolic 2.5mg  started.  Did not tolerate. Nor did she tolerate metoprolol in the past.   - Diltiazem CD 180 mg twice  a day  - Challenging situation.  - I will give her diltiazem 30mg  PO Q6 hr PRN if she feels heart racing.   - She states that her quality of life is poor and that she doesn't want aggressive measures and would be happy if she just "died ".  2. Acute diastolic heart failure  - Improved with IV Lasix 20 mg.  - Back on home dose of Lasix.  - Improved with sinus  rhythm.  3. Mitral regurgitation-moderate to severe-nonsurgical candidate.  4. Hypothyroidism-Synthroid.  5. Demand ischemia-troponin mildly elevated at 0.36. Cystic and very to her rapid ventricular response atrial fibrillation with supply demand mismatch. No IV heparin. Not ACS.   Disposition-ultimate goals of care are for medical management of her atrial fibrillation as best as possible.  Spoke with sons. They have tried to get her the help she needs at home but she often refuses.   OK for DC home. Also instructed her to take extra diltiazem PRN to help if heart races.    SKAINS, Fleming 09/18/2014, 12:32 PM

## 2014-09-18 NOTE — Progress Notes (Signed)
Patient evaluated for community based chronic disease management services with [facility] as a benefit of patient's [employer]. Spoke with patient and son at bedside to explain [place] [facility] services.  Services have been accepted with written consent.  Son/HCPOA, Nekisha Mcdiarmid [phone], is her primary and authorized contact.  Son indicated that she may progress to ALF as needed later but is currently resistant to the change.  She has a paid caregiver in the home ([place] [phone]) that can increase her services hours.  [facility] will assign a RN and SW to assess her need for community resources.  Focus will be on personal care and CHF disease management.  Patient is at home alone most of the day.  Has marked HOH.  Patient will receive a post discharge transition of care call and will be evaluated for monthly home visits for assessments and disease process education.  Left contact information and [facility] literature at bedside. Made Inpatient Case Manager aware that [facility] following. Of note, [facility] services does not replace or interfere with any services that are arranged by inpatient case management or social work.  For additional questions or referrals please contact Corliss Blacker BSN RN [facility] Liaison at [phone].

## 2014-09-18 NOTE — Progress Notes (Signed)
Pt a&ox4. Pt calm and cooperative. Son at bedside. AVS explained and reviewed with Pt and Pt's son. PA called to verify medication/prescription. No concerns. Questions answered. IV removed. Pt discharged home with son.

## 2014-09-18 NOTE — Discharge Summary (Signed)
Discharge Summary   Patient ID: Cindy [name],  MRN: [id number], DOB/AGE: February 08, 1922 80 y.o.  Admit date: 09/16/2014 Discharge date: 09/18/2014  Primary Care Provider: Elsie Stain Primary Cardiologist: Dr. Martinique   Discharge Diagnoses Principal Problem:   Atrial fibrillation with RVR Active Problems:   CAD (coronary artery disease)   Mitral valve regurgitation, mod to severe with moderately calcified annulus   Acute on chronic diastolic congestive heart failure   Demand ischemia   Allergies Allergies  Allergen Reactions  . Lipitor [Atorvastatin] Nausea Only and Other (See Comments)    LFT elevation  . Morphine And Related Other (See Comments)    "drives me crazy" and hyperactivity  . Irbesartan Swelling  . Ramipril Swelling    REACTION: lips swelling  . Telmisartan-Hctz Other (See Comments)    REACTION: incontinence  . Amiodarone Other (See Comments)     unknown  . Hydrochlorothiazide Other (See Comments)    hyponatremia  . Metoprolol Other (See Comments)    Headache, dizzy, "terribly sick"  . Other Other (See Comments)    ANTICOAGULANTS - not a candidate due to history of falls and bleeding  . Tikosyn [Dofetilide] Other (See Comments)    Not a candidate due to Prolonged QT  . Norpace [Disopyramide] Other (See Comments)    Dry mouth     Hospital Course  The patient is a 80 year old female with past medical history of HOCM, coronary artery disease s/p NSTEMI April 2014 in the setting of rapid A. Fib on medical management, PAF and chronic diastolic heart failure who presented to [facility] on [facility]2014 with increasing shortness of breath over the weekend. Of note, she was intolerant to amiodarone and not a candidate for Tikosyn due to prolonged QT in the past. She was admitted back in March 2015 with acute onset of shortness of breath and hypoxia as well. On arrival, she was in atrial fibrillation with heart rate 128. She was given IV Lasix 20 mg in the  ED and was started on diltiazem drip with good rate control.  She was admitted to cardiology service, overnight, patient converted to sinus rhythm while on diltiazem drip. Bystolic 2.5 mg was started briefly, however she did not tolerate it. We have to decided to approach her rate control issue with her normal long-acting diltiazem 180 mg twice a day and PRN diltiazem on the side to control her paroxysmal atrial fibrillation. Patient was seen in the morning of 09/18/2013, her heart rate does occasionally brady down to the 40s during sleep, however she is rate controlled and asymptomatic. She is deemed stable for discharge from cardiology perspective.   Of note, she did have some demand ischemia with mild trop elevation up to 0.36, however it was not ACS. She will resume on home dose of lasix which is 10mg  daily, however 20mg  if has any weight gain. Of note, she has been made DO NOT RESUSCITATE status   Discharge Vitals Blood pressure 123/60, pulse 70, temperature 97.9 F (36.6 C), temperature source Oral, resp. rate 20, height 4\' 8"  (1.422 m), weight 133 lb 3.2 oz (60.419 kg), SpO2 96.00%.  Filed Weights   09/16/14 1859 09/17/14 0600 09/18/14 0555  Weight: 129 lb 10.1 oz (58.8 kg) 131 lb 3.2 oz (59.512 kg) 133 lb 3.2 oz (60.419 kg)    Labs  CBC  Recent Labs  09/16/14 1339 09/17/14 0709  WBC 6.3 4.8  HGB 10.5* 9.4*  HCT 35.5* 31.3*  MCV 74.9* 74.0*  PLT 269 260  Basic Metabolic Panel  Recent Labs  09/16/14 1339 09/16/14 1957 09/17/14 0709  NA 138  --  132*  K 4.0  --  3.7  CL 100  --  95*  CO2 21  --  26  GLUCOSE 101*  --  104*  BUN 13  --  13  CREATININE 0.81  --  0.82  CALCIUM 8.8  --  8.5  MG  --  2.1  --    Liver Function Tests  Recent Labs  09/16/14 1339  AST 17  ALT 10  ALKPHOS 79  BILITOT 0.3  PROT 6.7  ALBUMIN 3.3*   Cardiac Enzymes  Recent Labs  09/16/14 1957 09/17/14 0129 09/17/14 0709  TROPONINI 0.35* <0.30 0.31*   Thyroid Function  Tests  Recent Labs  09/16/14 1957  TSH 1.010    Disposition  Pt is being discharged home today in good condition.  Follow-up Plans & Appointments      Follow-up Information   Follow up with Peter Martinique, MD On 10/25/2014. (1:30pm)    Specialty:  Cardiology   Contact information:   [address] [place] [address] [phone]       Follow up with Erlene Quan, PA-C On 10/07/2014. (11:00am)    Specialty:  Cardiology   Contact information:   [address] [place] [address] [phone]       Discharge Medications    Medication List         aspirin 81 MG chewable tablet  Chew 1 tablet (81 mg total) by mouth daily.     BESIVANCE 0.6 % Susp  Generic drug:  Besifloxacin HCl  Place 1 drop into both eyes 2 (two) times daily.     diltiazem 180 MG 24 hr capsule  Commonly known as:  CARDIZEM CD  Take 1 capsule (180 mg total) by mouth 2 (two) times daily.     diltiazem 30 MG tablet  Commonly known as:  CARDIZEM  Take 1 tablet (30 mg total) by mouth every 6 (six) hours as needed (rapid heart rate.).     docusate sodium 100 MG capsule  Commonly known as:  COLACE  Take 100 mg by mouth daily as needed for mild constipation.     fish oil-omega-3 fatty acids 1000 MG capsule  Take 1,000 mg by mouth daily.     furosemide 20 MG tablet  Commonly known as:  LASIX  Take 20 mg by mouth daily. Take an extra tab daily as needed for fluid (if weight gain >3lbs).     HYDROcodone-acetaminophen 5-325 MG per tablet  Commonly known as:  NORCO/VICODIN  Take 0.5-1 tablets by mouth 2 (two) times daily as needed for moderate pain.     levothyroxine 25 MCG tablet  Commonly known as:  SYNTHROID, LEVOTHROID  Take 25 mcg by mouth every morning.     losartan 50 MG tablet  Commonly known as:  COZAAR  Take 50 mg by mouth daily.     OCUVITE PRESERVISION Tabs  Take 1 tablet by mouth 2 (two) times daily.     omeprazole 40 MG capsule  Commonly known  as:  PRILOSEC  Take 40 mg by mouth daily.     zolpidem 5 MG tablet  Commonly known as:  AMBIEN  Take 1 tablet (5 mg total) by mouth at bedtime as needed for sleep.        Duration of Discharge Encounter   Greater than 30 minutes including physician time.  Hilbert Corrigan PA-C Pager: [phone] 09/18/2014, 1:53 PM

## 2014-09-18 NOTE — Telephone Encounter (Addendum)
Cindy [name] pts son came to office [facility]. Cindy [name] does not need cb; he will ck with pharmacy for refill.Please advise.

## 2014-09-18 NOTE — Discharge Instructions (Signed)
Atrial Fibrillation  Atrial fibrillation is a condition that causes your heart to beat irregularly. It may also cause your heart to beat faster than normal. Atrial fibrillation can prevent your heart from pumping blood normally. It increases your risk of stroke and heart problems.  HOME CARE  · Take medications as told by your doctor.  · Only take medications that your doctor says are safe. Some medications can make the condition worse or happen again.  · If blood thinners were prescribed by your doctor, take them exactly as told. Too much can cause bleeding. Too little and you will not have the needed protection against stroke and other problems.  · Perform blood tests at home if told by your doctor.  · Perform blood tests exactly as told by your doctor.  · Do not drink alcohol.  · Do not drink beverages with caffeine such as coffee, soda, and some teas.  · Maintain a healthy weight.  · Do not use diet pills unless your doctor says they are safe. They may make heart problems worse.  · Follow diet instructions as told by your doctor.  · Exercise regularly as told by your doctor.  · Keep all follow-up appointments.  GET HELP IF:  · You notice a change in the speed, rhythm, or strength of your heartbeat.  · You suddenly begin peeing (urinating) more often.  · You get tired more easily when moving or exercising.  GET HELP RIGHT AWAY IF:   · You have chest or belly (abdominal) pain.  · You feel sick to your stomach (nauseous).  · You are short of breath.  · You suddenly have swollen feet and ankles.  · You feel dizzy.  · You face, arms, or legs feel numb or weak.  · There is a change in your vision or speech.  MAKE SURE YOU:   · Understand these instructions.  · Will watch your condition.  · Will get help right away if you are not doing well or get worse.  Document Released: 08/31/2008 Document Revised: 04/08/2014 Document Reviewed: 01/02/2013  ExitCare® Patient Information ©2015 [employer]. This information is not  intended to replace advice given to you by your health care provider. Make sure you discuss any questions you have with your health care provider.

## 2014-09-18 NOTE — Evaluation (Signed)
Physical Therapy Evaluation Patient Details Name: Cindy [name] MRN: [id number] DOB: 10-Oct-1922 Today's Date: 09/18/2014   History of Present Illness  80 year old admitted with paroxysmal atrial fibrillation, demand ischemia, underlying coronary artery disease-treated medically with shortness of breath.  Clinical Impression   Pt admitted with above. Pt currently with functional limitations due to the deficits listed below (see PT Problem List).  Pt will benefit from skilled PT to increase their independence and safety with mobility to allow discharge to the venue listed below.   Discussed with pt and son the need to start thinking ahead for longer-term options; Pt open to the conversation, and willing to get info about community resources.      Follow Up Recommendations Home health PT;Supervision - Intermittent    Equipment Recommendations  None recommended by PT    Recommendations for Other Services       Precautions / Restrictions Precautions Precautions: Fall      Mobility  Bed Mobility                  Transfers Overall transfer level: Needs assistance Equipment used: Rolling walker (2 wheeled) Transfers: Sit to/from Stand Sit to Stand: Supervision         General transfer comment: cues for safety and hand placement  Ambulation/Gait Ambulation/Gait assistance: Supervision Ambulation Distance (Feet): 200 Feet Assistive device: Rolling walker (2 wheeled) Gait Pattern/deviations: Step-through pattern;Trunk flexed     General Gait Details: Cues for RW proximity  Stairs            Wheelchair Mobility    Modified Rankin (Stroke Patients Only)       Balance                                             Pertinent Vitals/Pain Pain Assessment: No/denies pain    Home Living Family/patient expects to be discharged to:: Private residence Living Arrangements: Alone Available Help at Discharge: Family;Available  PRN/intermittently Type of Home: House Home Access: Stairs to enter;Ramped entrance Entrance Stairs-Rails: Right Entrance Stairs-Number of Steps: 2 Home Layout: One level Home Equipment: Walker - 4 wheels;Cane - single point;Shower seat;Wheelchair - manual      Prior Function Level of Independence: Independent         Comments: Pt reports an aide coming in a few hours each morning to assist with ADLs and some IADLs as needed. Pt ambulates (I) for household, uses w/c for long distances and occasionally uses rollator when feeling stiff. No falls in the last year. Family checks on her a few days/week.     Hand Dominance   Dominant Hand: Right    Extremity/Trunk Assessment   Upper Extremity Assessment: Overall WFL for tasks assessed           Lower Extremity Assessment: Generalized weakness      Cervical / Trunk Assessment: Kyphotic  Communication   Communication: HOH  Cognition Arousal/Alertness: Awake/alert Behavior During Therapy: WFL for tasks assessed/performed Overall Cognitive Status: Within Functional Limits for tasks assessed                      General Comments      Exercises        Assessment/Plan    PT Assessment Patient needs continued PT services  PT Diagnosis Difficulty walking   PT Problem List Decreased strength;Decreased range of  motion;Decreased activity tolerance;Decreased balance;Decreased knowledge of use of DME;Decreased knowledge of precautions  PT Treatment Interventions DME instruction;Gait training;Stair training;Functional mobility training;Therapeutic activities;Balance training;Cognitive remediation   PT Goals (Current goals can be found in the Care Plan section) Acute Rehab PT Goals Patient Stated Goal: wants to be home independently PT Goal Formulation: With patient Time For Goal Achievement: 10/02/14 Potential to Achieve Goals: Good    Frequency Min 3X/week   Barriers to discharge Decreased caregiver support       Co-evaluation               End of Session   Activity Tolerance: Patient tolerated treatment well Patient left: in chair;with call bell/phone within reach;with family/visitor present Nurse Communication: Mobility status         Time: 2263-3354 PT Time Calculation (min): 23 min   Charges:   PT Evaluation $Initial PT Evaluation Tier I: 1 Procedure PT Treatments $Gait Training: 8-22 mins   PT G Codes:          Quin Hoop 09/18/2014, 4:29 PM  Roney Marion, [place] Pager [phone] Office [phone]

## 2014-09-18 NOTE — Telephone Encounter (Signed)
Printed.  Thanks.  Please send in.   

## 2014-09-18 NOTE — Discharge Summary (Signed)
Personally seen and examined. Agree with above. Marka Treloar, MD  

## 2014-09-18 NOTE — Addendum Note (Signed)
Addended by: Tonia Ghent on: 09/18/2014 09:49 PM   Modules accepted: Orders

## 2014-09-18 NOTE — Addendum Note (Signed)
Addended by: Helene Shoe on: 09/18/2014 04:23 PM   Modules accepted: Orders

## 2014-09-19 NOTE — Telephone Encounter (Signed)
Rx faxed to pharmacy as instructed. 

## 2014-09-23 ENCOUNTER — Encounter (INDEPENDENT_AMBULATORY_CARE_PROVIDER_SITE_OTHER): Payer: Medicare Other | Admitting: Ophthalmology

## 2014-09-23 DIAGNOSIS — H3531 Nonexudative age-related macular degeneration: Secondary | ICD-10-CM

## 2014-09-23 DIAGNOSIS — H43813 Vitreous degeneration, bilateral: Secondary | ICD-10-CM

## 2014-09-23 DIAGNOSIS — I10 Essential (primary) hypertension: Secondary | ICD-10-CM

## 2014-09-23 DIAGNOSIS — H35033 Hypertensive retinopathy, bilateral: Secondary | ICD-10-CM

## 2014-10-07 ENCOUNTER — Encounter: Payer: Self-pay | Admitting: Cardiology

## 2014-10-07 ENCOUNTER — Ambulatory Visit (INDEPENDENT_AMBULATORY_CARE_PROVIDER_SITE_OTHER): Payer: Medicare Other | Admitting: Cardiology

## 2014-10-07 VITALS — BP 145/59 | HR 75 | Ht <= 58 in | Wt 133.7 lb

## 2014-10-07 DIAGNOSIS — I5032 Chronic diastolic (congestive) heart failure: Secondary | ICD-10-CM

## 2014-10-07 DIAGNOSIS — R002 Palpitations: Secondary | ICD-10-CM

## 2014-10-07 DIAGNOSIS — I340 Nonrheumatic mitral (valve) insufficiency: Secondary | ICD-10-CM

## 2014-10-07 DIAGNOSIS — I2510 Atherosclerotic heart disease of native coronary artery without angina pectoris: Secondary | ICD-10-CM

## 2014-10-07 MED ORDER — DILTIAZEM HCL ER COATED BEADS 180 MG PO CP24: 180.0000 mg | ORAL_CAPSULE | Freq: Every day | ORAL | Status: DC

## 2014-10-07 NOTE — Assessment & Plan Note (Signed)
Grade 2  By echo March 2015

## 2014-10-07 NOTE — Assessment & Plan Note (Signed)
NSTEMI 03/2013: secondary to diagonal disease (small, not amenable to PCI, for med rx)

## 2014-10-07 NOTE — Assessment & Plan Note (Signed)
Plan is for rate control. Not an anticoagulation candidate secondary to history of falls.

## 2014-10-07 NOTE — Assessment & Plan Note (Signed)
Seen on echo march 2015

## 2014-10-07 NOTE — Progress Notes (Signed)
10/07/2014 Cindy [name]   [birth year]  [id number]  Primary Physicia Elsie Stain, MD Primary Cardiologist: Dr Martinique  HPI:  80y.o. female with a past medical history significant for HCVD, CAD with past NSTEMI in 03/2013, and rapid PAF. She had a high grade stenosis in the first diagonal branch (moderate vessel in length but small in caliber) and a 70% stenosis in the 2nd DX in April 2014 - she was managed medically. She has PAF and presented in March 2015 with rapid AF and diastolic CHF. Her EF was normal by echo March 2015  but with grade II diastolic dysfunction, mild AS, moderate to severe MR and mild MS. She is intolerant to amiodarone and not a candidate for Tikosyn due to prolonged QT - not a candidate for anticoagulation given past history of bleeding and falls. She was recently admitted again with rapid AF 09/16/14. She converted spontaneously after Diltiazem was added. She did not tolerate beta blocker secondary to bradycardia. She is here for f/u OV today. Since discharge she has had palpitations and says her HR goes up when she walks. We walked her around the office today and her HR went to 86. EKG shows NSR.    Current Outpatient Prescriptions  Medication Sig Dispense Refill  . aspirin 81 MG chewable tablet Chew 1 tablet (81 mg total) by mouth daily.    Marland Kitchen BESIVANCE 0.6 % SUSP Place 1 drop into both eyes 2 (two) times daily.     Marland Kitchen diltiazem (CARDIZEM CD) 180 MG 24 hr capsule Take 1 capsule (180 mg total) by mouth 2 (two) times daily. 60 capsule 5  . diltiazem (CARDIZEM) 30 MG tablet Take 1 tablet (30 mg total) by mouth every 6 (six) hours as needed (rapid heart rate.). 60 tablet 0  . docusate sodium (COLACE) 100 MG capsule Take 100 mg by mouth daily as needed for mild constipation.    . fish oil-omega-3 fatty acids 1000 MG capsule Take 1,000 mg by mouth daily.     . furosemide (LASIX) 20 MG tablet Take 20 mg by mouth daily. Take an extra tab daily as needed for fluid (if weight  gain >3lbs).    Marland Kitchen HYDROcodone-acetaminophen (NORCO/VICODIN) 5-325 MG per tablet Take 0.5-1 tablets by mouth 2 (two) times daily as needed for moderate pain.    Marland Kitchen levothyroxine (SYNTHROID, LEVOTHROID) 25 MCG tablet Take 25 mcg by mouth every morning.    Marland Kitchen losartan (COZAAR) 50 MG tablet Take 50 mg by mouth daily.     . Multiple Vitamins-Minerals (OCUVITE PRESERVISION) TABS Take 1 tablet by mouth 2 (two) times daily.     Marland Kitchen omeprazole (PRILOSEC) 40 MG capsule Take 40 mg by mouth daily.    Marland Kitchen zolpidem (AMBIEN) 5 MG tablet Take 1 tablet (5 mg total) by mouth at bedtime as needed for sleep. 90 tablet 1   No current facility-administered medications for this visit.    Allergies  Allergen Reactions  . Lipitor [Atorvastatin] Nausea Only and Other (See Comments)    LFT elevation  . Morphine And Related Other (See Comments)    "drives me crazy" and hyperactivity  . Irbesartan Swelling  . Ramipril Swelling    REACTION: lips swelling  . Telmisartan-Hctz Other (See Comments)    REACTION: incontinence  . Amiodarone Other (See Comments)     unknown  . Hydrochlorothiazide Other (See Comments)    hyponatremia  . Metoprolol Other (See Comments)    Headache, dizzy, "terribly sick"  . Other Other (  See Comments)    ANTICOAGULANTS - not a candidate due to history of falls and bleeding  . Tikosyn [Dofetilide] Other (See Comments)    Not a candidate due to Prolonged QT  . Norpace [Disopyramide] Other (See Comments)    Dry mouth    History   Social History  . Marital Status: Married    Spouse Name: N/A    Number of Children: 26  . Years of Education: N/A   Occupational History  . retired    Social History Main Topics  . Smoking status: Former Smoker -- 0.50 packs/day for 4 years    Types: Cigarettes    Quit date: 07/06/1974  . Smokeless tobacco: Never Used  . Alcohol Use: No  . Drug Use: No  . Sexual Activity: No   Other Topics Concern  . Not on file   Social History Narrative    Married, with 5 children out of the home   Occupation: is on the [employer]: retired since 1974 from [employer]: General: negative for chills, fever, night sweats or weight changes.  Cardiovascular: negative for chest pain, dyspnea on exertion, edema, orthopnea, palpitations, paroxysmal nocturnal dyspnea or shortness of breath Dermatological: negative for rash Respiratory: negative for cough or wheezing Urologic: negative for hematuria Abdominal: negative for nausea, vomiting, diarrhea, bright red blood per rectum, melena, or hematemesis Neurologic: negative for visual changes, syncope, or dizziness All other systems reviewed and are otherwise negative except as noted above.    Blood pressure 145/59, pulse 75, height 4\' 8"  (1.422 m), weight 133 lb 11.2 oz (60.646 kg).  General appearance: alert, cooperative, no distress and elderly, frail Lungs: kyphosis, basilar crackles Heart: regular rate and rhythm and 2/6 systolic murmur  EKG NSR, 1st degree AVB  ASSESSMENT AND PLAN:   Palpitations Plan is for rate control. Not an anticoagulation candidate secondary to history of falls.  Chronic diastolic CHF (congestive heart failure) Grade 2  By echo March 2015  CAD (coronary artery disease)  NSTEMI 03/2013: secondary to diagonal disease (small, not amenable to PCI, for med rx)  Mitral valve regurgitation, mod to severe with moderately calcified annulus Seen on echo march 2015   PLAN  She is a little confused about her Diltiazem. She told us she is actually only taking 180 mg once a day. She was not sure when to take the 30 mg PRN dose.  A friend accompanied her to the office today. I have asked the RN to write down her Diltiazem dosing instructions. She has an apt with Dr Martinique in Dec and will keep that.   Ericha Whittingham KPA-C 10/07/2014 11:20 AM

## 2014-10-07 NOTE — Patient Instructions (Addendum)
Take the Diltiazem 180mg  once a day.  You can take the Diltiazem 30mg  up to twice a day if your heart rate is 120 or greater lasting 20 minutes or more.  You have two follow-up appointments with Dr. Martinique.  We will cancel the 11/20 appointment and keep the 11/15/14 appointment.

## 2014-10-10 ENCOUNTER — Other Ambulatory Visit: Payer: Self-pay | Admitting: *Deleted

## 2014-10-10 MED ORDER — LEVOTHYROXINE SODIUM 25 MCG PO TABS: 25.0000 ug | ORAL_TABLET | Freq: Every morning | ORAL | Status: AC

## 2014-10-14 NOTE — Addendum Note (Signed)
Addended by: Janett Labella A on: 10/14/2014 02:42 PM   Modules accepted: Orders

## 2014-10-25 ENCOUNTER — Ambulatory Visit: Payer: Medicare Other | Admitting: Cardiology

## 2014-11-11 ENCOUNTER — Encounter (HOSPITAL_COMMUNITY): Payer: Self-pay | Admitting: *Deleted

## 2014-11-11 ENCOUNTER — Inpatient Hospital Stay (HOSPITAL_COMMUNITY)
Admission: EM | Admit: 2014-11-11 | Discharge: 2014-11-18 | DRG: 291 | Disposition: A | Discharge status: Home / Self Care | Payer: Medicare Other | Attending: Family Medicine | Admitting: Family Medicine

## 2014-11-11 ENCOUNTER — Emergency Department (HOSPITAL_COMMUNITY): Payer: Medicare Other

## 2014-11-11 DIAGNOSIS — E876 Hypokalemia: Secondary | ICD-10-CM | POA: Diagnosis present

## 2014-11-11 DIAGNOSIS — E785 Hyperlipidemia, unspecified: Secondary | ICD-10-CM | POA: Diagnosis present

## 2014-11-11 DIAGNOSIS — I5031 Acute diastolic (congestive) heart failure: Secondary | ICD-10-CM | POA: Diagnosis present

## 2014-11-11 DIAGNOSIS — M1990 Unspecified osteoarthritis, unspecified site: Secondary | ICD-10-CM | POA: Diagnosis present

## 2014-11-11 DIAGNOSIS — D509 Iron deficiency anemia, unspecified: Secondary | ICD-10-CM | POA: Diagnosis present

## 2014-11-11 DIAGNOSIS — J189 Pneumonia, unspecified organism: Secondary | ICD-10-CM | POA: Diagnosis present

## 2014-11-11 DIAGNOSIS — Z7982 Long term (current) use of aspirin: Secondary | ICD-10-CM | POA: Diagnosis not present

## 2014-11-11 DIAGNOSIS — Z6826 Body mass index (BMI) 26.0-26.9, adult: Secondary | ICD-10-CM

## 2014-11-11 DIAGNOSIS — Z66 Do not resuscitate: Secondary | ICD-10-CM | POA: Diagnosis present

## 2014-11-11 DIAGNOSIS — J9600 Acute respiratory failure, unspecified whether with hypoxia or hypercapnia: Secondary | ICD-10-CM | POA: Diagnosis present

## 2014-11-11 DIAGNOSIS — I340 Nonrheumatic mitral (valve) insufficiency: Secondary | ICD-10-CM | POA: Diagnosis present

## 2014-11-11 DIAGNOSIS — Z515 Encounter for palliative care: Secondary | ICD-10-CM | POA: Diagnosis not present

## 2014-11-11 DIAGNOSIS — Z885 Allergy status to narcotic agent status: Secondary | ICD-10-CM

## 2014-11-11 DIAGNOSIS — I2510 Atherosclerotic heart disease of native coronary artery without angina pectoris: Secondary | ICD-10-CM | POA: Diagnosis present

## 2014-11-11 DIAGNOSIS — I421 Obstructive hypertrophic cardiomyopathy: Secondary | ICD-10-CM | POA: Diagnosis present

## 2014-11-11 DIAGNOSIS — I422 Other hypertrophic cardiomyopathy: Secondary | ICD-10-CM | POA: Diagnosis present

## 2014-11-11 DIAGNOSIS — J80 Acute respiratory distress syndrome: Secondary | ICD-10-CM | POA: Diagnosis present

## 2014-11-11 DIAGNOSIS — Z888 Allergy status to other drugs, medicaments and biological substances status: Secondary | ICD-10-CM | POA: Diagnosis not present

## 2014-11-11 DIAGNOSIS — Z881 Allergy status to other antibiotic agents status: Secondary | ICD-10-CM | POA: Diagnosis not present

## 2014-11-11 DIAGNOSIS — I959 Hypotension, unspecified: Secondary | ICD-10-CM | POA: Diagnosis present

## 2014-11-11 DIAGNOSIS — E43 Unspecified severe protein-calorie malnutrition: Secondary | ICD-10-CM | POA: Diagnosis present

## 2014-11-11 DIAGNOSIS — K219 Gastro-esophageal reflux disease without esophagitis: Secondary | ICD-10-CM | POA: Diagnosis present

## 2014-11-11 DIAGNOSIS — F329 Major depressive disorder, single episode, unspecified: Secondary | ICD-10-CM | POA: Diagnosis present

## 2014-11-11 DIAGNOSIS — K5730 Diverticulosis of large intestine without perforation or abscess without bleeding: Secondary | ICD-10-CM | POA: Diagnosis present

## 2014-11-11 DIAGNOSIS — R0602 Shortness of breath: Secondary | ICD-10-CM | POA: Diagnosis present

## 2014-11-11 DIAGNOSIS — G4700 Insomnia, unspecified: Secondary | ICD-10-CM | POA: Diagnosis present

## 2014-11-11 DIAGNOSIS — E780 Pure hypercholesterolemia: Secondary | ICD-10-CM | POA: Diagnosis present

## 2014-11-11 DIAGNOSIS — Z87891 Personal history of nicotine dependence: Secondary | ICD-10-CM

## 2014-11-11 DIAGNOSIS — Z86010 Personal history of colonic polyps: Secondary | ICD-10-CM

## 2014-11-11 DIAGNOSIS — J9601 Acute respiratory failure with hypoxia: Secondary | ICD-10-CM

## 2014-11-11 DIAGNOSIS — I509 Heart failure, unspecified: Secondary | ICD-10-CM

## 2014-11-11 DIAGNOSIS — N12 Tubulo-interstitial nephritis, not specified as acute or chronic: Secondary | ICD-10-CM | POA: Diagnosis present

## 2014-11-11 DIAGNOSIS — E039 Hypothyroidism, unspecified: Secondary | ICD-10-CM | POA: Diagnosis present

## 2014-11-11 DIAGNOSIS — F0390 Unspecified dementia without behavioral disturbance: Secondary | ICD-10-CM | POA: Diagnosis present

## 2014-11-11 DIAGNOSIS — I480 Paroxysmal atrial fibrillation: Secondary | ICD-10-CM | POA: Diagnosis present

## 2014-11-11 DIAGNOSIS — I272 Other secondary pulmonary hypertension: Secondary | ICD-10-CM | POA: Diagnosis present

## 2014-11-11 DIAGNOSIS — I252 Old myocardial infarction: Secondary | ICD-10-CM

## 2014-11-11 DIAGNOSIS — M810 Age-related osteoporosis without current pathological fracture: Secondary | ICD-10-CM | POA: Diagnosis present

## 2014-11-11 DIAGNOSIS — I10 Essential (primary) hypertension: Secondary | ICD-10-CM | POA: Diagnosis present

## 2014-11-11 DIAGNOSIS — I4891 Unspecified atrial fibrillation: Secondary | ICD-10-CM

## 2014-11-11 DIAGNOSIS — J810 Acute pulmonary edema: Secondary | ICD-10-CM | POA: Insufficient documentation

## 2014-11-11 DIAGNOSIS — I5033 Acute on chronic diastolic (congestive) heart failure: Secondary | ICD-10-CM | POA: Diagnosis present

## 2014-11-11 HISTORY — DX: Unspecified macular degeneration: H35.30

## 2014-11-11 HISTORY — DX: Legal blindness, as defined in USA: H54.8

## 2014-11-11 HISTORY — DX: Gastro-esophageal reflux disease without esophagitis: K21.9

## 2014-11-11 LAB — COMPREHENSIVE METABOLIC PANEL
ALT: 19 U/L (ref 0–35)
ANION GAP: 15 (ref 5–15)
AST: 23 U/L (ref 0–37)
Albumin: 3 g/dL — ABNORMAL LOW (ref 3.5–5.2)
Alkaline Phosphatase: 79 U/L (ref 39–117)
BUN: 16 mg/dL (ref 6–23)
CALCIUM: 8.7 mg/dL (ref 8.4–10.5)
CO2: 22 mEq/L (ref 19–32)
Chloride: 99 mEq/L (ref 96–112)
Creatinine, Ser: 0.85 mg/dL (ref 0.50–1.10)
GFR calc Af Amer: 68 mL/min — ABNORMAL LOW (ref >90)
GFR calc non Af Amer: 59 mL/min — ABNORMAL LOW (ref >90)
Glucose, Bld: 148 mg/dL — ABNORMAL HIGH (ref 70–99)
Potassium: 3.6 mEq/L — ABNORMAL LOW (ref 3.7–5.3)
SODIUM: 136 meq/L — AB (ref 137–147)
TOTAL PROTEIN: 6.3 g/dL (ref 6.0–8.3)
Total Bilirubin: 0.3 mg/dL (ref 0.3–1.2)

## 2014-11-11 LAB — CBC WITH DIFFERENTIAL/PLATELET
BASOS ABS: 0 10*3/uL (ref 0.0–0.1)
Basophils Relative: 0 % (ref 0–1)
Eosinophils Absolute: 0 10*3/uL (ref 0.0–0.7)
Eosinophils Relative: 0 % (ref 0–5)
HCT: 32.2 % — ABNORMAL LOW (ref 36.0–46.0)
Hemoglobin: 9.2 g/dL — ABNORMAL LOW (ref 12.0–15.0)
LYMPHS PCT: 7 % — AB (ref 12–46)
Lymphs Abs: 0.4 10*3/uL — ABNORMAL LOW (ref 0.7–4.0)
MCH: 21.6 pg — ABNORMAL LOW (ref 26.0–34.0)
MCHC: 28.6 g/dL — ABNORMAL LOW (ref 30.0–36.0)
MCV: 75.6 fL — ABNORMAL LOW (ref 78.0–100.0)
Monocytes Absolute: 0.5 10*3/uL (ref 0.1–1.0)
Monocytes Relative: 8 % (ref 3–12)
Neutro Abs: 5 10*3/uL (ref 1.7–7.7)
Neutrophils Relative %: 85 % — ABNORMAL HIGH (ref 43–77)
PLATELETS: 280 10*3/uL (ref 150–400)
RBC: 4.26 MIL/uL (ref 3.87–5.11)
RDW: 17.6 % — ABNORMAL HIGH (ref 11.5–15.5)
WBC: 6 10*3/uL (ref 4.0–10.5)

## 2014-11-11 LAB — PROTIME-INR
INR: 1.06 (ref 0.00–1.49)
PROTHROMBIN TIME: 13.9 s (ref 11.6–15.2)

## 2014-11-11 LAB — URINE MICROSCOPIC-ADD ON

## 2014-11-11 LAB — TROPONIN I
Troponin I: <0.3 ng/mL (ref <0.30)
Troponin I: <0.3 ng/mL (ref <0.30)

## 2014-11-11 LAB — CBC
HCT: 33.1 % — ABNORMAL LOW (ref 36.0–46.0)
HEMOGLOBIN: 9.4 g/dL — AB (ref 12.0–15.0)
MCH: 21.5 pg — ABNORMAL LOW (ref 26.0–34.0)
MCHC: 28.4 g/dL — ABNORMAL LOW (ref 30.0–36.0)
MCV: 75.6 fL — ABNORMAL LOW (ref 78.0–100.0)
Platelets: 279 10*3/uL (ref 150–400)
RBC: 4.38 MIL/uL (ref 3.87–5.11)
RDW: 17.6 % — AB (ref 11.5–15.5)
WBC: 7.1 10*3/uL (ref 4.0–10.5)

## 2014-11-11 LAB — URINALYSIS, ROUTINE W REFLEX MICROSCOPIC
Bilirubin Urine: NEGATIVE
Glucose, UA: NEGATIVE mg/dL
Hgb urine dipstick: NEGATIVE
Ketones, ur: NEGATIVE mg/dL
Nitrite: NEGATIVE
PROTEIN: NEGATIVE mg/dL
Specific Gravity, Urine: 1.01 (ref 1.005–1.030)
Urobilinogen, UA: 0.2 mg/dL (ref 0.0–1.0)
pH: 6 (ref 5.0–8.0)

## 2014-11-11 LAB — CREATININE, SERUM
Creatinine, Ser: 0.87 mg/dL (ref 0.50–1.10)
GFR, EST AFRICAN AMERICAN: 66 mL/min — AB (ref >90)
GFR, EST NON AFRICAN AMERICAN: 57 mL/min — AB (ref >90)

## 2014-11-11 LAB — PRO B NATRIURETIC PEPTIDE: PRO B NATRI PEPTIDE: 2417 pg/mL — AB (ref 0–450)

## 2014-11-11 LAB — MRSA PCR SCREENING: MRSA BY PCR: NEGATIVE

## 2014-11-11 MED ORDER — ASPIRIN 325 MG PO TABS
325.0000 mg | ORAL_TABLET | Freq: Once | ORAL | Status: AC
  Administered 2014-11-11: 325 mg via ORAL
  Filled 2014-11-11: qty 1

## 2014-11-11 MED ORDER — GUAIFENESIN-DM 100-10 MG/5ML PO SYRP: 5.0000 mL | ORAL_SOLUTION | ORAL | Status: DC | PRN

## 2014-11-11 MED ORDER — FUROSEMIDE 20 MG PO TABS: 20.0000 mg | ORAL_TABLET | Freq: Every day | ORAL | Status: DC

## 2014-11-11 MED ORDER — OCUVITE PRESERVISION PO TABS: 1.0000 | ORAL_TABLET | Freq: Two times a day (BID) | ORAL | Status: DC

## 2014-11-11 MED ORDER — FUROSEMIDE 10 MG/ML IJ SOLN
20.0000 mg | Freq: Once | INTRAMUSCULAR | Status: AC
  Administered 2014-11-11: 20 mg via INTRAVENOUS
  Filled 2014-11-11: qty 2

## 2014-11-11 MED ORDER — POTASSIUM CHLORIDE CRYS ER 20 MEQ PO TBCR
40.0000 meq | EXTENDED_RELEASE_TABLET | Freq: Once | ORAL | Status: AC
  Administered 2014-11-11: 40 meq via ORAL
  Filled 2014-11-11: qty 2

## 2014-11-11 MED ORDER — SODIUM CHLORIDE 0.9 % IJ SOLN
3.0000 mL | Freq: Two times a day (BID) | INTRAMUSCULAR | Status: DC
  Administered 2014-11-11 – 2014-11-12 (×2): 3 mL via INTRAVENOUS

## 2014-11-11 MED ORDER — SODIUM CHLORIDE 0.9 % IJ SOLN
3.0000 mL | Freq: Two times a day (BID) | INTRAMUSCULAR | Status: DC
  Administered 2014-11-11 – 2014-11-13 (×2): 3 mL via INTRAVENOUS

## 2014-11-11 MED ORDER — ALBUTEROL SULFATE (2.5 MG/3ML) 0.083% IN NEBU
2.5000 mg | INHALATION_SOLUTION | RESPIRATORY_TRACT | Status: DC | PRN
  Administered 2014-11-12 (×2): 2.5 mg via RESPIRATORY_TRACT
  Filled 2014-11-11 (×2): qty 3

## 2014-11-11 MED ORDER — ACETAMINOPHEN 650 MG RE SUPP: 650.0000 mg | Freq: Four times a day (QID) | RECTAL | Status: DC | PRN

## 2014-11-11 MED ORDER — DILTIAZEM LOAD VIA INFUSION
20.0000 mg | Freq: Once | INTRAVENOUS | Status: AC
  Administered 2014-11-11: 20 mg via INTRAVENOUS
  Filled 2014-11-11: qty 20

## 2014-11-11 MED ORDER — OMEGA-3-ACID ETHYL ESTERS 1 G PO CAPS
1.0000 g | ORAL_CAPSULE | Freq: Two times a day (BID) | ORAL | Status: DC
  Administered 2014-11-11 – 2014-11-17 (×12): 1 g via ORAL
  Filled 2014-11-11 (×13): qty 1

## 2014-11-11 MED ORDER — LEVOTHYROXINE SODIUM 25 MCG PO TABS
25.0000 ug | ORAL_TABLET | Freq: Every day | ORAL | Status: DC
  Administered 2014-11-12 – 2014-11-18 (×6): 25 ug via ORAL
  Filled 2014-11-11 (×8): qty 1

## 2014-11-11 MED ORDER — HEPARIN SODIUM (PORCINE) 5000 UNIT/ML IJ SOLN
5000.0000 [IU] | Freq: Three times a day (TID) | INTRAMUSCULAR | Status: DC
  Administered 2014-11-11 – 2014-11-18 (×21): 5000 [IU] via SUBCUTANEOUS
  Filled 2014-11-11 (×27): qty 1

## 2014-11-11 MED ORDER — PANTOPRAZOLE SODIUM 40 MG PO TBEC
40.0000 mg | DELAYED_RELEASE_TABLET | Freq: Every day | ORAL | Status: DC
  Administered 2014-11-11 – 2014-11-14 (×4): 40 mg via ORAL
  Filled 2014-11-11 (×4): qty 1

## 2014-11-11 MED ORDER — DILTIAZEM HCL 100 MG IV SOLR
5.0000 mg/h | INTRAVENOUS | Status: DC
  Administered 2014-11-11 – 2014-11-12 (×2): 5 mg/h via INTRAVENOUS
  Filled 2014-11-11: qty 100

## 2014-11-11 MED ORDER — DILTIAZEM HCL 100 MG IV SOLR
5.0000 mg/h | INTRAVENOUS | Status: DC
  Administered 2014-11-11: 10 mg/h via INTRAVENOUS
  Filled 2014-11-11: qty 100

## 2014-11-11 MED ORDER — LOSARTAN POTASSIUM 50 MG PO TABS
50.0000 mg | ORAL_TABLET | Freq: Every day | ORAL | Status: DC
  Administered 2014-11-11 – 2014-11-12 (×2): 50 mg via ORAL
  Filled 2014-11-11 (×2): qty 1

## 2014-11-11 MED ORDER — SODIUM CHLORIDE 0.9 % IJ SOLN: 3.0000 mL | INTRAMUSCULAR | Status: DC | PRN

## 2014-11-11 MED ORDER — ACETAMINOPHEN 325 MG PO TABS
650.0000 mg | ORAL_TABLET | Freq: Four times a day (QID) | ORAL | Status: DC | PRN
  Administered 2014-11-16 – 2014-11-17 (×2): 650 mg via ORAL
  Filled 2014-11-11 (×3): qty 2

## 2014-11-11 MED ORDER — ASPIRIN 81 MG PO CHEW
81.0000 mg | CHEWABLE_TABLET | Freq: Every day | ORAL | Status: DC
  Administered 2014-11-12 – 2014-11-18 (×7): 81 mg via ORAL
  Filled 2014-11-11 (×6): qty 1

## 2014-11-11 MED ORDER — CETYLPYRIDINIUM CHLORIDE 0.05 % MT LIQD
7.0000 mL | Freq: Two times a day (BID) | OROMUCOSAL | Status: DC
  Administered 2014-11-11 – 2014-11-18 (×14): 7 mL via OROMUCOSAL

## 2014-11-11 MED ORDER — OXYCODONE HCL 5 MG PO TABS: 5.0000 mg | ORAL_TABLET | ORAL | Status: DC | PRN

## 2014-11-11 MED ORDER — ZOLPIDEM TARTRATE 5 MG PO TABS
5.0000 mg | ORAL_TABLET | Freq: Every evening | ORAL | Status: DC | PRN
  Administered 2014-11-11 – 2014-11-17 (×6): 5 mg via ORAL
  Filled 2014-11-11 (×6): qty 1

## 2014-11-11 MED ORDER — ONDANSETRON HCL 4 MG/2ML IJ SOLN
4.0000 mg | Freq: Four times a day (QID) | INTRAMUSCULAR | Status: DC | PRN
  Administered 2014-11-11: 4 mg via INTRAVENOUS
  Filled 2014-11-11: qty 2

## 2014-11-11 MED ORDER — OMEGA-3 FATTY ACIDS 1000 MG PO CAPS: 1000.0000 mg | ORAL_CAPSULE | Freq: Every day | ORAL | Status: DC

## 2014-11-11 MED ORDER — FUROSEMIDE 40 MG PO TABS
40.0000 mg | ORAL_TABLET | Freq: Every day | ORAL | Status: DC
  Administered 2014-11-12: 40 mg via ORAL
  Filled 2014-11-11: qty 1

## 2014-11-11 MED ORDER — ONDANSETRON HCL 4 MG PO TABS
4.0000 mg | ORAL_TABLET | Freq: Four times a day (QID) | ORAL | Status: DC | PRN
  Administered 2014-11-13 (×2): 4 mg via ORAL
  Filled 2014-11-11 (×2): qty 1

## 2014-11-11 MED ORDER — SODIUM CHLORIDE 0.9 % IV SOLN: 250.0000 mL | INTRAVENOUS | Status: DC | PRN

## 2014-11-11 MED ORDER — DOCUSATE SODIUM 100 MG PO CAPS
100.0000 mg | ORAL_CAPSULE | Freq: Two times a day (BID) | ORAL | Status: DC
  Administered 2014-11-11 – 2014-11-18 (×11): 100 mg via ORAL
  Filled 2014-11-11 (×15): qty 1

## 2014-11-11 NOTE — ED Notes (Signed)
Pt removed from Bi-pap per EDP, placed on 4 L.  Pt tolerating well.  No further action needed at this time.

## 2014-11-11 NOTE — ED Notes (Signed)
Per [facility] pt complained of SOB since yesterday.  When EMS arrived O2 sats were upper 90s but pt was tachypneic.  HR Afib RVR 150s.  Pt had been given cardizem pills to take if palpitations were felt, however she did not feel palpitations.Denies chest pain.

## 2014-11-11 NOTE — ED Provider Notes (Signed)
CSN: [id number]     Arrival date & time 11/11/14  1002 History   First MD Initiated Contact with Patient 11/11/14 1010     Chief Complaint  Patient presents with  . Respiratory Distress  . Tachycardia     (Consider location/radiation/quality/duration/timing/severity/associated sxs/prior Treatment) HPI The patient started developing increasing shortness of breath yesterday. She for she has had a mild cough but no associated fever. Today it got significantly worse. The patient denies any associated chest pain or syncope. There is no associated lower extremity swelling. The patient does have a history of atrial fibrillation. EMS applied BiPAP prior to arrival for respiratory distress. Past Medical History  Diagnosis Date  . Atrial fibrillation 04/23-24/2007    a. recurrent PAF with RVR in September 2013. b. Evaluated 03/2013, previously intolerant to Norpace and Amiodarone - consider Multaq if recurs. c. Not on anticoag due to history of falls and also some internal bleeding per son.  . Hypertrophic cardiomyopathy   . Urinary incontinence   . Diverticulosis of colon (without mention of hemorrhage) 2003/ 08/2000    EGD/colonoscopy Barretts esophagus//H.H divertics 08/2000  . Cervical mass     C2 lateral mass fracture  . Hypertension   . Hypercholesterolemia     219/497  . Hypothyroidism   . Multinodular goiter (nontoxic)   . Osteoporosis   . Blood transfusion   . Jaundice ~ 1935    "in grade school"  . Degenerative joint disease     back  . Depression     "husband died Feb 14, 2011"  . Anemia, iron deficiency   . Personal history of colonic polyps 02/29/2012    tubular adenoma  . Barrett's esophagus   . CAD (coronary artery disease)     a. NSTEMI 03/2013: secondary to diagonal disease (small, not amenable to PCI, for med rx).  . Moderate mitral regurgitation 2014  . Mild aortic stenosis 2014  . Lung nodule seen on imaging study, pt does not wish further work up 02/08/2014  . Mitral  valve regurgitation, mod to severe with moderately calcified annulus 02/08/2014  . Mitral valve mass, density - no fevers to suggest endocarditis 02/08/2014  . HOH (hard of hearing)   . Shortness of breath   . CHF (congestive heart failure)    Past Surgical History  Procedure Laterality Date  . Bladder surgery      bladder tack early 90's  . Tear duct probing  07/29/03    tear duct surg  . Cystourethroscopy  09/17/03  . Rotator cuff repair  ? date; 09/07/05    left; right( Dr. Gladstone Lighter)  . Appendectomy  1941  . Breast surgery  1981    breast reduction  . Eye surgery  03/2002    cataract OS  . Thyroid ultrasound  10/14/2003    MNG, no dominant masses  . Doppler echocardiography  03/05/2002&09/11/2003    ECHO, EF wnl, mild stenosis, A.S. mild MR, Mild T.R03/31/2003//ECHO EF 70%,LVH, ?diast dysfunction 09/11/2003  . Cataract extraction w/ intraocular lens  implant, bilateral  2003  . Dilation and curettage of uterus  09/07/2000    endometrial polyps removed, path all benign   . Tonsillectomy and adenoidectomy      "as a child"  . Fracture surgery  2010    right knee   Family History  Problem Relation Age of Onset  . Heart failure Mother     CHF, DM, HBP  . Hypertension Mother   . Uterine cancer Mother   . Stroke  Mother   . Colon cancer Neg Hx   . Esophageal cancer Neg Hx   . Rectal cancer Neg Hx   . Stomach cancer Neg Hx    History  Substance Use Topics  . Smoking status: Former Smoker -- 0.50 packs/day for 4 years    Types: Cigarettes    Quit date: 07/06/1974  . Smokeless tobacco: Never Used  . Alcohol Use: No   OB History    No data available     Review of Systems 10 Systems reviewed and are negative for acute change except as noted in the HPI.    Allergies  Lipitor; Morphine and related; Irbesartan; Ramipril; Telmisartan-hctz; Amiodarone; Hydrochlorothiazide; Metoprolol; Other; Tikosyn; and Norpace  Home Medications   Prior to Admission medications    Medication Sig Start Date End Date Taking? Authorizing Provider  aspirin 81 MG chewable tablet Chew 1 tablet (81 mg total) by mouth daily. 03/29/13   Dayna N Dunn, PA-C  BESIVANCE 0.6 % SUSP Place 1 drop into both eyes 2 (two) times daily.  12/21/12   Historical Provider, MD  diltiazem (CARDIZEM CD) 180 MG 24 hr capsule Take 1 capsule (180 mg total) by mouth daily. 10/07/14   Erlene Quan, PA-C  diltiazem (CARDIZEM) 30 MG tablet Take 1 tablet (30 mg total) by mouth every 6 (six) hours as needed (rapid heart rate.). 09/18/14   Almyra Deforest, PA  docusate sodium (COLACE) 100 MG capsule Take 100 mg by mouth daily as needed for mild constipation.    Historical Provider, MD  fish oil-omega-3 fatty acids 1000 MG capsule Take 1,000 mg by mouth daily.     Historical Provider, MD  furosemide (LASIX) 20 MG tablet Take 20 mg by mouth daily. Take an extra tab daily as needed for fluid (if weight gain >3lbs). 06/26/14   Modena Jansky, MD  HYDROcodone-acetaminophen (NORCO/VICODIN) 5-325 MG per tablet Take 0.5-1 tablets by mouth 2 (two) times daily as needed for moderate pain.    Historical Provider, MD  levothyroxine (SYNTHROID, LEVOTHROID) 25 MCG tablet Take 1 tablet (25 mcg total) by mouth every morning. 10/10/14   Tonia Ghent, MD  losartan (COZAAR) 50 MG tablet Take 50 mg by mouth daily.  09/04/14   Historical Provider, MD  Multiple Vitamins-Minerals (OCUVITE PRESERVISION) TABS Take 1 tablet by mouth 2 (two) times daily.     Historical Provider, MD  omeprazole (PRILOSEC) 40 MG capsule Take 40 mg by mouth daily.    Historical Provider, MD  zolpidem (AMBIEN) 5 MG tablet Take 1 tablet (5 mg total) by mouth at bedtime as needed for sleep. 09/18/14   Tonia Ghent, MD   BP 148/86 mmHg  Pulse 129  Temp(Src) 97.3 F (36.3 C) (Axillary)  Resp 27  Ht 4\' 9"  (1.448 m)  Wt 124 lb (56.246 kg)  BMI 26.83 kg/m2  SpO2 100% Physical Exam  Constitutional: She is oriented to person, place, and time. She appears  well-developed and well-nourished.  The patient arrives with the BiPAP on in moderate respiratory distress. Her color is good. She is not diaphoretic. She is awake and alert.  HENT:  Head: Normocephalic and atraumatic.  Eyes: EOM are normal. Right eye exhibits no discharge. Left eye exhibits no discharge.  Neck: Neck supple. JVD present.  Cardiovascular: Intact distal pulses.   The patient is tachycardic with heart rate irregularly irregular. Monitor reads from 110 to 150s.  Pulmonary/Chest:  Moderate increased work of breathing. Breath sounds are soft throughout however no  gross rails or wheeze.  Abdominal: Soft. There is no tenderness.  Musculoskeletal: Normal range of motion. She exhibits no edema or tenderness.  Neurological: She is alert and oriented to person, place, and time.  Skin: Skin is warm and dry.  Psychiatric: She has a normal mood and affect.    ED Course  Procedures (including critical care time) Labs Review Labs Reviewed  COMPREHENSIVE METABOLIC PANEL - Abnormal; Notable for the following:    Sodium 136 (*)    Potassium 3.6 (*)    Glucose, Bld 148 (*)    Albumin 3.0 (*)    GFR calc non Af Amer 59 (*)    GFR calc Af Amer 68 (*)    All other components within normal limits  PRO B NATRIURETIC PEPTIDE - Abnormal; Notable for the following:    Pro B Natriuretic peptide (BNP) 2417.0 (*)    All other components within normal limits  CBC WITH DIFFERENTIAL - Abnormal; Notable for the following:    Hemoglobin 9.2 (*)    HCT 32.2 (*)    MCV 75.6 (*)    MCH 21.6 (*)    MCHC 28.6 (*)    RDW 17.6 (*)    Neutrophils Relative % 85 (*)    Lymphocytes Relative 7 (*)    Lymphs Abs 0.4 (*)    All other components within normal limits  CULTURE, BLOOD (ROUTINE X 2)  CULTURE, BLOOD (ROUTINE X 2)  TROPONIN I  PROTIME-INR  URINALYSIS, ROUTINE W REFLEX MICROSCOPIC    Imaging Review Dg Chest Port 1 View  11/11/2014   CLINICAL DATA:  Respiratory distress and tachycardia  today.  EXAM: PORTABLE CHEST - 1 VIEW  COMPARISON:  September 16, 2014  FINDINGS: The mediastinal contour is normal. The heart size is enlarged. There is increased interstitium in bilateral lung. There is no focal pneumonia. There is probably minimal bilateral pleural effusions. The visualized skeletal structures are stable P ower  IMPRESSION: Cardiomegaly with interstitial pulmonary edema.   Electronically Signed   By: Abelardo Diesel M.D.   On: 11/11/2014 11:05     EKG Interpretation None     CRITICAL CARE Performed by: Charlesetta Shanks   Total critical care time: 40  Critical care time was exclusive of separately billable procedures and treating other patients.  Critical care was necessary to treat or prevent imminent or life-threatening deterioration.  Critical care was time spent personally by me on the following activities: development of treatment plan with patient and/or surrogate as well as nursing, discussions with consultants, evaluation of patient's response to treatment, examination of patient, obtaining history from patient or surrogate, ordering and performing treatments and interventions, ordering and review of laboratory studies, ordering and review of radiographic studies, pulse oximetry and re-evaluation of patient's condition. MDM   Final diagnoses:  Atrial fibrillation with rapid ventricular response  Acute on chronic congestive heart failure, unspecified congestive heart failure type   The patient presents with atrial fibrillation with rapid ventricular response. She is tolerating the BiPAP as it was applied by EMS. BiPAP was continued and patient was treated with Cardizem 20 mg bolus and drip. Patient responded well to this. She was able to be taken off BiPAP and placed on supplemental nasal cannula oxygen. Chest x-ray and BNP are suggestive of congestive heart failure. At this point the patient will be admitted for ongoing treatment for congestive heart failure with A. fib  RVR    Charlesetta Shanks, MD 11/11/14 1335

## 2014-11-11 NOTE — Progress Notes (Signed)
Pt came in from EMS on cpap with respiratory distress. Pt was placed on bipap.Pt states the bipap does not help her breathing but MD wants to give it a little time to see if it will help due to tachycardia/afib.

## 2014-11-11 NOTE — Progress Notes (Signed)
Pt taken off bipap and placed on 4L [place] per Pt's request. Denies SOB at this time.

## 2014-11-11 NOTE — H&P (Signed)
PATIENT DETAILS Name: Cindy [name] Age: 80 y.o. Sex: female Date of Birth: 1921-12-20 Admit Date: 11/11/2014 [id number] Damita Dunnings, MD   CHIEF COMPLAINT:  Shortness of breath  HPI: Cindy [name] is a 80 y.o. female with a Past Medical History of HOCM, history of CAD not amenable to PCI, atrial fibrillation, chronic diastolic heart failure, moderate to severe mitral regurgitation, pulmonary hypertension who presents today with the above noted complaint. Patient claims that for the past week associated slowly progressing shortness of breath, for the past 2 days shortness of breath has been particularly worse. She also claims that she has had "fluttering" in her chest. EMS was called, patient was found to be in acute respiratory distress and placed on CPAP, subsequently placed on BiPAP in the emergency room. I was subsequently asked to admit this patient for further evaluation and treatment. During my evaluation, patient had been removed off BiPAP, was given 20 mg IV Lasix and was placed on a Cardizem drip. She denies any headache, fever, chest pain, nausea, vomiting, diarrhea or abdominal pain.  ALLERGIES:   Allergies  Allergen Reactions  . Lipitor [Atorvastatin] Nausea Only and Other (See Comments)    LFT elevation  . Morphine And Related Other (See Comments)    "drives me crazy" and hyperactivity  . Irbesartan Swelling  . Ramipril Swelling    REACTION: lips swelling  . Telmisartan-Hctz Other (See Comments)    REACTION: incontinence  . Amiodarone Other (See Comments)     unknown  . Hydrochlorothiazide Other (See Comments)    hyponatremia  . Metoprolol Other (See Comments)    Headache, dizzy, "terribly sick"  . Other Other (See Comments)    ANTICOAGULANTS - not a candidate due to history of falls and bleeding  . Tikosyn [Dofetilide] Other (See Comments)    Not a candidate due to Prolonged QT  . Norpace [Disopyramide] Other (See Comments)    Dry mouth    PAST MEDICAL  HISTORY: Past Medical History  Diagnosis Date  . Atrial fibrillation 04/23-24/2007    a. recurrent PAF with RVR in September 2013. b. Evaluated 03/2013, previously intolerant to Norpace and Amiodarone - consider Multaq if recurs. c. Not on anticoag due to history of falls and also some internal bleeding per son.  . Hypertrophic cardiomyopathy   . Urinary incontinence   . Diverticulosis of colon (without mention of hemorrhage) 2003/ 08/2000    EGD/colonoscopy Barretts esophagus//H.H divertics 08/2000  . Cervical mass     C2 lateral mass fracture  . Hypertension   . Hypercholesterolemia     219/497  . Hypothyroidism   . Multinodular goiter (nontoxic)   . Osteoporosis   . Blood transfusion   . Jaundice ~ 1935    "in grade school"  . Degenerative joint disease     back  . Depression     "husband died 2011-02-13"  . Anemia, iron deficiency   . Personal history of colonic polyps 02/29/2012    tubular adenoma  . Barrett's esophagus   . CAD (coronary artery disease)     a. NSTEMI 03/2013: secondary to diagonal disease (small, not amenable to PCI, for med rx).  . Moderate mitral regurgitation 2014  . Mild aortic stenosis 2014  . Lung nodule seen on imaging study, pt does not wish further work up 02/08/2014  . Mitral valve regurgitation, mod to severe with moderately calcified annulus 02/08/2014  . Mitral valve mass, density - no fevers to suggest endocarditis 02/08/2014  .  HOH (hard of hearing)   . Shortness of breath   . CHF (congestive heart failure)     PAST SURGICAL HISTORY: Past Surgical History  Procedure Laterality Date  . Bladder surgery      bladder tack early 90's  . Tear duct probing  07/29/03    tear duct surg  . Cystourethroscopy  09/17/03  . Rotator cuff repair  ? date; 09/07/05    left; right( Dr. Gladstone Lighter)  . Appendectomy  1941  . Breast surgery  1981    breast reduction  . Eye surgery  03/2002    cataract OS  . Thyroid ultrasound  10/14/2003    MNG, no dominant  masses  . Doppler echocardiography  03/05/2002&09/11/2003    ECHO, EF wnl, mild stenosis, A.S. mild MR, Mild T.R03/31/2003//ECHO EF 70%,LVH, ?diast dysfunction 09/11/2003  . Cataract extraction w/ intraocular lens  implant, bilateral  2003  . Dilation and curettage of uterus  09/07/2000    endometrial polyps removed, path all benign   . Tonsillectomy and adenoidectomy      "as a child"  . Fracture surgery  2010    right knee    MEDICATIONS AT HOME: Prior to Admission medications   Medication Sig Start Date End Date Taking? Authorizing Provider  aspirin 81 MG chewable tablet Chew 1 tablet (81 mg total) by mouth daily. 03/29/13   Dayna N Dunn, PA-C  BESIVANCE 0.6 % SUSP Place 1 drop into both eyes 2 (two) times daily.  12/21/12   Historical Provider, MD  diltiazem (CARDIZEM CD) 180 MG 24 hr capsule Take 1 capsule (180 mg total) by mouth daily. 10/07/14   Erlene Quan, PA-C  diltiazem (CARDIZEM) 30 MG tablet Take 1 tablet (30 mg total) by mouth every 6 (six) hours as needed (rapid heart rate.). 09/18/14   Almyra Deforest, PA  docusate sodium (COLACE) 100 MG capsule Take 100 mg by mouth daily as needed for mild constipation.    Historical Provider, MD  fish oil-omega-3 fatty acids 1000 MG capsule Take 1,000 mg by mouth daily.     Historical Provider, MD  furosemide (LASIX) 20 MG tablet Take 20 mg by mouth daily. Take an extra tab daily as needed for fluid (if weight gain >3lbs). 06/26/14   Modena Jansky, MD  HYDROcodone-acetaminophen (NORCO/VICODIN) 5-325 MG per tablet Take 0.5-1 tablets by mouth 2 (two) times daily as needed for moderate pain.    Historical Provider, MD  levothyroxine (SYNTHROID, LEVOTHROID) 25 MCG tablet Take 1 tablet (25 mcg total) by mouth every morning. 10/10/14   Tonia Ghent, MD  losartan (COZAAR) 50 MG tablet Take 50 mg by mouth daily.  09/04/14   Historical Provider, MD  Multiple Vitamins-Minerals (OCUVITE PRESERVISION) TABS Take 1 tablet by mouth 2 (two) times daily.      Historical Provider, MD  omeprazole (PRILOSEC) 40 MG capsule Take 40 mg by mouth daily.    Historical Provider, MD  zolpidem (AMBIEN) 5 MG tablet Take 1 tablet (5 mg total) by mouth at bedtime as needed for sleep. 09/18/14   Tonia Ghent, MD    FAMILY HISTORY: Family History  Problem Relation Age of Onset  . Heart failure Mother     CHF, DM, HBP  . Hypertension Mother   . Uterine cancer Mother   . Stroke Mother   . Colon cancer Neg Hx   . Esophageal cancer Neg Hx   . Rectal cancer Neg Hx   . Stomach cancer Neg Hx  SOCIAL HISTORY:  reports that she quit smoking about 40 years ago. Her smoking use included Cigarettes. She has a 2 pack-year smoking history. She has never used smokeless tobacco. She reports that she does not drink alcohol or use illicit drugs.  REVIEW OF SYSTEMS:  Constitutional:   No  weight loss, night sweats,  Fevers, chills, fatigue.  HEENT:    No headaches, Difficulty swallowing,Tooth/dental problems,Sore throat    Cardio-vascular: No chest pain,  Orthopnea, PND, swelling in lower extremities, anasarca, dizziness, palpitations  GI:  No heartburn, indigestion, abdominal pain, nausea, vomiting, diarrhea, change in  bowel habits, loss of appetite  Resp: No shortness of breath with exertion or at rest.  No excess mucus, no productive cough, No non-productive cough,  No coughing up of blood.No change in color of mucus.No wheezing.  Skin:  no rash or lesions.  GU:  no dysuria, change in color of urine, no urgency or frequency.  No flank pain.  Musculoskeletal: No joint pain or swelling.  No decreased range of motion.  No back pain.  Psych: No change in mood or affect. No depression or anxiety.  No memory loss.   PHYSICAL EXAM: Blood pressure 148/86, pulse 129, temperature 97.3 F (36.3 C), temperature source Axillary, resp. rate 27, height 4\' 9"  (1.448 m), weight 56.246 kg (124 lb), SpO2 100 %.  General appearance :Awake, alert, not in any  distress. Speech Clear. Not toxic Looking HEENT: Atraumatic and Normocephalic, pupils equally reactive to light and accomodation Neck: supple, no JVD. No cervical lymphadenopathy.  Chest:Good air entry bilaterally, bibasilar rales  CVS: S1 S2 regular, no murmurs.  Abdomen: Bowel sounds present, Non tender and not distended with no gaurding, rigidity or rebound. Extremities: B/L Lower Ext shows no edema, both legs are warm to touch Neurology: Awake alert, and oriented X 3, CN II-XII intact, Non focal Skin:No Rash Wounds:N/A  LABS ON ADMISSION:   Recent Labs  11/11/14 1028  NA 136*  K 3.6*  CL 99  CO2 22  GLUCOSE 148*  BUN 16  CREATININE 0.85  CALCIUM 8.7    Recent Labs  11/11/14 1028  AST 23  ALT 19  ALKPHOS 79  BILITOT 0.3  PROT 6.3  ALBUMIN 3.0*   No results for input(s): LIPASE, AMYLASE in the last 72 hours.  Recent Labs  11/11/14 1028  WBC 6.0  NEUTROABS 5.0  HGB 9.2*  HCT 32.2*  MCV 75.6*  PLT 280    Recent Labs  11/11/14 1028  TROPONINI <0.30   No results for input(s): DDIMER in the last 72 hours. Invalid input(s): POCBNP   RADIOLOGIC STUDIES ON ADMISSION: Dg Chest Port 1 View  11/11/2014   CLINICAL DATA:  Respiratory distress and tachycardia today.  EXAM: PORTABLE CHEST - 1 VIEW  COMPARISON:  September 16, 2014  FINDINGS: The mediastinal contour is normal. The heart size is enlarged. There is increased interstitium in bilateral lung. There is no focal pneumonia. There is probably minimal bilateral pleural effusions. The visualized skeletal structures are stable P ower  IMPRESSION: Cardiomegaly with interstitial pulmonary edema.   Electronically Signed   By: Abelardo Diesel M.D.   On: 11/11/2014 11:05     EKG: Independently reviewed. Afib RVR  ASSESSMENT AND PLAN: Present on Admission:  . Atrial fibrillation with RVR: Continue Cardizem drip. Given her advanced age, history of falls suspect not a candidate for long-term anticoagulation. Have  consulted cardiology, patient has not tolerated amiodarone,Tikosyn and other agents in the past  .  Acute diastolic CHF (congestive heart failure): Has history of  HOCM-will need gentle diuresis and is preload dependent.  Continue cautiously with Lasix, I have consulted cardiology for further assistance.  . Acute respiratory failure: Resolving with intravenous Lasix. Likely secondary to acute diastolic heart failure. Previously required BiPAP support. Titrate off oxygen as tolerated.   Marland Kitchen CAD (coronary artery disease): Continue aspirin, her last cardiology note, not a candidate for PCI. We'll need to manage medically.   . Essential hypertension: Continue losartan, will be on Cardizem infusion and Lasix. Follow BP and adjust medications accordingly.   Marland Kitchen Hypertrophic obstructive cardiomyopathy: Follow. Will need to be careful with preload reduction. Audiology to help in management of CHF  . Pulmonary hypertension:   . Insomnia: Continue Ambien   Further plan will depend as patient's clinical course evolves and further radiologic and laboratory data become available. Patient will be monitored closely.  Above noted plan was discussed with patient/son-Mike, they were in agreement.   DVT Prophylaxis: Prophylactic Heparin  Code Status: DNR-confirmed with patient and son at bedside  Disposition Plan: SNF-patient apparently living alone, family was already looking into having patient placed in a SNF. Will obtain PT eval and social worker evaluation.  Total time spent for admission equals 45 minutes.  [facility] Pager [phone]  If 7PM-7AM, please contact night-coverage [email or web address] Password [email or web address] 11/11/2014, 1:53 PM

## 2014-11-11 NOTE — Care Management Note (Addendum)
    Page 1 of 2   11/18/2014     4:14:24 PM CARE MANAGEMENT NOTE 11/18/2014  Patient:  Cindy [name],Cindy [name]   Account Number:  [id number]  Date Initiated:  11/11/2014  Documentation initiated by:  Elissa Hefty  Subjective/Objective Assessment:   adm w at fib     Action/Plan:   discharge planning   Anticipated DC Date:  11/14/2014   Anticipated DC Plan:  [place] referral  Clinical Social Worker      DC Planning Services  CM consult      Choice offered to / List presented to:             Status of service:  Completed, signed off Medicare Important Message given?  YES (If response is "NO", the following Medicare IM given date fields will be blank) Date Medicare IM given:  11/14/2014 Medicare IM given by:  Kiante Ciavarella Date Additional Medicare IM given:  11/18/2014 Additional Medicare IM given by:  Gao Mitnick  Discharge Disposition:  [place]  Per UR Regulation:  Reviewed for med. necessity/level of care/duration of stay  If discussed at [place] of Stay Meetings, dates discussed:    Comments:   11/18/14 Ellan Lambert, RN, BSN [phone] Met with pt's son Cindy [name] and CSW to discuss disposition.  He states family unable to provide 24h care at home, and pt/family would prefer pt to go to [facility], as previously discussed.  CSW to have palliative care to follow pt at SNF--son agreeable to this, and knows that should pt decline, Hospice will be readily available to provide services, if desired.  Son happy with this plan, and appreciated efforts of all involved.  11/17/14 15:00 CM received a call from Cindy [name] bc he received call from his mother stating a misunderstanding of this morning's home hospice exploration.  THE PLAN IS FOR CSW TO CONTACT MIKE TO DISCUSS RESIDENTIAL OPTIONS SO MIKE CAN MAKE A DECISION.  No other CM needs were communicated. Mariane Masters, BSn, [place].  11/17/14 11:00 CM received call from Palliative MD  stating pt's son would like to explore options for hospice (home vs. residential).  CM called son, Cindy [name] [phone] and we spoke at length to the support he could expect from [facility].  Cindy [name] stated he thinks it would be best to also speak with CSW to explore Residential Hospice before making a decision.  Cindy [name] requested I call CSW to have them call him for arrangements.  CM called referral to CSW, Cindy [name].  No other CM needs were communicated.  Mariane Masters, BSN, [place].  11/14/14 Ellan Lambert, RN, BSN [phone] PT recommending SNF at dc; met with pt and son to discuss dc plans.  Son prefers [facility], if possible, as pt has been there in the past. Updated CSW of our conversation. CSW to facilitate dc to SNF when medically stable.  12/7 1552 debbie dowell rn,bsn saw note that son would like snf at [facility]. sw ref made.

## 2014-11-11 NOTE — Progress Notes (Signed)
Unable to scan the cardizem drip- stated it was locked out. Assist and pharmacy notified. Hung new bag at 1830 at rate of 4ml/hr

## 2014-11-11 NOTE — Consult Note (Signed)
CARDIOLOGY CONSULT NOTE   Patient ID: Cindy [name] MRN: [id number], DOB/AGE: 78/11/1922   Admit date: 11/11/2014 Date of Consult: 11/11/2014   Primary Physician: Elsie Stain, MD Primary Cardiologist: Dr. Martinique  Pt. Profile   frail 80 year old female with past medical history of HCOM, CAD, history of NSTEMI secondary to small coronary vessel disease that is not amenable to PCI and in the setting of A. fib with RVR, PAF, chronic diastolic heart failure and moderate to severe mitral regurg who present with SOB x 1 day and found to have recurrent a-fib with RVR  Problem List  Past Medical History  Diagnosis Date  . Atrial fibrillation 04/23-24/2007    a. recurrent PAF with RVR in September 2013. b. Evaluated 03/2013, previously intolerant to Norpace and Amiodarone - consider Multaq if recurs. c. Not on anticoag due to history of falls and also some internal bleeding per son.  . Hypertrophic cardiomyopathy   . Urinary incontinence   . Diverticulosis of colon (without mention of hemorrhage) 2003/ 08/2000    EGD/colonoscopy Barretts esophagus//H.H divertics 08/2000  . Cervical mass     C2 lateral mass fracture  . Hypertension   . Hypercholesterolemia     219/497  . Hypothyroidism   . Multinodular goiter (nontoxic)   . Osteoporosis   . Blood transfusion   . Jaundice ~ 1935    "in grade school"  . Degenerative joint disease     back  . Depression     "husband died Feb 13, 2011"  . Anemia, iron deficiency   . Personal history of colonic polyps 02/29/2012    tubular adenoma  . Barrett's esophagus   . CAD (coronary artery disease)     a. NSTEMI 03/2013: secondary to diagonal disease (small, not amenable to PCI, for med rx).  . Moderate mitral regurgitation 2014  . Mild aortic stenosis 2014  . Lung nodule seen on imaging study, pt does not wish further work up 02/08/2014  . Mitral valve regurgitation, mod to severe with moderately calcified annulus 02/08/2014  . Mitral valve mass,  density - no fevers to suggest endocarditis 02/08/2014  . HOH (hard of hearing)   . Shortness of breath   . CHF (congestive heart failure)     Past Surgical History  Procedure Laterality Date  . Bladder surgery      bladder tack early 90's  . Tear duct probing  07/29/03    tear duct surg  . Cystourethroscopy  09/17/03  . Rotator cuff repair  ? date; 09/07/05    left; right( Dr. Gladstone Lighter)  . Appendectomy  1941  . Breast surgery  1981    breast reduction  . Eye surgery  03/2002    cataract OS  . Thyroid ultrasound  10/14/2003    MNG, no dominant masses  . Doppler echocardiography  03/05/2002&09/11/2003    ECHO, EF wnl, mild stenosis, A.S. mild MR, Mild T.R03/31/2003//ECHO EF 70%,LVH, ?diast dysfunction 09/11/2003  . Cataract extraction w/ intraocular lens  implant, bilateral  2003  . Dilation and curettage of uterus  09/07/2000    endometrial polyps removed, path all benign   . Tonsillectomy and adenoidectomy      "as a child"  . Fracture surgery  2010    right knee     Allergies  Allergies  Allergen Reactions  . Lipitor [Atorvastatin] Nausea Only and Other (See Comments)    LFT elevation  . Morphine And Related Other (See Comments)    "drives me crazy" and hyperactivity  .  Irbesartan Swelling  . Ramipril Swelling    REACTION: lips swelling  . Telmisartan-Hctz Other (See Comments)    REACTION: incontinence  . Amiodarone Other (See Comments)     unknown  . Hydrochlorothiazide Other (See Comments)    hyponatremia  . Metoprolol Other (See Comments)    Headache, dizzy, "terribly sick"  . Other Other (See Comments)    ANTICOAGULANTS - not a candidate due to history of falls and bleeding  . Tikosyn [Dofetilide] Other (See Comments)    Not a candidate due to Prolonged QT  . Norpace [Disopyramide] Other (See Comments)    Dry mouth    HPI   The patient is a frail 80 year old female with past medical history of HCOM, CAD, history of NSTEMI secondary to small coronary  vessel disease that is not amenable to PCI and in the setting of A. fib with RVR, PAF, chronic diastolic heart failure and moderate to severe mitral regurg. Per record, patient originally came in with NSTEMI in April 2014 in the setting of A. fib with RVR, cardiac catheterization at that time showed EF 65%, moderate mitral regurg, 99% proximal sport D1 stenosis not amenable to PCI, otherwise mild nonobstructive disease in other coronary arteries. Her last admission was in October for atrial fibrillation with RVR and acute on chronic diastolic heart failure. According to past medical record, patient has been intolerant to amiodarone and not a candidate for tikosyn  due to prolonged QT in the past. During the last admission, it was noted the patient also could not tolerate diastolic 2.5 mg due to severe bradycardia. It appears she was placed on diltiazem drip and spontaneously converted to sinus rhythm afterward. She was discharged on long-acting diltiazem 180 mg and PRN diltiazem on side to control her atrial fibrillation. However she has been having trouble understanding instructions and at this time it is questionable whether or not she has been able to be compliant with the PRN diltiazem on top of her long-acting diltiazem at home.   Patient presented to [facility] on 11/11/2014 with atrial fibrillation with RVR and it 1 day onset of shortness of breath. She denies any obvious orthopnea or paroxysmal nocturnal dyspnea, however she has been having a cough since yesterday. She denies any fever, chill, or lower extremity edema. On arrival her proBNP was elevated at 2400. Of note, her proBNP was 2300 a month ago. K 3.6. EKG showed atrial fibrillation with RVR. She endorsed some chest pressure, however denies any obvious chest pain. Chest x-ray was consistent with interstitial edema. Cardiology was consulted for atrial fibrillation with RVR and acute on chronic diastolic heart failure.  Of note, patient has  been made DO NOT RESUSCITATE status during the last admission.    No current facility-administered medications on file prior to encounter.   Current Outpatient Prescriptions on File Prior to Encounter  Medication Sig Dispense Refill  . aspirin 81 MG chewable tablet Chew 1 tablet (81 mg total) by mouth daily.    Marland Kitchen BESIVANCE 0.6 % SUSP Place 1 drop into both eyes 2 (two) times daily.     Marland Kitchen diltiazem (CARDIZEM CD) 180 MG 24 hr capsule Take 1 capsule (180 mg total) by mouth daily. 30 capsule 5  . diltiazem (CARDIZEM) 30 MG tablet Take 1 tablet (30 mg total) by mouth every 6 (six) hours as needed (rapid heart rate.). 60 tablet 0  . docusate sodium (COLACE) 100 MG capsule Take 100 mg by mouth daily as needed for mild constipation.    Marland Kitchen  fish oil-omega-3 fatty acids 1000 MG capsule Take 1,000 mg by mouth daily.     . furosemide (LASIX) 20 MG tablet Take 20 mg by mouth daily. Take an extra tab daily as needed for fluid (if weight gain >3lbs).    Marland Kitchen HYDROcodone-acetaminophen (NORCO/VICODIN) 5-325 MG per tablet Take 0.5-1 tablets by mouth 2 (two) times daily as needed for moderate pain.    Marland Kitchen levothyroxine (SYNTHROID, LEVOTHROID) 25 MCG tablet Take 1 tablet (25 mcg total) by mouth every morning. 90 tablet 1  . losartan (COZAAR) 50 MG tablet Take 50 mg by mouth daily.     . Multiple Vitamins-Minerals (OCUVITE PRESERVISION) TABS Take 1 tablet by mouth 2 (two) times daily.     Marland Kitchen omeprazole (PRILOSEC) 40 MG capsule Take 40 mg by mouth daily.    Marland Kitchen zolpidem (AMBIEN) 5 MG tablet Take 1 tablet (5 mg total) by mouth at bedtime as needed for sleep. 90 tablet 1      Family History Family History  Problem Relation Age of Onset  . Heart failure Mother     CHF, DM, HBP  . Hypertension Mother   . Uterine cancer Mother   . Stroke Mother   . Colon cancer Neg Hx   . Esophageal cancer Neg Hx   . Rectal cancer Neg Hx   . Stomach cancer Neg Hx      Social History History   Social History  . Marital Status:  Married    Spouse Name: N/A    Number of Children: 74  . Years of Education: N/A   Occupational History  . retired    Social History Main Topics  . Smoking status: Former Smoker -- 0.50 packs/day for 4 years    Types: Cigarettes    Quit date: 07/06/1974  . Smokeless tobacco: Never Used  . Alcohol Use: No  . Drug Use: No  . Sexual Activity: No   Other Topics Concern  . Not on file   Social History Narrative   Married, with 5 children out of the home   Occupation: is on the [employer]: retired since 1974 from [employer] [place]:  No chills, fever, night sweats or weight changes.  Cardiovascular:  No chest pain, dyspnea on exertion, edema, orthopnea, palpitations, paroxysmal nocturnal dyspnea. +chest pressure Dermatological: No rash, lesions/masses Respiratory: +cough, dyspnea Urologic: No hematuria, dysuria Abdominal:   No nausea, vomiting, diarrhea, bright red blood per rectum, melena, or hematemesis Neurologic:  No visual changes, wkns, changes in mental status. All other systems reviewed and are otherwise negative except as noted above.  Physical Exam  Blood pressure 148/86, pulse 129, temperature 97.3 F (36.3 C), temperature source Axillary, resp. rate 27, height 4\' 9"  (1.448 m), weight 124 lb (56.246 kg), SpO2 100 %.  General: Pleasant, NAD Psych: Normal affect. Neuro: Alert and oriented X 3. Moves all extremities spontaneously. HEENT: Normal  Neck: Supple without bruits. Decreased breath sound bilaterally with mild rale and wheezing Lungs:  Resp regular and unlabored, CTA. Heart: tachycardic irregular. no s3, s4, or murmurs. Abdomen: Soft, non-tender, non-distended, BS + x 4.  Extremities: No clubbing, cyanosis or edema. DP/PT/Radials 2+ and equal bilaterally.  Labs   Recent Labs  11/11/14 1028  TROPONINI <0.30   Lab Results  Component Value Date   WBC 6.0 11/11/2014   HGB  9.2* 11/11/2014   HCT 32.2* 11/11/2014   MCV 75.6* 11/11/2014  PLT 280 11/11/2014    Recent Labs Lab 11/11/14 1028  NA 136*  K 3.6*  CL 99  CO2 22  BUN 16  CREATININE 0.85  CALCIUM 8.7  PROT 6.3  BILITOT 0.3  ALKPHOS 79  ALT 19  AST 23  GLUCOSE 148*   Lab Results  Component Value Date   CHOL 160 08/30/2012   HDL 62 08/30/2012   LDLCALC 83 08/30/2012   TRIG 77 08/30/2012   Lab Results  Component Value Date   DDIMER * 06/28/2010    0.70        AT THE INHOUSE ESTABLISHED CUTOFF VALUE OF 0.48 ug/mL FEU, THIS ASSAY HAS BEEN DOCUMENTED IN THE LITERATURE TO HAVE A SENSITIVITY AND NEGATIVE PREDICTIVE VALUE OF AT LEAST 98 TO 99%.  THE TEST RESULT SHOULD BE CORRELATED WITH AN ASSESSMENT OF THE CLINICAL PROBABILITY OF DVT / VTE.    Radiology/Studies  Dg Chest Port 1 View  11/11/2014   CLINICAL DATA:  Respiratory distress and tachycardia today.  EXAM: PORTABLE CHEST - 1 VIEW  COMPARISON:  September 16, 2014  FINDINGS: The mediastinal contour is normal. The heart size is enlarged. There is increased interstitium in bilateral lung. There is no focal pneumonia. There is probably minimal bilateral pleural effusions. The visualized skeletal structures are stable P ower  IMPRESSION: Cardiomegaly with interstitial pulmonary edema.   Electronically Signed   By: Abelardo Diesel M.D.   On: 11/11/2014 11:05    ASSESSMENT AND PLAN  1. Recurent A-fib with RVR  - difficult to control, intolerant to amiodarone and not a candidate for tikosyn due to prolonged QTc  - had significant bradycardia with BB in the past  - on long acting diltiazem 180mg  BID with PRN diltiazem on the side. However question compliance, memory problem make following instruction difficult  - per home med, unclear if she's taking 180mg  long acting diltiazem once a day or twice a day  - continue on IV diltiazem for now, not a candidate for systemic anticoagulation  2. Acute on chronic diastolic HF: exacerbated by  a-fib with RBR and moderate to severe MR  - start on IV lasix 40mg    3. Moderate to severe MR 4. HCOM 5. CAD  - Cath 03/2013 EF 65%, moderate mitral regurg, 99% proximal sport D1 stenosis not amenable to PCI, otherwise mild nonobstructive disease in other coronary arteries 6. Hypokalemia 7. HTN 8. HLD 9. Hypothyroidism  Signed, Almyra Deforest, PA-C 11/11/2014, 1:58 PM

## 2014-11-12 DIAGNOSIS — I509 Heart failure, unspecified: Secondary | ICD-10-CM

## 2014-11-12 DIAGNOSIS — I10 Essential (primary) hypertension: Secondary | ICD-10-CM

## 2014-11-12 DIAGNOSIS — I270 Primary pulmonary hypertension: Secondary | ICD-10-CM

## 2014-11-12 DIAGNOSIS — J189 Pneumonia, unspecified organism: Secondary | ICD-10-CM | POA: Insufficient documentation

## 2014-11-12 DIAGNOSIS — J810 Acute pulmonary edema: Secondary | ICD-10-CM | POA: Insufficient documentation

## 2014-11-12 LAB — CBC
HEMATOCRIT: 31 % — AB (ref 36.0–46.0)
Hemoglobin: 8.7 g/dL — ABNORMAL LOW (ref 12.0–15.0)
MCH: 21.4 pg — AB (ref 26.0–34.0)
MCHC: 28.1 g/dL — AB (ref 30.0–36.0)
MCV: 76.4 fL — ABNORMAL LOW (ref 78.0–100.0)
Platelets: 288 10*3/uL (ref 150–400)
RBC: 4.06 MIL/uL (ref 3.87–5.11)
RDW: 17.7 % — ABNORMAL HIGH (ref 11.5–15.5)
WBC: 7.1 10*3/uL (ref 4.0–10.5)

## 2014-11-12 LAB — BASIC METABOLIC PANEL
ANION GAP: 13 (ref 5–15)
BUN: 18 mg/dL (ref 6–23)
CALCIUM: 8.4 mg/dL (ref 8.4–10.5)
CO2: 23 meq/L (ref 19–32)
CREATININE: 0.9 mg/dL (ref 0.50–1.10)
Chloride: 100 mEq/L (ref 96–112)
GFR calc Af Amer: 63 mL/min — ABNORMAL LOW (ref >90)
GFR calc non Af Amer: 55 mL/min — ABNORMAL LOW (ref >90)
Glucose, Bld: 119 mg/dL — ABNORMAL HIGH (ref 70–99)
Potassium: 4.4 mEq/L (ref 3.7–5.3)
Sodium: 136 mEq/L — ABNORMAL LOW (ref 137–147)

## 2014-11-12 LAB — TROPONIN I: Troponin I: <0.3 ng/mL (ref <0.30)

## 2014-11-12 MED ORDER — SCOPOLAMINE 1 MG/3DAYS TD PT72
1.0000 | MEDICATED_PATCH | TRANSDERMAL | Status: DC
  Administered 2014-11-12: 1.5 mg via TRANSDERMAL
  Filled 2014-11-12: qty 1

## 2014-11-12 MED ORDER — CEFTRIAXONE SODIUM IN DEXTROSE 20 MG/ML IV SOLN
1.0000 g | INTRAVENOUS | Status: DC
  Administered 2014-11-12 – 2014-11-13 (×2): 1 g via INTRAVENOUS
  Filled 2014-11-12 (×2): qty 50

## 2014-11-12 MED ORDER — FUROSEMIDE 10 MG/ML IJ SOLN
40.0000 mg | Freq: Two times a day (BID) | INTRAMUSCULAR | Status: DC
  Administered 2014-11-12 – 2014-11-14 (×4): 40 mg via INTRAVENOUS
  Filled 2014-11-12 (×7): qty 4

## 2014-11-12 MED ORDER — LORAZEPAM 2 MG/ML IJ SOLN
0.5000 mg | Freq: Once | INTRAMUSCULAR | Status: AC
  Administered 2014-11-12: 0.5 mg via INTRAVENOUS
  Filled 2014-11-12: qty 1

## 2014-11-12 MED ORDER — IPRATROPIUM-ALBUTEROL 0.5-2.5 (3) MG/3ML IN SOLN
3.0000 mL | Freq: Four times a day (QID) | RESPIRATORY_TRACT | Status: DC
  Administered 2014-11-12 (×2): 3 mL via RESPIRATORY_TRACT
  Filled 2014-11-12 (×3): qty 3

## 2014-11-12 MED ORDER — IPRATROPIUM-ALBUTEROL 0.5-2.5 (3) MG/3ML IN SOLN: 3.0000 mL | RESPIRATORY_TRACT | Status: DC | PRN

## 2014-11-12 MED ORDER — MORPHINE SULFATE 2 MG/ML IJ SOLN
2.0000 mg | Freq: Once | INTRAMUSCULAR | Status: AC
  Administered 2014-11-12: 2 mg via INTRAVENOUS

## 2014-11-12 MED ORDER — FUROSEMIDE 10 MG/ML IJ SOLN
80.0000 mg | Freq: Once | INTRAMUSCULAR | Status: AC
  Administered 2014-11-12: 80 mg via INTRAVENOUS

## 2014-11-12 MED ORDER — MORPHINE SULFATE 25 MG/ML IV SOLN
1.0000 mg/h | INTRAVENOUS | Status: DC | PRN
  Filled 2014-11-12: qty 10

## 2014-11-12 MED ORDER — DEXTROSE 5 % IV SOLN
500.0000 mg | INTRAVENOUS | Status: DC
  Administered 2014-11-12 – 2014-11-13 (×2): 500 mg via INTRAVENOUS
  Filled 2014-11-12 (×2): qty 500

## 2014-11-12 MED ORDER — METHYLPREDNISOLONE SODIUM SUCC 125 MG IJ SOLR
60.0000 mg | INTRAMUSCULAR | Status: DC
  Administered 2014-11-12: 60 mg via INTRAVENOUS
  Filled 2014-11-12: qty 2
  Filled 2014-11-12: qty 0.96

## 2014-11-12 MED ORDER — MORPHINE SULFATE 2 MG/ML IJ SOLN
INTRAMUSCULAR | Status: AC
  Filled 2014-11-12: qty 1

## 2014-11-12 NOTE — Progress Notes (Signed)
[place] TEAM 1 - Stepdown/ICU TEAM Progress Note  Cindy [name] WEX:[id number] DOB: Jan 14, 1922 DOA: 11/11/2014 PCP: Elsie Stain, MD  Admit HPI / Brief Narrative: Cindy [name] is a 80 y.o. WF PMHx hypertrophic cardiomyopathy, CAD not amenable to PCI, Atrial fibrillation, chronic diastolic heart failure, moderate to severe mitral regurgitation, pulmonary hypertension, HTN, HLD, hypothyroidism, lung nodules (does not wish further workup), Presented today with the above noted complaint. Patient claims that for the past week associated slowly progressing shortness of breath, for the past 2 days shortness of breath has been particularly worse. She also claims that she has had "fluttering" in her chest. EMS was called, patient was found to be in acute respiratory distress and placed on CPAP, subsequently placed on BiPAP in the emergency room. I was subsequently asked to admit this patient for further evaluation and treatment. During my evaluation, patient had been removed off BiPAP, was given 20 mg IV Lasix and was placed on a Cardizem drip. She denies any headache, fever, chest pain, nausea, vomiting, diarrhea or abdominal pain.  HPI/Subjective: 12/8 States (+) SOB, (-) CP, (+) Productive cough (yellow which has changed this Am to Yellow + red), (-) F/C. States not on home O2. Son States has been having Inc Fatigue over last (2) weeks which culminated in Significant SOB and refractory A-Fib   Assessment/Plan: Atrial fibrillation with RVR:  -DC'd Cardizem drip secondary to hypotension.  -Given her advanced age, history of falls suspect not a candidate for long-term anticoagulation.  -Per admission note cardiology was consulted patient did not tolerated amiodarone,Tikosyn and other agents in the past  Acute on chronic diastolic CHF (congestive heart failure)/Hypertrophic cardiomyopathy:  -Cindy [name] (son at bedside), patient went into respiratory distress Lasix 80 mg 1 given patient placed on  BiPAP -Lasix 40 mg  BID started.  -ADDENDUM; during the day patient stabilized able to talk A/O 4, after talking with her family would like to be taken off of BiPAP and not have BiPAP restarted. Understands this may be terminal. -Will DC BiPAP per patient's wishes and if she fails make her comfortable. Per patient and family BiPAP is not to be restarted.  -Strict in and out -Daily a.m. Weight -Scopolamine patch -Morphine drip 1 mg/hr to be started PRN respiratory distress; contact M.D. prior to starting  Essential hypertension:  -Patient has been hypotensive DC Cardizem  -DC losartan    Pulmonary hypertension:  -see acute on chronic diastolic CHF  CAD (coronary artery disease:  -Continue aspirin 81 mg daily,  -Per last cardiology note, not a candidate for PCI. Manage medically.   Acute respiratory failure:  -Most likely multifactorial to include acute on chronic diastolic CHF, CAP -Acute respiratory distress requiring Lasix and BiPAP   -See acute on chronic diastolic CHF  CAP  -Although Pt has no Leucocytosis or Fever may not manifest in 80 yo. - Start Azithromycin + Ceftriaxone -Solumedrol 60 mg Daily - Sputum Culture Pending  -Respiratory Viral Panel Pending - Continue SDuo-Neb -Flutter Valve q 4 hr   Pulmonary Edema - Lasix 80 mg x 1 - Lasix 40 mg BID -Strict In and Out - Daily weight  Insomnia:  -Continue Ambien    Code Status:DNR Family Communication: Cindy [name] (son) and other family members present at time of exam Disposition Plan: Resolution of acute respiratory failure    Consultants: Dr. Daneen Schick (cardiology)   Procedure/Significant Events: 3/3 echocardiogram; Left ventricle: moderate concentric hypertrophy. LVEF= 60% - 65%.  -(grade 2 diastolicdysfunction). - Aortic valve: mild  stenosis.  - Mitral valve: appears to be a mobile density off of the anterior MV leaflet that is only appreciated in the apical views and may represent a vegetation.  Moderate-severe regurgitation  - Left atrium:  mildly dilated. - Right ventricle: mildly dilated. - Tricuspid valve: Moderate regurgitation. - Pulmonary arteries: PA peak pressure: 6mm Hg (S).    Culture 12/7 blood right antecubital 2 NGTD 12/7 MRSA by PCR negative 12/7 sputum pending 12/7 respiratory viral pending   Antibiotics: Ceftriaxone 12/8>> Azithromycin 12/8>>    DVT prophylaxis: Heparin subcutaneous   Devices    LINES / TUBES:      Continuous Infusions: . diltiazem (CARDIZEM) infusion 5 mg/hr (11/11/14 2220)    Objective: VITAL SIGNS: Temp: 97.6 F (36.4 C) (12/08 0744) Temp Source: Oral (12/08 0744) BP: 98/52 mmHg (12/08 0700) Pulse Rate: 70 (12/08 0700) SPO2; FIO2:   Intake/Output Summary (Last 24 hours) at 11/12/14 0953 Last data filed at 11/12/14 0900  Gross per 24 hour  Intake 686.67 ml  Output   2525 ml  Net -1838.33 ml     Exam: General: Initially A/O 4, moderate respiratory distress on 5 L O2 via [place]--> quickly transitioned into acute respiratory distress requiring BiPAP, became unresponsive Lungs: (+) Diffuse Rhonchi Lt > Rt, (+) Expiatory wheeze on Rt.  Cardiovascular: Irregular irregular rate and rhythm, without murmur gallop or rub normal S1 and S2 Abdomen: Nontender, nondistended, soft, bowel sounds positive, no rebound, no ascites, no appreciable mass Extremities: No significant cyanosis, clubbing, or edema bilateral lower extremities  Data Reviewed: Basic Metabolic Panel:  Recent Labs Lab 11/11/14 1028 11/11/14 1730 11/12/14 0332  NA 136*  --  136*  K 3.6*  --  4.4  CL 99  --  100  CO2 22  --  23  GLUCOSE 148*  --  119*  BUN 16  --  18  CREATININE 0.85 0.87 0.90  CALCIUM 8.7  --  8.4   Liver Function Tests:  Recent Labs Lab 11/11/14 1028  AST 23  ALT 19  ALKPHOS 79  BILITOT 0.3  PROT 6.3  ALBUMIN 3.0*   No results for input(s): LIPASE, AMYLASE in the last 168 hours. No results for input(s): AMMONIA  in the last 168 hours. CBC:  Recent Labs Lab 11/11/14 1028 11/11/14 1730 11/12/14 0332  WBC 6.0 7.1 7.1  NEUTROABS 5.0  --   --   HGB 9.2* 9.4* 8.7*  HCT 32.2* 33.1* 31.0*  MCV 75.6* 75.6* 76.4*  PLT 280 279 288   Cardiac Enzymes:  Recent Labs Lab 11/11/14 1028 11/11/14 1730 11/11/14 2146 11/12/14 0332  TROPONINI <0.30 <0.30 <0.30 <0.30   BNP (last 3 results)  Recent Labs  08/19/14 1117 09/16/14 1339 11/11/14 1028  PROBNP 1807.0* 2382.0* 2417.0*   CBG: No results for input(s): GLUCAP in the last 168 hours.  Recent Results (from the past 240 hour(s))  Culture, blood (routine x 2)     Status: None (Preliminary result)   Collection Time: 11/11/14 10:28 AM  Result Value Ref Range Status   Specimen Description BLOOD RIGHT ANTECUBITAL  Final   Special Requests BOTTLES DRAWN AEROBIC AND ANAEROBIC 5CCS  Final   Culture  Setup Time   Final    11/11/2014 18:24 Performed at [facility]    Culture   Final           BLOOD CULTURE RECEIVED NO GROWTH TO DATE CULTURE WILL BE HELD FOR 5 DAYS BEFORE ISSUING A FINAL NEGATIVE REPORT  Performed at [facility]    Report Status PENDING  Incomplete  Culture, blood (routine x 2)     Status: None (Preliminary result)   Collection Time: 11/11/14 10:30 AM  Result Value Ref Range Status   Specimen Description BLOOD RIGHT ANTECUBITAL  Final   Special Requests BOTTLES DRAWN AEROBIC AND ANAEROBIC 5CCS  Final   Culture  Setup Time   Final    11/11/2014 18:24 Performed at [facility]    Culture   Final           BLOOD CULTURE RECEIVED NO GROWTH TO DATE CULTURE WILL BE HELD FOR 5 DAYS BEFORE ISSUING A FINAL NEGATIVE REPORT Performed at [facility]    Report Status PENDING  Incomplete  MRSA PCR Screening     Status: None   Collection Time: 11/11/14  3:42 PM  Result Value Ref Range Status   MRSA by PCR NEGATIVE NEGATIVE Final    Comment:        The GeneXpert MRSA Assay (FDA approved for NASAL  specimens only), is one component of a comprehensive MRSA colonization surveillance program. It is not intended to diagnose MRSA infection nor to guide or monitor treatment for MRSA infections.      Studies:  Recent x-ray studies have been reviewed in detail by the Attending Physician  Scheduled Meds:  Scheduled Meds: . antiseptic oral rinse  7 mL Mouth Rinse BID  . aspirin  81 mg Oral Daily  . docusate sodium  100 mg Oral BID  . furosemide  40 mg Oral Daily  . heparin  5,000 Units Subcutaneous 3 times per day  . levothyroxine  25 mcg Oral QAC breakfast  . losartan  50 mg Oral Daily  . omega-3 acid ethyl esters  1 g Oral BID  . pantoprazole  40 mg Oral Daily  . sodium chloride  3 mL Intravenous Q12H  . sodium chloride  3 mL Intravenous Q12H    Time spent on care of this patient: 40 mins   Allie Bossier , MD   [facility] Office  [phone] Pager - [phone]  On-Call/Text Page:      [email or web address]      password [email or web address]  If 7PM-7AM, please contact night-coverage [email or web address] Password [email or web address] 11/12/2014, 9:53 AM   LOS: 1 day

## 2014-11-12 NOTE — Progress Notes (Signed)
Pharmacy note: ceftriaxone  80 yo female here with SOB and noted with afib, HF  and possible CAP. Pharmacy has been consulted to dose rocephin (patient also noted on azithromycin)  Plan -Rocephin 1gm IV q24h -Will sign off for now. Please contact pharmacy with any other needs  Thank you, Hildred Laser, Pharm D 11/12/2014 10:43 AM

## 2014-11-12 NOTE — Progress Notes (Signed)
Family insisted to have the bipap off, and to get an order for morphine.MD made aware and claimed not to d/c  Anything  And he is coming in an hour to talk to the family. Explained to the family.

## 2014-11-12 NOTE — Progress Notes (Signed)
Lung with coarse crackles all over,   SOB noted,   Hr- 98 , r- 30, stat lasix 80 mg iv and morphine 2 mg given.  Placed on 100 % NRM. foley cath inserted aseptically tol.well.  Less output noted. Continue to  desats to 70's, unresponsive md aware. Placed on bipap sat -96 %. Breathing slow and regular. Continue to monitor

## 2014-11-12 NOTE — Progress Notes (Signed)
Family and patient requesting influenza testing to be done at a later time. Pt just transitioned off bipap per family and pt request. Will continue to monitor.

## 2014-11-12 NOTE — Progress Notes (Signed)
PT Cancellation Note  Patient Details Name: LERONDA LEWERS MRN: [id number] DOB: 08/24/1922   Cancelled Treatment:    Reason Eval/Treat Not Completed: Medical issues which prohibited therapy (Pt having respiratory issues.)   Tangee Marszalek 11/12/2014, 11:01 AM

## 2014-11-12 NOTE — Progress Notes (Signed)
Pt taken off BIPAP at this time with Dr. Sherral Hammers and family at bedside. Pt asking to have mask remove. Pt was placed on NRB and tolerating well at this time and states more comfortable. RT will continue to monitor.

## 2014-11-12 NOTE — Progress Notes (Signed)
Called to room by RN for BIPAP. Dr. Sherral Hammers and son at bedside, pt HR in low 40's and Spo2 36% on NRB with good pleth. Pt was placed on NIV @1150  per Dr. Sherral Hammers and tolerating well. HR came up to 89 and SPo2 83% after being on BIPAP. Pt tolerating well at this time. RT will continue to monitor.

## 2014-11-12 NOTE — Progress Notes (Addendum)
       Patient Name: Cindy [name] Date of Encounter: 11/12/2014    SUBJECTIVE: Elderly female patient with dementia, 80 years of age. We'll consult see because of dyspnea and poor control of heart rate while in atrial fibrillation. As recently as November the patient was in normal sinus rhythm. She is felt to have multiple allergies and include agents such as Lipitor, Irbesartin, Ramipril, amiodarone,telmisartin, hydrochlorothiazide, metoprolol, Tikosyn, and Norpace. Patient is also known to have hypertrophic cardiomyopathy.  TELEMETRY:  Atrial fibrillation with rapid ventricular response Filed Vitals:   11/12/14 1153 11/12/14 1154 11/12/14 1159 11/12/14 1229  BP:      Pulse: 84 89 87   Temp: 97.6 F (36.4 C)     TempSrc: Axillary     Resp: 25 29 26    Height:      Weight:      SpO2: 75% 83% 96% 98%    Intake/Output Summary (Last 24 hours) at 11/12/14 1319 Last data filed at 11/12/14 1154  Gross per 24 hour  Intake 701.67 ml  Output   2525 ml  Net -1823.33 ml   LABS: Basic Metabolic Panel:  Recent Labs  11/11/14 1028 11/11/14 1730 11/12/14 0332  NA 136*  --  136*  K 3.6*  --  4.4  CL 99  --  100  CO2 22  --  23  GLUCOSE 148*  --  119*  BUN 16  --  18  CREATININE 0.85 0.87 0.90  CALCIUM 8.7  --  8.4   CBC:  Recent Labs  11/11/14 1028 11/11/14 1730 11/12/14 0332  WBC 6.0 7.1 7.1  NEUTROABS 5.0  --   --   HGB 9.2* 9.4* 8.7*  HCT 32.2* 33.1* 31.0*  MCV 75.6* 75.6* 76.4*  PLT 280 279 288   Cardiac Enzymes:  Recent Labs  11/11/14 1730 11/11/14 2146 11/12/14 0332  TROPONINI <0.30 <0.30 <0.30   BNP    Component Value Date/Time   PROBNP 2417.0* 11/11/2014 1028   Radiology/Studies:  The chest x-ray of 11/11/2014 demonstrates cardiomegaly and interstitial pulmonary edema  Physical Exam: Blood pressure 139/63, pulse 87, temperature 97.6 F (36.4 C), temperature source Axillary, resp. rate 26, height 4\' 9"  (1.448 m), weight 133 lb 13.1 oz (60.7  kg), SpO2 98 %. Weight change:   Wt Readings from Last 3 Encounters:  11/12/14 133 lb 13.1 oz (60.7 kg)  10/07/14 133 lb 11.2 oz (60.646 kg)  09/18/14 133 lb 3.2 oz (60.419 kg)   elderly and frail Decreased breath sounds at both bases with rales Cardiac exam reveals a rapid irregular rhythm No peripheral edema is noted  ASSESSMENT:  1. Acute on chronic diastolic heart failure precipitated bile also of atrial kick and tachycardia 2. Atrial fibrillation with rapid ventricular response 3. Hypertrophic cardiomyopathy 4. Multiple "allergies" 5. Coronary artery disease 6. Essential hypertension  Plan:  1. Rate control, although this will be difficult given list of medication intolerances. It would be nice to control rhythm with anti-arrhythmic, but intolerant of most. 2. Diuresis as tolerated by blood pressure 3. Poor prognosis, with very few long term rhythm treatment options remaining.   Cindy [name] 11/12/2014, 1:19 PM

## 2014-11-12 NOTE — Progress Notes (Signed)
HR now 75 bpm and appears to be sinus.

## 2014-11-13 ENCOUNTER — Inpatient Hospital Stay (HOSPITAL_COMMUNITY): Payer: Medicare Other

## 2014-11-13 LAB — TSH: TSH: 1.2 u[IU]/mL (ref 0.350–4.500)

## 2014-11-13 MED ORDER — CEFTRIAXONE SODIUM IN DEXTROSE 20 MG/ML IV SOLN
1.0000 g | INTRAVENOUS | Status: DC
  Administered 2014-11-13 – 2014-11-17 (×5): 1 g via INTRAVENOUS
  Filled 2014-11-13 (×6): qty 50

## 2014-11-13 MED ORDER — AMIODARONE HCL 200 MG PO TABS
200.0000 mg | ORAL_TABLET | Freq: Two times a day (BID) | ORAL | Status: DC
  Administered 2014-11-13 – 2014-11-18 (×11): 200 mg via ORAL
  Filled 2014-11-13 (×12): qty 1

## 2014-11-13 NOTE — Evaluation (Signed)
Physical Therapy Evaluation Patient Details Name: Cindy [name] MRN: [id number] DOB: 1922/10/05 Today's Date: 11/13/2014   History of Present Illness  80 year old admitted with paroxysmal atrial fibrillation, demand ischemia, underlying coronary artery disease-treated medically with shortness of breath.  Clinical Impression  Pt admitted with generalized weakness and dizziness related to above illness. Pt currently with functional limitations due to the deficits listed below (see PT Problem List). Pt sat EOB with min A and stood but became too dizzy to continue with session. Pt will benefit from skilled PT to increase their independence and safety with mobility to allow discharge to the venue listed below. PT will continue to follow.      Follow Up Recommendations SNF;Supervision/Assistance - 24 hour    Equipment Recommendations  None recommended by PT    Recommendations for Other Services       Precautions / Restrictions Precautions Precautions: Fall Restrictions Weight Bearing Restrictions: No      Mobility  Bed Mobility Overal bed mobility: Needs Assistance Bed Mobility: Supine to Sit;Sit to Supine     Supine to sit: Min assist Sit to supine: Min assist   General bed mobility comments: had difficulty scooting out of middle of bed, min A given to bring hips fwd, min A to LE's for back to bed  Transfers Overall transfer level: Needs assistance Equipment used: 2 person hand held assist Transfers: Sit to/from Stand Sit to Stand: Min assist;+2 safety/equipment         General transfer comment: 2 person HHA, min A for sit to stand, performed 3x, pt became very dizzy, maintained standing once for 1 minute for bowel cleanup but cold not pivot safely to chair due to dizziness. Pt also slightly nauseous, RN notified Pt left in modified chair position for increased tolerance of upright.  Ambulation/Gait             General Gait Details: not able today  Stairs             Wheelchair Mobility    Modified Rankin (Stroke Patients Only)       Balance Overall balance assessment: Needs assistance Sitting-balance support: Feet supported;No upper extremity supported Sitting balance-Leahy Scale: Good Sitting balance - Comments: good balance to begin, declined as pt became dizzy and min A given for safety   Standing balance support: Bilateral upper extremity supported;During functional activity Standing balance-Leahy Scale: Poor Standing balance comment: unable to stand independently today due to weakness and dizziness                             Pertinent Vitals/Pain Pain Assessment: No/denies pain  BP while dizzy: 124/70, HR 88 bpm, O2 sats 90% on RA, O2 reapplied after session    Home Living Family/patient expects to be discharged to:: Skilled nursing facility Living Arrangements: Alone               Additional Comments: children checked on her    Prior Function Level of Independence: Independent         Comments: Pt reports an aide coming in a few hours each morning to assist with ADLs and some IADLs as needed. Pt ambulates (I) for household, uses w/c for long distances and occasionally uses rollator when feeling stiff. No falls in the last year. Family checks on her a few days/week.     Hand Dominance   Dominant Hand: Right    Extremity/Trunk Assessment   Upper  Extremity Assessment: Generalized weakness           Lower Extremity Assessment: Generalized weakness;RLE deficits/detail;Difficult to assess due to impaired cognition RLE Deficits / Details: had some difficulty following instructional commands for MMT, h/o right knee surgery after patellar fx, RLE slightly weaker than left. Overall generalized weakness noted functionally when pt mobilized.    Cervical / Trunk Assessment: Kyphotic  Communication   Communication: HOH  Cognition Arousal/Alertness: Awake/alert Behavior During Therapy: WFL for  tasks assessed/performed Overall Cognitive Status: Impaired/Different from baseline Area of Impairment: Following commands       Following Commands: Follows one step commands with increased time       General Comments: pt cognition WFL at beginning of session but once mobilizing, pt became dizzy and fatigued and with decrease responses and verbalization, mobility terminated at that point for pt to rest    General Comments      Exercises        Assessment/Plan    PT Assessment Patient needs continued PT services  PT Diagnosis Difficulty walking;Generalized weakness   PT Problem List Decreased strength;Decreased activity tolerance;Decreased balance;Decreased mobility;Decreased coordination;Decreased cognition;Cardiopulmonary status limiting activity  PT Treatment Interventions DME instruction;Gait training;Functional mobility training;Therapeutic activities;Therapeutic exercise;Balance training;Patient/family education   PT Goals (Current goals can be found in the Care Plan section) Acute Rehab PT Goals Patient Stated Goal: agreeable to rehab PT Goal Formulation: With patient Time For Goal Achievement: 11/27/14 Potential to Achieve Goals: Fair    Frequency Min 3X/week   Barriers to discharge Decreased caregiver support family requests [facility] [address]    Co-evaluation               End of Session Equipment Utilized During Treatment: Gait belt Activity Tolerance: Patient limited by fatigue;Other (comment) (dizziness) Patient left: in bed;with call bell/phone within reach;with nursing/sitter in room;with family/visitor present Nurse Communication: Mobility status;Other (comment) (dizziness)         Time: 2549-8264 PT Time Calculation (min) (ACUTE ONLY): 27 min   Charges:   PT Evaluation $Initial PT Evaluation Tier I: 1 Procedure PT Treatments $Therapeutic Activity: 23-37 mins   PT G Codes:        Leighton Roach, PT  Acute Rehab Services   [place], [place] 11/13/2014, 1:34 PM

## 2014-11-13 NOTE — Progress Notes (Signed)
Pt admitted to unit with family at bedside. Pt voices no complaints at this time. O2 sats 100% on 4l/[place]. Dropped down to 3l/[place]

## 2014-11-13 NOTE — Progress Notes (Signed)
[facility] TEAM 1 - Stepdown/ICU TEAM Progress Note  Cindy [name]:[id number] DOB: [birth year] DOA: 11/11/2014 PCP: Elsie Stain, MD  Admit HPI / Brief Narrative: 80 y.o. F w/ a Hx of hypertrophic cardiomyopathy, CAD not amenable to PCI, Atrial fibrillation, chronic diastolic heart failure, moderate to severe mitral regurgitation, pulmonary hypertension, HTN, HLD, hypothyroidism, and lung nodules (does not wish further workup) who presented with SOB slowly progressing over a week.  She also had "fluttering" in her chest. EMS was called, patient was found to be in acute respiratory distress and placed on CPAP > BiPAP in the emergency room.   HPI/Subjective: Resting comfortably at the time of my exam.  Denies cp, sob, n/v, or abdom pain.    Assessment/Plan:  Acute hypoxic respiratory failure -multifactorial to include acute on chronic diastolic CHF + afib w/ RVR in setting of chronic Pulm HTN  Atrial fibrillation with RVR -DC'd Cardizem drip secondary to hypotension -given her advanced age and history of falls not a candidate for long-term anticoagulation -Cards following and directing care of this problem  -patient did not tolerate amiodarone,tikosyn and other agents in the past  Acute on chronic diastolic CHF / Hypertrophic obstructive cardiomyopathy  -Per patient and family BiPAP is not to be restarted -Morphine drip 1 mg/hr to be started PRN respiratory distress  Essential hypertension -BP currently reasonably controlled  Pulmonary hypertension  -see acute on chronic diastolic CHF  CAD   -Continue aspirin 81 mg daily,  -Per Cardiology not a candidate for PCI - manage medically  ?CAP - doubt No elevated WBC - no fever - no infiltrate on CXR - pulmonary sx explained by cardiac issues - stop abx and follow - stop steroids   UTI Cont rocephin for UA c/w UTI - f/u culture data   Insomnia -Continue Ambien  Code Status:  DNR Family Communication: no family present at  time of exam  Disposition Plan: SDU  Consultants: Dr. Daneen Schick (Cardiology)  Procedure/Significant Events: 3/3 echocardiogram; Left ventricle: moderate concentric hypertrophy. EF= 60% - 65%.  -(grade 2 diastolicdysfunction). - Aortic valve: mild stenosis.  - Mitral valve: appears to be a mobile density off of the anterior MV leaflet that is only appreciated in the apical views and may represent a vegetation. Moderate-severe regurgitation  - Left atrium:  mildly dilated. - Right ventricle: mildly dilated. - Tricuspid valve: Moderate regurgitation. - Pulmonary arteries: PA peak pressure: 33mm Hg (S).  Antibiotics: Ceftriaxone 12/8 > Azithromycin 12/8 > 12/9   DVT prophylaxis: Heparin subcutaneous  Objective: Blood pressure 124/70, pulse 82, temperature 97.7 F (36.5 C), temperature source Tympanic, resp. rate 18, height 4\' 9"  (1.448 m), weight 60 kg (132 lb 4.4 oz), SpO2 100 %.  Intake/Output Summary (Last 24 hours) at 11/13/14 1324 Last data filed at 11/13/14 1013  Gross per 24 hour  Intake    763 ml  Output   2575 ml  Net  -1812 ml   Exam: Gen:  Resting comfortably at the time of my exam - no acute resp distress Lungs: mild scattered crackles - no wheeze  Cardiovascular: Irregular irregular - 2/6 holosystolic M Abdomen: Nontender, nondistended, soft, bowel sounds positive, no rebound, no ascites, no appreciable mass Extremities: No significant cyanosis, clubbing, or edema bilateral lower extremities  Data Reviewed: Basic Metabolic Panel:  Recent Labs Lab 11/11/14 1028 11/11/14 1730 11/12/14 0332  NA 136*  --  136*  K 3.6*  --  4.4  CL 99  --  100  CO2 22  --  23  GLUCOSE 148*  --  119*  BUN 16  --  18  CREATININE 0.85 0.87 0.90  CALCIUM 8.7  --  8.4   Liver Function Tests:  Recent Labs Lab 11/11/14 1028  AST 23  ALT 19  ALKPHOS 79  BILITOT 0.3  PROT 6.3  ALBUMIN 3.0*   CBC:  Recent Labs Lab 11/11/14 1028 11/11/14 1730 11/12/14 0332    WBC 6.0 7.1 7.1  NEUTROABS 5.0  --   --   HGB 9.2* 9.4* 8.7*  HCT 32.2* 33.1* 31.0*  MCV 75.6* 75.6* 76.4*  PLT 280 279 288   Cardiac Enzymes:  Recent Labs Lab 11/11/14 1028 11/11/14 1730 11/11/14 2146 11/12/14 0332  TROPONINI <0.30 <0.30 <0.30 <0.30   BNP (last 3 results)  Recent Labs  08/19/14 1117 09/16/14 1339 11/11/14 1028  PROBNP 1807.0* 2382.0* 2417.0*    Recent Results (from the past 240 hour(s))  Culture, blood (routine x 2)     Status: None (Preliminary result)   Collection Time: 11/11/14 10:28 AM  Result Value Ref Range Status   Specimen Description BLOOD RIGHT ANTECUBITAL  Final   Special Requests BOTTLES DRAWN AEROBIC AND ANAEROBIC 5CCS  Final   Culture  Setup Time   Final    11/11/2014 18:24 Performed at [facility]    Culture   Final           BLOOD CULTURE RECEIVED NO GROWTH TO DATE CULTURE WILL BE HELD FOR 5 DAYS BEFORE ISSUING A FINAL NEGATIVE REPORT Performed at [facility]    Report Status PENDING  Incomplete  Culture, blood (routine x 2)     Status: None (Preliminary result)   Collection Time: 11/11/14 10:30 AM  Result Value Ref Range Status   Specimen Description BLOOD RIGHT ANTECUBITAL  Final   Special Requests BOTTLES DRAWN AEROBIC AND ANAEROBIC 5CCS  Final   Culture  Setup Time   Final    11/11/2014 18:24 Performed at [facility]    Culture   Final           BLOOD CULTURE RECEIVED NO GROWTH TO DATE CULTURE WILL BE HELD FOR 5 DAYS BEFORE ISSUING A FINAL NEGATIVE REPORT Performed at [facility]    Report Status PENDING  Incomplete  MRSA PCR Screening     Status: None   Collection Time: 11/11/14  3:42 PM  Result Value Ref Range Status   MRSA by PCR NEGATIVE NEGATIVE Final    Comment:        The GeneXpert MRSA Assay (FDA approved for NASAL specimens only), is one component of a comprehensive MRSA colonization surveillance program. It is not intended to diagnose MRSA infection nor to guide  or monitor treatment for MRSA infections.      Studies:  Recent x-ray studies have been reviewed in detail by the Attending Physician  Scheduled Meds:  Scheduled Meds: . amiodarone  200 mg Oral BID  . antiseptic oral rinse  7 mL Mouth Rinse BID  . aspirin  81 mg Oral Daily  . azithromycin  500 mg Intravenous Q24H  . cefTRIAXone (ROCEPHIN)  IV  1 g Intravenous Q24H  . docusate sodium  100 mg Oral BID  . furosemide  40 mg Intravenous Q12H  . heparin  5,000 Units Subcutaneous 3 times per day  . levothyroxine  25 mcg Oral QAC breakfast  . methylPREDNISolone (SOLU-MEDROL) injection  60 mg Intravenous Q24H  . omega-3 acid ethyl esters  1 g Oral  BID  . pantoprazole  40 mg Oral Daily  . scopolamine  1 patch Transdermal Q72H  . sodium chloride  3 mL Intravenous Q12H  . sodium chloride  3 mL Intravenous Q12H    Time spent on care of this patient: 35 mins  Cherene Altes, MD [facility] For Consults/Admissions - Flow Manager - [phone] Office  [phone] Pager [phone]  On-Call/Text Page:      [email or web address]      password [facility]  11/13/2014, 1:24 PM   LOS: 2 days

## 2014-11-13 NOTE — Progress Notes (Signed)
       Patient Name: Cindy [name] Date of Encounter: 11/13/2014    SUBJECTIVE: The patient appears improved compared to yesterday. She is hard of hearing but does engage in conversation. Her son is present. She has chronic dyspnea but has significant improvement compared with yesterday.  TELEMETRY:  Sinus rhythm noted on monitor with occasional PACs and very brief runs of A. fib. Filed Vitals:   11/13/14 0730 11/13/14 0800 11/13/14 1000 11/13/14 1100  BP: 134/59 113/49 132/43 124/70  Pulse: 88 105 99 82  Temp: 98.7 F (37.1 C)   97.7 F (36.5 C)  TempSrc: Oral   Tympanic  Resp: 16 16 17 18   Height:      Weight:      SpO2: 100% 100% 100% 100%    Intake/Output Summary (Last 24 hours) at 11/13/14 1148 Last data filed at 11/13/14 1013  Gross per 24 hour  Intake    813 ml  Output   2575 ml  Net  -1762 ml   LABS: Basic Metabolic Panel:  Recent Labs  11/11/14 1028 11/11/14 1730 11/12/14 0332  NA 136*  --  136*  K 3.6*  --  4.4  CL 99  --  100  CO2 22  --  23  GLUCOSE 148*  --  119*  BUN 16  --  18  CREATININE 0.85 0.87 0.90  CALCIUM 8.7  --  8.4   CBC:  Recent Labs  11/11/14 1028 11/11/14 1730 11/12/14 0332  WBC 6.0 7.1 7.1  NEUTROABS 5.0  --   --   HGB 9.2* 9.4* 8.7*  HCT 32.2* 33.1* 31.0*  MCV 75.6* 75.6* 76.4*  PLT 280 279 288   Cardiac Enzymes:  Recent Labs  11/11/14 1730 11/11/14 2146 11/12/14 0332  TROPONINI <0.30 <0.30 <0.30    Radiology/Studies:  Chest x-ray performed on 11/13/2014:  IMPRESSION: Congestive heart failure from interstitial edema. No significant interim change from prior exam .  Physical Exam: Blood pressure 124/70, pulse 82, temperature 97.7 F (36.5 C), temperature source Tympanic, resp. rate 18, height 4\' 9"  (1.448 m), weight 132 lb 4.4 oz (60 kg), SpO2 100 %. Weight change: 8 lb 4.4 oz (3.754 kg)  Wt Readings from Last 3 Encounters:  11/13/14 132 lb 4.4 oz (60 kg)  10/07/14 133 lb 11.2 oz (60.646 kg)    09/18/14 133 lb 3.2 oz (60.419 kg)    Frail appearing elderly female Moderate JVD lying at 20 An apical systolic murmurs heard. There is no edema.  ASSESSMENT:  1. Acute on chronic diastolic heart failure due to development of atrial fibrillation with rapid ventricular response 2. Hypertrophic obstructive cardiomyopathy 3. Paroxysmal atrial fibrillation, now status post reversion to normal sinus rhythm 4. Elderly and frail.  Has been living independently until this time. This may no longer be possible.  Plan:  1. Continue diuresis 2. Add amiodarone to control rhythm. It is listed as a intolerance in her allergy section. If we are not able to control her rhythm, she will have repeated episodes of CHF in the setting of hypertrophic cardiomyopathy. I discussed this with his son. We will determine what her intolerances. I will also speak with Dr. [place] (her primary cardiologist) concerning this matter. 3. Could probably transfer to the floor, on 3 E.  [name] 11/13/2014, 11:48 AM

## 2014-11-14 ENCOUNTER — Encounter (HOSPITAL_COMMUNITY): Payer: Self-pay | Admitting: Cardiology

## 2014-11-14 DIAGNOSIS — J9600 Acute respiratory failure, unspecified whether with hypoxia or hypercapnia: Secondary | ICD-10-CM

## 2014-11-14 LAB — CBC
HEMATOCRIT: 35.1 % — AB (ref 36.0–46.0)
HEMOGLOBIN: 10 g/dL — AB (ref 12.0–15.0)
MCH: 22 pg — ABNORMAL LOW (ref 26.0–34.0)
MCHC: 28.5 g/dL — ABNORMAL LOW (ref 30.0–36.0)
MCV: 77.3 fL — ABNORMAL LOW (ref 78.0–100.0)
Platelets: 280 10*3/uL (ref 150–400)
RBC: 4.54 MIL/uL (ref 3.87–5.11)
RDW: 17.7 % — ABNORMAL HIGH (ref 11.5–15.5)
WBC: 10.9 10*3/uL — ABNORMAL HIGH (ref 4.0–10.5)

## 2014-11-14 LAB — COMPREHENSIVE METABOLIC PANEL
ALBUMIN: 2.7 g/dL — AB (ref 3.5–5.2)
ALK PHOS: 68 U/L (ref 39–117)
ALT: 42 U/L — ABNORMAL HIGH (ref 0–35)
ANION GAP: 16 — AB (ref 5–15)
AST: 30 U/L (ref 0–37)
BUN: 26 mg/dL — ABNORMAL HIGH (ref 6–23)
CO2: 26 mEq/L (ref 19–32)
CREATININE: 0.92 mg/dL (ref 0.50–1.10)
Calcium: 8.2 mg/dL — ABNORMAL LOW (ref 8.4–10.5)
Chloride: 93 mEq/L — ABNORMAL LOW (ref 96–112)
GFR calc Af Amer: 62 mL/min — ABNORMAL LOW (ref >90)
GFR calc non Af Amer: 53 mL/min — ABNORMAL LOW (ref >90)
Glucose, Bld: 106 mg/dL — ABNORMAL HIGH (ref 70–99)
POTASSIUM: 3.7 meq/L (ref 3.7–5.3)
Sodium: 135 mEq/L — ABNORMAL LOW (ref 137–147)
Total Bilirubin: 0.3 mg/dL (ref 0.3–1.2)
Total Protein: 5.7 g/dL — ABNORMAL LOW (ref 6.0–8.3)

## 2014-11-14 LAB — CLOSTRIDIUM DIFFICILE BY PCR: Toxigenic C. Difficile by PCR: NEGATIVE

## 2014-11-14 MED ORDER — PANTOPRAZOLE SODIUM 40 MG PO TBEC
40.0000 mg | DELAYED_RELEASE_TABLET | Freq: Two times a day (BID) | ORAL | Status: DC
  Administered 2014-11-14 – 2014-11-18 (×6): 40 mg via ORAL
  Filled 2014-11-14 (×6): qty 1

## 2014-11-14 MED ORDER — PANTOPRAZOLE SODIUM 40 MG PO TBEC: 40.0000 mg | DELAYED_RELEASE_TABLET | Freq: Every day | ORAL | Status: DC

## 2014-11-14 MED ORDER — FLUTICASONE PROPIONATE 50 MCG/ACT NA SUSP
1.0000 | Freq: Every day | NASAL | Status: DC
  Administered 2014-11-14 – 2014-11-18 (×5): 1 via NASAL
  Filled 2014-11-14: qty 16

## 2014-11-14 MED ORDER — FUROSEMIDE 10 MG/ML IJ SOLN
40.0000 mg | Freq: Two times a day (BID) | INTRAMUSCULAR | Status: DC
  Administered 2014-11-14 – 2014-11-18 (×8): 40 mg via INTRAVENOUS
  Filled 2014-11-14 (×11): qty 4

## 2014-11-14 NOTE — Progress Notes (Signed)
Cindy [name]:[id number] DOB: 1922/01/03 DOA: 11/11/2014 PCP: Elsie Stain, MD  Brief narrative: 10 ? HOCM, CAD non-interventional candidate, recalcitrant Afib CHad2Vasc2 score~5, mod-severe MV regurg, Pulm Art Htn, Hld, Hypothyroidism, Lung nodules admitted 11/11/14 with Acute hypoxic resp failure 2/2 to multiple casues  Found to be in Afib c RVR and admitted to SDU.  Transferred to my care 11/14/14 on Tele floor  Past medical history-As per Problem list Chart reviewed as below-  Consultants: Dr. Daneen Schick (Cardiology)  Procedure/Significant Events: 3/3 echocardiogram; Left ventricle: moderate concentric hypertrophy. EF= 60% - 65%.  -(grade 2 diastolicdysfunction). - Aortic valve: mild stenosis.  - Mitral valve: appears to be a mobile density off of the anterior MV leaflet that is only appreciated in the apical views and may represent a vegetation. Moderate-severe regurgitation  - Left atrium: mildly dilated. - Right ventricle: mildly dilated. - Tricuspid valve: Moderate regurgitation. - Pulmonary arteries: PA peak pressure: 30mm Hg (S).  Antibiotics: Ceftriaxone 12/8 > Azithromycin 12/8 > 12/9   DVT prophylaxis: Heparin subcutaneous   Subjective  Headaches today.   No cp/n/v/sob No blurred or double vision No unilateral weakness   Objective    Interim History:   Telemetry: sinus   Objective: Filed Vitals:   11/13/14 2245 11/14/14 0219 11/14/14 0505 11/14/14 0956  BP: 134/53 122/49 130/44 117/69  Pulse:  79 77 70  Temp:  97.7 F (36.5 C) 97.6 F (36.4 C)   TempSrc:  Oral Oral   Resp:  17 17   Height:      Weight:   57.4 kg (126 lb 8.7 oz)   SpO2:  100% 94%     Intake/Output Summary (Last 24 hours) at 11/14/14 1237 Last data filed at 11/14/14 1212  Gross per 24 hour  Intake   1320 ml  Output   4350 ml  Net  -3030 ml    Exam:  General:  Eomi, ncat Cardiovascular: s1 s 2no m/r/g Respiratory: clear no added sound Abdomen: soft,  nt nd Skin no le edema Neurointact  Data Reviewed: Basic Metabolic Panel:  Recent Labs Lab 11/11/14 1028 11/11/14 1730 11/12/14 0332 11/14/14 0656  NA 136*  --  136* 135*  K 3.6*  --  4.4 3.7  CL 99  --  100 93*  CO2 22  --  23 26  GLUCOSE 148*  --  119* 106*  BUN 16  --  18 26*  CREATININE 0.85 0.87 0.90 0.92  CALCIUM 8.7  --  8.4 8.2*   Liver Function Tests:  Recent Labs Lab 11/11/14 1028 11/14/14 0656  AST 23 30  ALT 19 42*  ALKPHOS 79 68  BILITOT 0.3 0.3  PROT 6.3 5.7*  ALBUMIN 3.0* 2.7*   No results for input(s): LIPASE, AMYLASE in the last 168 hours. No results for input(s): AMMONIA in the last 168 hours. CBC:  Recent Labs Lab 11/11/14 1028 11/11/14 1730 11/12/14 0332 11/14/14 0626  WBC 6.0 7.1 7.1 10.9*  NEUTROABS 5.0  --   --   --   HGB 9.2* 9.4* 8.7* 10.0*  HCT 32.2* 33.1* 31.0* 35.1*  MCV 75.6* 75.6* 76.4* 77.3*  PLT 280 279 288 280   Cardiac Enzymes:  Recent Labs Lab 11/11/14 1028 11/11/14 1730 11/11/14 2146 11/12/14 0332  TROPONINI <0.30 <0.30 <0.30 <0.30   BNP: Invalid input(s): POCBNP CBG: No results for input(s): GLUCAP in the last 168 hours.  Recent Results (from the past 240 hour(s))  Culture, blood (routine x 2)  Status: None (Preliminary result)   Collection Time: 11/11/14 10:28 AM  Result Value Ref Range Status   Specimen Description BLOOD RIGHT ANTECUBITAL  Final   Special Requests BOTTLES DRAWN AEROBIC AND ANAEROBIC 5CCS  Final   Culture  Setup Time   Final    11/11/2014 18:24 Performed at [facility]    Culture   Final           BLOOD CULTURE RECEIVED NO GROWTH TO DATE CULTURE WILL BE HELD FOR 5 DAYS BEFORE ISSUING A FINAL NEGATIVE REPORT Performed at [facility]    Report Status PENDING  Incomplete  Culture, blood (routine x 2)     Status: None (Preliminary result)   Collection Time: 11/11/14 10:30 AM  Result Value Ref Range Status   Specimen Description BLOOD RIGHT ANTECUBITAL  Final    Special Requests BOTTLES DRAWN AEROBIC AND ANAEROBIC 5CCS  Final   Culture  Setup Time   Final    11/11/2014 18:24 Performed at [facility]    Culture   Final           BLOOD CULTURE RECEIVED NO GROWTH TO DATE CULTURE WILL BE HELD FOR 5 DAYS BEFORE ISSUING A FINAL NEGATIVE REPORT Performed at [facility]    Report Status PENDING  Incomplete  MRSA PCR Screening     Status: None   Collection Time: 11/11/14  3:42 PM  Result Value Ref Range Status   MRSA by PCR NEGATIVE NEGATIVE Final    Comment:        The GeneXpert MRSA Assay (FDA approved for NASAL specimens only), is one component of a comprehensive MRSA colonization surveillance program. It is not intended to diagnose MRSA infection nor to guide or monitor treatment for MRSA infections.      Studies:              All Imaging reviewed and is as per above notation   Scheduled Meds: . amiodarone  200 mg Oral BID  . antiseptic oral rinse  7 mL Mouth Rinse BID  . aspirin  81 mg Oral Daily  . cefTRIAXone (ROCEPHIN)  IV  1 g Intravenous Q24H  . docusate sodium  100 mg Oral BID  . furosemide  40 mg Intravenous Q12H  . heparin  5,000 Units Subcutaneous 3 times per day  . levothyroxine  25 mcg Oral QAC breakfast  . omega-3 acid ethyl esters  1 g Oral BID  . pantoprazole  40 mg Oral BID AC  . scopolamine  1 patch Transdermal Q72H   Continuous Infusions: . morphine       Assessment/Plan:  1. Acute multifactorial hypoxic resp failure [chf/afib/pulm htn]-Lasix IV 40 q 12 today-Cardiology  this in am.  bmet in am.  I/o -6.6 liters, Weight at baseline ~ 133.  Currently 126 2. Pulm Art Htn, grade 3-consider Bidil/Thiazides-might benefit from continuous home O2. 3. CAD non-interventional candidate 4. Afib c RVR-Rhythm controlled-Amio 200 bid-consider lowering dose in 1-2 weeks.  Interval LFT/TSH 1 mo. 5. HOCM-#2 + #2 interrelated-medical management-consider OP palliative input as no therapy.   6. Lung  Nodules 7. Pyelonephritis 8. Hypothyroidism-continue Levothyroxine 25 mcg daily  Code Status: Full Family Communication: discussed with niece.  Called son and left message Disposition Plan: Return to SNF in 24-48 hours   Verneita Griffes, MD  [facility] Pager [phone] 11/14/2014, 12:37 PM    LOS: 3 days

## 2014-11-14 NOTE — Progress Notes (Addendum)
Patient Name: Cindy [name] Date of Encounter: 11/14/2014  Principal Problem:   Atrial fibrillation with RVR Active Problems:   Essential hypertension   Hypertrophic obstructive cardiomyopathy   Insomnia   CAD (coronary artery disease)   Acute respiratory failure   Pulmonary hypertension   Acute diastolic CHF (congestive heart failure)   Congestive heart disease   Acute pulmonary edema   CAP (community acquired pneumonia)   Primary Cardiologist: Dr. Martinique  Patient Profile: 80 yo female w/ hx HOCM, NSTEMI 2014 (small vessel dz, med rx), HOH, anemia, hypothyroid, mild AS, mod MR, CHF, admitted 12/07 w/ afib, RVR, D-CHF.   SUBJECTIVE: Feels terrible, nauseated, GI meds not on schedule, takes PPI very early in am daily. Weak, barely able to get to [place], no SOB at rest. No palpitations. Accepts she will not be able to return home. Takes Ambien qhs, has not been getting it here.  OBJECTIVE Filed Vitals:   11/13/14 2245 11/14/14 0219 11/14/14 0505 11/14/14 0956  BP: 134/53 122/49 130/44 117/69  Pulse:  79 77 70  Temp:  97.7 F (36.5 C) 97.6 F (36.4 C)   TempSrc:  Oral Oral   Resp:  17 17   Height:      Weight:   126 lb 8.7 oz (57.4 kg)   SpO2:  100% 94%     Intake/Output Summary (Last 24 hours) at 11/14/14 1031 Last data filed at 11/14/14 1002  Gross per 24 hour  Intake   1320 ml  Output   3400 ml  Net  -2080 ml   Filed Weights   11/13/14 0500 11/13/14 1622 11/14/14 0505  Weight: 132 lb 4.4 oz (60 kg) 128 lb 8.5 oz (58.3 kg) 126 lb 8.7 oz (57.4 kg)    PHYSICAL EXAM General: Well developed, well nourished, female in no acute distress. Head: Normocephalic, atraumatic.  Neck: Supple without bruits, JVD 8-9 cm Lungs:  Resp regular and unlabored, rales bases. Heart: Irreg irreg, S1, S2, no S3, S4, 3/6 murmur; no rub. Abdomen: Soft, non-tender, non-distended, BS + x 4.  Extremities: No clubbing, cyanosis, no edema.  Neuro: Alert and oriented X 3. Moves  all extremities spontaneously. Psych: Normal affect.  LABS: CBC: Recent Labs  11/12/14 0332 11/14/14 0626  WBC 7.1 10.9*  HGB 8.7* 10.0*  HCT 31.0* 35.1*  MCV 76.4* 77.3*  PLT 288 485   Basic Metabolic Panel: Recent Labs  11/12/14 0332 11/14/14 0656  NA 136* 135*  K 4.4 3.7  CL 100 93*  CO2 23 26  GLUCOSE 119* 106*  BUN 18 26*  CREATININE 0.90 0.92  CALCIUM 8.4 8.2*   Liver Function Tests: Recent Labs  11/14/14 0656  AST 30  ALT 42*  ALKPHOS 68  BILITOT 0.3  PROT 5.7*  ALBUMIN 2.7*   Cardiac Enzymes: Recent Labs  11/11/14 1730 11/11/14 2146 11/12/14 0332  TROPONINI <0.30 <0.30 <0.30   BNP: PRO B NATRIURETIC PEPTIDE (BNP)  Date/Time Value Ref Range Status  11/11/2014 10:28 AM 2417.0* 0 - 450 pg/mL Final  09/16/2014 01:39 PM 2382.0* 0 - 450 pg/mL Final   Thyroid Function Tests: Recent Labs  11/13/14 1250  TSH 1.200   TELE:   SR, frequent PACs, sinus arrhythmia  Radiology/Studies: Dg Chest Port 1 View 11/13/2014   CLINICAL DATA:  Pneumonia.  EXAM: PORTABLE CHEST - 1 VIEW  COMPARISON:  11/11/2014.  FINDINGS: Mediastinum and hilar structures are normal. Cardiomegaly with pulmonary venous congestion bilateral pulmonary interstitial prominence with  left-sided pleural effusion. The centric cyst with congestive heart failure. Similar finding noted on prior exam. No pneumothorax. No acute osseus abnormality. Postsurgical changes both shoulders.  IMPRESSION: Congestive heart failure from interstitial edema. No significant interim change from prior exam .   Electronically Signed   By: Marcello Moores  Register   On: 11/13/2014 07:25   Current Medications:  . amiodarone  200 mg Oral BID  . antiseptic oral rinse  7 mL Mouth Rinse BID  . aspirin  81 mg Oral Daily  . cefTRIAXone (ROCEPHIN)  IV  1 g Intravenous Q24H  . docusate sodium  100 mg Oral BID  . furosemide  40 mg Intravenous Q12H  . heparin  5,000 Units Subcutaneous 3 times per day  . levothyroxine  25 mcg Oral  QAC breakfast  . omega-3 acid ethyl esters  1 g Oral BID  . pantoprazole  40 mg Oral Daily  . scopolamine  1 patch Transdermal Q72H   . morphine      ASSESSMENT AND PLAN: Principal Problem:   Atrial fibrillation with RVR - spontaneous conversion to SR, on oral amio. Previously had problems with it due to headaches and insomnia. She takes Ambien qhs for insomnia anyway, no HA yet. If does not tolerate amio at BID dosing, consider QD dosing. TSH and LFTs ok.   Active Problems:   Essential hypertension - Improving with diuresis    Hypertrophic obstructive cardiomyopathy - follow echo, last one 02/2014    Insomnia - per IM, consider changing Ambien to qhs    CAD (coronary artery disease) - no ongoing ischemic symptoms    Acute respiratory failure - still diuresing    Pulmonary hypertension - follow, PAS 42 04/1832    Acute diastolic CHF (congestive heart failure) - still with extra volume on board, continue IV Lasix for now.     Acute pulmonary edema - see above    CAP (community acquired pneumonia) - per IM    GERD sx -  will change PPI to early am for first dose and give BID for now. Otherwise, per IM  Signed, Rosaria Ferries , PA-C 10:31 AM 11/14/2014   Agree with note by Rosaria Ferries PA-C  Pt admitted with CHF, AFIB with RVR, HCM. Converted to NSR. On Amio. Good diuresis. Agree not an oral AC candidate. Lungs clear. Clinically improved . Can transition to PO diuretics tomorrow. PT/OT. Probably home over the weekend.  Lorretta Harp, M.D., [place], [facility], [place] [address] [place], [place]  [address]  [phone] 11/14/2014 1:15 PM

## 2014-11-14 NOTE — Evaluation (Signed)
Occupational Therapy Evaluation Patient Details Name: Cindy [name] MRN: [id number] DOB: [birth year] Today's Date: 11/14/2014    History of Present Illness 80 year old admitted with paroxysmal atrial fibrillation, demand ischemia, underlying coronary artery disease-treated medically with shortness of breath.   Clinical Impression   Pt presents as per above and demonstrates deficits w/ ADL's ,self care and functional mobility due to generalized weakness and c/o dizziness impacting her level of independence. She lives alone and will benefit from acute OT to address deficits as outlined in problem section below. Recommend SNF Rehab when d/c from acute.    Follow Up Recommendations  SNF;Supervision/Assistance - 24 hour    Equipment Recommendations  Other (comment) (TBA)    Recommendations for Other Services       Precautions / Restrictions Precautions Precautions: Fall Restrictions Weight Bearing Restrictions: No      Mobility Bed Mobility Overal bed mobility: Needs Assistance Bed Mobility: Supine to Sit;Sit to Supine     Supine to sit: Min assist     General bed mobility comments: Has difficulty scooting out of middle of bed, min A given to bring hips fwd.  Transfers Overall transfer level: Needs assistance Equipment used: 1 person hand held assist Transfers: Sit to/from [facility] Sit to Stand: Min assist Stand pivot transfers: Mod assist       General transfer comment: Pt was Min-mod A for SPT, she was noted to grab onto therapists forearms in stading to steady herself and may benefit from use of RW, PT to cont to assess. Overall increased time for transfers and following commands as well as vc's for safety and sequencing during transfers.    Balance Overall balance assessment: Needs assistance Sitting-balance support: Feet supported;No upper extremity supported Sitting balance-Leahy Scale: Good Sitting balance - Comments: C/o feeling dizzy in  sitting, however this "got better" as she sat EOB for ~63min then was agreeable to transfer to chair.   Standing balance support: Bilateral upper extremity supported;During functional activity Standing balance-Leahy Scale: Fair Standing balance comment: VC's and Min-mod A during transfer to chair noted due to weakness and initial c/o dizziness.                            ADL Overall ADL's : Needs assistance/impaired Eating/Feeding: Set up   Grooming: Wash/dry hands;Wash/dry face;Oral care;Sitting   Upper Body Bathing: Set up;Sitting;Supervision/ safety   Lower Body Bathing: Sit to/from stand;Moderate assistance   Upper Body Dressing : Set up;Min guard;Sitting   Lower Body Dressing: Sit to/from stand;Maximal assistance   Toilet Transfer: Moderate assistance;Stand-pivot;BSC   Toileting- Clothing Manipulation and Hygiene: Maximal assistance;Sit to/from stand       Functional mobility during ADLs: Moderate assistance;Cueing for safety;Cueing for sequencing (Moderate assistance sit to stand and transfer from EOB to chair w/ both hands on therapists forearms for support ) General ADL Comments: Pt and her son were educated in role of OT and she also participated in ADL retraining session with focus on grooming and functional activity in sitting/activity tolerance after transferring to recliner. Pt requires increased time for tasks and is slow to respond at times. Pt/son agree on recommendation of SNF Rehab as she lives alone.     Vision  Wears glasses, reading and uses magnifier                   Perception     Praxis      Pertinent Vitals/Pain Pain Assessment:  No/denies pain     Hand Dominance Right   Extremity/Trunk Assessment Upper Extremity Assessment Upper Extremity Assessment: Generalized weakness   Lower Extremity Assessment Lower Extremity Assessment: Defer to PT evaluation   Cervical / Trunk Assessment Cervical / Trunk Assessment: Kyphotic    Communication Communication Communication: HOH   Cognition Arousal/Alertness: Awake/alert Behavior During Therapy: WFL for tasks assessed/performed Overall Cognitive Status:  (Slow to respond to questions/commands but able to complete with increased time) Area of Impairment: Following commands       Following Commands: Follows one step commands with increased time           General Comments       Exercises       Shoulder Instructions      Home Living Family/patient expects to be discharged to:: Skilled nursing facility Living Arrangements: Alone                               Additional Comments: children check on her, assist w/ meals from time to time.      Prior Functioning/Environment Level of Independence: Independent        Comments: Pt reports an aide coming in a few hours each morning to assist with ADLs and some IADLs as needed. Pt ambulates (I) for household, uses w/c for long distances and occasionally uses rollator when feeling stiff. No falls in the last year. Family checks on her a few days/week.    OT Diagnosis: Generalized weakness (?cognitive deficits, cont to assess.)   OT Problem List: Decreased strength;Decreased activity tolerance;Impaired balance (sitting and/or standing);Impaired vision/perception;Decreased knowledge of use of DME or AE;Decreased cognition;Cardiopulmonary status limiting activity   OT Treatment/Interventions: Self-care/ADL training;Energy conservation;DME and/or AE instruction;Patient/family education;Therapeutic activities;Balance training    OT Goals(Current goals can be found in the care plan section) Acute Rehab OT Goals Patient Stated Goal: Pt/family are agreeable to SNF Rehab Time For Goal Achievement: 11/28/14 Potential to Achieve Goals: Fair  OT Frequency: Min 2X/week   Barriers to D/C: Other (comment) (Lives alone and family checks on her; caregiver in mornings)          Co-evaluation               End of Session Equipment Utilized During Treatment: Gait belt Nurse Communication: Mobility status;Other (comment) (Pt sitting up in chair and had completed grooming )  Activity Tolerance: Patient tolerated treatment well;Other (comment) (Limited by dizziness) Patient left: in chair;with call bell/phone within reach   Time: 1048-1120 OT Time Calculation (min): 32 min Charges:  OT General Charges $OT Visit: 1 Procedure OT Evaluation $Initial OT Evaluation Tier I: 1 Procedure OT Treatments $Self Care/Home Management : 8-22 mins $Therapeutic Activity: 8-22 mins     Almyra Deforest, OT 11/14/2014, 12:47 PM

## 2014-11-14 NOTE — Progress Notes (Signed)
Patient had 3 loose bowel movements since 0700 today.  C-diff ordered per protocol.  Patient and family educated on isolation.  Will continue to monitor.

## 2014-11-15 ENCOUNTER — Ambulatory Visit: Payer: Medicare Other | Admitting: Cardiology

## 2014-11-15 ENCOUNTER — Other Ambulatory Visit: Payer: Self-pay

## 2014-11-15 LAB — BASIC METABOLIC PANEL
Anion gap: 14 (ref 5–15)
BUN: 19 mg/dL (ref 6–23)
CO2: 30 mEq/L (ref 19–32)
Calcium: 8.4 mg/dL (ref 8.4–10.5)
Chloride: 90 mEq/L — ABNORMAL LOW (ref 96–112)
Creatinine, Ser: 0.94 mg/dL (ref 0.50–1.10)
GFR calc non Af Amer: 52 mL/min — ABNORMAL LOW (ref >90)
GFR, EST AFRICAN AMERICAN: 60 mL/min — AB (ref >90)
Glucose, Bld: 92 mg/dL (ref 70–99)
POTASSIUM: 3.4 meq/L — AB (ref 3.7–5.3)
SODIUM: 134 meq/L — AB (ref 137–147)

## 2014-11-15 MED ORDER — METOPROLOL TARTRATE 12.5 MG HALF TABLET
12.5000 mg | ORAL_TABLET | Freq: Two times a day (BID) | ORAL | Status: DC
  Administered 2014-11-15 – 2014-11-18 (×7): 12.5 mg via ORAL
  Filled 2014-11-15 (×8): qty 1

## 2014-11-15 NOTE — Progress Notes (Signed)
At 1030 R. Barrett, PA showed her EKG that was done this am for pt having SA.  PA reviewed EKG.  No new orders given.  Will continue to monitor.  Karie Kirks, Therapist, sports.

## 2014-11-15 NOTE — Progress Notes (Signed)
Called this am by central monitor that pt the showed SA HR 97.  EKG done showed A. Fib, HR 88.  Recently received from central monitor for HR 159 and noted PT up ambulating pt.  BP 149/77 ,  157.  Pt up to chair w/PT.  Pt states"I was  lighted headed when I got up, now I feel fine."  Dr. Verlon Au in to see pt and made aware.  Instructed to notify cardiologist also.  Will continue to monitor.  Karie Kirks, Therapist, sports.

## 2014-11-15 NOTE — Progress Notes (Signed)
Patient Name: Cindy [name] Date of Encounter: 11/15/2014  Principal Problem:   Atrial fibrillation with RVR Active Problems:   Essential hypertension   Hypertrophic obstructive cardiomyopathy   Insomnia   CAD (coronary artery disease)   Acute respiratory failure   Pulmonary hypertension   Acute diastolic CHF (congestive heart failure)   Acute pulmonary edema   CAP (community acquired pneumonia)   Primary Cardiologist: Dr. Martinique  Patient Profile: 80 yo female w/ hx HOCM, NSTEMI 2014 (small vessel dz, med rx), HOH, anemia, hypothyroid, mild AS, mod MR, CHF, admitted 12/07 w/ afib, RVR, D-CHF.  SUBJECTIVE: Became SOB when tried to ambulate, HR went to > 150. Pt no palpitations, felt weak.  OBJECTIVE Filed Vitals:   11/14/14 2200 11/15/14 0509 11/15/14 0828 11/15/14 1103  BP: 133/83 122/57 134/88 135/65  Pulse: 85 64 87 111  Temp: 97.9 F (36.6 C) 97.5 F (36.4 C)    TempSrc: Oral Oral    Resp: 18 18 18    Height:      Weight:  125 lb 10.6 oz (57 kg)    SpO2: 100% 95% 94%     Intake/Output Summary (Last 24 hours) at 11/15/14 1105 Last data filed at 11/15/14 9449  Gross per 24 hour  Intake   1340 ml  Output   2100 ml  Net   -760 ml   Filed Weights   11/13/14 1622 11/14/14 0505 11/15/14 0509  Weight: 128 lb 8.5 oz (58.3 kg) 126 lb 8.7 oz (57.4 kg) 125 lb 10.6 oz (57 kg)    PHYSICAL EXAM General: Well developed, well nourished, female in no acute distress. Head: Normocephalic, atraumatic.  Neck: Supple without bruits, JVD minimal elevation. Lungs:  Resp regular and unlabored, rales bases. Heart: Rapid and irreg, S1, S2, no S3, S4, 2/6 murmur; no rub. Abdomen: Soft, non-tender, non-distended, BS + x 4.  Extremities: No clubbing, cyanosis, no edema.  Neuro: Alert and oriented X 3. Moves all extremities spontaneously. Psych: Normal affect.  LABS: CBC:  Recent Labs  11/14/14 0626  WBC 10.9*  HGB 10.0*  HCT 35.1*  MCV 77.3*  PLT 675   Basic  Metabolic Panel:  Recent Labs  11/14/14 0656 11/15/14 0500  NA 135* 134*  K 3.7 3.4*  CL 93* 90*  CO2 26 30  GLUCOSE 106* 92  BUN 26* 19  CREATININE 0.92 0.94  CALCIUM 8.2* 8.4   Liver Function Tests:  Recent Labs  11/14/14 0656  AST 30  ALT 42*  ALKPHOS 68  BILITOT 0.3  PROT 5.7*  ALBUMIN 2.7*   BNP: PRO B NATRIURETIC PEPTIDE (BNP)  Date/Time Value Ref Range Status  11/11/2014 10:28 AM 2417.0* 0 - 450 pg/mL Final  09/16/2014 01:39 PM 2382.0* 0 - 450 pg/mL Final   Thyroid Function Tests:  Recent Labs  11/13/14 1250  TSH 1.200   TELE: SR, ST, PR interval varies. PACs seen, ?short runs VT     ECG: 11/15/2014 Initially read as afib, but is SR  Current Medications:  . amiodarone  200 mg Oral BID  . antiseptic oral rinse  7 mL Mouth Rinse BID  . aspirin  81 mg Oral Daily  . cefTRIAXone (ROCEPHIN)  IV  1 g Intravenous Q24H  . docusate sodium  100 mg Oral BID  . fluticasone  1 spray Each Nare Daily  . furosemide  40 mg Intravenous Q12H  . heparin  5,000 Units Subcutaneous 3 times per day  . levothyroxine  25  mcg Oral QAC breakfast  . metoprolol tartrate  12.5 mg Oral BID  . omega-3 acid ethyl esters  1 g Oral BID  . pantoprazole  40 mg Oral BID AC      ASSESSMENT AND PLAN: Principal Problem:  Atrial fibrillation with RVR - spontaneous conversion to SR, on oral amio. Previously had problems with it due to headaches and insomnia. Per her son, don't tell her what it is or that she previously had a reaction to it and she will be fine. TSH and LFTs ok. Started on Lopressor 12.5 bid by IM, was not on BB PTA, was on Cardizem CD 180 mg, IV Cardizem d/c'd after admit due to hypotension. Watch BP, has had no bradycardia, may be able to increase dose.  Active Problems:  Essential hypertension - Improving with diuresis   Hypertrophic obstructive cardiomyopathy - follow echo, last one 02/2014   Insomnia - per IM, consider changing Ambien to qhs   CAD (coronary  artery disease) - no ongoing ischemic symptoms   Acute respiratory failure - still diuresing   Pulmonary hypertension - follow, PAS 42 84/1660   Acute diastolic CHF (congestive heart failure) - still with extra volume on board, continue IV Lasix for now.    Acute pulmonary edema - see above, required BiPAP on admission   CAP (community acquired pneumonia) - per IM   GERD sx - will change PPI to early am for first dose and give BID for now. Otherwise, per IM  Signed, Rosaria Ferries , PA-C 11:05 AM 11/15/2014  Agree with note by Rosaria Ferries PA-C  Pt continues to diurese (7Liters). She still has bilateral rales.Needs continues diuresis.  HR in the low 100s on Amio Not an Anticoag candidate. . Low dose BB started. Getting PT/OT. Agree with SNF after D/C. Needs Case Manager to start the process.   Lorretta Harp, M.D., [place], [facility], [place] [address] [place], [place]  [address]  [phone] 11/15/2014 11:57 AM

## 2014-11-15 NOTE — Clinical Social Work Placement (Addendum)
     Clinical Social Work Department CLINICAL SOCIAL WORK PLACEMENT NOTE 11/18/2014  Patient:  Cappella,Ashyah C  Account Number:  [id number] Admit date:  11/11/2014  Clinical Social Worker:  Butch Penny Alliene Klugh, LCSW  Date/time:  11/13/2014 03:44 PM  Clinical Social Work is seeking post-discharge placement for this patient at the following level of care:   SKILLED NURSING   (*CSW will update this form in Epic as items are completed)   11/14/2014  Patient/family provided with [place] Department of Clinical Social Works list of facilities offering this level of care within the geographic area requested by the patient (or if unable, by the patients family).  11/14/2014  Patient/family informed of their freedom to choose among providers that offer the needed level of care, that participate in Medicare, Medicaid or managed care program needed by the patient, have an available bed and are willing to accept the patient.  11/14/2014  Patient/family informed of [facility] ownership interest in [facility], as well as of the fact that they are under no obligation to receive care at this facility.  PASARR submitted to EDS on  PASARR number received on   FL2 transmitted to all facilities in geographic area requested by pt/family on  11/15/2014 FL2 transmitted to all facilities within larger geographic area on   Patient informed that his/her managed care company has contracts with or will negotiate with  certain facilities, including the following:   NA  *Has existing PASARR     Patient/family informed of bed offers received:  11/18/2014 Patient chooses bed at [facility] Physician recommends and patient chooses bed at    Patient to be transferred to [place] on  11/18/2014 Patient to be transferred to facility by [facility]) Patient and family notified of transfer on 11/18/2014 Name of family member notified:  Ronalee Belts and [place]  The following physician  request were entered in Epic: Physician Request  Please sign FL2.  Please prepare priority discharge summary and prescriptions.    Additional Comments: 11/18/14  OK per MD for d/c today to SNF with Palliative care follow up.  Patient is agreeable to d/c to SNF.  She states that she has been there before and is familiar with the facility. Both of her sons are fully supportive of SNF placement and request palliative care services with progression to Hospice if needed.  Nursing notified to call report. CSW signing off.

## 2014-11-15 NOTE — Progress Notes (Signed)
Cindy [name] TJQ:[id number] DOB: 05-26-1922 DOA: 11/11/2014 PCP: Elsie Stain, MD  Brief narrative: 38 ? HOCM, CAD non-interventional candidate, recalcitrant Afib CHad2Vasc2 score~5, mod-severe MV regurg, Pulm Art Htn, Hld, Hypothyroidism, Lung nodules admitted 11/11/14 with Acute hypoxic resp failure 2/2 to multiple casues  Found to be in Afib c RVR and admitted to SDU.  Transferred to my care 11/14/14 on Tele floor  Past medical history-As per Problem list Chart reviewed as below-  Consultants: Dr. Daneen Schick (Cardiology)  Procedure/Significant Events: 3/3 echocardiogram; Left ventricle: moderate concentric hypertrophy. EF= 60% - 65%.  -(grade 2 diastolicdysfunction). - Aortic valve: mild stenosis.  - Mitral valve: appears to be a mobile density off of the anterior MV leaflet that is only appreciated in the apical views and may represent a vegetation. Moderate-severe regurgitation  - Left atrium: mildly dilated. - Right ventricle: mildly dilated. - Tricuspid valve: Moderate regurgitation. - Pulmonary arteries: PA peak pressure: 20mm Hg (S).  Antibiotics: Ceftriaxone 12/8 > Azithromycin 12/8 > 12/9   DVT prophylaxis: Heparin subcutaneous   Subjective  Headaches + mild baseline SOB Has been SOB person for a couple of months. Tol diet Flonase didn't help No n/v/cp No fever r chills ++ sputum   Objective    Interim History:   Telemetry: Sinus tachycardia after walking   Objective: Filed Vitals:   11/14/14 1558 11/14/14 2200 11/15/14 0509 11/15/14 0828  BP: 115/69 133/83 122/57 134/88  Pulse: 67 85 64 87  Temp: 97.9 F (36.6 C) 97.9 F (36.6 C) 97.5 F (36.4 C)   TempSrc: Oral Oral Oral   Resp: 18 18 18 18   Height:      Weight:   57 kg (125 lb 10.6 oz)   SpO2: 91% 100% 95% 94%    Intake/Output Summary (Last 24 hours) at 11/15/14 1017 Last data filed at 11/15/14 7622  Gross per 24 hour  Intake   1340 ml  Output   2100 ml  Net   -760 ml     Exam:  General:  Eomi, ncat Cardiovascular: s1 s 2no m/r/g--sinus tach Respiratory: clear no added sound Abdomen: soft, nt nd Skin no le edema Neurointact  Data Reviewed: Basic Metabolic Panel:  Recent Labs Lab 11/11/14 1028 11/11/14 1730 11/12/14 0332 11/14/14 0656 11/15/14 0500  NA 136*  --  136* 135* 134*  K 3.6*  --  4.4 3.7 3.4*  CL 99  --  100 93* 90*  CO2 22  --  23 26 30   GLUCOSE 148*  --  119* 106* 92  BUN 16  --  18 26* 19  CREATININE 0.85 0.87 0.90 0.92 0.94  CALCIUM 8.7  --  8.4 8.2* 8.4   Liver Function Tests:  Recent Labs Lab 11/11/14 1028 11/14/14 0656  AST 23 30  ALT 19 42*  ALKPHOS 79 68  BILITOT 0.3 0.3  PROT 6.3 5.7*  ALBUMIN 3.0* 2.7*   No results for input(s): LIPASE, AMYLASE in the last 168 hours. No results for input(s): AMMONIA in the last 168 hours. CBC:  Recent Labs Lab 11/11/14 1028 11/11/14 1730 11/12/14 0332 11/14/14 0626  WBC 6.0 7.1 7.1 10.9*  NEUTROABS 5.0  --   --   --   HGB 9.2* 9.4* 8.7* 10.0*  HCT 32.2* 33.1* 31.0* 35.1*  MCV 75.6* 75.6* 76.4* 77.3*  PLT 280 279 288 280   Cardiac Enzymes:  Recent Labs Lab 11/11/14 1028 11/11/14 1730 11/11/14 2146 11/12/14 0332  TROPONINI <0.30 <0.30 <0.30 <0.30  BNP: Invalid input(s): POCBNP CBG: No results for input(s): GLUCAP in the last 168 hours.  Recent Results (from the past 240 hour(s))  Culture, blood (routine x 2)     Status: None (Preliminary result)   Collection Time: 11/11/14 10:28 AM  Result Value Ref Range Status   Specimen Description BLOOD RIGHT ANTECUBITAL  Final   Special Requests BOTTLES DRAWN AEROBIC AND ANAEROBIC 5CCS  Final   Culture  Setup Time   Final    11/11/2014 18:24 Performed at [facility]    Culture   Final           BLOOD CULTURE RECEIVED NO GROWTH TO DATE CULTURE WILL BE HELD FOR 5 DAYS BEFORE ISSUING A FINAL NEGATIVE REPORT Performed at [facility]    Report Status PENDING  Incomplete  Culture, blood  (routine x 2)     Status: None (Preliminary result)   Collection Time: 11/11/14 10:30 AM  Result Value Ref Range Status   Specimen Description BLOOD RIGHT ANTECUBITAL  Final   Special Requests BOTTLES DRAWN AEROBIC AND ANAEROBIC 5CCS  Final   Culture  Setup Time   Final    11/11/2014 18:24 Performed at [facility]    Culture   Final           BLOOD CULTURE RECEIVED NO GROWTH TO DATE CULTURE WILL BE HELD FOR 5 DAYS BEFORE ISSUING A FINAL NEGATIVE REPORT Performed at [facility]    Report Status PENDING  Incomplete  MRSA PCR Screening     Status: None   Collection Time: 11/11/14  3:42 PM  Result Value Ref Range Status   MRSA by PCR NEGATIVE NEGATIVE Final    Comment:        The GeneXpert MRSA Assay (FDA approved for NASAL specimens only), is one component of a comprehensive MRSA colonization surveillance program. It is not intended to diagnose MRSA infection nor to guide or monitor treatment for MRSA infections.   Clostridium Difficile by PCR     Status: None   Collection Time: 11/14/14  3:51 PM  Result Value Ref Range Status   C difficile by pcr NEGATIVE NEGATIVE Final     Studies:              All Imaging reviewed and is as per above notation   Scheduled Meds: . amiodarone  200 mg Oral BID  . antiseptic oral rinse  7 mL Mouth Rinse BID  . aspirin  81 mg Oral Daily  . cefTRIAXone (ROCEPHIN)  IV  1 g Intravenous Q24H  . docusate sodium  100 mg Oral BID  . fluticasone  1 spray Each Nare Daily  . furosemide  40 mg Intravenous Q12H  . heparin  5,000 Units Subcutaneous 3 times per day  . levothyroxine  25 mcg Oral QAC breakfast  . omega-3 acid ethyl esters  1 g Oral BID  . pantoprazole  40 mg Oral BID AC   Continuous Infusions: . morphine       Assessment/Plan:  1. Acute multifactorial hypoxic resp failure [chf/afib/pulm htn]-Lasix IV 40 q 12 today-Cardiology  this in am.  bmet in am.  I/o -6.6 liters, Weight at baseline ~ 133.  Currently  125.6 2. Cough-Get CXR 2 view as has some sputum-afebrile however so no ABx currently 3. diarrhea 4. Pulm Art Htn, grade 3-consider Bidil/Thiazides-might benefit from continuous home O2. 5. CAD non-interventional candidate 6. Afib c RVR-Rhythm controlled-Amio 200 bid-consider lowering dose in 1-2 weeks.  Interval LFT/TSH 1 mo.  Added Metoprolol 12.5 mg bid 12/11 am as flipping into and out of RVR with activity-might need Cardizem Gtt if no better or Diltiazem 30 q 6 po 7. HOCM-#2 + #2 interrelated-medical management-consider OP palliative input as no known medical therpies for this-likely precipitant for Afib given Cardiac remodelling. 8. Lung Nodules 9. Severe malnutrition-Poor Po intake yesterday.  Weight loss not soley from Diuresis.  Poor overall prognosis if cannot control medical issues 10. Pyelonephritis-no cultures?? D/c Ceftriaxone 12/11 as complete 3days empiric Rx 11. Hypothyroidism-continue Levothyroxine 25 mcg daily  Code Status: Full Family Communication: discussed c son bedisde Disposition Plan: Return to SNF in 24-48 hours   Verneita Griffes, MD  [facility] Pager [phone] 11/15/2014, 10:17 AM    LOS: 4 days

## 2014-11-15 NOTE — Progress Notes (Signed)
Informed by pharmacist that pt with order for metoprolol and has allergy to it.  Informed Dr. Verlon Au and instructed to try the 12.5mg  dose.  Jan, Pharmacist made aware and instructed will send dose as ordered.  Will continue to monitor.  Karie Kirks, Therapist, sports.

## 2014-11-15 NOTE — Progress Notes (Signed)
Physical Therapy Treatment Patient Details Name: Cindy [name] MRN: [id number] DOB: 12-Jan-1922 Today's Date: 11/15/2014    History of Present Illness 80 year old admitted with paroxysmal atrial fibrillation, demand ischemia, underlying coronary artery disease-treated medically with shortness of breath.    PT Comments    Patient with limited tolerance to activity due to sinus tach and SOB.  Urinary incontinence with OOB transfers.  Son in room and both he and patient agreeable to need for SNF.  MD recommended vestibular eval after patient already up in chair.  Too fatigued to attempt testing currently.  Will attempt next session or defer to SNF if necessary, but may be limited due to macular degeneration..   Follow Up Recommendations  SNF;Supervision/Assistance - 24 hour     Equipment Recommendations  None recommended by PT    Recommendations for Other Services       Precautions / Restrictions Precautions Precautions: Fall    Mobility  Bed Mobility Overal bed mobility: Needs Assistance       Supine to sit: Supervision     General bed mobility comments: for safety  Transfers Overall transfer level: Needs assistance Equipment used: Rolling walker (2 wheeled)   Sit to Stand: Min assist Stand pivot transfers: Min assist       General transfer comment: assist for safety; pivot to [facility] with walker, patient incontinent of urine in standing; assist for balance/safety  Ambulation/Gait Ambulation/Gait assistance: Min assist Ambulation Distance (Feet): 40 Feet Assistive device: Rolling walker (2 wheeled) Gait Pattern/deviations: Step-through pattern;Shuffle;Decreased stride length     General Gait Details: assist for balance/safety; demonstrates shortness of breath with ambulation to/from door   Stairs            Wheelchair Mobility    Modified Rankin (Stroke Patients Only)       Balance     Sitting balance-Leahy Scale: Fair     Standing balance  support: Bilateral upper extremity supported Standing balance-Leahy Scale: Poor Standing balance comment: needs UE support for standing                    Cognition Arousal/Alertness: Awake/alert Behavior During Therapy: WFL for tasks assessed/performed Overall Cognitive Status: Within Functional Limits for tasks assessed                      Exercises General Exercises - Lower Extremity Ankle Circles/Pumps: AROM;10 reps;Both;Supine Heel Slides: AROM;Both;Supine    General Comments General comments (skin integrity, edema, etc.): RN in room due to HR up to 150's.  MD also entered room and discussed need for pt to have vestibular eval.  Patient with macular degeneration so not sure if will be accurate.      Pertinent Vitals/Pain Pain Assessment: No/denies pain    Home Living                      Prior Function            PT Goals (current goals can now be found in the care plan section) Progress towards PT goals: Progressing toward goals    Frequency  Min 3X/week    PT Plan Current plan remains appropriate    Co-evaluation             End of Session Equipment Utilized During Treatment: Gait belt Activity Tolerance: Patient limited by fatigue;Treatment limited secondary to medical complications (Comment) (HR up to 150's and SOB) Patient left: in chair;with call bell/phone within  reach;with family/visitor present;with nursing/sitter in room     Time: 0940-1020 PT Time Calculation (min) (ACUTE ONLY): 40 min  Charges:  $Gait Training: 8-22 mins $Therapeutic Activity: 23-37 mins                    G Codes:      Neko Boyajian,CYNDI 11/25/2014, 12:16 PM Magda Kiel, [place] 2014/11/25

## 2014-11-15 NOTE — Progress Notes (Signed)
Patient discussed with Dr. Verlon Au- possible stability for d/c over the weekend but he is currently unsure. CSW spoke briefly with Crystal at [facility] but was never able to confirm if they could accept over the weekend.  CSW attempted to reach Crystal X's 4.  Will ask weekend CSW to attempt to reach admissions over the weekend if MD wants to d/c.  CSW spoke to son Ronalee Belts who will be available to go and sign admit paper work at the facility tomorrow if needed.  Lorie Phenix. Pauline Good, [place]

## 2014-11-16 ENCOUNTER — Inpatient Hospital Stay (HOSPITAL_COMMUNITY): Payer: Medicare Other

## 2014-11-16 LAB — CBC WITH DIFFERENTIAL/PLATELET
Basophils Absolute: 0 10*3/uL (ref 0.0–0.1)
Basophils Relative: 0 % (ref 0–1)
EOS PCT: 2 % (ref 0–5)
Eosinophils Absolute: 0.1 10*3/uL (ref 0.0–0.7)
HCT: 32.9 % — ABNORMAL LOW (ref 36.0–46.0)
Hemoglobin: 9.9 g/dL — ABNORMAL LOW (ref 12.0–15.0)
LYMPHS PCT: 20 % (ref 12–46)
Lymphs Abs: 1.5 10*3/uL (ref 0.7–4.0)
MCH: 22.6 pg — ABNORMAL LOW (ref 26.0–34.0)
MCHC: 30.1 g/dL (ref 30.0–36.0)
MCV: 74.9 fL — ABNORMAL LOW (ref 78.0–100.0)
MONO ABS: 0.9 10*3/uL (ref 0.1–1.0)
MONOS PCT: 13 % — AB (ref 3–12)
NEUTROS PCT: 65 % (ref 43–77)
Neutro Abs: 4.8 10*3/uL (ref 1.7–7.7)
PLATELETS: 298 10*3/uL (ref 150–400)
RBC: 4.39 MIL/uL (ref 3.87–5.11)
RDW: 16.8 % — ABNORMAL HIGH (ref 11.5–15.5)
WBC: 7.3 10*3/uL (ref 4.0–10.5)

## 2014-11-16 LAB — COMPREHENSIVE METABOLIC PANEL
ALT: 25 U/L (ref 0–35)
AST: 22 U/L (ref 0–37)
Albumin: 2.7 g/dL — ABNORMAL LOW (ref 3.5–5.2)
Alkaline Phosphatase: 66 U/L (ref 39–117)
Anion gap: 16 — ABNORMAL HIGH (ref 5–15)
BUN: 19 mg/dL (ref 6–23)
CALCIUM: 8.5 mg/dL (ref 8.4–10.5)
CHLORIDE: 92 meq/L — AB (ref 96–112)
CO2: 27 meq/L (ref 19–32)
Creatinine, Ser: 0.99 mg/dL (ref 0.50–1.10)
GFR calc Af Amer: 56 mL/min — ABNORMAL LOW (ref >90)
GFR, EST NON AFRICAN AMERICAN: 49 mL/min — AB (ref >90)
Glucose, Bld: 107 mg/dL — ABNORMAL HIGH (ref 70–99)
Potassium: 3.5 mEq/L — ABNORMAL LOW (ref 3.7–5.3)
SODIUM: 135 meq/L — AB (ref 137–147)
Total Bilirubin: 0.3 mg/dL (ref 0.3–1.2)
Total Protein: 5.8 g/dL — ABNORMAL LOW (ref 6.0–8.3)

## 2014-11-16 NOTE — Progress Notes (Signed)
The patient had dyspnea on exertion overnight and was put on 2L of O2 for comfort during the shift.  She complained of a headache and received Tylenol with relief.  The bed alarm is on and the call bell is within reach.  The patient was re-educated on calling staff for assistance and she verbalized understanding.

## 2014-11-16 NOTE — Progress Notes (Signed)
The RN accompanied the patient to her chest X-ray at 2330 and the patient arrived back to her room at [place].

## 2014-11-16 NOTE — Progress Notes (Signed)
Cindy [name] FMB:[id number] DOB: Apr 19, 1922 DOA: 11/11/2014 PCP: Elsie Stain, MD  Brief narrative: 52 ? HOCM, CAD non-interventional candidate, recalcitrant Afib CHad2Vasc2 score~5, mod-severe MV regurg, Pulm Art Htn, Hld, Hypothyroidism, Lung nodules admitted 11/11/14 with Acute hypoxic resp failure 2/2 to multiple casues  Found to be in Afib c RVR and admitted to SDU.  Transferred to my care 11/14/14 on Tele floor  Past medical history-As per Problem list Chart reviewed as below-  Consultants: Dr. Daneen Schick (Cardiology)  Procedure/Significant Events: 3/3 echocardiogram; Left ventricle: moderate concentric hypertrophy. EF= 60% - 65%.  -(grade 2 diastolicdysfunction). - Aortic valve: mild stenosis.  - Mitral valve: appears to be a mobile density off of the anterior MV leaflet that is only appreciated in the apical views and may represent a vegetation. Moderate-severe regurgitation  - Left atrium: mildly dilated. - Right ventricle: mildly dilated. - Tricuspid valve: Moderate regurgitation. - Pulmonary arteries: PA peak pressure: 70mm Hg (S).  Antibiotics: Ceftriaxone 12/8 > Azithromycin 12/8 > 12/9   DVT prophylaxis: Heparin subcutaneous   Subjective   Alert pleasant-still hs HA as well as stuffy nose   Objective    Interim History:   Telemetry: Sinus tachycardia after walking   Objective: Filed Vitals:   11/15/14 1417 11/15/14 2002 11/15/14 2240 11/16/14 0406  BP: 110/77 148/54 118/56 126/81  Pulse: 131 92 64 51  Temp: 98.5 F (36.9 C) 98.4 F (36.9 C)  97.6 F (36.4 C)  TempSrc: Oral Oral  Oral  Resp: 18 17  18   Height:      Weight:    55.8 kg (123 lb 0.3 oz)  SpO2: 98% 95%  100%    Intake/Output Summary (Last 24 hours) at 11/16/14 1239 Last data filed at 11/16/14 1223  Gross per 24 hour  Intake   1230 ml  Output   2245 ml  Net  -1015 ml    Exam:  General:  Eomi, ncat Cardiovascular: s1 s 2no m/r/g--sinus tach Respiratory: clear  no added sound Abdomen: soft, nt nd Skin no le edema Neurointact  Data Reviewed: Basic Metabolic Panel:  Recent Labs Lab 11/11/14 1028 11/11/14 1730 11/12/14 0332 11/14/14 0656 11/15/14 0500 11/16/14 0325  NA 136*  --  136* 135* 134* 135*  K 3.6*  --  4.4 3.7 3.4* 3.5*  CL 99  --  100 93* 90* 92*  CO2 22  --  23 26 30 27   GLUCOSE 148*  --  119* 106* 92 107*  BUN 16  --  18 26* 19 19  CREATININE 0.85 0.87 0.90 0.92 0.94 0.99  CALCIUM 8.7  --  8.4 8.2* 8.4 8.5   Liver Function Tests:  Recent Labs Lab 11/11/14 1028 11/14/14 0656 11/16/14 0325  AST 23 30 22   ALT 19 42* 25  ALKPHOS 79 68 66  BILITOT 0.3 0.3 0.3  PROT 6.3 5.7* 5.8*  ALBUMIN 3.0* 2.7* 2.7*   No results for input(s): LIPASE, AMYLASE in the last 168 hours. No results for input(s): AMMONIA in the last 168 hours. CBC:  Recent Labs Lab 11/11/14 1028 11/11/14 1730 11/12/14 0332 11/14/14 0626 11/16/14 0325  WBC 6.0 7.1 7.1 10.9* 7.3  NEUTROABS 5.0  --   --   --  4.8  HGB 9.2* 9.4* 8.7* 10.0* 9.9*  HCT 32.2* 33.1* 31.0* 35.1* 32.9*  MCV 75.6* 75.6* 76.4* 77.3* 74.9*  PLT 280 279 288 280 298   Cardiac Enzymes:  Recent Labs Lab 11/11/14 1028 11/11/14 1730 11/11/14 2146 11/12/14  0332  TROPONINI <0.30 <0.30 <0.30 <0.30   BNP: Invalid input(s): POCBNP CBG: No results for input(s): GLUCAP in the last 168 hours.  Recent Results (from the past 240 hour(s))  Culture, blood (routine x 2)     Status: None (Preliminary result)   Collection Time: 11/11/14 10:28 AM  Result Value Ref Range Status   Specimen Description BLOOD RIGHT ANTECUBITAL  Final   Special Requests BOTTLES DRAWN AEROBIC AND ANAEROBIC 5CCS  Final   Culture  Setup Time   Final    11/11/2014 18:24 Performed at [facility]    Culture   Final           BLOOD CULTURE RECEIVED NO GROWTH TO DATE CULTURE WILL BE HELD FOR 5 DAYS BEFORE ISSUING A FINAL NEGATIVE REPORT Performed at [facility]    Report Status PENDING   Incomplete  Culture, blood (routine x 2)     Status: None (Preliminary result)   Collection Time: 11/11/14 10:30 AM  Result Value Ref Range Status   Specimen Description BLOOD RIGHT ANTECUBITAL  Final   Special Requests BOTTLES DRAWN AEROBIC AND ANAEROBIC 5CCS  Final   Culture  Setup Time   Final    11/11/2014 18:24 Performed at [facility]    Culture   Final           BLOOD CULTURE RECEIVED NO GROWTH TO DATE CULTURE WILL BE HELD FOR 5 DAYS BEFORE ISSUING A FINAL NEGATIVE REPORT Performed at [facility]    Report Status PENDING  Incomplete  MRSA PCR Screening     Status: None   Collection Time: 11/11/14  3:42 PM  Result Value Ref Range Status   MRSA by PCR NEGATIVE NEGATIVE Final    Comment:        The GeneXpert MRSA Assay (FDA approved for NASAL specimens only), is one component of a comprehensive MRSA colonization surveillance program. It is not intended to diagnose MRSA infection nor to guide or monitor treatment for MRSA infections.   Clostridium Difficile by PCR     Status: None   Collection Time: 11/14/14  3:51 PM  Result Value Ref Range Status   C difficile by pcr NEGATIVE NEGATIVE Final     Studies:              All Imaging reviewed and is as per above notation   Scheduled Meds: . amiodarone  200 mg Oral BID  . antiseptic oral rinse  7 mL Mouth Rinse BID  . aspirin  81 mg Oral Daily  . cefTRIAXone (ROCEPHIN)  IV  1 g Intravenous Q24H  . docusate sodium  100 mg Oral BID  . fluticasone  1 spray Each Nare Daily  . furosemide  40 mg Intravenous Q12H  . heparin  5,000 Units Subcutaneous 3 times per day  . levothyroxine  25 mcg Oral QAC breakfast  . metoprolol tartrate  12.5 mg Oral BID  . omega-3 acid ethyl esters  1 g Oral BID  . pantoprazole  40 mg Oral BID AC   Continuous Infusions:     Assessment/Plan:  1. Acute multifactorial hypoxic resp failure [chf/afib/pulm htn]-Lasix IV 40 q 12 today-Cardiology  this in am.  bmet in am.  I/o  -7.8 liters, Weight at baseline ~ 133.  Currently 123.  Long discussion c Famuily-they are interested in Hospice and Palliative care and we will consult 2. Cough-Get CXR 2 view as has some sputum-afebrile however so no ABx currently 3.  diarrhea 4. Pulm Art Htn, grade 3-consider Bidil/Thiazides-might benefit from continuous home O2. 5. CAD non-interventional candidate 6. Afib c RVR-Rhythm controlled-Amio 200 bid-consider lowering dose in 1-2 weeks.  Interval LFT/TSH 1 mo.  Added Metoprolol 12.5 mg bid 12/11 am as flipping into and out of RVR with activity--Considering she is bed bound and we are discussing Hopspice, would not aggressively control unless Hr over 120 7. HOCM-#2 + #2 interrelated-medical management-consider OP palliative input as no known medical therpies for this-likely precipitant for Afib given Cardiac remodelling. 8. Lung Nodules 9. Severe malnutrition-Poor Po intake yesterday.  Weight loss not soley from Diuresis.  Poor overall prognosis if cannot control medical issues 10. Pyelonephritis-no cultures?? D/c Ceftriaxone 12/11 as complete 3days empiric Rx 11. Hypothyroidism-continue Levothyroxine 25 mcg daily  Code Status: Full Family Communication: long discussion with family Disposition Plan: ? pending   Verneita Griffes, MD  [facility] Pager [phone] 11/16/2014, 12:39 PM    LOS: 5 days

## 2014-11-16 NOTE — Progress Notes (Signed)
Addendum to Consult note 11/11/2014:  Recurrent a-fib: likely proxysmal  - CHA2DS2-Vasc score 5 (age, female, CAD, HF)  Signed, Almyra Deforest PA Pager: [phone]

## 2014-11-16 NOTE — Progress Notes (Signed)
Patient ID: Cindy [name], female   DOB: [birth year], 80 y.o.   MRN: [id number]    Subjective:    Had some increased SOB yesterday, improved this AM  Objective:   Temp:  [97.6 F (36.4 C)-98.5 F (36.9 C)] 97.6 F (36.4 C) (12/12 0406) Pulse Rate:  [51-157] 51 (12/12 0406) Resp:  [17-18] 18 (12/12 0406) BP: (110-154)/(54-81) 126/81 mmHg (12/12 0406) SpO2:  [95 %-100 %] 100 % (12/12 0406) Weight:  [123 lb 0.3 oz (55.8 kg)] 123 lb 0.3 oz (55.8 kg) (12/12 0406) Last BM Date: 11/14/14  Filed Weights   11/14/14 0505 11/15/14 0509 11/16/14 0406  Weight: 126 lb 8.7 oz (57.4 kg) 125 lb 10.6 oz (57 kg) 123 lb 0.3 oz (55.8 kg)    Intake/Output Summary (Last 24 hours) at 11/16/14 1019 Last data filed at 11/16/14 0909  Gross per 24 hour  Intake   1350 ml  Output   2325 ml  Net   -975 ml    Telemetry: afib variable rates. 60s-120s.  Exam:  General: NAD  Resp: Clear anteriorally  Cardiac: irreg, no m/r/g, no JVD  GI: abdomen soft, NT, ND  MSK: no LE edema  Neuro: no focal deficits   Lab Results:  Basic Metabolic Panel:  Recent Labs Lab 11/14/14 0656 11/15/14 0500 11/16/14 0325  NA 135* 134* 135*  K 3.7 3.4* 3.5*  CL 93* 90* 92*  CO2 26 30 27   GLUCOSE 106* 92 107*  BUN 26* 19 19  CREATININE 0.92 0.94 0.99  CALCIUM 8.2* 8.4 8.5    Liver Function Tests:  Recent Labs Lab 11/11/14 1028 11/14/14 0656 11/16/14 0325  AST 23 30 22   ALT 19 42* 25  ALKPHOS 79 68 66  BILITOT 0.3 0.3 0.3  PROT 6.3 5.7* 5.8*  ALBUMIN 3.0* 2.7* 2.7*    CBC:  Recent Labs Lab 11/12/14 0332 11/14/14 0626 11/16/14 0325  WBC 7.1 10.9* 7.3  HGB 8.7* 10.0* 9.9*  HCT 31.0* 35.1* 32.9*  MCV 76.4* 77.3* 74.9*  PLT 288 280 298    Cardiac Enzymes:  Recent Labs Lab 11/11/14 1730 11/11/14 2146 11/12/14 0332  TROPONINI <0.30 <0.30 <0.30    BNP:  Recent Labs  08/19/14 1117 09/16/14 1339 11/11/14 1028  PROBNP 1807.0* 2382.0* 2417.0*    Coagulation:  Recent  Labs Lab 11/11/14 1028  INR 1.06    ECG:   Medications:   Scheduled Medications: . amiodarone  200 mg Oral BID  . antiseptic oral rinse  7 mL Mouth Rinse BID  . aspirin  81 mg Oral Daily  . cefTRIAXone (ROCEPHIN)  IV  1 g Intravenous Q24H  . docusate sodium  100 mg Oral BID  . fluticasone  1 spray Each Nare Daily  . furosemide  40 mg Intravenous Q12H  . heparin  5,000 Units Subcutaneous 3 times per day  . levothyroxine  25 mcg Oral QAC breakfast  . metoprolol tartrate  12.5 mg Oral BID  . omega-3 acid ethyl esters  1 g Oral BID  . pantoprazole  40 mg Oral BID AC     Infusions:     PRN Medications:  acetaminophen **OR** acetaminophen, guaiFENesin-dextromethorphan, ipratropium-albuterol, ondansetron **OR** ondansetron (ZOFRAN) IV, oxyCODONE, zolpidem     Assessment/Plan    1. Afib with RVR - converted to NSR with oral amio. From notes she reports headaches on amio before but is tolerating currently. Started on lopressor 12.5mg  bid. Some elevated rates yesterday, this AM rates 60s. Given age and history  of intolerance to medications will aim for heart rates <110, as no evidence supports greater benefit with greater control. - continue current meds - from previous notes she has been deemed not to be a long term anticoagulation candidate due to advanced age, frailty and GI bleed  2. HOCM - listed per notes, her eecho describes moderate concentric LVH - echo 02/2014 LVEF 60-65%, moderate LVH, grade II diastolic dysfunction, mild AS - on low dose beta blocker  3. Acute diastolic HF - on lasix IV 40mg  bid, negative 1.4 liters yesterday and 7.4 liters since admission. Cr and BUN remain stable - repeat Xray without significant edema, though she did have some increased SOB yesterday. Would continue IV lasix today, potentially change to oral tomorrow.   4. Hypokalemia - electrolytes per primary team, in setting of arrhythmia please keep K at 4 and Mg at 2   5. Moderate to  severe MR - long term management per primary cardiologist - continue diuretics     Carlyle Dolly, M.D.

## 2014-11-17 LAB — CULTURE, BLOOD (ROUTINE X 2)
CULTURE: NO GROWTH
Culture: NO GROWTH

## 2014-11-17 MED ORDER — NITROGLYCERIN 0.4 MG SL SUBL
SUBLINGUAL_TABLET | SUBLINGUAL | Status: AC
Start: 2014-11-17 — End: 2014-11-17
  Filled 2014-11-17: qty 1

## 2014-11-17 NOTE — Progress Notes (Signed)
Pt called with c/o feeling like she's going to pass out. Pt placed back to bed VSS 142/60 hr 72. O2 sat 94%. Pt appears anxious, is crying. Has verbalized fear of going home to another staff member. Says she is very tired and is ready to go. Son Ronalee Belts made aware. Will visit tomorrow.

## 2014-11-17 NOTE — Progress Notes (Signed)
Cindy [name] QQI:[id number] DOB: 12/20/1921 DOA: 11/11/2014 PCP: Elsie Stain, MD  Brief narrative:  17 ? HOCM, CAD non-interventional candidate, recalcitrant Afib CHad2Vasc2 score~5, mod-severe MV regurg, Pulm Art Htn, Hld, Hypothyroidism, Lung nodules admitted 11/11/14 with Acute hypoxic resp failure 2/2 to multiple casues  Found to be in Afib c RVR and admitted to SDU.  Transferred to my care 11/14/14 on Tele floor  Past medical history-As per Problem list Chart reviewed as below-  Consultants: Dr. Daneen Schick (Cardiology)  Procedure/Significant Events: 3/3 echocardiogram; Left ventricle: moderate concentric hypertrophy. EF= 60% - 65%.  -(grade 2 diastolicdysfunction). - Aortic valve: mild stenosis.  - Mitral valve: appears to be a mobile density off of the anterior MV leaflet that is only appreciated in the apical views and may represent a vegetation. Moderate-severe regurgitation  - Left atrium: mildly dilated. - Right ventricle: mildly dilated. - Tricuspid valve: Moderate regurgitation. - Pulmonary arteries: PA peak pressure: 3mm Hg (S).  Antibiotics: Ceftriaxone 12/8 > Azithromycin 12/8 > 12/9   DVT prophylaxis: Heparin subcutaneous   Subjective   About the same NO acute issues NO n/v/cp sob Poor appetite    Objective    Interim History:   Telemetry: Sinus tachycardia after walking   Objective: Filed Vitals:   11/16/14 2052 11/16/14 2100 11/17/14 0518 11/17/14 1353  BP: 126/80  139/48 103/54  Pulse: 47 65 58 62  Temp: 98 F (36.7 C)  97.7 F (36.5 C) 97.5 F (36.4 C)  TempSrc: Oral  Oral Oral  Resp: 19  18 16   Height:      Weight:   55.2 kg (121 lb 11.1 oz)   SpO2: 100%  99% 99%    Intake/Output Summary (Last 24 hours) at 11/17/14 1645 Last data filed at 11/17/14 1552  Gross per 24 hour  Intake   1440 ml  Output   1750 ml  Net   -310 ml    Exam:  General:  Eomi, ncat Cardiovascular: s1 s 2no m/r/g--sinus  tach Respiratory: clear no added sound Abdomen: soft, nt nd Skin no le edema Neurointact  Data Reviewed: Basic Metabolic Panel:  Recent Labs Lab 11/11/14 1028 11/11/14 1730 11/12/14 0332 11/14/14 0656 11/15/14 0500 11/16/14 0325  NA 136*  --  136* 135* 134* 135*  K 3.6*  --  4.4 3.7 3.4* 3.5*  CL 99  --  100 93* 90* 92*  CO2 22  --  23 26 30 27   GLUCOSE 148*  --  119* 106* 92 107*  BUN 16  --  18 26* 19 19  CREATININE 0.85 0.87 0.90 0.92 0.94 0.99  CALCIUM 8.7  --  8.4 8.2* 8.4 8.5   Liver Function Tests:  Recent Labs Lab 11/11/14 1028 11/14/14 0656 11/16/14 0325  AST 23 30 22   ALT 19 42* 25  ALKPHOS 79 68 66  BILITOT 0.3 0.3 0.3  PROT 6.3 5.7* 5.8*  ALBUMIN 3.0* 2.7* 2.7*   No results for input(s): LIPASE, AMYLASE in the last 168 hours. No results for input(s): AMMONIA in the last 168 hours. CBC:  Recent Labs Lab 11/11/14 1028 11/11/14 1730 11/12/14 0332 11/14/14 0626 11/16/14 0325  WBC 6.0 7.1 7.1 10.9* 7.3  NEUTROABS 5.0  --   --   --  4.8  HGB 9.2* 9.4* 8.7* 10.0* 9.9*  HCT 32.2* 33.1* 31.0* 35.1* 32.9*  MCV 75.6* 75.6* 76.4* 77.3* 74.9*  PLT 280 279 288 280 298   Cardiac Enzymes:  Recent Labs Lab 11/11/14 1028  11/11/14 1730 11/11/14 2146 11/12/14 0332  TROPONINI <0.30 <0.30 <0.30 <0.30   BNP: Invalid input(s): POCBNP CBG: No results for input(s): GLUCAP in the last 168 hours.  Recent Results (from the past 240 hour(s))  Culture, blood (routine x 2)     Status: None   Collection Time: 11/11/14 10:28 AM  Result Value Ref Range Status   Specimen Description BLOOD RIGHT ANTECUBITAL  Final   Special Requests BOTTLES DRAWN AEROBIC AND ANAEROBIC 5CCS  Final   Culture  Setup Time   Final    11/11/2014 18:24 Performed at [facility]   Final    NO GROWTH 5 DAYS Performed at [facility]    Report Status 11/17/2014 FINAL  Final  Culture, blood (routine x 2)     Status: None   Collection Time: 11/11/14 10:30  AM  Result Value Ref Range Status   Specimen Description BLOOD RIGHT ANTECUBITAL  Final   Special Requests BOTTLES DRAWN AEROBIC AND ANAEROBIC 5CCS  Final   Culture  Setup Time   Final    11/11/2014 18:24 Performed at [facility]   Final    NO GROWTH 5 DAYS Performed at [facility]    Report Status 11/17/2014 FINAL  Final  MRSA PCR Screening     Status: None   Collection Time: 11/11/14  3:42 PM  Result Value Ref Range Status   MRSA by PCR NEGATIVE NEGATIVE Final    Comment:        The GeneXpert MRSA Assay (FDA approved for NASAL specimens only), is one component of a comprehensive MRSA colonization surveillance program. It is not intended to diagnose MRSA infection nor to guide or monitor treatment for MRSA infections.   Clostridium Difficile by PCR     Status: None   Collection Time: 11/14/14  3:51 PM  Result Value Ref Range Status   C difficile by pcr NEGATIVE NEGATIVE Final     Studies:              All Imaging reviewed and is as per above notation   Scheduled Meds: . amiodarone  200 mg Oral BID  . antiseptic oral rinse  7 mL Mouth Rinse BID  . aspirin  81 mg Oral Daily  . cefTRIAXone (ROCEPHIN)  IV  1 g Intravenous Q24H  . docusate sodium  100 mg Oral BID  . fluticasone  1 spray Each Nare Daily  . furosemide  40 mg Intravenous Q12H  . heparin  5,000 Units Subcutaneous 3 times per day  . levothyroxine  25 mcg Oral QAC breakfast  . metoprolol tartrate  12.5 mg Oral BID  . nitroGLYCERIN      . pantoprazole  40 mg Oral BID AC   Continuous Infusions:     Assessment/Plan:  1. Acute multifactorial hypoxic resp failure [chf/afib/pulm htn]-Lasix IV 40 q 12 today-Cardiology  this in am.  bmet in am.  I/o -7.8 liters, Weight at baseline ~ 133.  Currently 123.  Long discussion c Family-they are interested in Hospice and Palliative care-electing for Home Hospice. 2. Cough-not 2/2 to PNA.  3. diarrhea 4. Pulm Art Htn, grade 3-consider  Bidil/Thiazides- continuous home O2 on d/c 5. CAD non-interventional candidate 6. Afib c RVR-Rhythm controlled-Amio 200 bid-consider lowering dose in 1-2 weeks.  Interval LFT/TSH 1 mo.  Added Metoprolol 12.5 mg bid 12/11 am as flipping into and out of RVR with activity--Considering she is bed bound and  we are discussing [facility], would not aggressively control unless Hr over 120 7. HOCM-#2 + #2 interrelated-medical management-consider OP palliative input as no known medical therpies for this-likely precipitant for Afib given Cardiac remodelling. 8. Lung Nodules 9. Severe malnutrition-Poor Po intake yesterday.  Weight loss not soley from Diuresis.  Poor overall prognosis if cannot control medical issues 10. Pyelonephritis-no cultures?? D/c Ceftriaxone 12/11 as complete 3days empiric Rx 11. Hypothyroidism-continue Levothyroxine 25 mcg daily  Code Status: Full Family Communication: no family bedside Disposition Plan: Home hospice am 12/14   Verneita Griffes, MD  [facility] Pager [phone] 11/17/2014, 4:45 PM    LOS: 6 days

## 2014-11-17 NOTE — Progress Notes (Signed)
The patient's VS were stable overnight, but she is still having dyspnea on exertion.  She received an Ambien and was able to sleep most of the night.  She did not have any complaints of pain.  Her bed alarm is on and her call bell is within reach.

## 2014-11-17 NOTE — Clinical Social Work Psychosocial (Addendum)
Clinical Social Work Department BRIEF PSYCHOSOCIAL ASSESSMENT 11/14/2014  Patient:  Cindy [name],Cindy [name]     Account Number:  [id number]     Admit date:  11/11/2014  Clinical Social Worker:  Elam Dutch  Date/Time:  11/14/2014 03:05 PM  Referred by:  Physician  Date Referred:  11/13/2014 Referred for  SNF Placement   Other Referral:   Interview type:  Other - See comment Other interview type:   Patient and son Cindy [name]    PSYCHOSOCIAL DATA Living Status:  ALONE Admitted from facility:   Level of care:   Primary support name:  Cindy [name]  [phone] Primary support relationship to patient:  CHILD, ADULT Degree of support available:   Strong support    CURRENT CONCERNS Current Concerns  Post-Acute Placement   Other Concerns:    SOCIAL WORK ASSESSMENT / PLAN 80 year old female admitted from home where she currently lives alone.  CSW met with patient and one of her sons- Cindy [name] to discuss desire for SNF placement.  Patient is alert and oriented; she is, however, extremely hard of hearing. CSW was able to converse with her by speaking clearly and in a fairly loud voice. Patient has been a resident of [facility] (about 5 years ago) and was able tor return home where she has been fairly independent.  Son relates that she is having more and more difficulty at home in managing her care and while the sons are supportive- it is more than they can manage.  CSW discussed SNF placement with patent and son and they agreed to same- wanting [facility]. Son states that he has already contacted [facility] and they are waiting on the hospital paperwork. Fl2 completed and will be sent to facility; placed on chart for MD's signature.   Assessment/plan status:  Psychosocial Support/Ongoing Assessment of Needs Other assessment/ plan:   Information/referral to community resources:   SNF bed list offered but declined and son plan for patient to go to [facility]. He feels very strongly  about this. Does not feel that she can return home.    PATIENT'S/FAMILY'S RESPONSE TO PLAN OF CARE: Patient alert and oriented; she states that she feels sad that she is having so much difficulty in living alone but realizes that she  needs more help. She is familar with [facility] and feels that this is next choice to returning home.  She is invovled in her plan of care and defers to her son in many of her decision making issues. "I'll do what I have to do."  Son appears to be very supportive and invovled; he was noted to talk to his mother and relay to her reasons why SNF is needed.  They apparently have a strong, trusting relationship.  CSW will assist with d/[name] arrangements to SNF when medically stable.

## 2014-11-17 NOTE — Consult Note (Signed)
Asked to see regarding ? Of hospice service availability in SNF vs other hospice options. Met with son Mike and patient this morning.  They have questions about what hospice would potentially look like in each place.  Spoke about how it would be unlikely that hospice could enroll along side SNF/Rehab, but potentially one of hospice agencies could provide palliative care in interim.  Ultimately their preference is to go home with hospice. Family interested in hiring 24h caregivers to assist in home with addition to hospice.  I have placed case manager referral to help family set this up. I suspect only medication hospice would question is omega-3's.  Please call if other questions.  Family had questions answered today, and I gave them my contact info should they have others.  Ultimately, hospice will best be able to answer their more specific questions.    J.  D.O. Palliative Medicine Team at   Pager: [phone] Team Phone: [phone]   

## 2014-11-17 NOTE — Progress Notes (Addendum)
Patient ID: Cindy [name], female   DOB: 1922/05/05, 80 y.o.   MRN: [id number]     Subjective:    SOB improving but not resolved.   Objective:   Temp:  [97.7 F (36.5 C)-98 F (36.7 C)] 97.7 F (36.5 C) (12/13 0518) Pulse Rate:  [47-65] 58 (12/13 0518) Resp:  [18-19] 18 (12/13 0518) BP: (126-139)/(48-80) 139/48 mmHg (12/13 0518) SpO2:  [99 %-100 %] 99 % (12/13 0518) Weight:  [121 lb 11.1 oz (55.2 kg)] 121 lb 11.1 oz (55.2 kg) (12/13 0518) Last BM Date: 11/16/14  Filed Weights   11/15/14 0509 11/16/14 0406 11/17/14 0518  Weight: 125 lb 10.6 oz (57 kg) 123 lb 0.3 oz (55.8 kg) 121 lb 11.1 oz (55.2 kg)    Intake/Output Summary (Last 24 hours) at 11/17/14 6761 Last data filed at 11/17/14 0805  Gross per 24 hour  Intake   1200 ml  Output   2250 ml  Net  -1050 ml    Telemetry: SR, some sinus brady this AM  Exam:  General: NAD  Resp: faint crackles bilateral bases  Cardiac: RRR, 2/6 systolic murmur at apex  GI: abd soft, NT, ND  MSK: no LE edema  Neuro: no focal deficits   Lab Results:  Basic Metabolic Panel:  Recent Labs Lab 11/14/14 0656 11/15/14 0500 11/16/14 0325  NA 135* 134* 135*  K 3.7 3.4* 3.5*  CL 93* 90* 92*  CO2 26 30 27   GLUCOSE 106* 92 107*  BUN 26* 19 19  CREATININE 0.92 0.94 0.99  CALCIUM 8.2* 8.4 8.5    Liver Function Tests:  Recent Labs Lab 11/11/14 1028 11/14/14 0656 11/16/14 0325  AST 23 30 22   ALT 19 42* 25  ALKPHOS 79 68 66  BILITOT 0.3 0.3 0.3  PROT 6.3 5.7* 5.8*  ALBUMIN 3.0* 2.7* 2.7*    CBC:  Recent Labs Lab 11/12/14 0332 11/14/14 0626 11/16/14 0325  WBC 7.1 10.9* 7.3  HGB 8.7* 10.0* 9.9*  HCT 31.0* 35.1* 32.9*  MCV 76.4* 77.3* 74.9*  PLT 288 280 298    Cardiac Enzymes:  Recent Labs Lab 11/11/14 1730 11/11/14 2146 11/12/14 0332  TROPONINI <0.30 <0.30 <0.30    BNP:  Recent Labs  08/19/14 1117 09/16/14 1339 11/11/14 1028  PROBNP 1807.0* 2382.0* 2417.0*    Coagulation:  Recent  Labs Lab 11/11/14 1028  INR 1.06    ECG:   Medications:   Scheduled Medications: . amiodarone  200 mg Oral BID  . antiseptic oral rinse  7 mL Mouth Rinse BID  . aspirin  81 mg Oral Daily  . cefTRIAXone (ROCEPHIN)  IV  1 g Intravenous Q24H  . docusate sodium  100 mg Oral BID  . fluticasone  1 spray Each Nare Daily  . furosemide  40 mg Intravenous Q12H  . heparin  5,000 Units Subcutaneous 3 times per day  . levothyroxine  25 mcg Oral QAC breakfast  . metoprolol tartrate  12.5 mg Oral BID  . nitroGLYCERIN      . omega-3 acid ethyl esters  1 g Oral BID  . pantoprazole  40 mg Oral BID AC     Infusions:     PRN Medications:  acetaminophen **OR** acetaminophen, guaiFENesin-dextromethorphan, ipratropium-albuterol, ondansetron **OR** ondansetron (ZOFRAN) IV, oxyCODONE, zolpidem     Assessment/Plan   1. Afib with RVR - converted to NSR with oral amio. From notes she reports headaches on amio before but is tolerating currently. Started on lopressor 12.5mg  bid. Some elevated rates  yesterday, this AM rates 60s. Given age and history of intolerance to medications will aim for heart rates <110, as no evidence supports greater benefit with greater control. - continue current meds. Some sinus brady overnight, if persists would decrease amio to 200mg  daily - from previous notes she has been deemed not to be a long term anticoagulation candidate due to advanced age, frailty and GI bleed  2. HOCM - listed per notes, her echo describes moderate concentric LVH. No gradient described.  - echo 02/2014 LVEF 60-65%, moderate LVH, grade II diastolic dysfunction, mild AS - on low dose beta blocker  3. Acute on chronic diastolic HF - on lasix IV 40mg  bid, negative 2.1 liters yesterday and 8.8liters since admission. Cr and BUN not back yet but have been stable - repeat Xray without significant edema. Still with some JVD, mild crackles in bases. Still reports some SOB. Would continue IV lasix  today, anticipate changing to oral tomorrow.    4. Moderate to severe MR - long term management per primary cardiologist, she is poor candidate for intervention given advanced aged, fragility, and comorbidities.  - continue diuretics        Carlyle Dolly, M.D.,

## 2014-11-18 MED ORDER — FUROSEMIDE 40 MG PO TABS: 40.0000 mg | ORAL_TABLET | Freq: Two times a day (BID) | ORAL | Status: DC

## 2014-11-18 MED ORDER — GUAIFENESIN-DM 100-10 MG/5ML PO SYRP: 5.0000 mL | ORAL_SOLUTION | ORAL | Status: DC | PRN

## 2014-11-18 MED ORDER — AMIODARONE HCL 200 MG PO TABS: 200.0000 mg | ORAL_TABLET | Freq: Two times a day (BID) | ORAL | Status: AC

## 2014-11-18 MED ORDER — METOPROLOL TARTRATE 12.5 MG HALF TABLET: 12.5000 mg | ORAL_TABLET | Freq: Two times a day (BID) | ORAL | Status: DC

## 2014-11-18 MED ORDER — ACETAMINOPHEN 325 MG PO TABS: 650.0000 mg | ORAL_TABLET | Freq: Four times a day (QID) | ORAL | Status: DC | PRN

## 2014-11-18 MED ORDER — FLUTICASONE PROPIONATE 50 MCG/ACT NA SUSP: 1.0000 | Freq: Every day | NASAL | Status: AC

## 2014-11-18 MED ORDER — IPRATROPIUM-ALBUTEROL 0.5-2.5 (3) MG/3ML IN SOLN: 3.0000 mL | RESPIRATORY_TRACT | Status: DC | PRN

## 2014-11-18 NOTE — Progress Notes (Signed)
Comfort was given to the patient at the beginning of the shift because she appeared distraught.  After spending some time with the patient, she ate some snacks and became more engaged in her surroundings and care.  She received Tylenol for mild, chronic back pain at that time and an Ambien at bedtime.  She is still having dyspnea with exertion and is wearing 2L O2 via N/C.  This morning, she was smiling and active.

## 2014-11-18 NOTE — Discharge Summary (Addendum)
Physician Discharge Summary  Cindy [name] ION:[id number] DOB: 15-Oct-1922 DOA: 11/11/2014  PCP: Elsie Stain, MD  Admit date: 11/11/2014 Discharge date: 11/18/2014  Time spent: 45 minutes  Recommendations for Outpatient Follow-up:  1. [name] Palliatve to follow 2. MOST form 3. Consider labs prn 4. Not changes to Lasix doing/amiodaraone Cardizem etc etc as below  5. Comfort diet  Discharge Diagnoses:  Principal Problem:   Atrial fibrillation with RVR Active Problems:   Essential hypertension   Hypertrophic obstructive cardiomyopathy   Insomnia   CAD (coronary artery disease)   Acute respiratory failure   Pulmonary hypertension   Acute diastolic CHF (congestive heart failure)   Acute pulmonary edema   CAP (community acquired pneumonia)   Discharge Condition: gaurded  Diet recommendation: comfort feed  Filed Weights   11/16/14 0406 11/17/14 0518 11/18/14 0618  Weight: 55.8 kg (123 lb 0.3 oz) 55.2 kg (121 lb 11.1 oz) 56.2 kg (123 lb 14.4 oz)    History of present illness:  89 ? HOCM, CAD non-interventional candidate, recalcitrant Afib CHad2Vasc2 score~5, mod-severe MV regurg, Pulm Art Htn, Hld, Hypothyroidism, Lung nodules admitted 11/11/14 with Acute hypoxic resp failure 2/2 to multiple casues Found to be in Afib c RVR and admitted to SDU. Transferred to my care 11/14/14 on Tele floor. Eventually she was d/c with Home Hospice as per below  Hospital Course:   1. Acute multifactorial hypoxic resp failure [chf/afib/pulm htn]-Lasix IV 40 q 12 today-Cardiology  this in am. bmet in am. I/o -7.7 liters this admission, Weight at baseline ~ 133. Currently 123. Long discussion c Family-they are interested in Hospice and Palliative care-electing for Home Hospice.  As Such-She will need further simplification of medications with discontinuation of meds as per Hospice in the next few weeks 2. Cough-not 2/2 to PNA.  3. diarrhea 4. Pulm Art Htn, grade 3-consider  Bidil/Thiazides- continuous home O2 on d/c 5. CAD non-interventional candidate 6. Afib c RVR-Rhythm controlled-Amio 200 bid-consider lowering dose in 1-2 weeks. Interval LFT/TSH 1 mo. Added    Metoprolol 12.5 mg bid 12/11 am as flipping into and out of RVR with activity--Considering she is bed bound and we are discussing Hopspice, would not aggressively control unless Hr over 120 7. HOCM-#2 + #2 interrelated-medical management-consider OP palliative input as no known medical therpies for this-likely precipitant for Afib given Cardiac remodelling. 8. Lung Nodules 9. Severe malnutrition-Poor Po intake yesterday. Weight loss not soley from Diuresis. Poor overall prognosis if cannot control medical issues 10. Pyelonephritis-no cultures?? D/c Ceftriaxone 12/14 11. Hypothyroidism-continue Levothyroxine 25 mcg daily  Consultants: Dr. Daneen Schick (Cardiology) Hospice  Procedure/Significant Events: 3/3 echocardiogram; Left ventricle: moderate concentric hypertrophy. EF= 60% - 65%.  -(grade 2 diastolicdysfunction). - Aortic valve: mild stenosis.  - Mitral valve: appears to be a mobile density off of the anterior MV leaflet that is only appreciated in the apical views and may represent a vegetation. Moderate-severe regurgitation  - Left atrium: mildly dilated. - Right ventricle: mildly dilated. - Tricuspid valve: Moderate regurgitation. - Pulmonary arteries: PA peak pressure: 50mm Hg (S).  Antibiotics: Ceftriaxone 12/8 >12/14 Azithromycin 12/8 > 12/9   DVT prophylaxis: Heparin subcutaneous  Discharge Exam: Filed Vitals:   11/18/14 0618  BP: 127/33  Pulse: 49  Temp: 97.5 F (36.4 C)  Resp: 16   Alert pleasant oriented in NAD General: tearful yesterday Cardiovascular: s1 s2 tachy-Rates 100's Respiratory: clear no added sound  Discharge Instructions You were cared for by a hospitalist during your hospital stay. If you  have any questions about your discharge medications or  the care you received while you were in the hospital after you are discharged, you can call the unit and asked to speak with the hospitalist on call if the hospitalist that took care of you is not available. Once you are discharged, your primary care physician will handle any further medical issues. Please note that NO REFILLS for any discharge medications will be authorized once you are discharged, as it is imperative that you return to your primary care physician (or establish a relationship with a primary care physician if you do not have one) for your aftercare needs so that they can reassess your need for medications and monitor your lab values.  Discharge Instructions    Diet - low sodium heart healthy    Complete by:  As directed      Discharge instructions    Complete by:  As directed   [place] will follow up with you I woud recommend decreasing frequency of lab draws and simplifying your medications to only necessary things-such as lasix and oxygen for example. Please see you regular MD for further follow up and discussion about this-I will send your reg MD a copy of this Hospital note     Increase activity slowly    Complete by:  As directed           Current Discharge Medication List    START taking these medications   Details  acetaminophen (TYLENOL) 325 MG tablet Take 2 tablets (650 mg total) by mouth every 6 (six) hours as needed for mild pain (or Fever >/= 101).    amiodarone (PACERONE) 200 MG tablet Take 1 tablet (200 mg total) by mouth 2 (two) times daily. Qty: 60 tablet, Refills: 0    fluticasone (FLONASE) 50 MCG/ACT nasal spray Place 1 spray into both nostrils daily. Qty: 1 g, Refills: 2    guaiFENesin-dextromethorphan (ROBITUSSIN DM) 100-10 MG/5ML syrup Take 5 mLs by mouth every 4 (four) hours as needed for cough. Qty: 118 mL, Refills: 0    ipratropium-albuterol (DUONEB) 0.5-2.5 (3) MG/3ML SOLN Take 3 mLs by nebulization every 4 (four) hours as needed. Qty: 360  mL, Refills: 0    metoprolol tartrate (LOPRESSOR) 12.5 mg TABS tablet Take 0.5 tablets (12.5 mg total) by mouth 2 (two) times daily. Qty: 60 each, Refills: 0      CONTINUE these medications which have CHANGED   Details  furosemide (LASIX) 40 MG tablet Take 1 tablet (40 mg total) by mouth 2 (two) times daily. Qty: 60 tablet, Refills: 0      CONTINUE these medications which have NOT CHANGED   Details  aspirin 81 MG chewable tablet Chew 1 tablet (81 mg total) by mouth daily.    BESIVANCE 0.6 % SUSP Place 1 drop into both eyes 2 (two) times daily.     diltiazem (CARDIZEM) 30 MG tablet Take 1 tablet (30 mg total) by mouth every 6 (six) hours as needed (rapid heart rate.). Qty: 60 tablet, Refills: 0    docusate sodium (COLACE) 100 MG capsule Take 100 mg by mouth daily as needed for mild constipation.    HYDROcodone-acetaminophen (NORCO/VICODIN) 5-325 MG per tablet Take 0.5-1 tablets by mouth 2 (two) times daily as needed for moderate pain.    levothyroxine (SYNTHROID, LEVOTHROID) 25 MCG tablet Take 1 tablet (25 mcg total) by mouth every morning. Qty: 90 tablet, Refills: 1    losartan (COZAAR) 50 MG tablet Take 50 mg by mouth  daily.     Multiple Vitamins-Minerals (OCUVITE PRESERVISION) TABS Take 1 tablet by mouth 2 (two) times daily.     omeprazole (PRILOSEC) 40 MG capsule Take 40 mg by mouth daily.    zolpidem (AMBIEN) 5 MG tablet Take 1 tablet (5 mg total) by mouth at bedtime as needed for sleep. Qty: 90 tablet, Refills: 1      STOP taking these medications     diltiazem (CARDIZEM CD) 180 MG 24 hr capsule      fish oil-omega-3 fatty acids 1000 MG capsule        Allergies  Allergen Reactions  . Lipitor [Atorvastatin] Nausea Only and Other (See Comments)    LFT elevation  . Morphine And Related Other (See Comments)    "drives me crazy" and hyperactivity  . Irbesartan Swelling  . Ramipril Swelling    REACTION: lips swelling  . Telmisartan-Hctz Other (See Comments)     REACTION: incontinence  . Amiodarone Other (See Comments)    'terrible headaches'  . Hydrochlorothiazide Other (See Comments)    hyponatremia  . Metoprolol Other (See Comments)    Headache, dizzy, "terribly sick"  . Other Other (See Comments)    ANTICOAGULANTS - not a candidate due to history of falls and bleeding  . Tikosyn [Dofetilide] Other (See Comments)    Not a candidate due to Prolonged QT  . Norpace [Disopyramide] Other (See Comments)    Dry mouth      The results of significant diagnostics from this hospitalization (including imaging, microbiology, ancillary and laboratory) are listed below for reference.    Significant Diagnostic Studies: Dg Chest 2 View  11/16/2014   CLINICAL DATA:  Evaluate for pneumonia.  EXAM: CHEST  2 VIEW  COMPARISON:  11/13/2014 and CT 06/28/2010  FINDINGS: Lungs are adequately inflated with minimal blunting of the left costophrenic angle which may represent minimal atelectasis or small amount of pleural fluid. Evidence of patient's known hiatal hernia in the left retrocardiac region. Stable cardiomegaly. Remainder the exam is unchanged.  IMPRESSION: Minimal blunting of the left costophrenic angle which may be due to small amount of pleural fluid versus atelectasis.  Hiatal hernia.  Mild cardiomegaly.   Electronically Signed   By: Marin Olp M.D.   On: 11/16/2014 01:58   Dg Chest Port 1 View  11/13/2014   CLINICAL DATA:  Pneumonia.  EXAM: PORTABLE CHEST - 1 VIEW  COMPARISON:  11/11/2014.  FINDINGS: Mediastinum and hilar structures are normal. Cardiomegaly with pulmonary venous congestion bilateral pulmonary interstitial prominence with left-sided pleural effusion. The centric cyst with congestive heart failure. Similar finding noted on prior exam. No pneumothorax. No acute osseus abnormality. Postsurgical changes both shoulders.  IMPRESSION: Congestive heart failure from interstitial edema. No significant interim change from prior exam .   Electronically  Signed   By: [place]   On: 11/13/2014 07:25   Dg Chest Port 1 View  11/11/2014   CLINICAL DATA:  Respiratory distress and tachycardia today.  EXAM: PORTABLE CHEST - 1 VIEW  COMPARISON:  September 16, 2014  FINDINGS: The mediastinal contour is normal. The heart size is enlarged. There is increased interstitium in bilateral lung. There is no focal pneumonia. There is probably minimal bilateral pleural effusions. The visualized skeletal structures are stable P ower  IMPRESSION: Cardiomegaly with interstitial pulmonary edema.   Electronically Signed   By: Abelardo Diesel M.D.   On: 11/11/2014 11:05    Microbiology: Recent Results (from the past 240 hour(s))  Culture, blood (routine  x 2)     Status: None   Collection Time: 11/11/14 10:28 AM  Result Value Ref Range Status   Specimen Description BLOOD RIGHT ANTECUBITAL  Final   Special Requests BOTTLES DRAWN AEROBIC AND ANAEROBIC 5CCS  Final   Culture  Setup Time   Final    11/11/2014 18:24 Performed at [facility] [facility]   Final    NO GROWTH 5 DAYS Performed at [facility]    Report Status 11/17/2014 FINAL  Final  Culture, blood (routine x 2)     Status: None   Collection Time: 11/11/14 10:30 AM  Result Value Ref Range Status   Specimen Description BLOOD RIGHT ANTECUBITAL  Final   Special Requests BOTTLES DRAWN AEROBIC AND ANAEROBIC 5CCS  Final   Culture  Setup Time   Final    11/11/2014 18:24 Performed at [place]   Final    NO GROWTH 5 DAYS Performed at [facility]    Report Status 11/17/2014 FINAL  Final  MRSA PCR Screening     Status: None   Collection Time: 11/11/14  3:42 PM  Result Value Ref Range Status   MRSA by PCR NEGATIVE NEGATIVE Final    Comment:        The GeneXpert MRSA Assay (FDA approved for NASAL specimens only), is one component of a comprehensive MRSA colonization surveillance program. It is not intended to diagnose MRSA infection nor to guide  or monitor treatment for MRSA infections.   Clostridium Difficile by PCR     Status: None   Collection Time: 11/14/14  3:51 PM  Result Value Ref Range Status   C difficile by pcr NEGATIVE NEGATIVE Final     Labs: Basic Metabolic Panel:  Recent Labs Lab 11/11/14 1028 11/11/14 1730 11/12/14 0332 11/14/14 0656 11/15/14 0500 11/16/14 0325  NA 136*  --  136* 135* 134* 135*  K 3.6*  --  4.4 3.7 3.4* 3.5*  CL 99  --  100 93* 90* 92*  CO2 22  --  23 26 30 27   GLUCOSE 148*  --  119* 106* 92 107*  BUN 16  --  18 26* 19 19  CREATININE 0.85 0.87 0.90 0.92 0.94 0.99  CALCIUM 8.7  --  8.4 8.2* 8.4 8.5   Liver Function Tests:  Recent Labs Lab 11/11/14 1028 11/14/14 0656 11/16/14 0325  AST 23 30 22   ALT 19 42* 25  ALKPHOS 79 68 66  BILITOT 0.3 0.3 0.3  PROT 6.3 5.7* 5.8*  ALBUMIN 3.0* 2.7* 2.7*   No results for input(s): LIPASE, AMYLASE in the last 168 hours. No results for input(s): AMMONIA in the last 168 hours. CBC:  Recent Labs Lab 11/11/14 1028 11/11/14 1730 11/12/14 0332 11/14/14 0626 11/16/14 0325  WBC 6.0 7.1 7.1 10.9* 7.3  NEUTROABS 5.0  --   --   --  4.8  HGB 9.2* 9.4* 8.7* 10.0* 9.9*  HCT 32.2* 33.1* 31.0* 35.1* 32.9*  MCV 75.6* 75.6* 76.4* 77.3* 74.9*  PLT 280 279 288 280 298   Cardiac Enzymes:  Recent Labs Lab 11/11/14 1028 11/11/14 1730 11/11/14 2146 11/12/14 0332  TROPONINI <0.30 <0.30 <0.30 <0.30   BNP: BNP (last 3 results)  Recent Labs  08/19/14 1117 09/16/14 1339 11/11/14 1028  PROBNP 1807.0* 2382.0* 2417.0*   CBG: No results for input(s): GLUCAP in the last 168 hours.     SignedNita Sells  [facility] 11/18/2014,  8:18 AM

## 2014-11-19 ENCOUNTER — Non-Acute Institutional Stay (SKILLED_NURSING_FACILITY): Payer: Medicare Other | Admitting: Registered Nurse

## 2014-11-19 ENCOUNTER — Other Ambulatory Visit: Payer: Self-pay | Admitting: *Deleted

## 2014-11-19 ENCOUNTER — Encounter: Payer: Self-pay | Admitting: Registered Nurse

## 2014-11-19 DIAGNOSIS — J309 Allergic rhinitis, unspecified: Secondary | ICD-10-CM

## 2014-11-19 DIAGNOSIS — G4700 Insomnia, unspecified: Secondary | ICD-10-CM

## 2014-11-19 DIAGNOSIS — K219 Gastro-esophageal reflux disease without esophagitis: Secondary | ICD-10-CM

## 2014-11-19 DIAGNOSIS — M159 Polyosteoarthritis, unspecified: Secondary | ICD-10-CM

## 2014-11-19 DIAGNOSIS — I5033 Acute on chronic diastolic (congestive) heart failure: Secondary | ICD-10-CM

## 2014-11-19 DIAGNOSIS — E039 Hypothyroidism, unspecified: Secondary | ICD-10-CM

## 2014-11-19 DIAGNOSIS — I10 Essential (primary) hypertension: Secondary | ICD-10-CM

## 2014-11-19 DIAGNOSIS — I4891 Unspecified atrial fibrillation: Secondary | ICD-10-CM

## 2014-11-19 DIAGNOSIS — J9601 Acute respiratory failure with hypoxia: Secondary | ICD-10-CM

## 2014-11-19 DIAGNOSIS — R5381 Other malaise: Secondary | ICD-10-CM

## 2014-11-19 NOTE — Progress Notes (Signed)
Patient ID: Cindy [name], female   DOB: 09-03-1922, 80 y.o.   MRN: [id number]   Place of Service: [facility]  Allergies  Allergen Reactions  . Lipitor [Atorvastatin] Nausea Only and Other (See Comments)    LFT elevation  . Morphine And Related Other (See Comments)    "drives me crazy" and hyperactivity  . Irbesartan Swelling  . Ramipril Swelling    REACTION: lips swelling  . Telmisartan-Hctz Other (See Comments)    REACTION: incontinence  . Amiodarone Other (See Comments)    'terrible headaches'  . Hydrochlorothiazide Other (See Comments)    hyponatremia  . Metoprolol Other (See Comments)    Headache, dizzy, "terribly sick"  . Other Other (See Comments)    ANTICOAGULANTS - not a candidate due to history of falls and bleeding  . Tikosyn [Dofetilide] Other (See Comments)    Not a candidate due to Prolonged QT  . Norpace [Disopyramide] Other (See Comments)    Dry mouth    Code Status: DNR  Goals of Care: Comfort and Quality of Life/STR  Chief Complaint  Patient presents with  . Hospitalization Follow-up    HPI 80 y.o. female with PMH of paroxysmal afib, hypertrophic cardiomyopathy, UI, hypothyroidism, depression, macular degeneration-legally blind in both eyes among others is being seen for a follow-up visit post hospital admission from 11/11/14 to 11/18/14 for acute respiratory failure secondary to multiple causes and afib with RVR. Home hospice was recommended during her hospitalization. Her goal is to return home. Seen in room today. Son, Ronalee Belts is at bedside. No complaints verbalized by the patient.   Review of Systems Constitutional: Negative for fever, chills, and fatigue. HENT: Negative for ear pain, congestion, and sore throat Eyes: Negative for eye pain and eye discharge. Positive for vision impairment-legally blind Cardiovascular: Negative for chest pain, palpitations, and leg swelling Respiratory: Negative cough and wheezing. Positive for shortness of  breath, especially with exertion but no more than usual.  Gastrointestinal: Negative for nausea and vomiting. Negative for abdominal pain, diarrhea and constipation. Positive for reflux Genitourinary: Negative for  dysuria and hematuria Endocrine: Negative for polydipsia, polyphagia, and polyuria Musculoskeletal: Positive for generalized pain from OA Neurological: Negative for dizziness and headache  Skin: Negative for rash and wound.   Psychiatric: Negative for depression  Past Medical History  Diagnosis Date  . Atrial fibrillation 04/23-24/2007    a. recurrent PAF with RVR in September 2013. b. Evaluated 03/2013, previously intolerant to Norpace and Amiodarone - consider Multaq if recurs. c. Not on anticoag due to history of falls and also some internal bleeding per son.  . Hypertrophic cardiomyopathy   . Urinary incontinence   . Diverticulosis of colon (without mention of hemorrhage) 2003/ 08/2000    EGD/colonoscopy Barretts esophagus//H.H divertics 08/2000  . Cervical mass     C2 lateral mass fracture  . Hypertension   . Hypercholesterolemia     219/497  . Hypothyroidism   . Multinodular goiter (nontoxic)   . Osteoporosis   . Blood transfusion     "my blood count was a little low"  . Jaundice ~ 1935    "in grade school"  . Degenerative joint disease     back  . Depression     "husband died 02-05-11"  . Personal history of colonic polyps 02/29/2012    tubular adenoma  . Barrett's esophagus   . CAD (coronary artery disease)     a. NSTEMI 03/2013: secondary to diagonal disease (small, not amenable to PCI,  for med rx).  . Moderate mitral regurgitation 2014  . Mild aortic stenosis 2014  . Lung nodule seen on imaging study, pt does not wish further work up 02/08/2014  . Mitral valve regurgitation, mod to severe with moderately calcified annulus 02/08/2014  . Mitral valve mass, density - no fevers to suggest endocarditis 02/08/2014  . HOH (hard of hearing)   . Shortness of breath   .  CHF (congestive heart failure)   . Heart murmur   . Anemia, iron deficiency   . GERD (gastroesophageal reflux disease)   . Legally blind     "both eyes/Dr. Zigmund Daniel" (11/11/2014)  . Macular degeneration of both eyes     Past Surgical History  Procedure Laterality Date  . Bladder surgery  1990's    bladder tack early 90's  . Tear duct probing  07/29/03    tear duct surg  . Cystourethroscopy  09/17/03  . Rotator cuff repair Bilateral ? date; 09/07/05    left; right( Dr. Gladstone Lighter)  . Appendectomy  1941  . Thyroid ultrasound  10/14/2003    MNG, no dominant masses  . Doppler echocardiography  03/05/2002&09/11/2003    ECHO, EF wnl, mild stenosis, A.S. mild MR, Mild T.R03/31/2003//ECHO EF 70%,LVH, ?diast dysfunction 09/11/2003  . Cataract extraction, bilateral  2003  . Dilation and curettage of uterus  09/07/2000    endometrial polyps removed, path all benign   . Tonsillectomy and adenoidectomy      "as a child"  . Fracture surgery  2010    right knee  . Knee arthroscopy Right   . Reduction mammaplasty  1981  . Incontinence surgery      "she's had a tack and a sling"  . Left heart catheterization with coronary angiogram N/A 03/27/2013    Procedure: LEFT HEART CATHETERIZATION WITH CORONARY ANGIOGRAM;  Surgeon: Peter M Martinique, MD;  Location: [facility] CATH LAB;  Service: Cardiovascular;  Laterality: N/A;    History  Substance Use Topics  . Smoking status: Former Smoker -- 0.50 packs/day for 4 years    Types: Cigarettes    Quit date: 07/06/1974  . Smokeless tobacco: Never Used  . Alcohol Use: No    Family History  Problem Relation Age of Onset  . Heart failure Mother     CHF, DM, HBP  . Hypertension Mother   . Uterine cancer Mother   . Stroke Mother   . Colon cancer Neg Hx   . Esophageal cancer Neg Hx   . Rectal cancer Neg Hx   . Stomach cancer Neg Hx       Medication List       This list is accurate as of: 11/19/14  8:56 PM.  Always use your most recent med list.                 acetaminophen 325 MG tablet  Commonly known as:  TYLENOL  Take 2 tablets (650 mg total) by mouth every 6 (six) hours as needed for mild pain (or Fever >/= 101).     amiodarone 200 MG tablet  Commonly known as:  PACERONE  Take 1 tablet (200 mg total) by mouth 2 (two) times daily.     aspirin 81 MG chewable tablet  Chew 1 tablet (81 mg total) by mouth daily.     BESIVANCE 0.6 % Susp  Generic drug:  Besifloxacin HCl  Place 1 drop into both eyes 2 (two) times daily.     diltiazem 30 MG tablet  Commonly known  as:  CARDIZEM  Take 1 tablet (30 mg total) by mouth every 6 (six) hours as needed (rapid heart rate.).     docusate sodium 100 MG capsule  Commonly known as:  COLACE  Take 100 mg by mouth daily as needed for mild constipation.     fluticasone 50 MCG/ACT nasal spray  Commonly known as:  FLONASE  Place 1 spray into both nostrils daily.     furosemide 40 MG tablet  Commonly known as:  LASIX  Take 1 tablet (40 mg total) by mouth 2 (two) times daily.     guaiFENesin-dextromethorphan 100-10 MG/5ML syrup  Commonly known as:  ROBITUSSIN DM  Take 5 mLs by mouth every 4 (four) hours as needed for cough.     HYDROcodone-acetaminophen 5-325 MG per tablet  Commonly known as:  NORCO/VICODIN  Take 0.5-1 tablets by mouth 2 (two) times daily as needed for moderate pain.     ipratropium-albuterol 0.5-2.5 (3) MG/3ML Soln  Commonly known as:  DUONEB  Take 3 mLs by nebulization every 4 (four) hours as needed.     levothyroxine 25 MCG tablet  Commonly known as:  SYNTHROID, LEVOTHROID  Take 1 tablet (25 mcg total) by mouth every morning.     losartan 50 MG tablet  Commonly known as:  COZAAR  Take 50 mg by mouth daily.     metoprolol tartrate 12.5 mg Tabs tablet  Commonly known as:  LOPRESSOR  Take 0.5 tablets (12.5 mg total) by mouth 2 (two) times daily.     OCUVITE PRESERVISION Tabs  Take 1 tablet by mouth 2 (two) times daily.     omeprazole 40 MG capsule  Commonly  known as:  PRILOSEC  Take 40 mg by mouth daily.     zolpidem 5 MG tablet  Commonly known as:  AMBIEN  Take 1 tablet (5 mg total) by mouth at bedtime as needed for sleep.        Physical Exam  BP 112/77 mmHg  Pulse 74  Temp(Src) 98 F (36.7 C)  Resp 20  Ht 4\' 11"  (1.499 m)  Wt 123 lb 12.8 oz (56.155 kg)  BMI 24.99 kg/m2  SpO2 98%  Constitutional: WDWN elderly female in no acute distress. Conversant and pleasant HEENT: Normocephalic and atraumatic. PERRL. EOM intact. No icterus. Legally blind-use huge magnifier to read. Hard of hearing No nasal discharge or sinus tenderness. Oral mucosa moist. Posterior pharynx clear of any exudate or lesions.  Neck: Supple and nontender. No lymphadenopathy, masses, or thyromegaly. No JVD or carotid bruits. Cardiac: Normal S1, S2. RRR with 2/6 systolic mumur best heard at mitral area. Distal pulses intact. No dependent edema.  Lungs: No respiratory distress. Breath sounds diminished bilaterally with wheezes throughout. Oxygen via [place] in place Abdomen: Audible bowel sounds in all quadrants. Soft, nontender, nondistended. Musculoskeletal: able to move all extremities. Generalized weakness present Skin: Warm and dry. No rash noted. No erythema.  Neurological: Alert and oriented to person, place, and time. No focal deficits.  Psychiatric: Judgment and insight adequate. Appropriate mood and affect.   Labs Reviewed  CBC Latest Ref Rng 11/16/2014 11/14/2014 11/12/2014  WBC 4.0 - 10.5 K/uL 7.3 10.9(H) 7.1  Hemoglobin 12.0 - 15.0 g/dL 9.9(L) 10.0(L) 8.7(L)  Hematocrit 36.0 - 46.0 % 32.9(L) 35.1(L) 31.0(L)  Platelets 150 - 400 K/uL 298 280 288    CMP Latest Ref Rng 11/16/2014 11/15/2014 11/14/2014  Glucose 70 - 99 mg/dL 107(H) 92 106(H)  BUN 6 - 23 mg/dL 19 19 26(H)  Creatinine 0.50 - 1.10 mg/dL 0.99 0.94 0.92  Sodium 137 - 147 mEq/L 135(L) 134(L) 135(L)  Potassium 3.7 - 5.3 mEq/L 3.5(L) 3.4(L) 3.7  Chloride 96 - 112 mEq/L 92(L) 90(L) 93(L)  CO2 19  - 32 mEq/L 27 30 26   Calcium 8.4 - 10.5 mg/dL 8.5 8.4 8.2(L)  Total Protein 6.0 - 8.3 g/dL 5.8(L) - 5.7(L)  Total Bilirubin 0.3 - 1.2 mg/dL 0.3 - 0.3  Alkaline Phos 39 - 117 U/L 66 - 68  AST 0 - 37 U/L 22 - 30  ALT 0 - 35 U/L 25 - 42(H)    Lab Results  Component Value Date   TSH 1.200 11/13/2014    Lipid Panel     Component Value Date/Time   CHOL 160 08/30/2012 0440   TRIG 77 08/30/2012 0440   HDL 62 08/30/2012 0440   CHOLHDL 2.6 08/30/2012 0440   VLDL 15 08/30/2012 0440   LDLCALC 83 08/30/2012 0440   LDLDIRECT 132.8 08/16/2007 1118    Diagnostic Studies Reviewed 02/05/14 Echo: Left ventricle: moderate concentric hypertrophy. EF= 60% - 65%.  -(grade 2 diastolicdysfunction). - Aortic valve: mild stenosis.  - Mitral valve: appears to be a mobile density off of the anterior MV leaflet that is only appreciated in the apical views and may represent a vegetation. Moderate-severe regurgitation  - Left atrium: mildly dilated. - Right ventricle: mildly dilated. - Tricuspid valve: Moderate regurgitation. - Pulmonary arteries: PA peak pressure: 19mm Hg (S).  11/16/14 CXR: Minimal blunting of the left costophrenic angle which may be due to small amount of pleural fluid versus atelectasis. Hiatal hernia. Mild cardiomegaly.  Assessment & Plan 1. Essential hypertension Stable. Continue losartan 50mg  daily, lopressor 12.5mg  twice daily, and lasix 40mg  twice daily. Continue to monitor  2. Acute respiratory failure with hypoxia  Multiple causes: CHF, pulmonary hypertension, HOCM, and afib. Continue oxygen therapy to maintain o2 sat >88%. Continue duoneb every four hours as needed for shortness of breath and wheezing. Continue to monitor  3. Atrial fibrillation with RVR RRR on exam. Not a candidate for oral anticoagulation secondary to hx of falls and GI bleeds. Continue amiodarone 200mg  twice daily, lopressor 12.5mg  twice daily, asa 81mg  daily, and cardizem 30mg  every six hours as  needed for HR>120. Amiodarone dose reduction in 1-2 weeks was recommended. Continue to monitor patient status.   4. Hypothyroidism, unspecified hypothyroidism type Continue synthroid 100mcg daily 30 minutes before breakfast and monitor  5. Physical deconditioning Continue to work with PT/OT for gait/balance/strength training. Fall risk and pressure ulcer precautions  6. Insomnia Continue ambien 5mg  as needed for sleep and monitor  7. GERD Continue omeprazole 40mg  daily 30 minutes before breakfast and monitor  8. Allergic rhinitis Continue flonase 1 spray in each nostril daily and monitor  9. Osteoarthritis, generalized Continue tylenol 650mg  every six hours as needed for mild pain and norco 5/325 1/2 to 1 tab twice daily as needed for moderate to severe pain. Continue to monitor  10. Acute on chronic diastolic HF Euvolemic on exam. Continue lasix 40mg  twice daily. Daily weight and continue to monitor.   Time spent: 50 minutes on care coordination   Family/Staff Communication Plan of care discussed with patient, son, and nursing staff. Patient, son, and nursing staff verbalized understanding and agree with plan of care. No additional questions or concerns reported.    Arthur Holms, MSN, AGNP-C [facility] [address] [place], [place] [address] [phone] [8am-5pm] After hours: [phone]

## 2014-11-21 ENCOUNTER — Non-Acute Institutional Stay (SKILLED_NURSING_FACILITY): Payer: Medicare Other | Admitting: Internal Medicine

## 2014-11-21 ENCOUNTER — Encounter: Payer: Self-pay | Admitting: Internal Medicine

## 2014-11-21 DIAGNOSIS — F32A Depression, unspecified: Secondary | ICD-10-CM

## 2014-11-21 DIAGNOSIS — R5381 Other malaise: Secondary | ICD-10-CM

## 2014-11-21 DIAGNOSIS — I4891 Unspecified atrial fibrillation: Secondary | ICD-10-CM

## 2014-11-21 DIAGNOSIS — E876 Hypokalemia: Secondary | ICD-10-CM

## 2014-11-21 DIAGNOSIS — F329 Major depressive disorder, single episode, unspecified: Secondary | ICD-10-CM

## 2014-11-21 DIAGNOSIS — G4700 Insomnia, unspecified: Secondary | ICD-10-CM

## 2014-11-21 DIAGNOSIS — E039 Hypothyroidism, unspecified: Secondary | ICD-10-CM

## 2014-11-21 DIAGNOSIS — I10 Essential (primary) hypertension: Secondary | ICD-10-CM

## 2014-11-21 DIAGNOSIS — E46 Unspecified protein-calorie malnutrition: Secondary | ICD-10-CM

## 2014-11-21 DIAGNOSIS — I5031 Acute diastolic (congestive) heart failure: Secondary | ICD-10-CM

## 2014-11-21 DIAGNOSIS — D638 Anemia in other chronic diseases classified elsewhere: Secondary | ICD-10-CM

## 2014-11-21 NOTE — Progress Notes (Signed)
Patient ID: Cindy [name], female   DOB: September 20, 1922, 80 y.o.   MRN: [id number]     Facility: [facility]    PCP: Elsie Stain, MD  Code Status: DNR  Allergies  Allergen Reactions  . Lipitor [Atorvastatin] Nausea Only and Other (See Comments)    LFT elevation  . Morphine And Related Other (See Comments)    "drives me crazy" and hyperactivity  . Irbesartan Swelling  . Ramipril Swelling    REACTION: lips swelling  . Telmisartan-Hctz Other (See Comments)    REACTION: incontinence  . Amiodarone Other (See Comments)    'terrible headaches'  . Hydrochlorothiazide Other (See Comments)    hyponatremia  . Metoprolol Other (See Comments)    Headache, dizzy, "terribly sick"  . Other Other (See Comments)    ANTICOAGULANTS - not a candidate due to history of falls and bleeding  . Tikosyn [Dofetilide] Other (See Comments)    Not a candidate due to Prolonged QT  . Norpace [Disopyramide] Other (See Comments)    Dry mouth    Chief Complaint  Patient presents with  . New Admit To SNF     HPI:  80 y/o female patient is here for short term rehabilitation post hospital admission 11/11/14-11/18/14 with acute respiratory failure thought to be multifactorial with her chf, afib and pulmonary HTN all contributing to it. She was started on iv lasix and cardiology was consulted. She diuresed well and lost 10 lbs. She also had afib with RVR. Metoprolol was introduced. With her co-morbidities and malnutrition given overall poor prognosis, palliative care was consulted in hospital.  She has PMH of paroxysmal afib, hypertrophic cardiomyopathy, CAD,  hypothyroidism, depression, macular degeneration-legally blind in both eyes, urinary incontinence. She is seen in her room today with her son and friend present. She complaints about feeling low and is tearful. She mentions her appetite is fair but as per son she picks on her food and eats very little. She also mentions that she is  having trouble with her sleep and staff wake her up at 5 am to weigh her. This bothers her  Review of Systems:  Constitutional: Negative for fever, chills,diaphoresis.  HENT: Negative for congestion   Eyes: legally blind.  Respiratory: Negative for cough. Has dyspnea Cardiovascular: Negative for chest pain, palpitations, leg swelling.  Gastrointestinal: Negative for heartburn, nausea, vomiting, abdominal pain. Had a bowel movement today Genitourinary: Negative for dysuria Musculoskeletal: Negative for back pain, falls. Has OA pain Skin: Negative for itching.  Neurological: Negative for dizziness, tingling, focal weakness and headaches.  Psychiatric/Behavioral: positive for depression, insomnia   Past Medical History  Diagnosis Date  . Atrial fibrillation 04/23-24/2007    a. recurrent PAF with RVR in September 2013. b. Evaluated 03/2013, previously intolerant to Norpace and Amiodarone - consider Multaq if recurs. c. Not on anticoag due to history of falls and also some internal bleeding per son.  . Hypertrophic cardiomyopathy   . Urinary incontinence   . Diverticulosis of colon (without mention of hemorrhage) 2003/ 08/2000    EGD/colonoscopy Barretts esophagus//H.H divertics 08/2000  . Cervical mass     C2 lateral mass fracture  . Hypertension   . Hypercholesterolemia     219/497  . Hypothyroidism   . Multinodular goiter (nontoxic)   . Osteoporosis   . Blood transfusion     "my blood count was a little low"  . Jaundice ~ 1935    "in grade school"  . Degenerative joint disease  back  . Depression     "husband died 02-17-2011"  . Personal history of colonic polyps 02/29/2012    tubular adenoma  . Barrett's esophagus   . CAD (coronary artery disease)     a. NSTEMI 03/2013: secondary to diagonal disease (small, not amenable to PCI, for med rx).  . Moderate mitral regurgitation 2014  . Mild aortic stenosis 2014  . Lung nodule seen on imaging study, pt does not wish further work  up 02/08/2014  . Mitral valve regurgitation, mod to severe with moderately calcified annulus 02/08/2014  . Mitral valve mass, density - no fevers to suggest endocarditis 02/08/2014  . HOH (hard of hearing)   . Shortness of breath   . CHF (congestive heart failure)   . Heart murmur   . Anemia, iron deficiency   . GERD (gastroesophageal reflux disease)   . Legally blind     "both eyes/Dr. Zigmund Daniel" (11/11/2014)  . Macular degeneration of both eyes    Past Surgical History  Procedure Laterality Date  . Bladder surgery  1990's    bladder tack early 90's  . Tear duct probing  07/29/03    tear duct surg  . Cystourethroscopy  09/17/03  . Rotator cuff repair Bilateral ? date; 09/07/05    left; right( Dr. Gladstone Lighter)  . Appendectomy  1941  . Thyroid ultrasound  10/14/2003    MNG, no dominant masses  . Doppler echocardiography  03/05/2002&09/11/2003    ECHO, EF wnl, mild stenosis, A.S. mild MR, Mild T.R03/31/2003//ECHO EF 70%,LVH, ?diast dysfunction 09/11/2003  . Cataract extraction, bilateral  2003  . Dilation and curettage of uterus  09/07/2000    endometrial polyps removed, path all benign   . Tonsillectomy and adenoidectomy      "as a child"  . Fracture surgery  2010    right knee  . Knee arthroscopy Right   . Reduction mammaplasty  1981  . Incontinence surgery      "she's had a tack and a sling"  . Left heart catheterization with coronary angiogram N/A 03/27/2013    Procedure: LEFT HEART CATHETERIZATION WITH CORONARY ANGIOGRAM;  Surgeon: Peter M Martinique, MD;  Location: [facility] CATH LAB;  Service: Cardiovascular;  Laterality: N/A;   Social History:   reports that she quit smoking about 40 years ago. Her smoking use included Cigarettes. She has a 2 pack-year smoking history. She has never used smokeless tobacco. She reports that she does not drink alcohol or use illicit drugs.  Family History  Problem Relation Age of Onset  . Heart failure Mother     CHF, DM, HBP  . Hypertension Mother   .  Uterine cancer Mother   . Stroke Mother   . Colon cancer Neg Hx   . Esophageal cancer Neg Hx   . Rectal cancer Neg Hx   . Stomach cancer Neg Hx     Medications: Patient's Medications  New Prescriptions   No medications on file  Previous Medications   ACETAMINOPHEN (TYLENOL) 325 MG TABLET    Take 2 tablets (650 mg total) by mouth every 6 (six) hours as needed for mild pain (or Fever >/= 101).   AMIODARONE (PACERONE) 200 MG TABLET    Take 1 tablet (200 mg total) by mouth 2 (two) times daily.   ASPIRIN 81 MG CHEWABLE TABLET    Chew 1 tablet (81 mg total) by mouth daily.   BESIVANCE 0.6 % SUSP    Place 1 drop into both eyes 2 (two) times daily.  DILTIAZEM (CARDIZEM) 30 MG TABLET    Take 1 tablet (30 mg total) by mouth every 6 (six) hours as needed (rapid heart rate.).   DOCUSATE SODIUM (COLACE) 100 MG CAPSULE    Take 100 mg by mouth daily as needed for mild constipation.   FLUTICASONE (FLONASE) 50 MCG/ACT NASAL SPRAY    Place 1 spray into both nostrils daily.   FUROSEMIDE (LASIX) 40 MG TABLET    Take 1 tablet (40 mg total) by mouth 2 (two) times daily.   GUAIFENESIN-DEXTROMETHORPHAN (ROBITUSSIN DM) 100-10 MG/5ML SYRUP    Take 5 mLs by mouth every 4 (four) hours as needed for cough.   HYDROCODONE-ACETAMINOPHEN (NORCO/VICODIN) 5-325 MG PER TABLET    Take 0.5-1 tablets by mouth 2 (two) times daily as needed for moderate pain.   IPRATROPIUM-ALBUTEROL (DUONEB) 0.5-2.5 (3) MG/3ML SOLN    Take 3 mLs by nebulization every 4 (four) hours as needed.   LEVOTHYROXINE (SYNTHROID, LEVOTHROID) 25 MCG TABLET    Take 1 tablet (25 mcg total) by mouth every morning.   LOSARTAN (COZAAR) 50 MG TABLET    Take 50 mg by mouth daily.    METOPROLOL TARTRATE (LOPRESSOR) 12.5 MG TABS TABLET    Take 0.5 tablets (12.5 mg total) by mouth 2 (two) times daily.   MULTIPLE VITAMINS-MINERALS (OCUVITE PRESERVISION) TABS    Take 1 tablet by mouth 2 (two) times daily.    OMEPRAZOLE (PRILOSEC) 40 MG CAPSULE    Take 40 mg by mouth  daily.   ZOLPIDEM (AMBIEN) 5 MG TABLET    Take 1 tablet (5 mg total) by mouth at bedtime as needed for sleep.  Modified Medications   No medications on file  Discontinued Medications   No medications on file     Physical Exam: Filed Vitals:   11/21/14 1416  BP: 109/60  Pulse: 64  Temp: 98.2 F (36.8 C)  Resp: 18  SpO2: 97%    General- elderly female in no acute distress Head- atraumatic, normocephalic Neck- no cervical lymphadenopathy Throat- moist mucus membrane Cardiovascular- normal R4,E3, systolic murmurs Respiratory- bilateral decreased air entry, on o2 by nasal canula, no wheeze, no rhonchi, no crackles, no use of accessory muscles Abdomen- bowel sounds present, soft, non tender Musculoskeletal- able to move all 4 extremities, generalized weakness, no leg edema  Neurological- no focal deficit Skin- warm and dry, rash in groin area Psychiatry- alert and oriented, tearful    Labs reviewed: Basic Metabolic Panel:  Recent Labs  02/04/14 1900  08/19/14 1117  09/16/14 1957  11/14/14 0656 11/15/14 0500 11/16/14 0325  NA  --   < > 126*  < >  --   < > 135* 134* 135*  K  --   < > 5.5*  < >  --   < > 3.7 3.4* 3.5*  CL  --   < > 96  < >  --   < > 93* 90* 92*  CO2  --   < > 25  < >  --   < > 26 30 27   GLUCOSE  --   < > 135*  < >  --   < > 106* 92 107*  BUN  --   < > 11  < >  --   < > 26* 19 19  CREATININE 0.78  < > 0.51  < >  --   < > 0.92 0.94 0.99  CALCIUM  --   < > 9.3  < >  --   < >  8.2* 8.4 8.5  MG 2.1  --  2.1  --  2.1  --   --   --   --   < > = values in this interval not displayed. Liver Function Tests:  Recent Labs  11/11/14 1028 11/14/14 0656 11/16/14 0325  AST 23 30 22   ALT 19 42* 25  ALKPHOS 79 68 66  BILITOT 0.3 0.3 0.3  PROT 6.3 5.7* 5.8*  ALBUMIN 3.0* 2.7* 2.7*   No results for input(s): LIPASE, AMYLASE in the last 8760 hours. No results for input(s): AMMONIA in the last 8760 hours. CBC:  Recent Labs  07/01/14 0910  11/11/14 1028   11/12/14 0332 11/14/14 0626 11/16/14 0325  WBC 5.7  < > 6.0  < > 7.1 10.9* 7.3  NEUTROABS 3.7  --  5.0  --   --   --  4.8  HGB 10.1*  < > 9.2*  < > 8.7* 10.0* 9.9*  HCT 32.3*  < > 32.2*  < > 31.0* 35.1* 32.9*  MCV 71.4*  < > 75.6*  < > 76.4* 77.3* 74.9*  PLT 273.0  < > 280  < > 288 280 298  < > = values in this interval not displayed. Cardiac Enzymes:  Recent Labs  11/11/14 1730 11/11/14 2146 11/12/14 0332  TROPONINI <0.30 <0.30 <0.30   Radiological Exams: echocardiogram; Left ventricle: moderate concentric hypertrophy. EF= 60% - 65%.   -(grade 2 diastolic dysfunction). - Aortic valve: mild stenosis.   - Mitral valve: appears to be a mobile density off of the anterior MV leaflet that is only appreciated in the   apical views and may represent a vegetation. Moderate-severe regurgitation   - Left atrium:  mildly dilated. - Right ventricle: mildly dilated. - Tricuspid valve: Moderate regurgitation. - Pulmonary arteries: PA peak pressure: 87mm Hg (S).   Assessment/Plan  Physical deconditioning Will have patient work with PT/OT as tolerated to regain strength and restore function.  Fall precautions are in place. Will get palliative care consult to help review goals of care and focus on comfort care with her malnutrition and medical co-morbidities  CHF Monitor daily weight but change it to after 7 am. Continue o2 by nasal canula. Continue lopressor and lasix current regimen. Reduce losartan to 25 mg daily with low bp readings. Check bmp.   Hypokalemia With her on lasix, check bmp and consider kcl supplement if indicated  Anemia Likely has anemia of chronic disease, check h&h  Protein calorie malnutrition remeron hopefully will help with her appetite some. Monitor po intake, decline anticipated  Essential hypertension Stable with bp on lower side of normal. Change losartan to 25mg  daily. continue lopressor 12.5mg  twice daily and lasix 40mg  twice daily. Check  bp  Depression Start remeron 15 mg daily and reassess  Atrial fibrillation Continue amiodarone 200mg  twice daily, lopressor 12.5mg  twice daily, asa 81mg  daily, and cardizem 30mg  every six hours as needed for HR>120.   Hypothyroidism Continue synthroid 18mcg daily   Insomnia Change ambien to 5 mg daily at bedtime for now  GERD Continue omeprazole 40mg  daily  Osteoarthritis Continue tylenol 650mg  every six hours as needed for mild pain and norco 5/325 1/2 to 1 tab twice daily as needed for moderate to severe pain. Fall precautions   Goals of care: short term rehabilitation    Labs/tests ordered: cmp, cbc    Blanchie Serve, MD  [facility] [phone] (Monday-Friday 8 am - 5 pm) [phone] (afterhours)

## 2014-11-27 ENCOUNTER — Other Ambulatory Visit: Payer: Self-pay | Admitting: *Deleted

## 2014-11-27 MED ORDER — OXYCODONE HCL 20 MG/ML PO CONC: ORAL | Status: DC

## 2014-11-27 NOTE — Telephone Encounter (Signed)
[facility] 

## 2014-11-28 ENCOUNTER — Encounter (HOSPITAL_COMMUNITY)
Admission: EM | Disposition: A | Discharge status: Skilled Nursing Facility | Payer: Self-pay | Source: Home / Self Care | Attending: Internal Medicine

## 2014-11-28 ENCOUNTER — Encounter (HOSPITAL_COMMUNITY): Payer: Self-pay | Admitting: Emergency Medicine

## 2014-11-28 ENCOUNTER — Inpatient Hospital Stay (HOSPITAL_COMMUNITY): Payer: Medicare Other

## 2014-11-28 ENCOUNTER — Inpatient Hospital Stay (HOSPITAL_COMMUNITY): Payer: Medicare Other | Admitting: Anesthesiology

## 2014-11-28 ENCOUNTER — Inpatient Hospital Stay (HOSPITAL_COMMUNITY)
Admission: EM | Admit: 2014-11-28 | Discharge: 2014-12-02 | DRG: 480 | Disposition: A | Discharge status: Skilled Nursing Facility | Payer: Medicare Other | Attending: Internal Medicine | Admitting: Internal Medicine

## 2014-11-28 ENCOUNTER — Emergency Department (HOSPITAL_COMMUNITY): Payer: Medicare Other

## 2014-11-28 DIAGNOSIS — Z86010 Personal history of colonic polyps: Secondary | ICD-10-CM | POA: Diagnosis not present

## 2014-11-28 DIAGNOSIS — R7989 Other specified abnormal findings of blood chemistry: Secondary | ICD-10-CM

## 2014-11-28 DIAGNOSIS — I272 Other secondary pulmonary hypertension: Secondary | ICD-10-CM | POA: Diagnosis present

## 2014-11-28 DIAGNOSIS — Z9181 History of falling: Secondary | ICD-10-CM | POA: Diagnosis not present

## 2014-11-28 DIAGNOSIS — E039 Hypothyroidism, unspecified: Secondary | ICD-10-CM | POA: Diagnosis present

## 2014-11-28 DIAGNOSIS — I10 Essential (primary) hypertension: Secondary | ICD-10-CM | POA: Diagnosis present

## 2014-11-28 DIAGNOSIS — I350 Nonrheumatic aortic (valve) stenosis: Secondary | ICD-10-CM | POA: Diagnosis present

## 2014-11-28 DIAGNOSIS — M419 Scoliosis, unspecified: Secondary | ICD-10-CM | POA: Diagnosis present

## 2014-11-28 DIAGNOSIS — Z419 Encounter for procedure for purposes other than remedying health state, unspecified: Secondary | ICD-10-CM

## 2014-11-28 DIAGNOSIS — M1990 Unspecified osteoarthritis, unspecified site: Secondary | ICD-10-CM | POA: Diagnosis present

## 2014-11-28 DIAGNOSIS — I700 Atherosclerosis of aorta: Secondary | ICD-10-CM | POA: Diagnosis present

## 2014-11-28 DIAGNOSIS — Z7982 Long term (current) use of aspirin: Secondary | ICD-10-CM

## 2014-11-28 DIAGNOSIS — D509 Iron deficiency anemia, unspecified: Secondary | ICD-10-CM | POA: Diagnosis present

## 2014-11-28 DIAGNOSIS — I509 Heart failure, unspecified: Secondary | ICD-10-CM

## 2014-11-28 DIAGNOSIS — Z66 Do not resuscitate: Secondary | ICD-10-CM | POA: Diagnosis present

## 2014-11-28 DIAGNOSIS — H548 Legal blindness, as defined in USA: Secondary | ICD-10-CM | POA: Diagnosis present

## 2014-11-28 DIAGNOSIS — Z01810 Encounter for preprocedural cardiovascular examination: Secondary | ICD-10-CM

## 2014-11-28 DIAGNOSIS — Y9389 Activity, other specified: Secondary | ICD-10-CM

## 2014-11-28 DIAGNOSIS — S7290XA Unspecified fracture of unspecified femur, initial encounter for closed fracture: Secondary | ICD-10-CM | POA: Insufficient documentation

## 2014-11-28 DIAGNOSIS — S72142A Displaced intertrochanteric fracture of left femur, initial encounter for closed fracture: Secondary | ICD-10-CM | POA: Diagnosis present

## 2014-11-28 DIAGNOSIS — Z87891 Personal history of nicotine dependence: Secondary | ICD-10-CM

## 2014-11-28 DIAGNOSIS — E780 Pure hypercholesterolemia: Secondary | ICD-10-CM | POA: Diagnosis present

## 2014-11-28 DIAGNOSIS — S72002A Fracture of unspecified part of neck of left femur, initial encounter for closed fracture: Secondary | ICD-10-CM | POA: Diagnosis present

## 2014-11-28 DIAGNOSIS — M8580 Other specified disorders of bone density and structure, unspecified site: Secondary | ICD-10-CM | POA: Diagnosis present

## 2014-11-28 DIAGNOSIS — E871 Hypo-osmolality and hyponatremia: Secondary | ICD-10-CM | POA: Diagnosis present

## 2014-11-28 DIAGNOSIS — H3530 Unspecified macular degeneration: Secondary | ICD-10-CM | POA: Diagnosis present

## 2014-11-28 DIAGNOSIS — K2270 Barrett's esophagus without dysplasia: Secondary | ICD-10-CM | POA: Diagnosis present

## 2014-11-28 DIAGNOSIS — I2510 Atherosclerotic heart disease of native coronary artery without angina pectoris: Secondary | ICD-10-CM | POA: Diagnosis present

## 2014-11-28 DIAGNOSIS — W19XXXA Unspecified fall, initial encounter: Secondary | ICD-10-CM | POA: Diagnosis present

## 2014-11-28 DIAGNOSIS — E785 Hyperlipidemia, unspecified: Secondary | ICD-10-CM | POA: Diagnosis present

## 2014-11-28 DIAGNOSIS — R32 Unspecified urinary incontinence: Secondary | ICD-10-CM | POA: Diagnosis present

## 2014-11-28 DIAGNOSIS — K219 Gastro-esophageal reflux disease without esophagitis: Secondary | ICD-10-CM | POA: Diagnosis present

## 2014-11-28 DIAGNOSIS — F329 Major depressive disorder, single episode, unspecified: Secondary | ICD-10-CM | POA: Diagnosis present

## 2014-11-28 DIAGNOSIS — I340 Nonrheumatic mitral (valve) insufficiency: Secondary | ICD-10-CM | POA: Diagnosis present

## 2014-11-28 DIAGNOSIS — Z515 Encounter for palliative care: Secondary | ICD-10-CM

## 2014-11-28 DIAGNOSIS — K449 Diaphragmatic hernia without obstruction or gangrene: Secondary | ICD-10-CM | POA: Diagnosis present

## 2014-11-28 DIAGNOSIS — I480 Paroxysmal atrial fibrillation: Secondary | ICD-10-CM | POA: Diagnosis present

## 2014-11-28 DIAGNOSIS — K5730 Diverticulosis of large intestine without perforation or abscess without bleeding: Secondary | ICD-10-CM | POA: Diagnosis present

## 2014-11-28 DIAGNOSIS — I422 Other hypertrophic cardiomyopathy: Secondary | ICD-10-CM | POA: Diagnosis present

## 2014-11-28 DIAGNOSIS — Y9289 Other specified places as the place of occurrence of the external cause: Secondary | ICD-10-CM | POA: Diagnosis not present

## 2014-11-28 DIAGNOSIS — Z888 Allergy status to other drugs, medicaments and biological substances status: Secondary | ICD-10-CM | POA: Diagnosis not present

## 2014-11-28 DIAGNOSIS — Z6824 Body mass index (BMI) 24.0-24.9, adult: Secondary | ICD-10-CM | POA: Diagnosis not present

## 2014-11-28 DIAGNOSIS — I5032 Chronic diastolic (congestive) heart failure: Secondary | ICD-10-CM | POA: Diagnosis present

## 2014-11-28 DIAGNOSIS — M810 Age-related osteoporosis without current pathological fracture: Secondary | ICD-10-CM | POA: Diagnosis present

## 2014-11-28 DIAGNOSIS — E43 Unspecified severe protein-calorie malnutrition: Secondary | ICD-10-CM | POA: Diagnosis present

## 2014-11-28 DIAGNOSIS — S72009A Fracture of unspecified part of neck of unspecified femur, initial encounter for closed fracture: Secondary | ICD-10-CM

## 2014-11-28 HISTORY — DX: Hyperlipidemia, unspecified: E78.5

## 2014-11-28 HISTORY — PX: FEMUR IM NAIL: SHX1597

## 2014-11-28 LAB — COMPREHENSIVE METABOLIC PANEL
ALT: 19 U/L (ref 0–35)
AST: 25 U/L (ref 0–37)
Albumin: 3.1 g/dL — ABNORMAL LOW (ref 3.5–5.2)
Alkaline Phosphatase: 58 U/L (ref 39–117)
Anion gap: 8 (ref 5–15)
BUN: 13 mg/dL (ref 6–23)
CO2: 28 mmol/L (ref 19–32)
CREATININE: 1.01 mg/dL (ref 0.50–1.10)
Calcium: 8.7 mg/dL (ref 8.4–10.5)
Chloride: 98 mEq/L (ref 96–112)
GFR, EST AFRICAN AMERICAN: 55 mL/min — AB (ref >90)
GFR, EST NON AFRICAN AMERICAN: 48 mL/min — AB (ref >90)
Glucose, Bld: 109 mg/dL — ABNORMAL HIGH (ref 70–99)
Potassium: 4.4 mmol/L (ref 3.5–5.1)
Sodium: 134 mmol/L — ABNORMAL LOW (ref 135–145)
Total Bilirubin: 0.6 mg/dL (ref 0.3–1.2)
Total Protein: 5.5 g/dL — ABNORMAL LOW (ref 6.0–8.3)

## 2014-11-28 LAB — CBC WITH DIFFERENTIAL/PLATELET
Basophils Absolute: 0 10*3/uL (ref 0.0–0.1)
Basophils Relative: 0 % (ref 0–1)
EOS PCT: 2 % (ref 0–5)
Eosinophils Absolute: 0.1 10*3/uL (ref 0.0–0.7)
HEMATOCRIT: 31.9 % — AB (ref 36.0–46.0)
Hemoglobin: 9 g/dL — ABNORMAL LOW (ref 12.0–15.0)
LYMPHS ABS: 1.4 10*3/uL (ref 0.7–4.0)
LYMPHS PCT: 20 % (ref 12–46)
MCH: 21.6 pg — ABNORMAL LOW (ref 26.0–34.0)
MCHC: 28.2 g/dL — ABNORMAL LOW (ref 30.0–36.0)
MCV: 76.5 fL — AB (ref 78.0–100.0)
MONO ABS: 0.7 10*3/uL (ref 0.1–1.0)
Monocytes Relative: 10 % (ref 3–12)
Neutro Abs: 4.9 10*3/uL (ref 1.7–7.7)
Neutrophils Relative %: 68 % (ref 43–77)
Platelets: 223 10*3/uL (ref 150–400)
RBC: 4.17 MIL/uL (ref 3.87–5.11)
RDW: 17.7 % — AB (ref 11.5–15.5)
WBC: 7.1 10*3/uL (ref 4.0–10.5)

## 2014-11-28 LAB — URINALYSIS, ROUTINE W REFLEX MICROSCOPIC
Bilirubin Urine: NEGATIVE
Glucose, UA: NEGATIVE mg/dL
HGB URINE DIPSTICK: NEGATIVE
Ketones, ur: NEGATIVE mg/dL
LEUKOCYTES UA: NEGATIVE
Nitrite: NEGATIVE
PROTEIN: NEGATIVE mg/dL
Specific Gravity, Urine: 1.01 (ref 1.005–1.030)
UROBILINOGEN UA: 0.2 mg/dL (ref 0.0–1.0)
pH: 6.5 (ref 5.0–8.0)

## 2014-11-28 LAB — TROPONIN I: Troponin I: 0.06 ng/mL — ABNORMAL HIGH (ref <0.031)

## 2014-11-28 LAB — BRAIN NATRIURETIC PEPTIDE: B Natriuretic Peptide: 577.8 pg/mL — ABNORMAL HIGH (ref 0.0–100.0)

## 2014-11-28 LAB — PREPARE RBC (CROSSMATCH)

## 2014-11-28 SURGERY — INSERTION, INTRAMEDULLARY ROD, FEMUR
Anesthesia: General | Site: Hip | Laterality: Left

## 2014-11-28 MED ORDER — FERROUS FUMARATE 325 (106 FE) MG PO TABS
1.0000 | ORAL_TABLET | Freq: Two times a day (BID) | ORAL | Status: DC
  Administered 2014-11-29 – 2014-12-02 (×7): 106 mg via ORAL
  Filled 2014-11-28 (×9): qty 1

## 2014-11-28 MED ORDER — HYDROCODONE-ACETAMINOPHEN 5-325 MG PO TABS
1.0000 | ORAL_TABLET | Freq: Four times a day (QID) | ORAL | Status: DC | PRN
  Administered 2014-11-30 – 2014-12-01 (×5): 1 via ORAL
  Administered 2014-12-02 (×2): 2 via ORAL
  Filled 2014-11-28 (×6): qty 1
  Filled 2014-11-28 (×2): qty 2

## 2014-11-28 MED ORDER — LEVOTHYROXINE SODIUM 25 MCG PO TABS
25.0000 ug | ORAL_TABLET | Freq: Every day | ORAL | Status: DC
  Administered 2014-11-29 – 2014-12-02 (×4): 25 ug via ORAL
  Filled 2014-11-28 (×5): qty 1

## 2014-11-28 MED ORDER — ACETAMINOPHEN 325 MG PO TABS: 650.0000 mg | ORAL_TABLET | Freq: Four times a day (QID) | ORAL | Status: DC | PRN

## 2014-11-28 MED ORDER — HYDROMORPHONE HCL 1 MG/ML IJ SOLN
0.4000 mg | INTRAMUSCULAR | Status: DC | PRN
  Administered 2014-11-28 – 2014-11-29 (×2): 0.4 mg via INTRAVENOUS
  Filled 2014-11-28 (×2): qty 1

## 2014-11-28 MED ORDER — BUPIVACAINE HCL (PF) 0.5 % IJ SOLN
INTRAMUSCULAR | Status: DC | PRN
  Administered 2014-11-28: 10 mL

## 2014-11-28 MED ORDER — AMIODARONE HCL 200 MG PO TABS
200.0000 mg | ORAL_TABLET | Freq: Two times a day (BID) | ORAL | Status: DC
  Administered 2014-11-28 – 2014-11-29 (×3): 200 mg via ORAL
  Filled 2014-11-28 (×6): qty 1

## 2014-11-28 MED ORDER — PROPOFOL 10 MG/ML IV BOLUS
INTRAVENOUS | Status: AC
  Filled 2014-11-28: qty 20

## 2014-11-28 MED ORDER — FENTANYL CITRATE 0.05 MG/ML IJ SOLN
INTRAMUSCULAR | Status: AC
  Filled 2014-11-28: qty 5

## 2014-11-28 MED ORDER — ONDANSETRON HCL 4 MG/2ML IJ SOLN: 4.0000 mg | Freq: Once | INTRAMUSCULAR | Status: DC | PRN

## 2014-11-28 MED ORDER — FLEET ENEMA 7-19 GM/118ML RE ENEM: 1.0000 | ENEMA | Freq: Once | RECTAL | Status: AC | PRN

## 2014-11-28 MED ORDER — LACTATED RINGERS IV SOLN
INTRAVENOUS | Status: DC | PRN
  Administered 2014-11-28: 16:00:00 via INTRAVENOUS

## 2014-11-28 MED ORDER — FUROSEMIDE 40 MG PO TABS
40.0000 mg | ORAL_TABLET | Freq: Two times a day (BID) | ORAL | Status: DC
  Administered 2014-11-29 – 2014-12-02 (×7): 40 mg via ORAL
  Filled 2014-11-28 (×9): qty 1

## 2014-11-28 MED ORDER — ONDANSETRON HCL 4 MG PO TABS: 4.0000 mg | ORAL_TABLET | Freq: Four times a day (QID) | ORAL | Status: DC | PRN

## 2014-11-28 MED ORDER — PHENYLEPHRINE HCL 10 MG/ML IJ SOLN
INTRAMUSCULAR | Status: DC | PRN
  Administered 2014-11-28 (×5): 80 ug via INTRAVENOUS

## 2014-11-28 MED ORDER — CEFAZOLIN SODIUM-DEXTROSE 2-3 GM-% IV SOLR
2.0000 g | Freq: Four times a day (QID) | INTRAVENOUS | Status: AC
  Administered 2014-11-28 – 2014-11-29 (×2): 2 g via INTRAVENOUS
  Filled 2014-11-28 (×2): qty 50

## 2014-11-28 MED ORDER — DOCUSATE SODIUM 100 MG PO CAPS: 100.0000 mg | ORAL_CAPSULE | Freq: Every day | ORAL | Status: DC | PRN

## 2014-11-28 MED ORDER — FLUTICASONE PROPIONATE 50 MCG/ACT NA SUSP
1.0000 | Freq: Every day | NASAL | Status: DC
  Administered 2014-12-02: 1 via NASAL
  Filled 2014-11-28: qty 16

## 2014-11-28 MED ORDER — NEOSTIGMINE METHYLSULFATE 10 MG/10ML IV SOLN
INTRAVENOUS | Status: DC | PRN
  Administered 2014-11-28: 1 mg via INTRAVENOUS

## 2014-11-28 MED ORDER — ENOXAPARIN SODIUM 30 MG/0.3ML ~~LOC~~ SOLN
30.0000 mg | SUBCUTANEOUS | Status: DC
  Administered 2014-11-29 – 2014-12-02 (×4): 30 mg via SUBCUTANEOUS
  Filled 2014-11-28 (×5): qty 0.3

## 2014-11-28 MED ORDER — METOCLOPRAMIDE HCL 10 MG PO TABS: 5.0000 mg | ORAL_TABLET | Freq: Three times a day (TID) | ORAL | Status: DC | PRN

## 2014-11-28 MED ORDER — LOSARTAN POTASSIUM 25 MG PO TABS
25.0000 mg | ORAL_TABLET | Freq: Every day | ORAL | Status: DC
  Administered 2014-11-29 – 2014-12-02 (×3): 25 mg via ORAL
  Filled 2014-11-28 (×4): qty 1

## 2014-11-28 MED ORDER — 0.9 % SODIUM CHLORIDE (POUR BTL) OPTIME
TOPICAL | Status: DC | PRN
  Administered 2014-11-28: 1000 mL

## 2014-11-28 MED ORDER — GLYCOPYRROLATE 0.2 MG/ML IJ SOLN
INTRAMUSCULAR | Status: AC
  Filled 2014-11-28: qty 1

## 2014-11-28 MED ORDER — PHENYLEPHRINE 40 MCG/ML (10ML) SYRINGE FOR IV PUSH (FOR BLOOD PRESSURE SUPPORT)
PREFILLED_SYRINGE | INTRAVENOUS | Status: AC
  Filled 2014-11-28: qty 10

## 2014-11-28 MED ORDER — ASPIRIN 81 MG PO CHEW
81.0000 mg | CHEWABLE_TABLET | Freq: Every day | ORAL | Status: DC
  Administered 2014-11-29 – 2014-12-01 (×3): 81 mg via ORAL
  Filled 2014-11-28 (×4): qty 1

## 2014-11-28 MED ORDER — GLYCOPYRROLATE 0.2 MG/ML IJ SOLN
INTRAMUSCULAR | Status: DC | PRN
  Administered 2014-11-28: 0.4 mg via INTRAVENOUS
  Administered 2014-11-28: 0.2 mg via INTRAVENOUS

## 2014-11-28 MED ORDER — PROPOFOL 10 MG/ML IV BOLUS
INTRAVENOUS | Status: DC | PRN
  Administered 2014-11-28: 75 mg via INTRAVENOUS

## 2014-11-28 MED ORDER — POLYETHYLENE GLYCOL 3350 17 G PO PACK: 17.0000 g | PACK | Freq: Every day | ORAL | Status: DC | PRN

## 2014-11-28 MED ORDER — ONDANSETRON HCL 4 MG/2ML IJ SOLN
INTRAMUSCULAR | Status: DC | PRN
  Administered 2014-11-28: 4 mg via INTRAVENOUS

## 2014-11-28 MED ORDER — OXYCODONE-ACETAMINOPHEN 5-325 MG PO TABS
1.0000 | ORAL_TABLET | Freq: Once | ORAL | Status: AC
  Administered 2014-11-28: 1 via ORAL
  Filled 2014-11-28 (×2): qty 1

## 2014-11-28 MED ORDER — ROCURONIUM BROMIDE 100 MG/10ML IV SOLN
INTRAVENOUS | Status: DC | PRN
  Administered 2014-11-28: 30 mg via INTRAVENOUS

## 2014-11-28 MED ORDER — IPRATROPIUM-ALBUTEROL 0.5-2.5 (3) MG/3ML IN SOLN: 3.0000 mL | RESPIRATORY_TRACT | Status: DC | PRN

## 2014-11-28 MED ORDER — MIRTAZAPINE 15 MG PO TABS
15.0000 mg | ORAL_TABLET | Freq: Every day | ORAL | Status: DC
  Administered 2014-11-29 – 2014-12-01 (×3): 15 mg via ORAL
  Filled 2014-11-28 (×4): qty 1

## 2014-11-28 MED ORDER — SODIUM CHLORIDE 0.45 % IV SOLN: INTRAVENOUS | Status: DC

## 2014-11-28 MED ORDER — FENTANYL CITRATE 0.05 MG/ML IJ SOLN
50.0000 ug | Freq: Once | INTRAMUSCULAR | Status: AC
  Administered 2014-11-28: 50 ug via INTRAVENOUS
  Filled 2014-11-28 (×3): qty 2

## 2014-11-28 MED ORDER — METOCLOPRAMIDE HCL 5 MG/ML IJ SOLN: 5.0000 mg | Freq: Three times a day (TID) | INTRAMUSCULAR | Status: DC | PRN

## 2014-11-28 MED ORDER — ROCURONIUM BROMIDE 50 MG/5ML IV SOLN
INTRAVENOUS | Status: AC
  Filled 2014-11-28: qty 1

## 2014-11-28 MED ORDER — HYDROMORPHONE HCL 1 MG/ML IJ SOLN: 0.2500 mg | INTRAMUSCULAR | Status: DC | PRN

## 2014-11-28 MED ORDER — FUROSEMIDE 10 MG/ML IJ SOLN
20.0000 mg | Freq: Once | INTRAMUSCULAR | Status: AC
  Administered 2014-11-29: 20 mg via INTRAVENOUS
  Filled 2014-11-28: qty 2

## 2014-11-28 MED ORDER — FENTANYL CITRATE 0.05 MG/ML IJ SOLN
INTRAMUSCULAR | Status: DC | PRN
  Administered 2014-11-28: 5 ug via INTRAVENOUS

## 2014-11-28 MED ORDER — DILTIAZEM HCL 30 MG PO TABS
30.0000 mg | ORAL_TABLET | Freq: Four times a day (QID) | ORAL | Status: DC | PRN
  Administered 2014-12-02: 30 mg via ORAL
  Filled 2014-11-28 (×2): qty 1

## 2014-11-28 MED ORDER — DOCUSATE SODIUM 100 MG PO CAPS
100.0000 mg | ORAL_CAPSULE | Freq: Two times a day (BID) | ORAL | Status: DC
  Administered 2014-11-28 – 2014-12-01 (×7): 100 mg via ORAL
  Filled 2014-11-28 (×9): qty 1

## 2014-11-28 MED ORDER — LIDOCAINE HCL (CARDIAC) 20 MG/ML IV SOLN
INTRAVENOUS | Status: DC | PRN
  Administered 2014-11-28: 100 mg via INTRAVENOUS

## 2014-11-28 MED ORDER — LACTATED RINGERS IV SOLN
INTRAVENOUS | Status: DC
  Administered 2014-11-28: 18:00:00 via INTRAVENOUS

## 2014-11-28 MED ORDER — CEFAZOLIN SODIUM-DEXTROSE 2-3 GM-% IV SOLR
INTRAVENOUS | Status: AC
  Administered 2014-11-28: 2 g via INTRAVENOUS
  Filled 2014-11-28: qty 50

## 2014-11-28 MED ORDER — ACETAMINOPHEN 650 MG RE SUPP: 650.0000 mg | Freq: Four times a day (QID) | RECTAL | Status: DC | PRN

## 2014-11-28 MED ORDER — METOPROLOL TARTRATE 12.5 MG HALF TABLET
12.5000 mg | ORAL_TABLET | Freq: Two times a day (BID) | ORAL | Status: DC
  Administered 2014-11-28 – 2014-12-01 (×6): 12.5 mg via ORAL
  Filled 2014-11-28 (×10): qty 1

## 2014-11-28 MED ORDER — PHENOL 1.4 % MT LIQD: 1.0000 | OROMUCOSAL | Status: DC | PRN

## 2014-11-28 MED ORDER — ONDANSETRON HCL 4 MG/2ML IJ SOLN: 4.0000 mg | Freq: Four times a day (QID) | INTRAMUSCULAR | Status: DC | PRN

## 2014-11-28 MED ORDER — HEPARIN SODIUM (PORCINE) 5000 UNIT/ML IJ SOLN
5000.0000 [IU] | Freq: Three times a day (TID) | INTRAMUSCULAR | Status: DC
Start: 2014-11-28 — End: 2014-11-28

## 2014-11-28 MED ORDER — OXYCODONE HCL 5 MG PO TABS
5.0000 mg | ORAL_TABLET | ORAL | Status: DC | PRN
  Administered 2014-11-29 – 2014-12-01 (×4): 5 mg via ORAL
  Filled 2014-11-28 (×2): qty 1
  Filled 2014-11-28: qty 2
  Filled 2014-11-28 (×2): qty 1

## 2014-11-28 MED ORDER — PANTOPRAZOLE SODIUM 40 MG PO TBEC
40.0000 mg | DELAYED_RELEASE_TABLET | Freq: Every day | ORAL | Status: DC
  Administered 2014-11-29 – 2014-12-02 (×4): 40 mg via ORAL
  Filled 2014-11-28 (×5): qty 1

## 2014-11-28 MED ORDER — FENTANYL CITRATE 0.05 MG/ML IJ SOLN: 50.0000 ug | INTRAMUSCULAR | Status: DC | PRN

## 2014-11-28 MED ORDER — PHENYLEPHRINE HCL 10 MG/ML IJ SOLN
10.0000 mg | INTRAVENOUS | Status: DC | PRN
  Administered 2014-11-28: 80 ug/min via INTRAVENOUS

## 2014-11-28 MED ORDER — ACETAMINOPHEN 325 MG PO TABS
650.0000 mg | ORAL_TABLET | Freq: Four times a day (QID) | ORAL | Status: DC | PRN
  Administered 2014-11-29 (×2): 650 mg via ORAL
  Filled 2014-11-28 (×2): qty 2

## 2014-11-28 MED ORDER — MENTHOL 3 MG MT LOZG: 1.0000 | LOZENGE | OROMUCOSAL | Status: DC | PRN

## 2014-11-28 MED ORDER — SENNA 8.6 MG PO TABS
1.0000 | ORAL_TABLET | Freq: Two times a day (BID) | ORAL | Status: DC
  Administered 2014-11-29 – 2014-12-02 (×7): 8.6 mg via ORAL
  Filled 2014-11-28 (×10): qty 1

## 2014-11-28 SURGICAL SUPPLY — 45 items
BLADE SURG 15 STRL LF DISP TIS (BLADE) ×1 IMPLANT
BLADE SURG 15 STRL SS (BLADE) ×3
BNDG COHESIVE 4X5 TAN STRL (GAUZE/BANDAGES/DRESSINGS) ×3 IMPLANT
COVER MAYO STAND STRL (DRAPES) ×3 IMPLANT
COVER PERINEAL POST (MISCELLANEOUS) ×3 IMPLANT
COVER SURGICAL LIGHT HANDLE (MISCELLANEOUS) ×3 IMPLANT
COVER TABLE BACK 60X90 (DRAPES) ×3 IMPLANT
DRAPE C-ARM 42X72 X-RAY (DRAPES) ×3 IMPLANT
DRAPE STERI IOBAN 125X83 (DRAPES) ×3 IMPLANT
DRSG ADAPTIC 3X8 NADH LF (GAUZE/BANDAGES/DRESSINGS) ×3 IMPLANT
DRSG PAD ABDOMINAL 8X10 ST (GAUZE/BANDAGES/DRESSINGS) ×3 IMPLANT
DURAPREP 26ML APPLICATOR (WOUND CARE) ×3 IMPLANT
ELECT REM PT RETURN 9FT ADLT (ELECTROSURGICAL) ×3
ELECTRODE REM PT RTRN 9FT ADLT (ELECTROSURGICAL) ×1 IMPLANT
EVACUATOR 1/8 PVC DRAIN (DRAIN) IMPLANT
GAUZE SPONGE 4X4 12PLY STRL (GAUZE/BANDAGES/DRESSINGS) ×3 IMPLANT
GLOVE BIOGEL PI IND STRL 8 (GLOVE) ×1 IMPLANT
GLOVE BIOGEL PI INDICATOR 8 (GLOVE) ×2
GLOVE ORTHO TXT STRL SZ7.5 (GLOVE) ×3 IMPLANT
GOWN STRL REUS W/ TWL LRG LVL3 (GOWN DISPOSABLE) ×2 IMPLANT
GOWN STRL REUS W/ TWL XL LVL3 (GOWN DISPOSABLE) ×1 IMPLANT
GOWN STRL REUS W/TWL LRG LVL3 (GOWN DISPOSABLE) ×6
GOWN STRL REUS W/TWL XL LVL3 (GOWN DISPOSABLE) ×3
GUIDEPIN 3.2X17.5 THRD DISP (PIN) ×4 IMPLANT
GUIDEWIRE BALL NOSE 80CM (WIRE) ×2 IMPLANT
HFN LH 130 DEG 11MM X 340MM (Nail) ×2 IMPLANT
HIP FRAC NAIL LAG SCR 10.5X100 (Orthopedic Implant) ×2 IMPLANT
KIT BASIN OR (CUSTOM PROCEDURE TRAY) ×3 IMPLANT
KIT ROOM TURNOVER OR (KITS) ×3 IMPLANT
LINER BOOT UNIVERSAL DISP (MISCELLANEOUS) ×3 IMPLANT
MANIFOLD NEPTUNE II (INSTRUMENTS) ×3 IMPLANT
NS IRRIG 1000ML POUR BTL (IV SOLUTION) ×3 IMPLANT
PACK GENERAL/GYN (CUSTOM PROCEDURE TRAY) ×3 IMPLANT
PAD ARMBOARD 7.5X6 YLW CONV (MISCELLANEOUS) ×6 IMPLANT
SCREW ANTI ROTATION 80MM (Screw) ×2 IMPLANT
SCREW CANN THRD AFF 10.5X100 (Orthopedic Implant) IMPLANT
SCREW DRILL BIT ANIT ROTATION (BIT) ×3 IMPLANT
SPONGE GAUZE 4X4 12PLY STER LF (GAUZE/BANDAGES/DRESSINGS) ×2 IMPLANT
STAPLER VISISTAT 35W (STAPLE) IMPLANT
SUT VIC AB 0 CT1 27 (SUTURE) ×3
SUT VIC AB 0 CT1 27XBRD ANBCTR (SUTURE) ×1 IMPLANT
SUT VIC AB 2-0 CT1 27 (SUTURE) ×6
SUT VIC AB 2-0 CT1 TAPERPNT 27 (SUTURE) ×2 IMPLANT
TAPE CLOTH SURG 4X10 WHT LF (GAUZE/BANDAGES/DRESSINGS) ×2 IMPLANT
WATER STERILE IRR 1000ML POUR (IV SOLUTION) ×6 IMPLANT

## 2014-11-28 NOTE — Op Note (Signed)
Test test test  Preop diagnosis: Left intertrochanteric fracture.  Postop diagnosis: Left intertrochanteric hip fracture.  Procedure: Affixes left trochanteric hip nail 34011 mm with the proximal interlock  Surgeon: Rodell Perna M.D.  Anesthesia: Gen. plus Marcaine skin local    Procedure: After induction general anesthesia preoperative Ancef prophylaxis timeout procedure patient was placed on the fracture table with distraction and fixation of the right lower extremity in the well leg holder left lower extremity was placed in traction internal rotation with the patella pointing toward the ceiling. C-arm was brought in reduction was performed AP and lateral fluoroscopic picture was checked and in the hip was prepped. Large shower curtain Betadine Steri-Drape was applied. The area was squared with towels Betadine Steri-Drape applied. Incision was made proximal trochanter tip the trochanter was checked. Starting Steinmann pin was placed under fluoroscopy overreamed passing of the 12 mm reamer measurement of the beaded tip ride and placement of a 340 nail 11 down to the level of the superior pole of the patella. There is tight fit in the midportion of the femur. Using the lateral guide pin was placed up the neck low on AP slightly anterior on the lateral. Drilled measuring placement of the screw and then the anti-derotation screw was then placed. Original screw was 100 anti-derotation was 80 mm. The compression screw was locked in for the rotation screw was placed. Entire apparatus was checked irrigated final spot pictures taken and then standard layer closure #1 Vicryl in the fascia 2-0 Vicryl subtendinous tissue skin staple closure Cindy [name] infiltration in a skin postop dressing and transferred recovery room. Signed Rodell Perna M.D.

## 2014-11-28 NOTE — H&P (Signed)
[facility] History and Physical  Cindy [name] YTK:[id number] DOB: 02-12-1922 DOA: 11/28/2014  Referring physician: Dr. Aline Brochure PCP: Elsie Stain, MD   Chief Complaint: left hip pain after fall  HPI: Cindy [name] is a 80 y.o. female  With complicated cardiac history including mitral valve regurgitation, CAD not amenable to PCI, CHF, PAF. Presented after an unwitnessed fall. Patient has had pain medication and is somewhat confused as such I am unable to confirm history with her. ED physician reports that patient was found after she was up. She had reported that she was sleepwalking although she had told personnel at nursing facility that she thought someone was knocking on the door.  While in the ED patient had x-ray of the left hip which reported left femoral neck fracture. Orthopedic surgeons consulted and we were consulted for medical evaluation recommendations   Review of Systems:  Unable to assess secondary to confusion  Past Medical History  Diagnosis Date  . Atrial fibrillation 04/23-24/2007    a. recurrent PAF with RVR in September 2013. b. Evaluated 03/2013, previously intolerant to Norpace and Amiodarone - consider Multaq if recurs. c. Not on anticoag due to history of falls and also some internal bleeding per son.  . Hypertrophic cardiomyopathy   . Urinary incontinence   . Diverticulosis of colon (without mention of hemorrhage) 2003/ 08/2000    EGD/colonoscopy Barretts esophagus//H.H divertics 08/2000  . Cervical mass     C2 lateral mass fracture  . Hypertension   . HLD (hyperlipidemia)     219/497  . Hypothyroidism   . Multinodular goiter (nontoxic)   . Osteoporosis   . Jaundice ~ 1935    "in grade school"  . Degenerative joint disease   . Depression   . Personal history of colonic polyps 02/29/2012    tubular adenoma  . Barrett's esophagus   . CAD (coronary artery disease)     a. NSTEMI 03/2013: 2/2 diagonal disease (small, not amenable to PCI,  for med rx).  . Moderate mitral regurgitation 2014  . Mild aortic stenosis 2014  . HOH (hard of hearing)   . Heart murmur   . Anemia, iron deficiency   . GERD (gastroesophageal reflux disease)   . Legally blind     "both eyes/Dr. Zigmund Daniel" (11/11/2014)  . Macular degeneration of both eyes   . Paroxysmal atrial fibrillation     a. not on AC due to high fall risk   Past Surgical History  Procedure Laterality Date  . Bladder surgery  1990's    bladder tack early 90's  . Tear duct probing  07/29/03    tear duct surg  . Cystourethroscopy  09/17/03  . Rotator cuff repair Bilateral ? date; 09/07/05    left; right( Dr. Gladstone Lighter)  . Appendectomy  1941  . Thyroid ultrasound  10/14/2003    MNG, no dominant masses  . Doppler echocardiography  03/05/2002&09/11/2003    ECHO, EF wnl, mild stenosis, A.S. mild MR, Mild T.R03/31/2003//ECHO EF 70%,LVH, ?diast dysfunction 09/11/2003  . Cataract extraction, bilateral  2003  . Dilation and curettage of uterus  09/07/2000    endometrial polyps removed, path all benign   . Tonsillectomy and adenoidectomy      "as a child"  . Fracture surgery  2010    right knee  . Knee arthroscopy Right   . Reduction mammaplasty  1981  . Incontinence surgery      "she's had a tack and a sling"  . Left heart catheterization with  coronary angiogram N/A 03/27/2013    Procedure: LEFT HEART CATHETERIZATION WITH CORONARY ANGIOGRAM;  Surgeon: Peter M Martinique, MD;  Location: [facility] CATH LAB;  Service: Cardiovascular;  Laterality: N/A;   Social History:  reports that she quit smoking about 40 years ago. Her smoking use included Cigarettes. She has a 2 pack-year smoking history. She has never used smokeless tobacco. She reports that she does not drink alcohol or use illicit drugs.  Allergies  Allergen Reactions  . Lipitor [Atorvastatin] Nausea Only and Other (See Comments)    LFT elevation  . Morphine And Related Other (See Comments)    "drives me crazy" and hyperactivity  .  Irbesartan Swelling  . Ramipril Swelling    REACTION: lips swelling  . Telmisartan-Hctz Other (See Comments)    REACTION: incontinence  . Amiodarone Other (See Comments)    'terrible headaches'  . Hydrochlorothiazide Other (See Comments)    hyponatremia  . Metoprolol Other (See Comments)    Headache, dizzy, "terribly sick"  . Other Other (See Comments)    ANTICOAGULANTS - not a candidate due to history of falls and bleeding  . Tikosyn [Dofetilide] Other (See Comments)    Not a candidate due to Prolonged QT  . Norpace [Disopyramide] Other (See Comments)    Dry mouth    Family History  Problem Relation Age of Onset  . Heart failure Mother     CHF, DM, HBP  . Hypertension Mother   . Uterine cancer Mother   . Stroke Mother   . Colon cancer Neg Hx   . Esophageal cancer Neg Hx   . Rectal cancer Neg Hx   . Stomach cancer Neg Hx      Prior to Admission medications   Medication Sig Start Date End Date Taking? Authorizing Provider  acetaminophen (TYLENOL) 325 MG tablet Take 2 tablets (650 mg total) by mouth every 6 (six) hours as needed for mild pain (or Fever >/= 101). 11/18/14  Yes Nita Sells, MD  amiodarone (PACERONE) 200 MG tablet Take 1 tablet (200 mg total) by mouth 2 (two) times daily. 11/18/14  Yes Nita Sells, MD  aspirin 81 MG chewable tablet Chew 1 tablet (81 mg total) by mouth daily. 03/29/13  Yes Dayna N Dunn, PA-C  diltiazem (CARDIZEM) 30 MG tablet Take 1 tablet (30 mg total) by mouth every 6 (six) hours as needed (rapid heart rate.). 09/18/14  Yes Almyra Deforest, PA  docusate sodium (COLACE) 100 MG capsule Take 100 mg by mouth daily as needed for mild constipation.   Yes Historical Provider, MD  ferrous fumarate (HEMOCYTE - 106 MG FE) 325 (106 FE) MG TABS tablet Take 1 tablet by mouth 2 (two) times daily.   Yes Historical Provider, MD  fluticasone (FLONASE) 50 MCG/ACT nasal spray Place 1 spray into both nostrils daily. 11/18/14  Yes Nita Sells, MD    furosemide (LASIX) 40 MG tablet Take 1 tablet (40 mg total) by mouth 2 (two) times daily. 11/18/14  Yes Nita Sells, MD  guaiFENesin-dextromethorphan (ROBITUSSIN DM) 100-10 MG/5ML syrup Take 5 mLs by mouth every 4 (four) hours as needed for cough. 11/18/14  Yes Nita Sells, MD  HYDROcodone-acetaminophen (NORCO/VICODIN) 5-325 MG per tablet Take 0.5-1 tablets by mouth 2 (two) times daily as needed for moderate pain.   Yes Historical Provider, MD  Hypromellose (ARTIFICIAL TEARS OP) Place 1 drop into both eyes 2 (two) times daily.   Yes Historical Provider, MD  ipratropium-albuterol (DUONEB) 0.5-2.5 (3) MG/3ML SOLN Take 3 mLs by nebulization  every 4 (four) hours as needed. Patient taking differently: Take 3 mLs by nebulization every 4 (four) hours as needed (bronchospasm).  11/18/14  Yes Nita Sells, MD  levothyroxine (SYNTHROID, LEVOTHROID) 25 MCG tablet Take 1 tablet (25 mcg total) by mouth every morning. 10/10/14  Yes Tonia Ghent, MD  losartan (COZAAR) 25 MG tablet Take 25 mg by mouth daily.   Yes Historical Provider, MD  metoprolol tartrate (LOPRESSOR) 25 MG tablet Take 12.5 mg by mouth 2 (two) times daily.   Yes Historical Provider, MD  mirtazapine (REMERON) 15 MG tablet Take 15 mg by mouth at bedtime.   Yes Historical Provider, MD  Multiple Vitamins-Minerals (HEALTHY EYES) TABS Take 1 tablet by mouth 2 (two) times daily.   Yes Historical Provider, MD  nystatin cream (MYCOSTATIN) Apply 1 application topically daily.   Yes Historical Provider, MD  omeprazole (PRILOSEC) 40 MG capsule Take 40 mg by mouth daily.   Yes Historical Provider, MD  zinc oxide (BALMEX) 11.3 % CREA cream Apply 1 application topically daily.   Yes Historical Provider, MD  zolpidem (AMBIEN) 5 MG tablet Take 1 tablet (5 mg total) by mouth at bedtime as needed for sleep. Patient taking differently: Take 5 mg by mouth at bedtime.  09/18/14  Yes Tonia Ghent, MD  metoprolol tartrate (LOPRESSOR) 12.5 mg  TABS tablet Take 0.5 tablets (12.5 mg total) by mouth 2 (two) times daily. Patient not taking: Reported on 11/28/2014 11/18/14   Nita Sells, MD  oxyCODONE (ROXICODONE INTENSOL) 20 MG/ML concentrated solution Administer 0.35ml every 2 hours as needed for respiratory distress/moderate to severe pain Patient not taking: Reported on 11/28/2014 11/27/14   Estill Dooms, MD   Physical Exam: Filed Vitals:   11/28/14 1100 11/28/14 1115 11/28/14 1130 11/28/14 1326  BP: 123/62 102/35 112/27 112/40  Pulse: 77 73 77 106  Temp:    98.6 F (37 C)  TempSrc:    Oral  Resp: 19 16 19 18   SpO2: 98% 96% 93% 94%    Wt Readings from Last 3 Encounters:  11/19/14 56.155 kg (123 lb 12.8 oz)  11/18/14 56.2 kg (123 lb 14.4 oz)  10/07/14 60.646 kg (133 lb 11.2 oz)    General: Patient is alert and awake Eyes: PERRL, normal lids, irises & conjunctiva ENT: grossly normal hearing, lips & tongue Neck: no LAD, masses or thyromegaly Cardiovascular: RRR, + murmur systolic grade 3-4 Respiratory: CTA bilaterally, no w/r/r. Normal respiratory effort. Abdomen: soft, nt, nd Skin: no rash or induration seen on limited exam Musculoskeletal: grossly normal tone BUE/BLE Psychiatric: Unable to accurately assess secondary to confusion after pain medication Neurologic: No facial asymmetry           Labs on Admission:  Basic Metabolic Panel:  Recent Labs Lab 11/28/14 0919  NA 134*  K 4.4  CL 98  CO2 28  GLUCOSE 109*  BUN 13  CREATININE 1.01  CALCIUM 8.7   Liver Function Tests:  Recent Labs Lab 11/28/14 0919  AST 25  ALT 19  ALKPHOS 58  BILITOT 0.6  PROT 5.5*  ALBUMIN 3.1*   No results for input(s): LIPASE, AMYLASE in the last 168 hours. No results for input(s): AMMONIA in the last 168 hours. CBC:  Recent Labs Lab 11/28/14 0919  WBC 7.1  NEUTROABS 4.9  HGB 9.0*  HCT 31.9*  MCV 76.5*  PLT 223   Cardiac Enzymes:  Recent Labs Lab 11/28/14 0902  TROPONINI 0.06*    BNP (last  3 results)  Recent Labs  08/19/14 1117 09/16/14 1339 11/11/14 1028  PROBNP 1807.0* 2382.0* 2417.0*   CBG: No results for input(s): GLUCAP in the last 168 hours.  Radiological Exams on Admission: Dg Chest 1 View  11/28/2014   CLINICAL DATA:  Fall today.  Initial encounter.  EXAM: CHEST - 1 VIEW  COMPARISON:  11/16/2014 and 11/13/2014 radiographs. Chest CT 06/28/2010.  FINDINGS: 0928 hr. There is stable cardiomegaly, aortic atherosclerosis and retrocardiac density consistent with a hiatal hernia. There is chronic vascular congestion and left lower lobe scarring. No edema, confluent airspace opacity or pleural effusion is seen. Old rib fractures are present on the left. There is a mild thoracolumbar scoliosis. Postsurgical changes are present at both shoulders with chronic narrowing of the subacromial space on the right. No acute osseous findings evident.  IMPRESSION: No acute cardiopulmonary process. Stable cardiomegaly and chronic vascular congestion.   Electronically Signed   By: Camie Patience M.D.   On: 11/28/2014 10:05   Dg Elbow Complete Right  11/28/2014   CLINICAL DATA:  Fall today with posterior elbow bruising. Initial encounter.  EXAM: RIGHT ELBOW - COMPLETE 3+ VIEW  COMPARISON:  None.  FINDINGS: The bones appear mildly demineralized. There is no evidence acute fracture, dislocation or elbow joint effusion. There are mild degenerative changes with mild spurring of the coronoid process and lateral humeral epicondyle. There is mild dorsal soft tissue swelling. No foreign bodies are demonstrated.  IMPRESSION: No acute osseous findings. Osteopenia and mild degenerative changes noted.   Electronically Signed   By: Camie Patience M.D.   On: 11/28/2014 10:08   Dg Hip Complete Left  11/28/2014   CLINICAL DATA:  Fall today.  Left hip pain.  Initial encounter  EXAM: LEFT HIP - COMPLETE 2+ VIEW  COMPARISON:  None.  FINDINGS: Acute fracture of the lower femoral neck on the left with displacement and  angulation. Hip joint space is normal. No other fracture in the pelvis. Right hip joint is normal.  IMPRESSION: Left femoral neck fracture.   Electronically Signed   By: Franchot Gallo M.D.   On: 11/28/2014 10:06    EKG: Independently reviewed. Sinus rhythm with no new ST elevation or depression  Assessment/Plan Active Problems:   Closed left hip fracture - Orthopedic surgeon consulted - I will consult cardiology for cardiac clearance prior to planned procedure - Supportive therapy, we'll avoid morphine given history of allergy. Patient is sensitive to opioids as such will try to minimize opiate therapy as much as possible    Essential hypertension - We'll continue beta blocker and hold ARB today    CAD (coronary artery disease) - Continue statin and aspirin. No chest pain reported    PAF (paroxysmal atrial fibrillation) - Rate controlled currently will continue - Not anticoagulation candidate given falls    Chronic diastolic CHF (congestive heart failure) - compensated. Continue current home regimen    Pulmonary hypertension - stable   Code Status: DNR DVT Prophylaxis: heparin Family Communication: discussed with son (POA) Disposition Plan: Telemetry  Time spent: > 55 minutes  Velvet Bathe [facility] Pager [phone]

## 2014-11-28 NOTE — ED Notes (Signed)
Placed on  bedpan

## 2014-11-28 NOTE — Brief Op Note (Signed)
11/28/2014  5:30 PM  PATIENT:  [name]  80 y.o. female  PRE-OPERATIVE DIAGNOSIS:  Left Hip Fracture  POST-OPERATIVE DIAGNOSIS:  left hip fracture  PROCEDURE:  Procedure(s): INTRAMEDULLARY  NAIL Left FEMORAL (Left)  biomet 340 X 11 Affixis nail with interlock  SURGEON:  Surgeon(s) and Role:    * Marybelle Killings, MD - Primary  PHYSICIAN ASSISTANT:   ASSISTANTS: none   ANESTHESIA:   local and general  EBL:  Total I/O In: 400 [I.V.:300; Other:100] Out: 700 [Urine:600; Blood:100]  BLOOD ADMINISTERED:none  DRAINS: none   LOCAL MEDICATIONS USED:  MARCAINE     SPECIMEN:  No Specimen  DISPOSITION OF SPECIMEN:  N/A  COUNTS:  YES  TOURNIQUET:  * No tourniquets in log *  DICTATION: .Other Dictation: Dictation Number [id number]  PLAN OF CARE: already inpt  PATIENT DISPOSITION:  PACU - hemodynamically stable.   Delay start of Pharmacological VTE agent (>24hrs) due to surgical blood loss or risk of bleeding: yes

## 2014-11-28 NOTE — ED Notes (Signed)
Patients son at bedside.  MD Aline Brochure at bedside.

## 2014-11-28 NOTE — Anesthesia Preprocedure Evaluation (Addendum)
Anesthesia Evaluation  Patient identified by MRN, date of birth, ID band Patient awake    Reviewed: Allergy & Precautions, H&P , NPO status , Patient's Chart, lab work & pertinent test results, reviewed documented beta blocker date and time   Airway Mallampati: III  TM Distance: >3 FB Neck ROM: Full    Dental  (+) Teeth Intact, Dental Advisory Given   Pulmonary former smoker,          Cardiovascular hypertension, Pt. on medications + CAD, + Past MI and +CHF + dysrhythmias Atrial Fibrillation + Valvular Problems/Murmurs     Neuro/Psych  Headaches, Depression  Neuromuscular disease    GI/Hepatic GERD-  Medicated and Controlled,  Endo/Other  Hypothyroidism   Renal/GU      Musculoskeletal  (+) Arthritis -,   Abdominal   Peds  Hematology  (+) anemia ,   Anesthesia Other Findings   Reproductive/Obstetrics                            Anesthesia Physical Anesthesia Plan  ASA: IV  Anesthesia Plan: General   Post-op Pain Management:    Induction: Intravenous  Airway Management Planned: Oral ETT  Additional Equipment:   Intra-op Plan:   Post-operative Plan: Extubation in OR  Informed Consent: I have reviewed the patients History and Physical, chart, labs and discussed the procedure including the risks, benefits and alternatives for the proposed anesthesia with the patient or authorized representative who has indicated his/her understanding and acceptance.     Plan Discussed with: CRNA, Anesthesiologist and Surgeon  Anesthesia Plan Comments:         Anesthesia Quick Evaluation

## 2014-11-28 NOTE — Anesthesia Procedure Notes (Signed)
Procedure Name: Intubation Date/Time: 11/28/2014 4:10 PM Performed by: Marinda Elk A Pre-anesthesia Checklist: Patient identified, Timeout performed, Emergency Drugs available, Suction available and Patient being monitored Patient Re-evaluated:Patient Re-evaluated prior to inductionOxygen Delivery Method: Circle system utilized Preoxygenation: Pre-oxygenation with 100% oxygen Intubation Type: IV induction Ventilation: Mask ventilation without difficulty Laryngoscope Size: Mac and 3 Grade View: Grade III Tube type: Oral Tube size: 7.5 mm Number of attempts: 1 Airway Equipment and Method: Stylet Placement Confirmation: ETT inserted through vocal cords under direct vision,  breath sounds checked- equal and bilateral and positive ETCO2 Secured at: 20 cm Tube secured with: Tape Dental Injury: Teeth and Oropharynx as per pre-operative assessment

## 2014-11-28 NOTE — Transfer of Care (Signed)
Immediate Anesthesia Transfer of Care Note  Patient: Cindy [name]  Procedure(s) Performed: Procedure(s): INTRAMEDULLARY  NAIL Left FEMORAL (Left)  Patient Location: PACU  Anesthesia Type:General  Level of Consciousness: sedated  Airway & Oxygen Therapy: Patient Spontanous Breathing and Patient connected to nasal cannula oxygen  Post-op Assessment: Report given to PACU RN and Post -op Vital signs reviewed and stable  Post vital signs: Reviewed and stable  Complications: No apparent anesthesia complications

## 2014-11-28 NOTE — Consult Note (Signed)
Reason for Consult: left hip fracture Referring Physician: Wendee Beavers Md  Cindy [name] is an 80 y.o. female.  HPI: 80 yo female  With recent admission for heart failure and pneumonia was at [facility] place when she fell and suffered left hip Fx.  Basicervical with intertroch extension of fracture.   Past Medical History  Diagnosis Date  . Atrial fibrillation 04/23-24/2007    a. recurrent PAF with RVR in September 2013. b. Evaluated 03/2013, previously intolerant to Norpace and Amiodarone - consider Multaq if recurs. c. Not on anticoag due to history of falls and also some internal bleeding per son.  . Hypertrophic cardiomyopathy   . Urinary incontinence   . Diverticulosis of colon (without mention of hemorrhage) 2003/ 08/2000    EGD/colonoscopy Barretts esophagus//H.H divertics 08/2000  . Cervical mass     C2 lateral mass fracture  . Hypertension   . HLD (hyperlipidemia)     219/497  . Hypothyroidism   . Multinodular goiter (nontoxic)   . Osteoporosis   . Jaundice ~ 1935    "in grade school"  . Degenerative joint disease   . Depression   . Personal history of colonic polyps 02/29/2012    tubular adenoma  . Barrett's esophagus   . CAD (coronary artery disease)     a. NSTEMI 03/2013: 2/2 diagonal disease (small, not amenable to PCI, for med rx).  . Moderate mitral regurgitation 2014  . Mild aortic stenosis 2014  . HOH (hard of hearing)   . Heart murmur   . Anemia, iron deficiency   . GERD (gastroesophageal reflux disease)   . Legally blind     "both eyes/Dr. Zigmund Daniel" (11/11/2014)  . Macular degeneration of both eyes   . Paroxysmal atrial fibrillation     a. not on AC due to high fall risk    Past Surgical History  Procedure Laterality Date  . Bladder surgery  1990's    bladder tack early 90's  . Tear duct probing  07/29/03    tear duct surg  . Cystourethroscopy  09/17/03  . Rotator cuff repair Bilateral ? date; 09/07/05    left; right( Dr. Gladstone Lighter)  . Appendectomy  1941  .  Thyroid ultrasound  10/14/2003    MNG, no dominant masses  . Doppler echocardiography  03/05/2002&09/11/2003    ECHO, EF wnl, mild stenosis, A.S. mild MR, Mild T.R03/31/2003//ECHO EF 70%,LVH, ?diast dysfunction 09/11/2003  . Cataract extraction, bilateral  2003  . Dilation and curettage of uterus  09/07/2000    endometrial polyps removed, path all benign   . Tonsillectomy and adenoidectomy      "as a child"  . Fracture surgery  2010    right knee  . Knee arthroscopy Right   . Reduction mammaplasty  1981  . Incontinence surgery      "she's had a tack and a sling"  . Left heart catheterization with coronary angiogram N/A 03/27/2013    Procedure: LEFT HEART CATHETERIZATION WITH CORONARY ANGIOGRAM;  Surgeon: Peter M Martinique, MD;  Location: [facility] CATH LAB;  Service: Cardiovascular;  Laterality: N/A;    Family History  Problem Relation Age of Onset  . Heart failure Mother     CHF, DM, HBP  . Hypertension Mother   . Uterine cancer Mother   . Stroke Mother   . Colon cancer Neg Hx   . Esophageal cancer Neg Hx   . Rectal cancer Neg Hx   . Stomach cancer Neg Hx     Social History:  reports that she quit smoking about 40 years ago. Her smoking use included Cigarettes. She has a 2 pack-year smoking history. She has never used smokeless tobacco. She reports that she does not drink alcohol or use illicit drugs.  Allergies:  Allergies  Allergen Reactions  . Lipitor [Atorvastatin] Nausea Only and Other (See Comments)    LFT elevation  . Morphine And Related Other (See Comments)    "drives me crazy" and hyperactivity  . Irbesartan Swelling  . Ramipril Swelling    REACTION: lips swelling  . Telmisartan-Hctz Other (See Comments)    REACTION: incontinence  . Amiodarone Other (See Comments)    'terrible headaches'  . Hydrochlorothiazide Other (See Comments)    hyponatremia  . Metoprolol Other (See Comments)    Headache, dizzy, "terribly sick"  . Other Other (See Comments)    ANTICOAGULANTS -  not a candidate due to history of falls and bleeding  . Tikosyn [Dofetilide] Other (See Comments)    Not a candidate due to Prolonged QT  . Norpace [Disopyramide] Other (See Comments)    Dry mouth    Medications: I have reviewed the patient's current medications.  Results for orders placed or performed during the hospital encounter of 11/28/14 (from the past 48 hour(s))  Troponin I     Status: Abnormal   Collection Time: 11/28/14  9:02 AM  Result Value Ref Range   Troponin I 0.06 (H) <0.031 ng/mL    Comment:        PERSISTENTLY INCREASED TROPONIN VALUES IN THE RANGE OF 0.04-0.49 ng/mL CAN BE SEEN IN:       -UNSTABLE ANGINA       -CONGESTIVE HEART FAILURE       -MYOCARDITIS       -CHEST TRAUMA       -ARRYHTHMIAS       -LATE PRESENTING MYOCARDIAL INFARCTION       -COPD   CLINICAL FOLLOW-UP RECOMMENDED. Please note change in reference range.   Brain natriuretic peptide     Status: Abnormal   Collection Time: 11/28/14  9:19 AM  Result Value Ref Range   B Natriuretic Peptide 577.8 (H) 0.0 - 100.0 pg/mL    Comment: Please note change in reference range.  CBC with Differential     Status: Abnormal   Collection Time: 11/28/14  9:19 AM  Result Value Ref Range   WBC 7.1 4.0 - 10.5 K/uL   RBC 4.17 3.87 - 5.11 MIL/uL   Hemoglobin 9.0 (L) 12.0 - 15.0 g/dL   HCT 31.9 (L) 36.0 - 46.0 %   MCV 76.5 (L) 78.0 - 100.0 fL   MCH 21.6 (L) 26.0 - 34.0 pg   MCHC 28.2 (L) 30.0 - 36.0 g/dL   RDW 17.7 (H) 11.5 - 15.5 %   Platelets 223 150 - 400 K/uL   Neutrophils Relative % 68 43 - 77 %   Neutro Abs 4.9 1.7 - 7.7 K/uL   Lymphocytes Relative 20 12 - 46 %   Lymphs Abs 1.4 0.7 - 4.0 K/uL   Monocytes Relative 10 3 - 12 %   Monocytes Absolute 0.7 0.1 - 1.0 K/uL   Eosinophils Relative 2 0 - 5 %   Eosinophils Absolute 0.1 0.0 - 0.7 K/uL   Basophils Relative 0 0 - 1 %   Basophils Absolute 0.0 0.0 - 0.1 K/uL  Comprehensive metabolic panel     Status: Abnormal   Collection Time: 11/28/14  9:19 AM   Result Value Ref Range  Sodium 134 (L) 135 - 145 mmol/L    Comment: Please note change in reference range.   Potassium 4.4 3.5 - 5.1 mmol/L    Comment: Please note change in reference range.   Chloride 98 96 - 112 mEq/L   CO2 28 19 - 32 mmol/L   Glucose, Bld 109 (H) 70 - 99 mg/dL   BUN 13 6 - 23 mg/dL   Creatinine, Ser 1.01 0.50 - 1.10 mg/dL   Calcium 8.7 8.4 - 10.5 mg/dL   Total Protein 5.5 (L) 6.0 - 8.3 g/dL   Albumin 3.1 (L) 3.5 - 5.2 g/dL   AST 25 0 - 37 U/L   ALT 19 0 - 35 U/L   Alkaline Phosphatase 58 39 - 117 U/L   Total Bilirubin 0.6 0.3 - 1.2 mg/dL   GFR calc non Af Amer 48 (L) >90 mL/min   GFR calc Af Amer 55 (L) >90 mL/min    Comment: (NOTE) The eGFR has been calculated using the CKD EPI equation. This calculation has not been validated in all clinical situations. eGFR's persistently <90 mL/min signify possible Chronic Kidney Disease.    Anion gap 8 5 - 15    Dg Chest 1 View  11/28/2014   CLINICAL DATA:  Fall today.  Initial encounter.  EXAM: CHEST - 1 VIEW  COMPARISON:  11/16/2014 and 11/13/2014 radiographs. Chest CT 06/28/2010.  FINDINGS: 0928 hr. There is stable cardiomegaly, aortic atherosclerosis and retrocardiac density consistent with a hiatal hernia. There is chronic vascular congestion and left lower lobe scarring. No edema, confluent airspace opacity or pleural effusion is seen. Old rib fractures are present on the left. There is a mild thoracolumbar scoliosis. Postsurgical changes are present at both shoulders with chronic narrowing of the subacromial space on the right. No acute osseous findings evident.  IMPRESSION: No acute cardiopulmonary process. Stable cardiomegaly and chronic vascular congestion.   Electronically Signed   By: Camie Patience M.D.   On: 11/28/2014 10:05   Dg Elbow Complete Right  11/28/2014   CLINICAL DATA:  Fall today with posterior elbow bruising. Initial encounter.  EXAM: RIGHT ELBOW - COMPLETE 3+ VIEW  COMPARISON:  None.  FINDINGS:  The bones appear mildly demineralized. There is no evidence acute fracture, dislocation or elbow joint effusion. There are mild degenerative changes with mild spurring of the coronoid process and lateral humeral epicondyle. There is mild dorsal soft tissue swelling. No foreign bodies are demonstrated.  IMPRESSION: No acute osseous findings. Osteopenia and mild degenerative changes noted.   Electronically Signed   By: Camie Patience M.D.   On: 11/28/2014 10:08   Dg Hip Complete Left  11/28/2014   CLINICAL DATA:  Fall today.  Left hip pain.  Initial encounter  EXAM: LEFT HIP - COMPLETE 2+ VIEW  COMPARISON:  None.  FINDINGS: Acute fracture of the lower femoral neck on the left with displacement and angulation. Hip joint space is normal. No other fracture in the pelvis. Right hip joint is normal.  IMPRESSION: Left femoral neck fracture.   Electronically Signed   By: Franchot Gallo M.D.   On: 11/28/2014 10:06    Review of Systems  Constitutional: Negative for fever and chills.  Respiratory:       Pneumonia a few weeks ago resolved.   Cardiovascular:       CHF  Gastrointestinal: Negative for heartburn.  Psychiatric/Behavioral: Negative for depression.   Blood pressure 112/40, pulse 106, temperature 98.6 F (37 C), temperature source Oral, resp. rate  18, SpO2 94 %. Physical Exam  Constitutional: She appears well-developed.  HENT:  Head: Normocephalic.  Eyes: Pupils are equal, round, and reactive to light.  Neck: Normal range of motion. Neck supple.  Cardiovascular: Normal rate.   Respiratory: Effort normal.  GI: Soft. Bowel sounds are normal.  Musculoskeletal:  Left LE short and ext rotated  Skin: Skin is warm and dry.  Psychiatric: She has a normal mood and affect. Her behavior is normal.  Mildly consused, answers ?s appropriately    Assessment/Plan: Left IT hip fracture for troch nail. Sone at bedside POA.   Plan troch nail.  Risks discussed.   Danaja Lasota C 11/28/2014, 2:51 PM

## 2014-11-28 NOTE — ED Notes (Signed)
PTAR - Patient coming from facility after having a fall today 11/28/14 at 03:00am.  Patient was given 5mg  Oxycodone at 07:58am and EMS was phoned at that time.  Patient fell out of the bed.  Patient has left leg outward rotation and shorting of the leg.  Patient fell on to a hard surface floor at the facility per PTAR.  Patient states pain is 10/10 when asked but currently sleeping.

## 2014-11-28 NOTE — ED Notes (Signed)
Pt in excruciating pain.  Admitting and EDP notified.

## 2014-11-28 NOTE — ED Notes (Signed)
PT became very anxious and was not responding appropriately to verbal stimuli immediately after fentanyl given.  Pt was gasping for air even after O2 sats returned to normal (sats briefly dropped to low 80's - O2 placed).

## 2014-11-28 NOTE — ED Provider Notes (Signed)
CSN: [id number]     Arrival date & time 11/28/14  0258 History   First MD Initiated Contact with Patient 11/28/14 [phone]     Chief Complaint  Patient presents with  . Fall     (Consider location/radiation/quality/duration/timing/severity/associated sxs/prior Treatment) Patient is a 80 y.o. female presenting with fall. The history is provided by the patient and the EMS personnel.  Fall This is a new problem. The current episode started 3 to 5 hours ago. Episode frequency: once. The problem has been resolved. Pertinent negatives include no chest pain, no abdominal pain, no headaches and no shortness of breath. Nothing aggravates the symptoms. Nothing relieves the symptoms. She has tried nothing for the symptoms. The treatment provided no relief.    Past Medical History  Diagnosis Date  . Atrial fibrillation 04/23-24/2007    a. recurrent PAF with RVR in September 2013. b. Evaluated 03/2013, previously intolerant to Norpace and Amiodarone - consider Multaq if recurs. c. Not on anticoag due to history of falls and also some internal bleeding per son.  . Hypertrophic cardiomyopathy   . Urinary incontinence   . Diverticulosis of colon (without mention of hemorrhage) 2003/ 08/2000    EGD/colonoscopy Barretts esophagus//H.H divertics 08/2000  . Cervical mass     C2 lateral mass fracture  . Hypertension   . Hypercholesterolemia     219/497  . Hypothyroidism   . Multinodular goiter (nontoxic)   . Osteoporosis   . Blood transfusion     "my blood count was a little low"  . Jaundice ~ 1935    "in grade school"  . Degenerative joint disease     back  . Depression     "husband died 02-21-2011"  . Personal history of colonic polyps 02/29/2012    tubular adenoma  . Barrett's esophagus   . CAD (coronary artery disease)     a. NSTEMI 03/2013: secondary to diagonal disease (small, not amenable to PCI, for med rx).  . Moderate mitral regurgitation 2014  . Mild aortic stenosis 2014  . Lung nodule  seen on imaging study, pt does not wish further work up 02/08/2014  . Mitral valve regurgitation, mod to severe with moderately calcified annulus 02/08/2014  . Mitral valve mass, density - no fevers to suggest endocarditis 02/08/2014  . HOH (hard of hearing)   . Shortness of breath   . CHF (congestive heart failure)   . Heart murmur   . Anemia, iron deficiency   . GERD (gastroesophageal reflux disease)   . Legally blind     "both eyes/Dr. Zigmund Daniel" (11/11/2014)  . Macular degeneration of both eyes    Past Surgical History  Procedure Laterality Date  . Bladder surgery  1990's    bladder tack early 90's  . Tear duct probing  07/29/03    tear duct surg  . Cystourethroscopy  09/17/03  . Rotator cuff repair Bilateral ? date; 09/07/05    left; right( Dr. Gladstone Lighter)  . Appendectomy  1941  . Thyroid ultrasound  10/14/2003    MNG, no dominant masses  . Doppler echocardiography  03/05/2002&09/11/2003    ECHO, EF wnl, mild stenosis, A.S. mild MR, Mild T.R03/31/2003//ECHO EF 70%,LVH, ?diast dysfunction 09/11/2003  . Cataract extraction, bilateral  2003  . Dilation and curettage of uterus  09/07/2000    endometrial polyps removed, path all benign   . Tonsillectomy and adenoidectomy      "as a child"  . Fracture surgery  2010    right knee  . Knee  arthroscopy Right   . Reduction mammaplasty  1981  . Incontinence surgery      "she's had a tack and a sling"  . Left heart catheterization with coronary angiogram N/A 03/27/2013    Procedure: LEFT HEART CATHETERIZATION WITH CORONARY ANGIOGRAM;  Surgeon: Peter M Martinique, MD;  Location: [facility] CATH LAB;  Service: Cardiovascular;  Laterality: N/A;   Family History  Problem Relation Age of Onset  . Heart failure Mother     CHF, DM, HBP  . Hypertension Mother   . Uterine cancer Mother   . Stroke Mother   . Colon cancer Neg Hx   . Esophageal cancer Neg Hx   . Rectal cancer Neg Hx   . Stomach cancer Neg Hx    History  Substance Use Topics  . Smoking  status: Former Smoker -- 0.50 packs/day for 4 years    Types: Cigarettes    Quit date: 07/06/1974  . Smokeless tobacco: Never Used  . Alcohol Use: No   OB History    No data available     Review of Systems  Constitutional: Negative for fever and fatigue.  HENT: Negative for congestion and drooling.   Eyes: Negative for pain.  Respiratory: Negative for cough and shortness of breath.   Cardiovascular: Negative for chest pain.  Gastrointestinal: Negative for nausea, vomiting, abdominal pain and diarrhea.  Genitourinary: Negative for dysuria and hematuria.  Musculoskeletal: Negative for back pain, gait problem and neck pain.  Skin: Negative for color change.  Neurological: Negative for dizziness and headaches.  Hematological: Negative for adenopathy.  Psychiatric/Behavioral: Negative for behavioral problems.  All other systems reviewed and are negative.     Allergies  Lipitor; Morphine and related; Irbesartan; Ramipril; Telmisartan-hctz; Amiodarone; Hydrochlorothiazide; Metoprolol; Other; Tikosyn; and Norpace  Home Medications   Prior to Admission medications   Medication Sig Start Date End Date Taking? Authorizing Provider  acetaminophen (TYLENOL) 325 MG tablet Take 2 tablets (650 mg total) by mouth every 6 (six) hours as needed for mild pain (or Fever >/= 101). 11/18/14   Nita Sells, MD  amiodarone (PACERONE) 200 MG tablet Take 1 tablet (200 mg total) by mouth 2 (two) times daily. 11/18/14   Nita Sells, MD  aspirin 81 MG chewable tablet Chew 1 tablet (81 mg total) by mouth daily. 03/29/13   Dayna N Dunn, PA-C  BESIVANCE 0.6 % SUSP Place 1 drop into both eyes 2 (two) times daily.  12/21/12   Historical Provider, MD  diltiazem (CARDIZEM) 30 MG tablet Take 1 tablet (30 mg total) by mouth every 6 (six) hours as needed (rapid heart rate.). 09/18/14   Almyra Deforest, PA  docusate sodium (COLACE) 100 MG capsule Take 100 mg by mouth daily as needed for mild constipation.     Historical Provider, MD  fluticasone (FLONASE) 50 MCG/ACT nasal spray Place 1 spray into both nostrils daily. 11/18/14   Nita Sells, MD  furosemide (LASIX) 40 MG tablet Take 1 tablet (40 mg total) by mouth 2 (two) times daily. 11/18/14   Nita Sells, MD  guaiFENesin-dextromethorphan (ROBITUSSIN DM) 100-10 MG/5ML syrup Take 5 mLs by mouth every 4 (four) hours as needed for cough. 11/18/14   Nita Sells, MD  HYDROcodone-acetaminophen (NORCO/VICODIN) 5-325 MG per tablet Take 0.5-1 tablets by mouth 2 (two) times daily as needed for moderate pain.    Historical Provider, MD  ipratropium-albuterol (DUONEB) 0.5-2.5 (3) MG/3ML SOLN Take 3 mLs by nebulization every 4 (four) hours as needed. 11/18/14   Nita Sells, MD  levothyroxine (SYNTHROID, LEVOTHROID) 25 MCG tablet Take 1 tablet (25 mcg total) by mouth every morning. 10/10/14   Tonia Ghent, MD  losartan (COZAAR) 50 MG tablet Take 50 mg by mouth daily.  09/04/14   Historical Provider, MD  metoprolol tartrate (LOPRESSOR) 12.5 mg TABS tablet Take 0.5 tablets (12.5 mg total) by mouth 2 (two) times daily. 11/18/14   Nita Sells, MD  Multiple Vitamins-Minerals (OCUVITE PRESERVISION) TABS Take 1 tablet by mouth 2 (two) times daily.     Historical Provider, MD  omeprazole (PRILOSEC) 40 MG capsule Take 40 mg by mouth daily.    Historical Provider, MD  oxyCODONE (ROXICODONE INTENSOL) 20 MG/ML concentrated solution Administer 0.66ml every 2 hours as needed for respiratory distress/moderate to severe pain 11/27/14   Estill Dooms, MD  zolpidem (AMBIEN) 5 MG tablet Take 1 tablet (5 mg total) by mouth at bedtime as needed for sleep. 09/18/14   Tonia Ghent, MD   There were no vitals taken for this visit. Physical Exam  Constitutional: She is oriented to person, place, and time. She appears well-developed and well-nourished.  HENT:  Head: Normocephalic and atraumatic.  Mouth/Throat: Oropharynx is clear and moist. No  oropharyngeal exudate.  Eyes: Conjunctivae and EOM are normal. Pupils are equal, round, and reactive to light.  Neck: Normal range of motion. Neck supple.  No vertebral tenderness to palpation.  Cardiovascular: Normal rate, regular rhythm, normal heart sounds and intact distal pulses.  Exam reveals no gallop and no friction rub.   No murmur heard. Pulmonary/Chest: Effort normal and breath sounds normal. No respiratory distress. She has no wheezes.  Abdominal: Soft. Bowel sounds are normal. There is no tenderness. There is no rebound and no guarding.  Musculoskeletal: Normal range of motion. She exhibits tenderness. She exhibits no edema.  Normal sensation diffusely.  2+ distal pulses in bilateral lower extremities.  Left lower extremity is rotated laterally. Mild to moderate tenderness to palpation of the left lateral hip. Normal range of motion of the right hip.  Normal strength in upper extremities and right lower extremity. Deferred strength testing in left lower extremity due to pain.  Old appearing ecchymosis and abrasion to the right posterior elbow which the patient states is not new.  Neurological: She is alert and oriented to person, place, and time.  Skin: Skin is warm and dry.  Psychiatric: She has a normal mood and affect. Her behavior is normal.  Nursing note and vitals reviewed.   ED Course  Procedures (including critical care time) Labs Review Labs Reviewed  BRAIN NATRIURETIC PEPTIDE - Abnormal; Notable for the following:    B Natriuretic Peptide 577.8 (*)    All other components within normal limits  CBC WITH DIFFERENTIAL - Abnormal; Notable for the following:    Hemoglobin 9.0 (*)    HCT 31.9 (*)    MCV 76.5 (*)    MCH 21.6 (*)    MCHC 28.2 (*)    RDW 17.7 (*)    All other components within normal limits  COMPREHENSIVE METABOLIC PANEL - Abnormal; Notable for the following:    Sodium 134 (*)    Glucose, Bld 109 (*)    Total Protein 5.5 (*)    Albumin 3.1 (*)     GFR calc non Af Amer 48 (*)    GFR calc Af Amer 55 (*)    All other components within normal limits  TROPONIN I - Abnormal; Notable for the following:    Troponin I 0.06 (*)  All other components within normal limits  URINALYSIS, ROUTINE W REFLEX MICROSCOPIC    Imaging Review Dg Chest 1 View  11/28/2014   CLINICAL DATA:  Fall today.  Initial encounter.  EXAM: CHEST - 1 VIEW  COMPARISON:  11/16/2014 and 11/13/2014 radiographs. Chest CT 06/28/2010.  FINDINGS: 0928 hr. There is stable cardiomegaly, aortic atherosclerosis and retrocardiac density consistent with a hiatal hernia. There is chronic vascular congestion and left lower lobe scarring. No edema, confluent airspace opacity or pleural effusion is seen. Old rib fractures are present on the left. There is a mild thoracolumbar scoliosis. Postsurgical changes are present at both shoulders with chronic narrowing of the subacromial space on the right. No acute osseous findings evident.  IMPRESSION: No acute cardiopulmonary process. Stable cardiomegaly and chronic vascular congestion.   Electronically Signed   By: Camie Patience M.D.   On: 11/28/2014 10:05   Dg Elbow Complete Right  11/28/2014   CLINICAL DATA:  Fall today with posterior elbow bruising. Initial encounter.  EXAM: RIGHT ELBOW - COMPLETE 3+ VIEW  COMPARISON:  None.  FINDINGS: The bones appear mildly demineralized. There is no evidence acute fracture, dislocation or elbow joint effusion. There are mild degenerative changes with mild spurring of the coronoid process and lateral humeral epicondyle. There is mild dorsal soft tissue swelling. No foreign bodies are demonstrated.  IMPRESSION: No acute osseous findings. Osteopenia and mild degenerative changes noted.   Electronically Signed   By: Camie Patience M.D.   On: 11/28/2014 10:08   Dg Hip Complete Left  11/28/2014   CLINICAL DATA:  Fall today.  Left hip pain.  Initial encounter  EXAM: LEFT HIP - COMPLETE 2+ VIEW  COMPARISON:  None.   FINDINGS: Acute fracture of the lower femoral neck on the left with displacement and angulation. Hip joint space is normal. No other fracture in the pelvis. Right hip joint is normal.  IMPRESSION: Left femoral neck fracture.   Electronically Signed   By: Franchot Gallo M.D.   On: 11/28/2014 10:06     EKG Interpretation   Date/Time:  Thursday November 28 2014 08:52:46 EST Ventricular Rate:  64 PR Interval:  171 QRS Duration: 116 QT Interval:  456 QTC Calculation: 470 R Axis:   -31 Text Interpretation:  Sinus rhythm LVH with secondary repolarization  abnormality Anterior infarct, old Confirmed by Elzada Pytel  MD, Jaidy Cottam  ([id number]) on 11/28/2014 9:05:14 AM      MDM   Final diagnoses:  Fall  Closed left hip fracture, initial encounter    9:00 AM 81 y.o. female w hx of afib, CAD who presents with a mechanical fall which occurred around 3 AM this morning at her facility. She believes she was sleepwalking. They reportedly gave her an oxycodone at her facility. She currently complains of 10 out of 10 left hip pain. She denies hitting her head or loss of consciousness. She denies any headache or neck pain. She currently only complains of left hip pain. She is alert and oriented 3 here. Suspected left hip fracture. We'll get screening labs and imaging. Of note the patient was recently admitted to the hospital for mild heart failure and A. fib with RVR.  Patient found to have left hip fracture. Case discussed with Dr. Lorin Mercy from orthopedics. Will admit to the hospitalist.   Pamella Pert, MD 11/28/14 2020

## 2014-11-28 NOTE — Consult Note (Signed)
CARDIOLOGY CONSULT NOTE   Patient ID: THEONE BOWELL MRN: [id number] DOB/AGE: [birth year] 80 y.o.  Admit date: 11/28/2014  Primary Physician   Elsie Stain, MD Primary Cardiologist   Dr. Martinique Reason for Consultation  Pre-operative clearance- hip frxr  HPI: Cindy [name] is a 80 y.o. female with a history of HOCM, CAD s/p NSTEMI tx'd medically (2014), HTN, HLD, PAF not on AC d/t high fall risk, mild AS, mod-sev MR, pulm HTN, chronic diastolic CHF (EF 29-92%) who presented to [facility] after a mechanical fall and is being admitted for hip fracture. Cardiology is consulted for pre operative clearance.   She had a NSTEMI 03/2013 and underwent LHC which revealed a high grade stenosis in the first diagonal branch (moderate vessel in length but small in caliber) and a 70% stenosis in the 2nd DX - she was managed medically. In 02/2014 she had rapid PAF and diastolic CHF. Her EF was normal by echo 02/2014 but with grade II diastolic dysfunction, mild AS, moderate to severe MR and mild MS.  She re-admitted to [facility] from 11/11/14-11/18/14 for recurrent atrial fibrillation with RVR. She was discharged home 11/19/14 to [facility] and eventually home for home hospice. However, after speaking with the family it turns out that the patient had actually been doing quite well at her rehab facility, even walking up to 69minutes on a rehab treadmill yesterday. Today she presented to [facility] after what is thought to be a mechanical fall and is being admitted to [facility] for hip fracture.   This fall occurred around 3 AM this morning at her facility. She believes she was sleepwalking. They reportedly gave her an oxycodone at her facility. She currently complains of 10 out of 10 left hip pain. She denies hitting her head or loss of consciousness. She denies any headache or neck pain. She currently only complains of left hip pain. She is alert and oriented 3 here. She has no chest pain or SOB.   Past Medical History    Diagnosis Date  . Atrial fibrillation 04/23-24/2007    a. recurrent PAF with RVR in September 2013. b. Evaluated 03/2013, previously intolerant to Norpace and Amiodarone - consider Multaq if recurs. c. Not on anticoag due to history of falls and also some internal bleeding per son.  . Hypertrophic cardiomyopathy   . Urinary incontinence   . Diverticulosis of colon (without mention of hemorrhage) 2003/ 08/2000    EGD/colonoscopy Barretts esophagus//H.H divertics 08/2000  . Cervical mass     C2 lateral mass fracture  . Hypertension   . Hypercholesterolemia     219/497  . Hypothyroidism   . Multinodular goiter (nontoxic)   . Osteoporosis   . Blood transfusion     "my blood count was a little low"  . Jaundice ~ 1935    "in grade school"  . Degenerative joint disease     back  . Depression     "husband died Feb 21, 2011"  . Personal history of colonic polyps 02/29/2012    tubular adenoma  . Barrett's esophagus   . CAD (coronary artery disease)     a. NSTEMI 03/2013: secondary to diagonal disease (small, not amenable to PCI, for med rx).  . Moderate mitral regurgitation 2014  . Mild aortic stenosis 2014  . Lung nodule seen on imaging study, pt does not wish further work up 02/08/2014  . Mitral valve regurgitation, mod to severe with moderately calcified annulus 02/08/2014  . Mitral valve mass, density -  no fevers to suggest endocarditis 02/08/2014  . HOH (hard of hearing)   . Shortness of breath   . CHF (congestive heart failure)   . Heart murmur   . Anemia, iron deficiency   . GERD (gastroesophageal reflux disease)   . Legally blind     "both eyes/Dr. Zigmund Daniel" (11/11/2014)  . Macular degeneration of both eyes      Past Surgical History  Procedure Laterality Date  . Bladder surgery  1990's    bladder tack early 90's  . Tear duct probing  07/29/03    tear duct surg  . Cystourethroscopy  09/17/03  . Rotator cuff repair Bilateral ? date; 09/07/05    left; right( Dr. Gladstone Lighter)  .  Appendectomy  1941  . Thyroid ultrasound  10/14/2003    MNG, no dominant masses  . Doppler echocardiography  03/05/2002&09/11/2003    ECHO, EF wnl, mild stenosis, A.S. mild MR, Mild T.R03/31/2003//ECHO EF 70%,LVH, ?diast dysfunction 09/11/2003  . Cataract extraction, bilateral  2003  . Dilation and curettage of uterus  09/07/2000    endometrial polyps removed, path all benign   . Tonsillectomy and adenoidectomy      "as a child"  . Fracture surgery  2010    right knee  . Knee arthroscopy Right   . Reduction mammaplasty  1981  . Incontinence surgery      "she's had a tack and a sling"  . Left heart catheterization with coronary angiogram N/A 03/27/2013    Procedure: LEFT HEART CATHETERIZATION WITH CORONARY ANGIOGRAM;  Surgeon: Peter M Martinique, MD;  Location: [facility] CATH LAB;  Service: Cardiovascular;  Laterality: N/A;    Allergies  Allergen Reactions  . Lipitor [Atorvastatin] Nausea Only and Other (See Comments)    LFT elevation  . Morphine And Related Other (See Comments)    "drives me crazy" and hyperactivity  . Irbesartan Swelling  . Ramipril Swelling    REACTION: lips swelling  . Telmisartan-Hctz Other (See Comments)    REACTION: incontinence  . Amiodarone Other (See Comments)    'terrible headaches'  . Hydrochlorothiazide Other (See Comments)    hyponatremia  . Metoprolol Other (See Comments)    Headache, dizzy, "terribly sick"  . Other Other (See Comments)    ANTICOAGULANTS - not a candidate due to history of falls and bleeding  . Tikosyn [Dofetilide] Other (See Comments)    Not a candidate due to Prolonged QT  . Norpace [Disopyramide] Other (See Comments)    Dry mouth    I have reviewed the patient's current medications       Prior to Admission medications   Medication Sig Start Date End Date Taking? Authorizing Provider  acetaminophen (TYLENOL) 325 MG tablet Take 2 tablets (650 mg total) by mouth every 6 (six) hours as needed for mild pain (or Fever >/= 101).  11/18/14  Yes Nita Sells, MD  amiodarone (PACERONE) 200 MG tablet Take 1 tablet (200 mg total) by mouth 2 (two) times daily. 11/18/14  Yes Nita Sells, MD  aspirin 81 MG chewable tablet Chew 1 tablet (81 mg total) by mouth daily. 03/29/13  Yes Dayna N Dunn, PA-C  diltiazem (CARDIZEM) 30 MG tablet Take 1 tablet (30 mg total) by mouth every 6 (six) hours as needed (rapid heart rate.). 09/18/14  Yes Almyra Deforest, PA  docusate sodium (COLACE) 100 MG capsule Take 100 mg by mouth daily as needed for mild constipation.   Yes Historical Provider, MD  ferrous fumarate (HEMOCYTE - 106 MG FE) 325 (  106 FE) MG TABS tablet Take 1 tablet by mouth 2 (two) times daily.   Yes Historical Provider, MD  fluticasone (FLONASE) 50 MCG/ACT nasal spray Place 1 spray into both nostrils daily. 11/18/14  Yes Nita Sells, MD  furosemide (LASIX) 40 MG tablet Take 1 tablet (40 mg total) by mouth 2 (two) times daily. 11/18/14  Yes Nita Sells, MD  guaiFENesin-dextromethorphan (ROBITUSSIN DM) 100-10 MG/5ML syrup Take 5 mLs by mouth every 4 (four) hours as needed for cough. 11/18/14  Yes Nita Sells, MD  HYDROcodone-acetaminophen (NORCO/VICODIN) 5-325 MG per tablet Take 0.5-1 tablets by mouth 2 (two) times daily as needed for moderate pain.   Yes Historical Provider, MD  Hypromellose (ARTIFICIAL TEARS OP) Place 1 drop into both eyes 2 (two) times daily.   Yes Historical Provider, MD  ipratropium-albuterol (DUONEB) 0.5-2.5 (3) MG/3ML SOLN Take 3 mLs by nebulization every 4 (four) hours as needed. Patient taking differently: Take 3 mLs by nebulization every 4 (four) hours as needed (bronchospasm).  11/18/14  Yes Nita Sells, MD  levothyroxine (SYNTHROID, LEVOTHROID) 25 MCG tablet Take 1 tablet (25 mcg total) by mouth every morning. 10/10/14  Yes Tonia Ghent, MD  losartan (COZAAR) 25 MG tablet Take 25 mg by mouth daily.   Yes Historical Provider, MD  metoprolol tartrate (LOPRESSOR) 25 MG  tablet Take 12.5 mg by mouth 2 (two) times daily.   Yes Historical Provider, MD  mirtazapine (REMERON) 15 MG tablet Take 15 mg by mouth at bedtime.   Yes Historical Provider, MD  Multiple Vitamins-Minerals (HEALTHY EYES) TABS Take 1 tablet by mouth 2 (two) times daily.   Yes Historical Provider, MD  nystatin cream (MYCOSTATIN) Apply 1 application topically daily.   Yes Historical Provider, MD  omeprazole (PRILOSEC) 40 MG capsule Take 40 mg by mouth daily.   Yes Historical Provider, MD  zinc oxide (BALMEX) 11.3 % CREA cream Apply 1 application topically daily.   Yes Historical Provider, MD  zolpidem (AMBIEN) 5 MG tablet Take 1 tablet (5 mg total) by mouth at bedtime as needed for sleep. Patient taking differently: Take 5 mg by mouth at bedtime.  09/18/14  Yes Tonia Ghent, MD  metoprolol tartrate (LOPRESSOR) 12.5 mg TABS tablet Take 0.5 tablets (12.5 mg total) by mouth 2 (two) times daily. Patient not taking: Reported on 11/28/2014 11/18/14   Nita Sells, MD  oxyCODONE (ROXICODONE INTENSOL) 20 MG/ML concentrated solution Administer 0.67ml every 2 hours as needed for respiratory distress/moderate to severe pain Patient not taking: Reported on 11/28/2014 11/27/14   Estill Dooms, MD     History   Social History  . Marital Status: Widowed    Spouse Name: N/A    Number of Children: 1  . Years of Education: N/A   Occupational History  . retired    Social History Main Topics  . Smoking status: Former Smoker -- 0.50 packs/day for 4 years    Types: Cigarettes    Quit date: 07/06/1974  . Smokeless tobacco: Never Used  . Alcohol Use: No  . Drug Use: No  . Sexual Activity: No   Other Topics Concern  . Not on file   Social History Narrative   Married, with 5 children out of the home   Occupation: is on the [employer] of [employer]: retired since 1974 from [employer] [place]  Relation Status Death Age  . Mother Deceased      CA + stroke + HBP  .  Father Deceased     died during WWII in his 44's   Family History  Problem Relation Age of Onset  . Heart failure Mother     CHF, DM, HBP  . Hypertension Mother   . Uterine cancer Mother   . Stroke Mother   . Colon cancer Neg Hx   . Esophageal cancer Neg Hx   . Rectal cancer Neg Hx   . Stomach cancer Neg Hx      ROS:  Full 14 point review of systems complete and found to be negative unless listed above.  Physical Exam: Blood pressure 112/27, pulse 77, temperature 97.8 F (36.6 C), temperature source Oral, resp. rate 19, SpO2 93 %.  General: Well developed, well nourished, female in no acute distress Head: Eyes PERRLA, No xanthomas.   Normocephalic and atraumatic, oropharynx without edema or exudate.  Lungs: CTAB.  Heart: HRRR S1 S2, no rub/gallop, Heart irregular rate and rhythm with S1, S2  SEM.Marland Kitchen pulses are 2+ extrem.   Neck: No carotid bruits. No lymphadenopathy. no JVD. Abdomen: Bowel sounds present, abdomen soft and non-tender without masses or hernias noted. Msk:  No spine or cva tenderness. No weakness, no joint deformities or effusions. Extremities: No clubbing or cyanosis. no edema.  Neuro: Alert and oriented X 3. No focal deficits noted. Psych:  Good affect, responds appropriately Skin: No rashes or lesions noted.  Labs:   Lab Results  Component Value Date   WBC 7.1 11/28/2014   HGB 9.0* 11/28/2014   HCT 31.9* 11/28/2014   MCV 76.5* 11/28/2014   PLT 223 11/28/2014   No results for input(s): INR in the last 72 hours.  Recent Labs Lab 11/28/14 0919  NA 134*  K 4.4  CL 98  CO2 28  BUN 13  CREATININE 1.01  CALCIUM 8.7  PROT 5.5*  BILITOT 0.6  ALKPHOS 58  ALT 19  AST 25  GLUCOSE 109*  ALBUMIN 3.1*    Recent Labs  11/28/14 0902  TROPONINI 0.06*    PRO B NATRIURETIC PEPTIDE (BNP)  Date/Time Value Ref Range Status  11/11/2014 10:28 AM 2417.0* 0 - 450 pg/mL Final  09/16/2014 01:39 PM 2382.0* 0 - 450 pg/mL Final   TSH    Date/Time Value Ref Range Status  11/13/2014 12:50 PM 1.200 0.350 - 4.500 uIU/mL Final     Echo: Study Date: 02/05/2014 LV EF: 60% -   65% Study Conclusions - Left ventricle: The cavity size was normal. There was   moderate concentric hypertrophy. Systolic function was   normal. The estimated ejection fraction was in the range   of 60% to 65%. Wall motion was normal; there were no   regional wall motion abnormalities. Features are   consistent with a pseudonormal left ventricular filling   pattern, with concomitant abnormal relaxation and   increased filling pressure (grade 2 diastolic   dysfunction). - Aortic valve: Severe thickening and calcification. There   was mild stenosis. Trivial regurgitation. Valve area:   1.18cm^2(VTI). Valve area: 0.96cm^2 (Vmax). - Mitral valve: There appears to be a mobile density off of   the anterior MV leaflet that is only appreciated in the   apical views and may represent a vegetation. Recommend TEE   for further evaluation as well as determine the etiology   of significant MR Moderately calcified annulus. Moderate   to severe regurgitation directed eccentrically and toward   the septum. Valve area by continuity equation (using LVOT   flow): 1.53cm^2. - Left atrium:  The atrium was mildly dilated. - Right ventricle: The cavity size was mildly dilated. - Tricuspid valve: Moderate regurgitation. - Pulmonary arteries: PA peak pressure: 30mm Hg (S). Impressions: - The right ventricular systolic pressure was increased   consistent with mild pulmonary hypertension.   ECG:  HR 77 Normal sinus rhythm with sinus arrhythmia Left axis deviation Left ventricular hypertrophy with repolarization abnormality  Radiology:  Dg Chest 1 View  11/28/2014   CLINICAL DATA:  Fall today.  Initial encounter.  EXAM: CHEST - 1 VIEW  COMPARISON:  11/16/2014 and 11/13/2014 radiographs. Chest CT 06/28/2010.  FINDINGS: 0928 hr. There is stable cardiomegaly, aortic  atherosclerosis and retrocardiac density consistent with a hiatal hernia. There is chronic vascular congestion and left lower lobe scarring. No edema, confluent airspace opacity or pleural effusion is seen. Old rib fractures are present on the left. There is a mild thoracolumbar scoliosis. Postsurgical changes are present at both shoulders with chronic narrowing of the subacromial space on the right. No acute osseous findings evident.  IMPRESSION: No acute cardiopulmonary process. Stable cardiomegaly and chronic vascular congestion.   Electronically Signed   By: Camie Patience M.D.   On: 11/28/2014 10:05   Dg Elbow Complete Right  11/28/2014   CLINICAL DATA:  Fall today with posterior elbow bruising. Initial encounter.  EXAM: RIGHT ELBOW - COMPLETE 3+ VIEW  COMPARISON:  None.  FINDINGS: The bones appear mildly demineralized. There is no evidence acute fracture, dislocation or elbow joint effusion. There are mild degenerative changes with mild spurring of the coronoid process and lateral humeral epicondyle. There is mild dorsal soft tissue swelling. No foreign bodies are demonstrated.  IMPRESSION: No acute osseous findings. Osteopenia and mild degenerative changes noted.   Electronically Signed   By: Camie Patience M.D.   On: 11/28/2014 10:08   Dg Hip Complete Left  11/28/2014   CLINICAL DATA:  Fall today.  Left hip pain.  Initial encounter  EXAM: LEFT HIP - COMPLETE 2+ VIEW  COMPARISON:  None.  FINDINGS: Acute fracture of the lower femoral neck on the left with displacement and angulation. Hip joint space is normal. No other fracture in the pelvis. Right hip joint is normal.  IMPRESSION: Left femoral neck fracture.   Electronically Signed   By: Franchot Gallo M.D.   On: 11/28/2014 10:06    ASSESSMENT AND PLAN:    Active Problems:   Closed left hip fracture   Femur fracture  Cindy [name] is a 80 y.o. female with a history of HOCM, CAD s/p NSTEMI tx'd medically (2014), HTN, HLD, PAF not on AC d/t high  fall risk, mild AS, mod-sev MR, pulm HTN, chronic diastolic CHF (EF 13-24%) who presented to [facility] after a mechanical fall and is being admitted for hip fracture. Cardiology is consulted for pre operative clearance.   Hip fracture- she is CLEARED from a cardiology standpoint to [place] with surgery. She had a recent ECHO will good LV EF, she walked 6 minutes on a rehab treadmill yesterday with no issues ( per family report ), she is maintaining NSR, No CP or SOB.   PAF- currently maintaining NSR -- Continue amiodarone, dilt 30mg  q6, lopressor 12.5mg  BID -- No AC due to high fall risk  Elevated troponin- miniscule elevation in troponin. We feel this is insignificant  -- No CP, ECG with no acute ST or TW changes.  Very clear from the patient's son that this patient is viable. Both the patient and her son are completely in  favor with proceeding with surgery. She remains DNR and understands the risks of the procedure.   SignedCrista Luria 11/28/2014 1:01 PM Pager [phone]  Patient seen and examined. I agree with the assessment and plan as detailed above. See also my additional thoughts below.   I spoke at length with the patient's son in the room. He makes it quite clear that they understand any risks of surgery. They want surgery. Need to be careful with her volume status. She is DNR if she crashes, but I suspect cardiac will be fine.  Dola Argyle, MD, [facility] 11/28/2014 1:38 PM

## 2014-11-29 ENCOUNTER — Encounter (HOSPITAL_COMMUNITY): Payer: Self-pay | Admitting: Internal Medicine

## 2014-11-29 DIAGNOSIS — Z419 Encounter for procedure for purposes other than remedying health state, unspecified: Secondary | ICD-10-CM

## 2014-11-29 DIAGNOSIS — I270 Primary pulmonary hypertension: Secondary | ICD-10-CM

## 2014-11-29 DIAGNOSIS — S72002A Fracture of unspecified part of neck of left femur, initial encounter for closed fracture: Secondary | ICD-10-CM

## 2014-11-29 DIAGNOSIS — S72142A Displaced intertrochanteric fracture of left femur, initial encounter for closed fracture: Principal | ICD-10-CM

## 2014-11-29 DIAGNOSIS — I10 Essential (primary) hypertension: Secondary | ICD-10-CM

## 2014-11-29 DIAGNOSIS — I5032 Chronic diastolic (congestive) heart failure: Secondary | ICD-10-CM

## 2014-11-29 DIAGNOSIS — W19XXXA Unspecified fall, initial encounter: Secondary | ICD-10-CM

## 2014-11-29 DIAGNOSIS — I480 Paroxysmal atrial fibrillation: Secondary | ICD-10-CM

## 2014-11-29 LAB — BASIC METABOLIC PANEL
ANION GAP: 9 (ref 5–15)
BUN: 14 mg/dL (ref 6–23)
CHLORIDE: 94 meq/L — AB (ref 96–112)
CO2: 29 mmol/L (ref 19–32)
Calcium: 8.2 mg/dL — ABNORMAL LOW (ref 8.4–10.5)
Creatinine, Ser: 1.05 mg/dL (ref 0.50–1.10)
GFR calc Af Amer: 53 mL/min — ABNORMAL LOW (ref >90)
GFR calc non Af Amer: 45 mL/min — ABNORMAL LOW (ref >90)
Glucose, Bld: 109 mg/dL — ABNORMAL HIGH (ref 70–99)
Potassium: 4.8 mmol/L (ref 3.5–5.1)
Sodium: 132 mmol/L — ABNORMAL LOW (ref 135–145)

## 2014-11-29 LAB — CBC
HCT: 36.7 % (ref 36.0–46.0)
Hemoglobin: 11.4 g/dL — ABNORMAL LOW (ref 12.0–15.0)
MCH: 24.2 pg — ABNORMAL LOW (ref 26.0–34.0)
MCHC: 31.1 g/dL (ref 30.0–36.0)
MCV: 77.8 fL — ABNORMAL LOW (ref 78.0–100.0)
Platelets: 179 10*3/uL (ref 150–400)
RBC: 4.72 MIL/uL (ref 3.87–5.11)
RDW: 17.4 % — ABNORMAL HIGH (ref 11.5–15.5)
WBC: 9 10*3/uL (ref 4.0–10.5)

## 2014-11-29 MED ORDER — HYDROMORPHONE HCL 1 MG/ML IJ SOLN: 0.5000 mg | INTRAMUSCULAR | Status: DC | PRN

## 2014-11-29 NOTE — Care Management Note (Unsigned)
    Page 1 of 1   11/29/2014     3:55:26 PM CARE MANAGEMENT NOTE 11/29/2014  Patient:  Cindy [name],Cindy [name]   Account Number:  [id number]  Date Initiated:  11/29/2014  Documentation initiated by:  Oliveras-Aizpurua,Jeannette  Subjective/Objective Assessment:   80 yo female admitted with a L femoral fx after falling at the nursing home. She had  L Femoral IM Nail.     Action/Plan:   Son spoke with CM regarding d/[name] plan.   Anticipated DC Date:  12/02/2014   Anticipated DC Plan:  SKILLED NURSING FACILITY  In-house referral  Clinical Social Worker      DC Planning Services  CM consult      [facility] Choice  NA   Choice offered to / List presented to:             Status of service:  In process, will continue to follow Medicare Important Message given?   (If response is "NO", the following Medicare IM given date fields will be blank) Date Medicare IM given:   Medicare IM given by:   Date Additional Medicare IM given:   Additional Medicare IM given by:    Discharge Disposition:    Per UR Regulation:    If discussed at Long Length of Stay Meetings, dates discussed:    Comments:  11/29/14 - [place], RN, BSN  Dischage plan is for pt to return to [facility]. Son stated that they are holding her bed. Informed son that SW will f/u.

## 2014-11-29 NOTE — Progress Notes (Signed)
Patient Name: Cindy [name]      SUBJECTIVE admitted with hip fracture and yesterday underwent repair  Frustrated but without chest pain or shortness of breath  Hyperthophic heart disease with asymmetry (wall thickness 15/10 2007)  But not evident wall thickness15/14 2013) ? HCM-in chart , CAD s/p NSTEMI tx'd medically (2014), HTN, HLD, PAF not on AC d/t high fall risk, mild AS, mod-sev MR, pulm HTN, chronic diastolic CHF (EF 63-78%)  Hx of afib  Past Medical History  Diagnosis Date  . Atrial fibrillation 04/23-24/2007    a. recurrent PAF with RVR in September 2013. b. Evaluated 03/2013, previously intolerant to Norpace and Amiodarone - consider Multaq if recurs. c. Not on anticoag due to history of falls and also some internal bleeding per son.  . Hypertrophic cardiomyopathy   . Urinary incontinence   . Diverticulosis of colon (without mention of hemorrhage) 2003/ 08/2000    EGD/colonoscopy Barretts esophagus//H.H divertics 08/2000  . Cervical mass     C2 lateral mass fracture  . Hypertension   . HLD (hyperlipidemia)     219/497  . Hypothyroidism   . Multinodular goiter (nontoxic)   . Osteoporosis   . Jaundice ~ 1935    "in grade school"  . Degenerative joint disease   . Depression   . Personal history of colonic polyps 02/29/2012    tubular adenoma  . Barrett's esophagus   . CAD (coronary artery disease)     a. NSTEMI 03/2013: 2/2 diagonal disease (small, not amenable to PCI, for med rx).  . Moderate mitral regurgitation 2014  . Mild aortic stenosis 2014  . HOH (hard of hearing)   . Heart murmur   . Anemia, iron deficiency   . GERD (gastroesophageal reflux disease)   . Legally blind     "both eyes/Dr. Zigmund Daniel" (11/11/2014)  . Macular degeneration of both eyes   . Paroxysmal atrial fibrillation     a. not on AC due to high fall risk    Scheduled Meds:  Scheduled Meds: . amiodarone  200 mg Oral BID  . aspirin  81 mg Oral Daily  . docusate sodium  100  mg Oral BID  . enoxaparin (LOVENOX) injection  30 mg Subcutaneous Q24H  . ferrous fumarate  1 tablet Oral BID  . fluticasone  1 spray Each Nare Daily  . furosemide  40 mg Oral BID  . levothyroxine  25 mcg Oral QAC breakfast  . losartan  25 mg Oral Daily  . metoprolol tartrate  12.5 mg Oral BID  . mirtazapine  15 mg Oral QHS  . pantoprazole  40 mg Oral Daily  . senna  1 tablet Oral BID   Continuous Infusions: . sodium chloride    . lactated ringers 10 mL/hr at 11/28/14 1813   acetaminophen **OR** acetaminophen, diltiazem, docusate sodium, HYDROcodone-acetaminophen, HYDROmorphone (DILAUDID) injection, ipratropium-albuterol, menthol-cetylpyridinium **OR** phenol, metoCLOPramide **OR** metoCLOPramide (REGLAN) injection, ondansetron **OR** ondansetron (ZOFRAN) IV, oxyCODONE, polyethylene glycol    PHYSICAL EXAM Filed Vitals:   11/29/14 0215 11/29/14 0318 11/29/14 0400 11/29/14 0628  BP: 113/29 107/30  117/46  Pulse: 70 63  62  Temp: 98.2 F (36.8 C) 98.6 F (37 C)  97.6 F (36.4 C)  TempSrc: Oral Oral  Oral  Resp: 16 16 16 16   Height:      Weight:      SpO2: 100% 100% 100% 100%    Well developed and nourished in no acute distress HENT normal Neck supple  with JVP-flat Clear Regular rate and rhythm, no murmurs or gallops Abd-soft with active BS No Clubbing cyanosis edema Skin-warm and dry A & Oriented  Grossly normal sensory and motor function   TELEMETRY: Reviewed telemetry pt in nsr    Intake/Output Summary (Last 24 hours) at 11/29/14 0734 Last data filed at 11/29/14 8372  Gross per 24 hour  Intake   1259 ml  Output   1200 ml  Net     59 ml    LABS: Basic Metabolic Panel:  Recent Labs Lab 11/28/14 0919  NA 134*  K 4.4  CL 98  CO2 28  GLUCOSE 109*  BUN 13  CREATININE 1.01  CALCIUM 8.7   Cardiac Enzymes:  Recent Labs  11/28/14 0902  TROPONINI 0.06*   CBC:  Recent Labs Lab 11/28/14 0919  WBC 7.1  NEUTROABS 4.9  HGB 9.0*  HCT 31.9*  MCV  76.5*  PLT 223   PROTIME: No results for input(s): LABPROT, INR in the last 72 hours. Liver Function Tests:  Recent Labs  11/28/14 0919  AST 25  ALT 19  ALKPHOS 58  BILITOT 0.6  PROT 5.5*  ALBUMIN 3.1*   No results for input(s): LIPASE, AMYLASE in the last 72 hours. BNP: BNP (last 3 results)  Recent Labs  08/19/14 1117 09/16/14 1339 11/11/14 1028  PROBNP 1807.0* 2382.0* 2417.0*     ASSESSMENT AND PLAN:  Active Problems:   Essential hypertension   CAD (coronary artery disease)   PAF (paroxysmal atrial fibrillation)   Chronic diastolic CHF (congestive heart failure)   Pulmonary hypertension   Closed left hip fracture   Intertrochanteric fracture of left hip  Stable cardiac status euvolemic  Signed, Virl Axe MD  11/29/2014

## 2014-11-29 NOTE — Progress Notes (Signed)
[facility] PROGRESS NOTE   Cindy [name] VOJ:[id number] DOB: 1922-06-16 DOA: 11/28/2014 PCP: Elsie Stain, MD  HPI/Subjective: Seen with son at bedside, complaining about minimal pain.  Assessment/Plan: Principal Problem:   Intertrochanteric fracture of left hip Active Problems:   Essential hypertension   CAD (coronary artery disease)   PAF (paroxysmal atrial fibrillation)   Chronic diastolic CHF (congestive heart failure)   Pulmonary hypertension   Closed left hip fracture    Left hip intertrochanteric fracture Sustained left hip intertrochanteric fracture after mechanical fall. Orthopedics consulted, open reduction internal fixation with left trochanteric hip nail done by Dr. Lorin Mercy. PT/OT ordered. Weightbearing as tolerated per orthopedics. Pain is controlled with narcotics. DVT prophylaxis with subcutaneous Lovenox.  Paroxysmal atrial fibrillation Rate is controlled, continue home medications. Patient not on anticoagulation, given her recurrent falls.  Chronic diastolic CHF No symptoms or signs of decompensation, appears euvolemic.Marland Kitchen  Pulmonary arterial hypertension Stable, no shortness of breath.  Essential hypertension/CAD Continue home medications, ARB held for 1 day restart. Continue statin and aspirin, no chest pain after surgery.  Code Status: DO NOT RESUSCITATE Family Communication: Plan discussed with the patient. Disposition Plan: Remains inpatient   Consultants:  Orthopedics.  Cardiology  Procedures:  ORIF with left trochanteric hip nail done by Dr. Lorin Mercy on 12/24.  Antibiotics:   perioperative   Objective: Filed Vitals:   11/29/14 0628  BP: 117/46  Pulse: 62  Temp: 97.6 F (36.4 C)  Resp: 16    Intake/Output Summary (Last 24 hours) at 11/29/14 1143 Last data filed at 11/29/14 9937  Gross per 24 hour  Intake   1159 ml  Output   1200 ml  Net    -41 ml   Filed Weights   11/28/14 1326  Weight: 56.1 kg (123 lb 10.9  oz)    Exam: General: Alert and awake, oriented x3, not in any acute distress. HEENT: anicteric sclera, pupils reactive to light and accommodation, EOMI CVS: S1-S2 clear, no murmur rubs or gallops Chest: clear to auscultation bilaterally, no wheezing, rales or rhonchi Abdomen: soft nontender, nondistended, normal bowel sounds, no organomegaly Extremities: no cyanosis, clubbing or edema noted bilaterally Neuro: Cranial nerves II-XII intact, no focal neurological deficits  Data Reviewed: Basic Metabolic Panel:  Recent Labs Lab 11/28/14 0919 11/29/14 0755  NA 134* 132*  K 4.4 4.8  CL 98 94*  CO2 28 29  GLUCOSE 109* 109*  BUN 13 14  CREATININE 1.01 1.05  CALCIUM 8.7 8.2*   Liver Function Tests:  Recent Labs Lab 11/28/14 0919  AST 25  ALT 19  ALKPHOS 58  BILITOT 0.6  PROT 5.5*  ALBUMIN 3.1*   No results for input(s): LIPASE, AMYLASE in the last 168 hours. No results for input(s): AMMONIA in the last 168 hours. CBC:  Recent Labs Lab 11/28/14 0919 11/29/14 0755  WBC 7.1 9.0  NEUTROABS 4.9  --   HGB 9.0* 11.4*  HCT 31.9* 36.7  MCV 76.5* 77.8*  PLT 223 179   Cardiac Enzymes:  Recent Labs Lab 11/28/14 0902  TROPONINI 0.06*   BNP (last 3 results)  Recent Labs  08/19/14 1117 09/16/14 1339 11/11/14 1028  PROBNP 1807.0* 2382.0* 2417.0*   CBG: No results for input(s): GLUCAP in the last 168 hours.  Micro No results found for this or any previous visit (from the past 240 hour(s)).   Studies: Dg Chest 1 View  11/28/2014   CLINICAL DATA:  Fall today.  Initial encounter.  EXAM: CHEST - 1 VIEW  COMPARISON:  11/16/2014 and 11/13/2014 radiographs. Chest CT 06/28/2010.  FINDINGS: 0928 hr. There is stable cardiomegaly, aortic atherosclerosis and retrocardiac density consistent with a hiatal hernia. There is chronic vascular congestion and left lower lobe scarring. No edema, confluent airspace opacity or pleural effusion is seen. Old rib fractures are present on  the left. There is a mild thoracolumbar scoliosis. Postsurgical changes are present at both shoulders with chronic narrowing of the subacromial space on the right. No acute osseous findings evident.  IMPRESSION: No acute cardiopulmonary process. Stable cardiomegaly and chronic vascular congestion.   Electronically Signed   By: Camie Patience M.D.   On: 11/28/2014 10:05   Dg Elbow Complete Right  11/28/2014   CLINICAL DATA:  Fall today with posterior elbow bruising. Initial encounter.  EXAM: RIGHT ELBOW - COMPLETE 3+ VIEW  COMPARISON:  None.  FINDINGS: The bones appear mildly demineralized. There is no evidence acute fracture, dislocation or elbow joint effusion. There are mild degenerative changes with mild spurring of the coronoid process and lateral humeral epicondyle. There is mild dorsal soft tissue swelling. No foreign bodies are demonstrated.  IMPRESSION: No acute osseous findings. Osteopenia and mild degenerative changes noted.   Electronically Signed   By: Camie Patience M.D.   On: 11/28/2014 10:08   Dg Hip Complete Left  11/28/2014   CLINICAL DATA:  Fall today.  Left hip pain.  Initial encounter  EXAM: LEFT HIP - COMPLETE 2+ VIEW  COMPARISON:  None.  FINDINGS: Acute fracture of the lower femoral neck on the left with displacement and angulation. Hip joint space is normal. No other fracture in the pelvis. Right hip joint is normal.  IMPRESSION: Left femoral neck fracture.   Electronically Signed   By: Franchot Gallo M.D.   On: 11/28/2014 10:06   Dg Hip Operative Left  11/28/2014   CLINICAL DATA:  ORIF LEFT hip  EXAM: OPERATIVE LEFT HIP  COMPARISON:  Two digital C-arm fluoroscopic images obtained intraoperatively are compared to preoperative study of 11/28/2014  FINDINGS: IM nail with 2 screws placed across the previously identified LEFT femoral neck fracture.  No dislocation.  Bones appear demineralized.  Distal extent of hardware not imaged.  IMPRESSION: Post ORIF LEFT femoral fracture.    Electronically Signed   By: Lavonia Dana M.D.   On: 11/28/2014 17:15    Scheduled Meds: . amiodarone  200 mg Oral BID  . aspirin  81 mg Oral Daily  . docusate sodium  100 mg Oral BID  . enoxaparin (LOVENOX) injection  30 mg Subcutaneous Q24H  . ferrous fumarate  1 tablet Oral BID  . fluticasone  1 spray Each Nare Daily  . furosemide  40 mg Oral BID  . levothyroxine  25 mcg Oral QAC breakfast  . losartan  25 mg Oral Daily  . metoprolol tartrate  12.5 mg Oral BID  . mirtazapine  15 mg Oral QHS  . pantoprazole  40 mg Oral Daily  . senna  1 tablet Oral BID   Continuous Infusions: . sodium chloride    . lactated ringers 10 mL/hr at 11/28/14 1813       Time spent: 35 minutes    [facility] A  [facility] Pager [phone] If 7PM-7AM, please contact night-coverage at [email or web address], password [facility] 11/29/2014, 11:43 AM  LOS: 1 day

## 2014-11-29 NOTE — Progress Notes (Signed)
Subjective: 1 Day Post-Op Procedure(s) (LRB): INTRAMEDULLARY  NAIL Left FEMORAL (Left) Patient reports pain as moderate.    Objective: Vital signs in last 24 hours: Temp:  [97.4 F (36.3 C)-98.9 F (37.2 C)] 97.6 F (36.4 C) (12/25 0628) Pulse Rate:  [62-106] 62 (12/25 0628) Resp:  [13-23] 16 (12/25 0628) BP: (102-154)/(26-62) 117/46 mmHg (12/25 0628) SpO2:  [90 %-100 %] 100 % (12/25 0628) Weight:  [56.1 kg (123 lb 10.9 oz)] 56.1 kg (123 lb 10.9 oz) (12/24 1326)  Intake/Output from previous day: 12/24 0701 - 12/25 0700 In: 1259 [I.V.:500; Blood:609; IV Piggyback:50] Out: 1200 [Urine:1100; Blood:100] Intake/Output this shift:     Recent Labs  11/28/14 0919  HGB 9.0*    Recent Labs  11/28/14 0919  WBC 7.1  RBC 4.17  HCT 31.9*  PLT 223    Recent Labs  11/28/14 0919 11/29/14 0755  NA 134* 132*  K 4.4 4.8  CL 98 94*  CO2 28 29  BUN 13 14  CREATININE 1.01 1.05  GLUCOSE 109* 109*  CALCIUM 8.7 8.2*   No results for input(s): LABPT, INR in the last 72 hours.  Neurologically intact  Assessment/Plan: 1 Day Post-Op Procedure(s) (LRB): INTRAMEDULLARY  NAIL Left FEMORAL (Left) Up with therapy  Kore Madlock C 11/29/2014, 9:32 AM

## 2014-11-30 LAB — BASIC METABOLIC PANEL
Anion gap: 10 (ref 5–15)
BUN: 12 mg/dL (ref 6–23)
CALCIUM: 8.1 mg/dL — AB (ref 8.4–10.5)
CO2: 25 mmol/L (ref 19–32)
Chloride: 94 mEq/L — ABNORMAL LOW (ref 96–112)
Creatinine, Ser: 1.1 mg/dL (ref 0.50–1.10)
GFR, EST AFRICAN AMERICAN: 50 mL/min — AB (ref >90)
GFR, EST NON AFRICAN AMERICAN: 43 mL/min — AB (ref >90)
Glucose, Bld: 88 mg/dL (ref 70–99)
POTASSIUM: 3.8 mmol/L (ref 3.5–5.1)
Sodium: 129 mmol/L — ABNORMAL LOW (ref 135–145)

## 2014-11-30 LAB — CBC
HCT: 32.1 % — ABNORMAL LOW (ref 36.0–46.0)
Hemoglobin: 9.8 g/dL — ABNORMAL LOW (ref 12.0–15.0)
MCH: 23.8 pg — ABNORMAL LOW (ref 26.0–34.0)
MCHC: 30.5 g/dL (ref 30.0–36.0)
MCV: 78.1 fL (ref 78.0–100.0)
Platelets: 175 10*3/uL (ref 150–400)
RBC: 4.11 MIL/uL (ref 3.87–5.11)
RDW: 17.8 % — AB (ref 11.5–15.5)
WBC: 7.2 10*3/uL (ref 4.0–10.5)

## 2014-11-30 MED ORDER — AMIODARONE HCL 200 MG PO TABS
200.0000 mg | ORAL_TABLET | Freq: Every day | ORAL | Status: DC
  Administered 2014-11-30 – 2014-12-02 (×3): 200 mg via ORAL
  Filled 2014-11-30 (×2): qty 1

## 2014-11-30 MED ORDER — POTASSIUM CHLORIDE CRYS ER 20 MEQ PO TBCR
40.0000 meq | EXTENDED_RELEASE_TABLET | Freq: Once | ORAL | Status: AC
  Administered 2014-11-30: 40 meq via ORAL

## 2014-11-30 NOTE — Progress Notes (Signed)
OT Cancellation Note  Patient Details Name: Cindy [name] MRN: [id number] DOB: [birth year]   Cancelled Treatment:    Reason Eval/Treat Not Completed: OT screened, no needs identified, will sign off - Pt from SNF and planning to return to SNF.  Will defer OT needs to SNF  Darlina Rumpf [place], OTR/L [phone]  11/30/2014, 4:42 PM

## 2014-11-30 NOTE — Progress Notes (Signed)
Subjective: 2 Days Post-Op Procedure(s) (LRB): INTRAMEDULLARY  NAIL Left FEMORAL (Left) Patient reports pain as mild.    Objective: Vital signs in last 24 hours: Temp:  [97.9 F (36.6 C)-98.6 F (37 C)] 98.6 F (37 C) (12/26 0449) Pulse Rate:  [68-80] 68 (12/26 0449) Resp:  [14-16] 16 (12/26 0449) BP: (128-144)/(31-111) 144/31 mmHg (12/26 0449) SpO2:  [91 %-97 %] 97 % (12/26 0449)  Intake/Output from previous day: 12/25 0701 - 12/26 0700 In: 720 [P.O.:720] Out: 1350 [Urine:1350] Intake/Output this shift: Total I/O In: 240 [P.O.:240] Out: 200 [Urine:200]   Recent Labs  11/28/14 0919 11/29/14 0755 11/30/14 0518  HGB 9.0* 11.4* 9.8*    Recent Labs  11/29/14 0755 11/30/14 0518  WBC 9.0 7.2  RBC 4.72 4.11  HCT 36.7 32.1*  PLT 179 175    Recent Labs  11/29/14 0755 11/30/14 0518  NA 132* 129*  K 4.8 3.8  CL 94* 94*  CO2 29 25  BUN 14 12  CREATININE 1.05 1.10  GLUCOSE 109* 88  CALCIUM 8.2* 8.1*   No results for input(s): LABPT, INR in the last 72 hours.  Neurologically intact  Assessment/Plan: 2 Days Post-Op Procedure(s) (LRB): INTRAMEDULLARY  NAIL Left FEMORAL (Left) Up with therapy   SNF  Dorene Bruni C 11/30/2014, 8:01 AM

## 2014-11-30 NOTE — Progress Notes (Signed)
INITIAL NUTRITION ASSESSMENT  DOCUMENTATION CODES Per approved criteria  -Not Applicable   INTERVENTION: 1.  General healthful diet; encourage intake of foods and beverages as able.  RD to follow and assess for nutritional adequacy.   NUTRITION DIAGNOSIS: Increased nutrient needs related to healing as evidenced by hip fx.   Monitor:  1.  Food/Beverage; pt meeting >/=90% estimated needs with tolerance. 2.  Wt/wt change; monitor trends  Reason for Assessment: consult; hip fx  80 y.o. female  Admitting Dx: Intertrochanteric fracture of left hip  ASSESSMENT: Pt admitted s/p fall at home facility with hip fracture.  RD met with patient who is restless and slightly confused at time of visit.  Patient unable to contribute to nutrition history at this time.  Patient's weight is variable, currently eating 75% of her meals.    No nutrition interventions warranted at this time as patient is eating well to support increased needs.  Remains as moderate nutrition risk due to confusion and variable weight.   Nutrition Focused Physical Exam: Subcutaneous Fat:  Orbital Region: WNL Upper Arm Region: WNL Thoracic and Lumbar Region: WNL  Muscle:  Temple Region: WNL Clavicle Bone Region: WNL Clavicle and Acromion Bone Region: WNL Scapular Bone Region: WNL Dorsal Hand: mild wasting Patellar Region: not assessed Anterior Thigh Region: not assessed Posterior Calf Region: not assessed  Edema: none present  Height: Ht Readings from Last 1 Encounters:  11/28/14 4' 11"  (1.499 m)    Weight: Wt Readings from Last 1 Encounters:  11/28/14 123 lb 10.9 oz (56.1 kg)    Ideal Body Weight: 98 lbs  % Ideal Body Weight: 125%  Wt Readings from Last 10 Encounters:  11/28/14 123 lb 10.9 oz (56.1 kg)  11/19/14 123 lb 12.8 oz (56.155 kg)  11/18/14 123 lb 14.4 oz (56.2 kg)  10/07/14 133 lb 11.2 oz (60.646 kg)  09/18/14 133 lb 3.2 oz (60.419 kg)  09/10/14 135 lb 1.6 oz (61.281 kg)  08/20/14 131  lb 11.2 oz (59.739 kg)  08/15/14 135 lb (61.236 kg)  07/01/14 131 lb 8 oz (59.648 kg)  06/26/14 129 lb 3.2 oz (58.605 kg)    Usual Body Weight: 130-135 lbs  % Usual Body Weight: 95%  BMI:  Body mass index is 24.97 kg/(m^2).  Estimated Nutritional Needs: Kcal: 7989-2119 Protein: 50-60g Fluid: >1.5 L/day  Skin: surgical incision  Diet Order: DIET SOFT  EDUCATION NEEDS: -Education not appropriate at this time   Intake/Output Summary (Last 24 hours) at 11/30/14 1147 Last data filed at 11/30/14 0758  Gross per 24 hour  Intake    720 ml  Output   1550 ml  Net   -830 ml    Last BM: 12/25   Labs:   Recent Labs Lab 11/28/14 0919 11/29/14 0755 11/30/14 0518  NA 134* 132* 129*  K 4.4 4.8 3.8  CL 98 94* 94*  CO2 28 29 25   BUN 13 14 12   CREATININE 1.01 1.05 1.10  CALCIUM 8.7 8.2* 8.1*  GLUCOSE 109* 109* 88    CBG (last 3)  No results for input(s): GLUCAP in the last 72 hours.  Scheduled Meds: . amiodarone  200 mg Oral Daily  . aspirin  81 mg Oral Daily  . docusate sodium  100 mg Oral BID  . enoxaparin (LOVENOX) injection  30 mg Subcutaneous Q24H  . ferrous fumarate  1 tablet Oral BID  . fluticasone  1 spray Each Nare Daily  . furosemide  40 mg Oral BID  .  levothyroxine  25 mcg Oral QAC breakfast  . losartan  25 mg Oral Daily  . metoprolol tartrate  12.5 mg Oral BID  . mirtazapine  15 mg Oral QHS  . pantoprazole  40 mg Oral Daily  . potassium chloride  40 mEq Oral Once  . senna  1 tablet Oral BID    Continuous Infusions: . lactated ringers 10 mL/hr at 11/28/14 1813    Past Medical History  Diagnosis Date  . Atrial fibrillation 04/23-24/2007    a. recurrent PAF with RVR in September 2013. b. Evaluated 03/2013, previously intolerant to Norpace and Amiodarone - consider Multaq if recurs. c. Not on anticoag due to history of falls and also some internal bleeding per son.  . Hypertrophic cardiomyopathy     i am not sure SK 2015  . Urinary incontinence    . Diverticulosis of colon (without mention of hemorrhage) 2003/ 08/2000    EGD/colonoscopy Barretts esophagus//H.H divertics 08/2000  . Cervical mass     C2 lateral mass fracture  . Hypertension   . HLD (hyperlipidemia)     219/497  . Hypothyroidism   . Multinodular goiter (nontoxic)   . Osteoporosis   . Jaundice ~ 1935    "in grade school"  . Degenerative joint disease   . Depression   . Personal history of colonic polyps 02/29/2012    tubular adenoma  . Barrett's esophagus   . CAD (coronary artery disease)     a. NSTEMI 03/2013: 2/2 diagonal disease (small, not amenable to PCI, for med rx).  . Moderate mitral regurgitation 2014  . Mild aortic stenosis 2014  . HOH (hard of hearing)   . Anemia, iron deficiency   . GERD (gastroesophageal reflux disease)   . Legally blind     "both eyes/Dr. Zigmund Daniel" (11/11/2014)  . Macular degeneration of both eyes     Past Surgical History  Procedure Laterality Date  . Bladder surgery  1990's    bladder tack early 90's  . Tear duct probing  07/29/03    tear duct surg  . Cystourethroscopy  09/17/03  . Rotator cuff repair Bilateral ? date; 09/07/05    left; right( Dr. Gladstone Lighter)  . Appendectomy  1941  . Thyroid ultrasound  10/14/2003    MNG, no dominant masses  . Doppler echocardiography  03/05/2002&09/11/2003    ECHO, EF wnl, mild stenosis, A.S. mild MR, Mild T.R03/31/2003//ECHO EF 70%,LVH, ?diast dysfunction 09/11/2003  . Cataract extraction, bilateral  2003  . Dilation and curettage of uterus  09/07/2000    endometrial polyps removed, path all benign   . Tonsillectomy and adenoidectomy      "as a child"  . Fracture surgery  2010    right knee  . Knee arthroscopy Right   . Reduction mammaplasty  1981  . Incontinence surgery      "she's had a tack and a sling"  . Left heart catheterization with coronary angiogram N/A 03/27/2013    Procedure: LEFT HEART CATHETERIZATION WITH CORONARY ANGIOGRAM;  Surgeon: Peter M Martinique, MD;  Location: [facility]  CATH LAB;  Service: Cardiovascular;  Laterality: N/A;    Brynda Greathouse, MS RD LDN Clinical Inpatient Dietitian Weekend/After hours pager: [phone]

## 2014-11-30 NOTE — Progress Notes (Signed)
Patient Name: Cindy [name]      SUBJECTIVE admitted with hip fracture and underwent repair  No chest pain or dyspnea  Hyperthophic heart disease, CAD s/p NSTEMI tx'd medically (2014), HTN, HLD, PAF not on AC d/t high fall risk, mild AS, mod-sev MR, pulm HTN, chronic diastolic CHF (EF 19-41%)   Past Medical History  Diagnosis Date  . Atrial fibrillation 04/23-24/2007    a. recurrent PAF with RVR in September 2013. b. Evaluated 03/2013, previously intolerant to Norpace and Amiodarone - consider Multaq if recurs. c. Not on anticoag due to history of falls and also some internal bleeding per son.  . Hypertrophic cardiomyopathy     i am not sure SK 2015  . Urinary incontinence   . Diverticulosis of colon (without mention of hemorrhage) 2003/ 08/2000    EGD/colonoscopy Barretts esophagus//H.H divertics 08/2000  . Cervical mass     C2 lateral mass fracture  . Hypertension   . HLD (hyperlipidemia)     219/497  . Hypothyroidism   . Multinodular goiter (nontoxic)   . Osteoporosis   . Jaundice ~ 1935    "in grade school"  . Degenerative joint disease   . Depression   . Personal history of colonic polyps 02/29/2012    tubular adenoma  . Barrett's esophagus   . CAD (coronary artery disease)     a. NSTEMI 03/2013: 2/2 diagonal disease (small, not amenable to PCI, for med rx).  . Moderate mitral regurgitation 2014  . Mild aortic stenosis 2014  . HOH (hard of hearing)   . Anemia, iron deficiency   . GERD (gastroesophageal reflux disease)   . Legally blind     "both eyes/Dr. Zigmund Daniel" (11/11/2014)  . Macular degeneration of both eyes     Scheduled Meds:  Scheduled Meds: . amiodarone  200 mg Oral BID  . aspirin  81 mg Oral Daily  . docusate sodium  100 mg Oral BID  . enoxaparin (LOVENOX) injection  30 mg Subcutaneous Q24H  . ferrous fumarate  1 tablet Oral BID  . fluticasone  1 spray Each Nare Daily  . furosemide  40 mg Oral BID  . levothyroxine  25 mcg Oral QAC  breakfast  . losartan  25 mg Oral Daily  . metoprolol tartrate  12.5 mg Oral BID  . mirtazapine  15 mg Oral QHS  . pantoprazole  40 mg Oral Daily  . senna  1 tablet Oral BID   Continuous Infusions: . lactated ringers 10 mL/hr at 11/28/14 1813   acetaminophen **OR** acetaminophen, diltiazem, docusate sodium, HYDROcodone-acetaminophen, HYDROmorphone (DILAUDID) injection, ipratropium-albuterol, menthol-cetylpyridinium **OR** phenol, metoCLOPramide **OR** metoCLOPramide (REGLAN) injection, ondansetron **OR** ondansetron (ZOFRAN) IV, oxyCODONE, polyethylene glycol    PHYSICAL EXAM Filed Vitals:   11/29/14 0628 11/29/14 1400 11/29/14 2100 11/30/14 0449  BP: 117/46 138/111 128/45 144/31  Pulse: 62 79 80 68  Temp: 97.6 F (36.4 C) 98.3 F (36.8 C) 97.9 F (36.6 C) 98.6 F (37 C)  TempSrc: Oral  Oral Oral  Resp: 16 15 14 16   Height:      Weight:      SpO2: 100%  91% 97%    Well developed frial in no acute distress HENT normal Neck supple  Chest CTA Regular rate and rhythm, 2/6 systolic murmur LSB; 3/6 systolic murmur apex Abd-soft NT, ND No edema Skin-warm and dry Neuro-A & Oriented  Moves all ext   TELEMETRY: Reviewed telemetry pt in nsr    Intake/Output Summary (  Last 24 hours) at 11/30/14 0935 Last data filed at 11/30/14 0758  Gross per 24 hour  Intake    720 ml  Output   1550 ml  Net   -830 ml    LABS: Basic Metabolic Panel:  Recent Labs Lab 11/28/14 0919 11/29/14 0755 11/30/14 0518  NA 134* 132* 129*  K 4.4 4.8 3.8  CL 98 94* 94*  CO2 28 29 25   GLUCOSE 109* 109* 88  BUN 13 14 12   CREATININE 1.01 1.05 1.10  CALCIUM 8.7 8.2* 8.1*   Cardiac Enzymes:  Recent Labs  11/28/14 0902  TROPONINI 0.06*   CBC:  Recent Labs Lab 11/28/14 0919 11/29/14 0755 11/30/14 0518  WBC 7.1 9.0 7.2  NEUTROABS 4.9  --   --   HGB 9.0* 11.4* 9.8*  HCT 31.9* 36.7 32.1*  MCV 76.5* 77.8* 78.1  PLT 223 179 175   Liver Function Tests:  Recent Labs   11/28/14 0919  AST 25  ALT 19  ALKPHOS 47  BILITOT 0.6  PROT 5.5*  ALBUMIN 3.1*   BNP: BNP (last 3 results)  Recent Labs  08/19/14 1117 09/16/14 1339 11/11/14 1028  PROBNP 1807.0* 2382.0* 2417.0*     ASSESSMENT AND PLAN:  Principal Problem:   Intertrochanteric fracture of left hip Active Problems:   Essential hypertension   CAD (coronary artery disease)   PAF (paroxysmal atrial fibrillation)   Chronic diastolic CHF (congestive heart failure)   Pulmonary hypertension   Closed left hip fracture   Fall   Surgery, other elective  Patient remains in sinus rhythm today. Continue amiodarone (change to 200 mg po daily) and metoprolol. Continue aspirin. Not a candidate for anticoagulation given history of falls. Continue present dose of Lasix as patient euvolemic. Cardiology will sign off. Please call with questions.  Signed, Kirk Ruths MD  11/30/2014

## 2014-11-30 NOTE — Evaluation (Signed)
Physical Therapy Evaluation Patient Details Name: Cindy [name] MRN: [id number] DOB: 07/22/1922 Today's Date: 11/30/2014   History of Present Illness  80 yo admitted after a fall in SNF with resulting L femoral neck fracture, PMHx:  PAF, demand ischemia, CAD, SOB., confusion, incontinent, legally blind  Clinical Impression  Pt was evaluated with her new caregiver at home in to visit.  Pt is lethargic from fatigue and nursing had just determined she needs to get back to bed after assisting her there.  Pt has some difficulty assisting PT to get to bed due to length of time she has been getting weaker and has lost flexibility.  Plan is to return to SNF where she was staying when she fell.    Follow Up Recommendations SNF;Supervision/Assistance - 24 hour    Equipment Recommendations  None recommended by PT    Recommendations for Other Services       Precautions / Restrictions Precautions Precautions: Fall Restrictions Weight Bearing Restrictions: Yes LLE Weight Bearing: Weight bearing as tolerated      Mobility  Bed Mobility Overal bed mobility: Needs Assistance Bed Mobility: Sit to Supine       Sit to supine: Total assist   General bed mobility comments: tired from being up in chair  Transfers Overall transfer level: Needs assistance Equipment used: 1 person hand held assist (Pt too lethargic to use walker) Transfers: Sit to/from [employer] Transfers Sit to Stand: Total assist;Max assist Stand pivot transfers: Max assist;Total assist       General transfer comment: Pt made minimal effort to help and had poor support on LE's  Ambulation/Gait             General Gait Details: unable to step  Stairs            Wheelchair Mobility    Modified Rankin (Stroke Patients Only)       Balance Overall balance assessment: Needs assistance Sitting-balance support: Bilateral upper extremity supported Sitting balance-Leahy Scale: Poor   Postural  control: Posterior lean Standing balance support: Bilateral upper extremity supported Standing balance-Leahy Scale: Zero Standing balance comment: Pt made no effort to help support on LE's and let PT assist her pivot to chair                             Pertinent Vitals/Pain Pain Assessment: Faces Faces Pain Scale: Hurts even more Pain Location: L thigh Pain Intervention(s): Limited activity within patient's tolerance;Monitored during session;Premedicated before session;Repositioned    Home Living Family/patient expects to be discharged to:: Skilled nursing facility Living Arrangements: Alone               Additional Comments: children check on her, assist w/ meals from time to time.    Prior Function Level of Independence: Independent         Comments: Pt reports an aide coming in a few hours each morning to assist with ADLs and some IADLs as needed. Pt ambulates (I) for household, uses w/c for long distances and occasionally uses rollator when feeling stiff. No falls in the last year. Family checks on her a few days/week.     Hand Dominance   Dominant Hand: Right    Extremity/Trunk Assessment   Upper Extremity Assessment: Generalized weakness RUE Deficits / Details: previous shoulder issues         Lower Extremity Assessment: Generalized weakness;LLE deficits/detail   LLE Deficits / Details: new IM nailing from  mechanical fall due to confusion  Cervical / Trunk Assessment: Kyphotic  Communication   Communication: HOH  Cognition Arousal/Alertness: Lethargic Behavior During Therapy: Agitated;Anxious Overall Cognitive Status: Impaired/Different from baseline Area of Impairment: Following commands;Safety/judgement;Awareness;Problem solving;Attention   Current Attention Level: Divided Memory: Decreased recall of precautions;Decreased short-term memory Following Commands: Follows one step commands inconsistently Safety/Judgement: Decreased  awareness of safety;Decreased awareness of deficits Awareness: Anticipatory;Intellectual Problem Solving: Slow processing;Decreased initiation;Difficulty sequencing;Requires verbal cues;Requires tactile cues General Comments: Pt had poor cognition the entire session with concern about her functional level.  Caregiver present who has been with her more recently only and she reports pt was independent with gait     General Comments General comments (skin integrity, edema, etc.): Pt has been up in chair until she is tired, but is having some vestibular concerns per MD.  No indication she is already assessed    Exercises        Assessment/Plan    PT Assessment Patient needs continued PT services  PT Diagnosis Difficulty walking;Generalized weakness   PT Problem List Decreased strength;Decreased range of motion;Decreased activity tolerance;Decreased balance;Decreased mobility;Decreased coordination;Decreased cognition;Decreased knowledge of use of DME;Decreased safety awareness;Decreased knowledge of precautions;Decreased skin integrity;Pain;Cardiopulmonary status limiting activity  PT Treatment Interventions DME instruction;Gait training;Functional mobility training;Therapeutic activities;Therapeutic exercise;Balance training;Patient/family education   PT Goals (Current goals can be found in the Care Plan section) Acute Rehab PT Goals Patient Stated Goal: none stated PT Goal Formulation: Patient unable to participate in goal setting Time For Goal Achievement: 12/14/14 Potential to Achieve Goals: Fair    Frequency Min 3X/week   Barriers to discharge Decreased caregiver support;Inaccessible home environment      Co-evaluation               End of Session   Activity Tolerance: Patient limited by fatigue;Patient limited by lethargy Patient left: in bed;with call bell/phone within reach;with bed alarm set;with family/visitor present Nurse Communication: Mobility status          Time: 9147-8295 PT Time Calculation (min) (ACUTE ONLY): 29 min   Charges:   PT Evaluation $Initial PT Evaluation Tier I: 1 Procedure PT Treatments $Therapeutic Activity: 8-22 mins   PT G Codes:        Ramond Dial 12-10-2014, 4:17 PM   Mee Hives, PT MS Acute Rehab Dept. Number: [phone]

## 2014-11-30 NOTE — Progress Notes (Signed)
[facility] PROGRESS NOTE   Cindy [name] DDU:[id number] DOB: 09-Jul-1922 DOA: 11/28/2014 PCP: Elsie Stain, MD  HPI/Subjective: Denies significant pain. No shortness of breath.  Assessment/Plan: Principal Problem:   Intertrochanteric fracture of left hip Active Problems:   Essential hypertension   CAD (coronary artery disease)   PAF (paroxysmal atrial fibrillation)   Chronic diastolic CHF (congestive heart failure)   Pulmonary hypertension   Closed left hip fracture   Fall   Surgery, other elective    Left hip intertrochanteric fracture Sustained left hip intertrochanteric fracture after mechanical fall. Orthopedics consulted, open reduction internal fixation with left trochanteric hip nail done by Dr. Lorin Mercy. PT/OT ordered. Weightbearing as tolerated per orthopedics. Pain is controlled with narcotics. DVT prophylaxis with subcutaneous Lovenox.  Paroxysmal atrial fibrillation Rate is controlled, continue home medications. Patient not on anticoagulation, given her recurrent falls.  Chronic diastolic CHF No symptoms or signs of decompensation, appears euvolemic. Per cardiology continue same dose of Lasix.  Pulmonary hypertension Stable, no shortness of breath.  Essential hypertension/CAD Continue home medications, ARB held for 1 day restart. Continue statin and aspirin, no chest pain after surgery.  Hyponatremia Acute on chronic, likely secondary to the IV fluids given with surgery. Patient back on her home dose of Lasix, hopefully this will improve.  Code Status: DO NOT RESUSCITATE Family Communication: Plan discussed with the patient. Disposition Plan: Remains inpatient   Consultants:  Orthopedics.  Cardiology  Procedures:  ORIF with left trochanteric hip nail done by Dr. Lorin Mercy on 12/24.  Antibiotics:   perioperative   Objective: Filed Vitals:   11/30/14 0449  BP: 144/31  Pulse: 68  Temp: 98.6 F (37 C)  Resp: 16     Intake/Output Summary (Last 24 hours) at 11/30/14 0946 Last data filed at 11/30/14 0758  Gross per 24 hour  Intake    720 ml  Output   1550 ml  Net   -830 ml   Filed Weights   11/28/14 1326  Weight: 56.1 kg (123 lb 10.9 oz)    Exam: General: Alert and awake, oriented x3, not in any acute distress. HEENT: anicteric sclera, pupils reactive to light and accommodation, EOMI CVS: S1-S2 clear, no murmur rubs or gallops Chest: clear to auscultation bilaterally, no wheezing, rales or rhonchi Abdomen: soft nontender, nondistended, normal bowel sounds, no organomegaly Extremities: no cyanosis, clubbing or edema noted bilaterally Neuro: Cranial nerves II-XII intact, no focal neurological deficits  Data Reviewed: Basic Metabolic Panel:  Recent Labs Lab 11/28/14 0919 11/29/14 0755 11/30/14 0518  NA 134* 132* 129*  K 4.4 4.8 3.8  CL 98 94* 94*  CO2 28 29 25   GLUCOSE 109* 109* 88  BUN 13 14 12   CREATININE 1.01 1.05 1.10  CALCIUM 8.7 8.2* 8.1*   Liver Function Tests:  Recent Labs Lab 11/28/14 0919  AST 25  ALT 19  ALKPHOS 58  BILITOT 0.6  PROT 5.5*  ALBUMIN 3.1*   No results for input(s): LIPASE, AMYLASE in the last 168 hours. No results for input(s): AMMONIA in the last 168 hours. CBC:  Recent Labs Lab 11/28/14 0919 11/29/14 0755 11/30/14 0518  WBC 7.1 9.0 7.2  NEUTROABS 4.9  --   --   HGB 9.0* 11.4* 9.8*  HCT 31.9* 36.7 32.1*  MCV 76.5* 77.8* 78.1  PLT 223 179 175   Cardiac Enzymes:  Recent Labs Lab 11/28/14 0902  TROPONINI 0.06*   BNP (last 3 results)  Recent Labs  08/19/14 1117 09/16/14 1339 11/11/14 1028  PROBNP  1807.0* 2382.0* 2417.0*   CBG: No results for input(s): GLUCAP in the last 168 hours.  Micro No results found for this or any previous visit (from the past 240 hour(s)).   Studies: Dg Chest 1 View  11/28/2014   CLINICAL DATA:  Fall today.  Initial encounter.  EXAM: CHEST - 1 VIEW  COMPARISON:  11/16/2014 and 11/13/2014  radiographs. Chest CT 06/28/2010.  FINDINGS: 0928 hr. There is stable cardiomegaly, aortic atherosclerosis and retrocardiac density consistent with a hiatal hernia. There is chronic vascular congestion and left lower lobe scarring. No edema, confluent airspace opacity or pleural effusion is seen. Old rib fractures are present on the left. There is a mild thoracolumbar scoliosis. Postsurgical changes are present at both shoulders with chronic narrowing of the subacromial space on the right. No acute osseous findings evident.  IMPRESSION: No acute cardiopulmonary process. Stable cardiomegaly and chronic vascular congestion.   Electronically Signed   By: Camie Patience M.D.   On: 11/28/2014 10:05   Dg Elbow Complete Right  11/28/2014   CLINICAL DATA:  Fall today with posterior elbow bruising. Initial encounter.  EXAM: RIGHT ELBOW - COMPLETE 3+ VIEW  COMPARISON:  None.  FINDINGS: The bones appear mildly demineralized. There is no evidence acute fracture, dislocation or elbow joint effusion. There are mild degenerative changes with mild spurring of the coronoid process and lateral humeral epicondyle. There is mild dorsal soft tissue swelling. No foreign bodies are demonstrated.  IMPRESSION: No acute osseous findings. Osteopenia and mild degenerative changes noted.   Electronically Signed   By: Camie Patience M.D.   On: 11/28/2014 10:08   Dg Hip Complete Left  11/28/2014   CLINICAL DATA:  Fall today.  Left hip pain.  Initial encounter  EXAM: LEFT HIP - COMPLETE 2+ VIEW  COMPARISON:  None.  FINDINGS: Acute fracture of the lower femoral neck on the left with displacement and angulation. Hip joint space is normal. No other fracture in the pelvis. Right hip joint is normal.  IMPRESSION: Left femoral neck fracture.   Electronically Signed   By: Franchot Gallo M.D.   On: 11/28/2014 10:06   Dg Hip Operative Left  11/28/2014   CLINICAL DATA:  ORIF LEFT hip  EXAM: OPERATIVE LEFT HIP  COMPARISON:  Two digital C-arm  fluoroscopic images obtained intraoperatively are compared to preoperative study of 11/28/2014  FINDINGS: IM nail with 2 screws placed across the previously identified LEFT femoral neck fracture.  No dislocation.  Bones appear demineralized.  Distal extent of hardware not imaged.  IMPRESSION: Post ORIF LEFT femoral fracture.   Electronically Signed   By: Lavonia Dana M.D.   On: 11/28/2014 17:15    Scheduled Meds: . amiodarone  200 mg Oral Daily  . aspirin  81 mg Oral Daily  . docusate sodium  100 mg Oral BID  . enoxaparin (LOVENOX) injection  30 mg Subcutaneous Q24H  . ferrous fumarate  1 tablet Oral BID  . fluticasone  1 spray Each Nare Daily  . furosemide  40 mg Oral BID  . levothyroxine  25 mcg Oral QAC breakfast  . losartan  25 mg Oral Daily  . metoprolol tartrate  12.5 mg Oral BID  . mirtazapine  15 mg Oral QHS  . pantoprazole  40 mg Oral Daily  . senna  1 tablet Oral BID   Continuous Infusions: . lactated ringers 10 mL/hr at 11/28/14 1813       Time spent: 35 minutes    Nikoloz Huy A  [facility] Pager [phone] If 7PM-7AM, please contact night-coverage at [email or web address], password [facility] 11/30/2014, 9:46 AM  LOS: 2 days

## 2014-11-30 NOTE — Plan of Care (Signed)
Problem: Acute Rehab PT Goals(only PT should resolve) Goal: Pt Will Transfer Bed To Chair/Chair To Bed On LRAD

## 2014-12-01 DIAGNOSIS — I2510 Atherosclerotic heart disease of native coronary artery without angina pectoris: Secondary | ICD-10-CM

## 2014-12-01 LAB — CBC
HCT: 31.1 % — ABNORMAL LOW (ref 36.0–46.0)
Hemoglobin: 9.5 g/dL — ABNORMAL LOW (ref 12.0–15.0)
MCH: 24.4 pg — AB (ref 26.0–34.0)
MCHC: 30.5 g/dL (ref 30.0–36.0)
MCV: 79.9 fL (ref 78.0–100.0)
PLATELETS: 191 10*3/uL (ref 150–400)
RBC: 3.89 MIL/uL (ref 3.87–5.11)
RDW: 18.4 % — AB (ref 11.5–15.5)
WBC: 7.2 10*3/uL (ref 4.0–10.5)

## 2014-12-01 LAB — TYPE AND SCREEN
ABO/RH(D): A NEG
Antibody Screen: POSITIVE
DAT, IgG: NEGATIVE
UNIT DIVISION: 0
Unit division: 0

## 2014-12-01 LAB — BASIC METABOLIC PANEL
ANION GAP: 9 (ref 5–15)
BUN: 15 mg/dL (ref 6–23)
CALCIUM: 7.8 mg/dL — AB (ref 8.4–10.5)
CO2: 26 mmol/L (ref 19–32)
Chloride: 94 mEq/L — ABNORMAL LOW (ref 96–112)
Creatinine, Ser: 1.01 mg/dL (ref 0.50–1.10)
GFR calc Af Amer: 55 mL/min — ABNORMAL LOW (ref >90)
GFR, EST NON AFRICAN AMERICAN: 48 mL/min — AB (ref >90)
Glucose, Bld: 112 mg/dL — ABNORMAL HIGH (ref 70–99)
Potassium: 3.4 mmol/L — ABNORMAL LOW (ref 3.5–5.1)
Sodium: 129 mmol/L — ABNORMAL LOW (ref 135–145)

## 2014-12-01 MED ORDER — POTASSIUM CHLORIDE CRYS ER 20 MEQ PO TBCR
40.0000 meq | EXTENDED_RELEASE_TABLET | Freq: Four times a day (QID) | ORAL | Status: AC
  Administered 2014-12-01 (×2): 40 meq via ORAL
  Filled 2014-12-01 (×2): qty 2

## 2014-12-01 MED ORDER — ENOXAPARIN SODIUM 30 MG/0.3ML ~~LOC~~ SOLN: 30.0000 mg | SUBCUTANEOUS | Status: DC

## 2014-12-01 NOTE — Progress Notes (Signed)
Subjective: 3 Days Post-Op Procedure(s) (LRB): INTRAMEDULLARY  NAIL Left FEMORAL (Left) Patient reports pain as mild.   Less alert this AM falls asleep.  Objective: Vital signs in last 24 hours: Temp:  [97.9 F (36.6 [name])-98.3 F (36.8 [name])] 98.3 F (36.8 [name]) (12/27 0533) Pulse Rate:  [60-70] 60 (12/27 0533) Resp:  [16-18] 16 (12/27 0533) BP: (108-130)/(33-92) 121/38 mmHg (12/27 0533) SpO2:  [91 %-93 %] 91 % (12/27 0533)  Intake/Output from previous day: 12/26 0701 - 12/27 0700 In: 720 [P.O.:720] Out: 200 [Urine:200] Intake/Output this shift: Total I/O In: 120 [P.O.:120] Out: 300 [Urine:300]   Recent Labs  11/29/14 0755 11/30/14 0518 12/01/14 0513  HGB 11.4* 9.8* 9.5*    Recent Labs  11/30/14 0518 12/01/14 0513  WBC 7.2 7.2  RBC 4.11 3.89  HCT 32.1* 31.1*  PLT 175 191    Recent Labs  11/30/14 0518 12/01/14 0513  NA 129* 129*  K 3.8 3.4*  CL 94* 94*  CO2 25 26  BUN 12 15  CREATININE 1.10 1.01  GLUCOSE 88 112*  CALCIUM 8.1* 7.8*   No results for input(s): LABPT, INR in the last 72 hours.  LL equal good pulses.   Assessment/Plan: 3 Days Post-Op Procedure(s) (LRB): INTRAMEDULLARY  NAIL Left FEMORAL (Left) Up with therapy  Cindy [name] 12/01/2014, 9:26 AM

## 2014-12-01 NOTE — Progress Notes (Signed)
Physical Therapy Treatment Patient Details Name: Cindy [name] MRN: [id number] DOB: 02-08-1922 Today's Date: 12/01/2014    History of Present Illness 80 yo admitted after a fall in SNF with resulting L femoral neck fracture, PMHx:  PAF, demand ischemia, CAD, SOB., confusion, incontinent, legally blind    PT Comments    Difficulty accepting any weight onto LLE due to pain; required assist to advance LLE during bouts of gait training; noted good effort with sit to stand  Follow Up Recommendations  SNF;Supervision/Assistance - 24 hour     Equipment Recommendations  None recommended by PT    Recommendations for Other Services       Precautions / Restrictions Precautions Precautions: Fall Restrictions LLE Weight Bearing: Weight bearing as tolerated    Mobility  Bed Mobility                  Transfers Overall transfer level: Needs assistance Equipment used: Rolling walker (2 wheeled) Transfers: Sit to/from Stand Sit to Stand: Mod assist         General transfer comment: Required tactile cueing to intiate sit to stand, but noted good effort once center of mass was translated over feet; 2 reps of sit to stand  Ambulation/Gait Ambulation/Gait assistance: +2 physical assistance;Mod assist Ambulation Distance (Feet): 6 Feet (2 bout s of 3 ft) Assistive device: Rolling walker (2 wheeled) Gait Pattern/deviations: Decreased step length - right;Decreased step length - left;Trunk flexed;Shuffle     General Gait Details: small , shuffled steps and pt tending to let RW waay too far ahead of her; pulled chair behind her   Stairs            Wheelchair Mobility    Modified Rankin (Stroke Patients Only)       Balance                                    Cognition Arousal/Alertness: Lethargic Behavior During Therapy: WFL for tasks assessed/performed;Anxious Overall Cognitive Status: Impaired/Different from baseline Area of Impairment:  Following commands;Safety/judgement;Awareness;Problem solving;Attention   Current Attention Level: Sustained Memory: Decreased recall of precautions;Decreased short-term memory Following Commands: Follows one step commands inconsistently Safety/Judgement: Decreased awareness of safety;Decreased awareness of deficits   Problem Solving: Slow processing;Decreased initiation;Difficulty sequencing;Requires verbal cues;Requires tactile cues      Exercises Total Joint Exercises Quad Sets: AROM;Left;10 reps    General Comments        Pertinent Vitals/Pain Pain Assessment: Faces Pain Score: 6  Faces Pain Scale: Hurts even more Pain Location: L thigh Pain Descriptors / Indicators: Grimacing Pain Intervention(s): Limited activity within patient's tolerance    Home Living                      Prior Function            PT Goals (current goals can now be found in the care plan section) Acute Rehab PT Goals Patient Stated Goal: none stated PT Goal Formulation: Patient unable to participate in goal setting Time For Goal Achievement: 12/14/14 Potential to Achieve Goals: Fair Progress towards PT goals: Progressing toward goals (slowly)    Frequency  Min 3X/week    PT Plan Current plan remains appropriate    Co-evaluation             End of Session Equipment Utilized During Treatment: Gait belt Activity Tolerance: Patient limited by fatigue;Patient limited by lethargy Patient  left: in chair;with call bell/phone within reach (MD in to examine pt)     Time: 0901-0916 PT Time Calculation (min) (ACUTE ONLY): 15 min  Charges:  $Therapeutic Activity: 8-22 mins                    G Codes:      Roney Marion [facility] 12/01/2014, 11:00 AM  Roney Marion, [place]  Acute Rehabilitation Services Pager [phone] Office [phone]

## 2014-12-01 NOTE — Progress Notes (Signed)
[facility] PROGRESS NOTE   Cindy [name] EPP:[id number] DOB: 02-09-1922 DOA: 11/28/2014 PCP: Elsie Stain, MD  HPI/Subjective: Seen just after she finished with physical therapy, with complaining about severe pain.  Assessment/Plan: Principal Problem:   Intertrochanteric fracture of left hip Active Problems:   Essential hypertension   CAD (coronary artery disease)   PAF (paroxysmal atrial fibrillation)   Chronic diastolic CHF (congestive heart failure)   Pulmonary hypertension   Closed left hip fracture   Fall   Surgery, other elective    Left hip intertrochanteric fracture Sustained left hip intertrochanteric fracture after mechanical fall. Orthopedics consulted, open reduction internal fixation with left trochanteric hip nail done by Dr. Lorin Mercy. PT/OT ordered. Weightbearing as tolerated per orthopedics. Pain is controlled with narcotics. DVT prophylaxis with subcutaneous Lovenox.  Paroxysmal atrial fibrillation Rate is controlled, continue home medications. Patient not on anticoagulation, given her recurrent falls.  Chronic diastolic CHF No symptoms or signs of decompensation, appears euvolemic. Per cardiology continue same dose of Lasix.  Pulmonary hypertension Stable, no shortness of breath.  Essential hypertension/CAD Continue home medications, ARB held for 1 day restart. Continue statin and aspirin, no chest pain after surgery.  Hyponatremia Acute on chronic, likely secondary to the IV fluids given with surgery. Patient back on her home dose of Lasix, hopefully this will improve. Check BMP in a.m.  Code Status: DO NOT RESUSCITATE Family Communication: Plan discussed with the patient. Disposition Plan: Remains inpatient   Consultants:  Orthopedics.  Cardiology  Procedures:  ORIF with left trochanteric hip nail done by Dr. Lorin Mercy on 12/24.  Antibiotics:   perioperative   Objective: Filed Vitals:   12/01/14 0944  BP: 126/39    Pulse: 67  Temp:   Resp:     Intake/Output Summary (Last 24 hours) at 12/01/14 1122 Last data filed at 12/01/14 6063  Gross per 24 hour  Intake    600 ml  Output    300 ml  Net    300 ml   Filed Weights   11/28/14 1326  Weight: 56.1 kg (123 lb 10.9 oz)    Exam: General: Alert and awake, oriented x3, not in any acute distress. HEENT: anicteric sclera, pupils reactive to light and accommodation, EOMI CVS: S1-S2 clear, no murmur rubs or gallops Chest: clear to auscultation bilaterally, no wheezing, rales or rhonchi Abdomen: soft nontender, nondistended, normal bowel sounds, no organomegaly Extremities: no cyanosis, clubbing or edema noted bilaterally Neuro: Cranial nerves II-XII intact, no focal neurological deficits  Data Reviewed: Basic Metabolic Panel:  Recent Labs Lab 11/28/14 0919 11/29/14 0755 11/30/14 0518 12/01/14 0513  NA 134* 132* 129* 129*  K 4.4 4.8 3.8 3.4*  CL 98 94* 94* 94*  CO2 28 29 25 26   GLUCOSE 109* 109* 88 112*  BUN 13 14 12 15   CREATININE 1.01 1.05 1.10 1.01  CALCIUM 8.7 8.2* 8.1* 7.8*   Liver Function Tests:  Recent Labs Lab 11/28/14 0919  AST 25  ALT 19  ALKPHOS 58  BILITOT 0.6  PROT 5.5*  ALBUMIN 3.1*   No results for input(s): LIPASE, AMYLASE in the last 168 hours. No results for input(s): AMMONIA in the last 168 hours. CBC:  Recent Labs Lab 11/28/14 0919 11/29/14 0755 11/30/14 0518 12/01/14 0513  WBC 7.1 9.0 7.2 7.2  NEUTROABS 4.9  --   --   --   HGB 9.0* 11.4* 9.8* 9.5*  HCT 31.9* 36.7 32.1* 31.1*  MCV 76.5* 77.8* 78.1 79.9  PLT 223 179 175 191  Cardiac Enzymes:  Recent Labs Lab 11/28/14 0902  TROPONINI 0.06*   BNP (last 3 results)  Recent Labs  08/19/14 1117 09/16/14 1339 11/11/14 1028  PROBNP 1807.0* 2382.0* 2417.0*   CBG: No results for input(s): GLUCAP in the last 168 hours.  Micro No results found for this or any previous visit (from the past 240 hour(s)).   Studies: No results  found.  Scheduled Meds: . amiodarone  200 mg Oral Daily  . aspirin  81 mg Oral Daily  . docusate sodium  100 mg Oral BID  . enoxaparin (LOVENOX) injection  30 mg Subcutaneous Q24H  . ferrous fumarate  1 tablet Oral BID  . fluticasone  1 spray Each Nare Daily  . furosemide  40 mg Oral BID  . levothyroxine  25 mcg Oral QAC breakfast  . losartan  25 mg Oral Daily  . metoprolol tartrate  12.5 mg Oral BID  . mirtazapine  15 mg Oral QHS  . pantoprazole  40 mg Oral Daily  . potassium chloride  40 mEq Oral 4 times per day  . senna  1 tablet Oral BID   Continuous Infusions: . lactated ringers 10 mL/hr at 11/28/14 1813       Time spent: 35 minutes    [facility]  [facility] Pager [phone] If 7PM-7AM, please contact night-coverage at [email or web address], password [facility] 12/01/2014, 11:22 AM  LOS: 3 days

## 2014-12-01 NOTE — Discharge Summary (Signed)
Physician Discharge Summary  DASIE [name] EYC:[id number] DOB: [birth year] DOA: 11/28/2014  PCP: Elsie Stain, MD  Admit date: 11/28/2014 Discharge date: 12/02/2014  Time spent: 40 minutes  Recommendations for Outpatient Follow-up:  1. Follow-up with nursing home M.D. 2. Patient to be followed by palliative care in [place] home. Check CBC/BMP in 3 days 3. Continue Lovenox 30 mg subcutaneously for 4 more weeks for DVT prophylaxis post hip surgery.  Discharge Diagnoses:  Principal Problem:   Intertrochanteric fracture of left hip Active Problems:   Essential hypertension   CAD (coronary artery disease)   PAF (paroxysmal atrial fibrillation)   Chronic diastolic CHF (congestive heart failure)   Pulmonary hypertension   Closed left hip fracture   Fall   Surgery, other elective   Discharge Condition: Stable  Diet recommendation: Heart healthy  Filed Weights   11/28/14 1326  Weight: 56.1 kg (123 lb 10.9 oz)    History of present illness:  Cindy [name] is a 80 y.o. female  With complicated cardiac history including mitral valve regurgitation, CAD not amenable to PCI, CHF, PAF. Presented after an unwitnessed fall. Patient has had pain medication and is somewhat confused as such I am unable to confirm history with her. ED physician reports that patient was found after she was up. She had reported that she was sleepwalking although she had told personnel at nursing facility that she thought someone was knocking on the door.  While in the ED patient had x-ray of the left hip which reported left femoral neck fracture. Orthopedic surgeons consulted and we were consulted for medical evaluation recommendations  Hospital Course:   Left hip intertrochanteric fracture Sustained left hip intertrochanteric fracture after mechanical fall. Orthopedics consulted, open reduction internal fixation with left trochanteric hip nail done by Dr. Lorin Mercy. PT/OT ordered.  Weightbearing as tolerated per orthopedics. Pain is controlled with narcotics. DVT prophylaxis with subcutaneous Lovenox.  Paroxysmal atrial fibrillation Rate is controlled, continue home medications. Patient not on anticoagulation, given her recurrent falls.  Chronic diastolic CHF No symptoms or signs of decompensation, appears euvolemic. Per cardiology continue same dose of Lasix.  Pulmonary hypertension Stable, no shortness of breath.  Essential hypertension/CAD Continue home medications, ARB held for 1 day restart. Continue statin and aspirin, no chest pain after surgery.  Hyponatremia Acute on chronic, likely secondary to the IV fluids given with surgery. Patient back on her home dose of Lasix, hopefully this will improve.  Severe protein energy malnutrition Continue supplementation.  Goals of care Patient was recently in the hospital discharge on 11/18/2014 with palliative care follow-up. Restart palliative care service in the nursing home.  Procedures:  Open reduction internal fixation with left trochanteric hip nail done by Dr. Lorin Mercy on 11/28/2013  Consultations: Orthopedics. Cardiology  Discharge Exam: Filed Vitals:   12/02/14 1106  BP:   Pulse: 89  Temp:   Resp:    General: Alert and awake, oriented x3, not in any acute distress. HEENT: anicteric sclera, pupils reactive to light and accommodation, EOMI CVS: S1-S2 clear, no murmur rubs or gallops Chest: clear to auscultation bilaterally, no wheezing, rales or rhonchi Abdomen: soft nontender, nondistended, normal bowel sounds, no organomegaly Extremities: no cyanosis, clubbing or edema noted bilaterally Neuro: Cranial nerves II-XII intact, no focal neurological deficits  Discharge Instructions   Discharge Instructions    Diet - low sodium heart healthy    Complete by:  As directed      Increase activity slowly    Complete by:  As  directed           Current Discharge Medication List    START  taking these medications   Details  enoxaparin (LOVENOX) 30 MG/0.3ML injection Inject 0.3 mLs (30 mg total) into the skin daily. Qty: 0 Syringe      CONTINUE these medications which have CHANGED   Details  HYDROcodone-acetaminophen (NORCO/VICODIN) 5-325 MG per tablet Take 1 tablet by mouth every 6 (six) hours as needed for moderate pain. Qty: 10 tablet, Refills: 0    oxyCODONE (ROXICODONE INTENSOL) 20 MG/ML concentrated solution Administer 0.68ml every 2 hours as needed for respiratory distress/moderate to severe pain Qty: 15 mL, Refills: 0      CONTINUE these medications which have NOT CHANGED   Details  acetaminophen (TYLENOL) 325 MG tablet Take 2 tablets (650 mg total) by mouth every 6 (six) hours as needed for mild pain (or Fever >/= 101).    amiodarone (PACERONE) 200 MG tablet Take 1 tablet (200 mg total) by mouth 2 (two) times daily. Qty: 60 tablet, Refills: 0    aspirin 81 MG chewable tablet Chew 1 tablet (81 mg total) by mouth daily.    diltiazem (CARDIZEM) 30 MG tablet Take 1 tablet (30 mg total) by mouth every 6 (six) hours as needed (rapid heart rate.). Qty: 60 tablet, Refills: 0    docusate sodium (COLACE) 100 MG capsule Take 100 mg by mouth daily as needed for mild constipation.    ferrous fumarate (HEMOCYTE - 106 MG FE) 325 (106 FE) MG TABS tablet Take 1 tablet by mouth 2 (two) times daily.    fluticasone (FLONASE) 50 MCG/ACT nasal spray Place 1 spray into both nostrils daily. Qty: 1 g, Refills: 2    furosemide (LASIX) 40 MG tablet Take 1 tablet (40 mg total) by mouth 2 (two) times daily. Qty: 60 tablet, Refills: 0    guaiFENesin-dextromethorphan (ROBITUSSIN DM) 100-10 MG/5ML syrup Take 5 mLs by mouth every 4 (four) hours as needed for cough. Qty: 118 mL, Refills: 0    Hypromellose (ARTIFICIAL TEARS OP) Place 1 drop into both eyes 2 (two) times daily.    ipratropium-albuterol (DUONEB) 0.5-2.5 (3) MG/3ML SOLN Take 3 mLs by nebulization every 4 (four) hours as  needed. Qty: 360 mL, Refills: 0    levothyroxine (SYNTHROID, LEVOTHROID) 25 MCG tablet Take 1 tablet (25 mcg total) by mouth every morning. Qty: 90 tablet, Refills: 1    losartan (COZAAR) 25 MG tablet Take 25 mg by mouth daily.    !! metoprolol tartrate (LOPRESSOR) 25 MG tablet Take 12.5 mg by mouth 2 (two) times daily.    mirtazapine (REMERON) 15 MG tablet Take 15 mg by mouth at bedtime.    Multiple Vitamins-Minerals (HEALTHY EYES) TABS Take 1 tablet by mouth 2 (two) times daily.    nystatin cream (MYCOSTATIN) Apply 1 application topically daily.    omeprazole (PRILOSEC) 40 MG capsule Take 40 mg by mouth daily.    zinc oxide (BALMEX) 11.3 % CREA cream Apply 1 application topically daily.    zolpidem (AMBIEN) 5 MG tablet Take 1 tablet (5 mg total) by mouth at bedtime as needed for sleep. Qty: 90 tablet, Refills: 1    !! metoprolol tartrate (LOPRESSOR) 12.5 mg TABS tablet Take 0.5 tablets (12.5 mg total) by mouth 2 (two) times daily. Qty: 60 each, Refills: 0     !! - Potential duplicate medications found. Please discuss with provider.     Allergies  Allergen Reactions  . Lipitor [Atorvastatin] Nausea Only  and Other (See Comments)    LFT elevation  . Morphine And Related Other (See Comments)    "drives me crazy" and hyperactivity  . Irbesartan Swelling  . Ramipril Swelling    REACTION: lips swelling  . Telmisartan-Hctz Other (See Comments)    REACTION: incontinence  . Amiodarone Other (See Comments)    'terrible headaches'  . Hydrochlorothiazide Other (See Comments)    hyponatremia  . Metoprolol Other (See Comments)    Headache, dizzy, "terribly sick"  . Other Other (See Comments)    ANTICOAGULANTS - not a candidate due to history of falls and bleeding  . Tikosyn [Dofetilide] Other (See Comments)    Not a candidate due to Prolonged QT  . Norpace [Disopyramide] Other (See Comments)    Dry mouth   Follow-up Information    Follow up with Elsie Stain, MD In 1 week.    Specialty:  Family Medicine   Contact information:   [place] [id number] [phone]        The results of significant diagnostics from this hospitalization (including imaging, microbiology, ancillary and laboratory) are listed below for reference.    Significant Diagnostic Studies: Dg Chest 1 View  11/28/2014   CLINICAL DATA:  Fall today.  Initial encounter.  EXAM: CHEST - 1 VIEW  COMPARISON:  11/16/2014 and 11/13/2014 radiographs. Chest CT 06/28/2010.  FINDINGS: 0928 hr. There is stable cardiomegaly, aortic atherosclerosis and retrocardiac density consistent with a hiatal hernia. There is chronic vascular congestion and left lower lobe scarring. No edema, confluent airspace opacity or pleural effusion is seen. Old rib fractures are present on the left. There is a mild thoracolumbar scoliosis. Postsurgical changes are present at both shoulders with chronic narrowing of the subacromial space on the right. No acute osseous findings evident.  IMPRESSION: No acute cardiopulmonary process. Stable cardiomegaly and chronic vascular congestion.   Electronically Signed   By: Camie Patience M.D.   On: 11/28/2014 10:05   Dg Chest 2 View  11/16/2014   CLINICAL DATA:  Evaluate for pneumonia.  EXAM: CHEST  2 VIEW  COMPARISON:  11/13/2014 and CT 06/28/2010  FINDINGS: Lungs are adequately inflated with minimal blunting of the left costophrenic angle which may represent minimal atelectasis or small amount of pleural fluid. Evidence of patient's known hiatal hernia in the left retrocardiac region. Stable cardiomegaly. Remainder the exam is unchanged.  IMPRESSION: Minimal blunting of the left costophrenic angle which may be due to small amount of pleural fluid versus atelectasis.  Hiatal hernia.  Mild cardiomegaly.   Electronically Signed   By: Marin Olp M.D.   On: 11/16/2014 01:58   Dg Elbow Complete Right  11/28/2014   CLINICAL DATA:  Fall today with posterior elbow bruising.  Initial encounter.  EXAM: RIGHT ELBOW - COMPLETE 3+ VIEW  COMPARISON:  None.  FINDINGS: The bones appear mildly demineralized. There is no evidence acute fracture, dislocation or elbow joint effusion. There are mild degenerative changes with mild spurring of the coronoid process and lateral humeral epicondyle. There is mild dorsal soft tissue swelling. No foreign bodies are demonstrated.  IMPRESSION: No acute osseous findings. Osteopenia and mild degenerative changes noted.   Electronically Signed   By: Camie Patience M.D.   On: 11/28/2014 10:08   Dg Hip Complete Left  11/28/2014   CLINICAL DATA:  Fall today.  Left hip pain.  Initial encounter  EXAM: LEFT HIP - COMPLETE 2+ VIEW  COMPARISON:  None.  FINDINGS: Acute fracture of  the lower femoral neck on the left with displacement and angulation. Hip joint space is normal. No other fracture in the pelvis. Right hip joint is normal.  IMPRESSION: Left femoral neck fracture.   Electronically Signed   By: Franchot Gallo M.D.   On: 11/28/2014 10:06   Dg Hip Operative Left  11/28/2014   CLINICAL DATA:  ORIF LEFT hip  EXAM: OPERATIVE LEFT HIP  COMPARISON:  Two digital C-arm fluoroscopic images obtained intraoperatively are compared to preoperative study of 11/28/2014  FINDINGS: IM nail with 2 screws placed across the previously identified LEFT femoral neck fracture.  No dislocation.  Bones appear demineralized.  Distal extent of hardware not imaged.  IMPRESSION: Post ORIF LEFT femoral fracture.   Electronically Signed   By: Lavonia Dana M.D.   On: 11/28/2014 17:15   Dg Chest Port 1 View  11/13/2014   CLINICAL DATA:  Pneumonia.  EXAM: PORTABLE CHEST - 1 VIEW  COMPARISON:  11/11/2014.  FINDINGS: Mediastinum and hilar structures are normal. Cardiomegaly with pulmonary venous congestion bilateral pulmonary interstitial prominence with left-sided pleural effusion. The centric cyst with congestive heart failure. Similar finding noted on prior exam. No pneumothorax. No acute  osseus abnormality. Postsurgical changes both shoulders.  IMPRESSION: Congestive heart failure from interstitial edema. No significant interim change from prior exam .   Electronically Signed   By: Capron   On: 11/13/2014 07:25   Dg Chest Port 1 View  11/11/2014   CLINICAL DATA:  Respiratory distress and tachycardia today.  EXAM: PORTABLE CHEST - 1 VIEW  COMPARISON:  September 16, 2014  FINDINGS: The mediastinal contour is normal. The heart size is enlarged. There is increased interstitium in bilateral lung. There is no focal pneumonia. There is probably minimal bilateral pleural effusions. The visualized skeletal structures are stable P ower  IMPRESSION: Cardiomegaly with interstitial pulmonary edema.   Electronically Signed   By: Abelardo Diesel M.D.   On: 11/11/2014 11:05    Microbiology: No results found for this or any previous visit (from the past 240 hour(s)).   Labs: Basic Metabolic Panel:  Recent Labs Lab 11/28/14 0919 11/29/14 0755 11/30/14 0518 12/01/14 0513  NA 134* 132* 129* 129*  K 4.4 4.8 3.8 3.4*  CL 98 94* 94* 94*  CO2 28 29 25 26   GLUCOSE 109* 109* 88 112*  BUN 13 14 12 15   CREATININE 1.01 1.05 1.10 1.01  CALCIUM 8.7 8.2* 8.1* 7.8*   Liver Function Tests:  Recent Labs Lab 11/28/14 0919  AST 25  ALT 19  ALKPHOS 58  BILITOT 0.6  PROT 5.5*  ALBUMIN 3.1*   No results for input(s): LIPASE, AMYLASE in the last 168 hours. No results for input(s): AMMONIA in the last 168 hours. CBC:  Recent Labs Lab 11/28/14 0919 11/29/14 0755 11/30/14 0518 12/01/14 0513  WBC 7.1 9.0 7.2 7.2  NEUTROABS 4.9  --   --   --   HGB 9.0* 11.4* 9.8* 9.5*  HCT 31.9* 36.7 32.1* 31.1*  MCV 76.5* 77.8* 78.1 79.9  PLT 223 179 175 191   Cardiac Enzymes:  Recent Labs Lab 11/28/14 0902  TROPONINI 0.06*   BNP: BNP (last 3 results)  Recent Labs  08/19/14 1117 09/16/14 1339 11/11/14 1028  PROBNP 1807.0* 2382.0* 2417.0*   CBG: No results for input(s): GLUCAP in  the last 168 hours.     Signed:  Philmore Lepore A  [facility] 12/02/2014, 12:21 PM

## 2014-12-02 ENCOUNTER — Encounter (HOSPITAL_COMMUNITY): Payer: Self-pay | Admitting: Orthopaedic Surgery

## 2014-12-02 MED ORDER — OXYCODONE HCL 20 MG/ML PO CONC: ORAL | Status: DC

## 2014-12-02 MED ORDER — HYDROCODONE-ACETAMINOPHEN 5-325 MG PO TABS: 1.0000 | ORAL_TABLET | Freq: Four times a day (QID) | ORAL | Status: DC | PRN

## 2014-12-02 NOTE — Progress Notes (Signed)
Physical Therapy Treatment Patient Details Name: Cindy [name] MRN: [id number] DOB: Oct 27, 1922 Today's Date: 12/02/2014    History of Present Illness 80 yo admitted after a fall in SNF with resulting L femoral neck fracture, PMHx:  PAF, demand ischemia, CAD, SOB., confusion, incontinent, legally blind.  Pt s/p IM nailing on 11/28/14.     PT Comments    Pt is progressing nicely with her mobility and could now be a one person assist for stand and pivot with RW.  She would still be safer with two person assist with attempts at gait into the hallway due to needing O2 and chair to follow for safety and to encourage increased gait distance.  Pt did well being pre medicated today.  PT will continue to follow acutely until d/c to SNF can be arranged/MD deems she is ready.   Follow Up Recommendations  SNF     Equipment Recommendations  None recommended by PT    Recommendations for Other Services   NA     Precautions / Restrictions Precautions Precautions: Fall Precaution Comments: h/o falls Restrictions Weight Bearing Restrictions: Yes LLE Weight Bearing: Weight bearing as tolerated    Mobility             Transfers Overall transfer level: Needs assistance Equipment used: Rolling walker (2 wheeled) Transfers: Sit to/from Stand Sit to Stand: Min assist         General transfer comment: Min assist from [facility] using bil arms to push up from sitting.  BSC elevated, so likely easier than from the bed.  Pt with correct hand placement.   Ambulation/Gait Ambulation/Gait assistance: Min assist Ambulation Distance (Feet): 8 Feet Assistive device: Rolling walker (2 wheeled) Gait Pattern/deviations: Step-to pattern;Antalgic Gait velocity: decreased   General Gait Details: shuffling continues with antalgic component.  Pt was better able to stay inside of RW during short distance gait today.  Only able to walk a little further due to DOE on RA and prolonged standing for peri care and  donning of adult undergarment.           Balance Overall balance assessment: Needs assistance Sitting-balance support: Feet supported;No upper extremity supported Sitting balance-Leahy Scale: Fair   Postural control: Posterior lean Standing balance support: Bilateral upper extremity supported Standing balance-Leahy Scale: Poor Standing balance comment: needs support of RW and therapist in standing.  Unable to attempt her own peri care at this time.                     Cognition Arousal/Alertness: Awake/alert Behavior During Therapy: WFL for tasks assessed/performed Overall Cognitive Status: History of cognitive impairments - at baseline                      Exercises Total Joint Exercises Ankle Circles/Pumps: AROM;Both;10 reps;Supine;Seated Quad Sets: AROM;Left;10 reps;Supine Heel Slides: AAROM;Left;10 reps;Supine Hip ABduction/ADduction: AAROM;Left;10 reps;Supine Long Arc Quad: AROM;Both;10 reps;Seated    General Comments General comments (skin integrity, edema, etc.): O2 sats on RA 88% and DOE 3/4.  Pt's friend informed PT at the end of the session, she was using O2 24/7 at the SNF PTA.        Pertinent Vitals/Pain Pain Assessment: Faces Faces Pain Scale: Hurts little more Pain Location: left leg Pain Descriptors / Indicators: Grimacing Pain Intervention(s): Limited activity within patient's tolerance;Monitored during session;Premedicated before session;Repositioned           PT Goals (current goals can now be found in the care plan  section) Acute Rehab PT Goals Patient Stated Goal: to get back to moving around on her own like she was before.  Progress towards PT goals: Progressing toward goals    Frequency  Min 3X/week    PT Plan Current plan remains appropriate       End of Session Equipment Utilized During Treatment: Gait belt Activity Tolerance: Patient limited by fatigue;Patient limited by pain Patient left: in chair;with call  bell/phone within reach;with family/visitor present     Time: 1033-1101 PT Time Calculation (min) (ACUTE ONLY): 28 min  Charges:  $Therapeutic Exercise: 8-22 mins $Therapeutic Activity: 8-22 mins                      Dasja Brase B. Milany Geck, PT, DPT [phone]   12/02/2014, 11:14 AM

## 2014-12-02 NOTE — Anesthesia Postprocedure Evaluation (Signed)
  Anesthesia Post-op Note  Patient: Cindy [name]  Procedure(s) Performed: Procedure(s): INTRAMEDULLARY  NAIL Left FEMORAL (Left)  Patient Location: PACU  Anesthesia Type:General  Level of Consciousness: awake, alert , oriented and patient cooperative  Airway and Oxygen Therapy: Patient Spontanous Breathing  Post-op Pain: mild  Post-op Assessment: Post-op Vital signs reviewed, Patient's Cardiovascular Status Stable, Respiratory Function Stable, Patent Airway, No signs of Nausea or vomiting and Pain level controlled  Post-op Vital Signs: stable  Last Vitals:  Filed Vitals:   12/02/14 0552  BP: 132/39  Pulse: 67  Temp: 36.9 C  Resp: 17    Complications: No apparent anesthesia complications

## 2014-12-02 NOTE — Progress Notes (Signed)
Utilization review completed. Theoplis Garciagarcia, RN, BSN. 

## 2014-12-02 NOTE — Clinical Social Work Note (Signed)
Pt to return to [facility]. Pt and pt's caregiver updated at bedside.  Facility: [facility] Report number: [id number] Transportation: EMS Corey Harold, scheduled for 3pm per RN request)  Lubertha Sayres, [place] ([phone]) Licensed Clinical Social Worker Orthopedics [phone]) and Surgical [phone])

## 2014-12-02 NOTE — Progress Notes (Signed)
CARE MANAGEMENT NOTE 12/02/2014  Patient:  Cindy [name],Cindy [name]   Account Number:  [id number]  Date Initiated:  11/29/2014  Documentation initiated by:  Oliveras-Aizpurua,Jeannette  Subjective/Objective Assessment:   80 yo female admitted with a L femoral fx after falling at the nursing home. She had  L Femoral IM Nail.     Action/Plan:   Son spoke with CM regarding d/[name] plan.   Anticipated DC Date:  12/02/2014   Anticipated DC Plan:  SKILLED NURSING FACILITY  In-house referral  Clinical Social Worker      DC Planning Services  CM consult      [facility] Choice  NA   Choice offered to / List presented to:             Status of service:  Completed, signed off Medicare Important Message given?  YES (If response is "NO", the following Medicare IM given date fields will be blank) Date Medicare IM given:  12/02/2014 Medicare IM given by:  [facility] Date Additional Medicare IM given:   Additional Medicare IM given by:    Discharge Disposition:  [place]  Per UR Regulation:  Reviewed for med. necessity/level of care/duration of stay  If discussed at [place] of Stay Meetings, dates discussed:    Comments:  11/29/14 - [place], RN, BSN  Dischage plan is for pt to return to [facility]. Son stated that they are holding her bed. Informed son that SW will f/u.

## 2014-12-02 NOTE — Progress Notes (Signed)
Subjective: Doing well.  Pain controlled.  Being transferred to [facility] today.    Objective: Vital signs in last 24 hours: Temp:  [98.1 F (36.7 C)-98.4 F (36.9 C)] 98.4 F (36.9 C) (12/28 0552) Pulse Rate:  [67-89] 89 (12/28 1106) Resp:  [16-17] 17 (12/28 0552) BP: (129-132)/(31-39) 132/39 mmHg (12/28 0552) SpO2:  [88 %-100 %] 88 % (12/28 1106)  Intake/Output from previous day: 12/27 0701 - 12/28 0700 In: 480 [P.O.:480] Out: 300 [Urine:300] Intake/Output this shift:     Recent Labs  11/30/14 0518 12/01/14 0513  HGB 9.8* 9.5*    Recent Labs  11/30/14 0518 12/01/14 0513  WBC 7.2 7.2  RBC 4.11 3.89  HCT 32.1* 31.1*  PLT 175 191    Recent Labs  11/30/14 0518 12/01/14 0513  NA 129* 129*  K 3.8 3.4*  CL 94* 94*  CO2 25 26  BUN 12 15  CREATININE 1.10 1.01  GLUCOSE 88 112*  CALCIUM 8.1* 7.8*   No results for input(s): LABPT, INR in the last 72 hours.  Exam:  Wounds look good.  Staples intact.  No signs of infection.  Calf nontender, NVI.  Alert and oriented.   Assessment/Plan: Transfer to SNF today as scheduled. F/u with Dr Lorin Mercy 2 weeks postop.  Return sooner if needed.    Cindy [name] M 12/02/2014, 3:01 PM

## 2014-12-02 NOTE — Clinical Social Work Psychosocial (Signed)
Clinical Social Work Department BRIEF PSYCHOSOCIAL ASSESSMENT 12/02/2014  Patient:  Grigorian,Arvie C     Account Number:  [id number]     Admit date:  11/28/2014  Clinical Social Worker:  Delrae Sawyers  Date/Time:  12/02/2014 12:40 PM  Referred by:  Physician  Date Referred:  12/02/2014 Referred for  SNF Placement   Other Referral:   none.   Interview type:  Patient Other interview type:   Pt's friend, [place], present at bedside.    PSYCHOSOCIAL DATA Living Status:  ALONE Admitted from facility:   Level of care:   Primary support name:  [place] Primary support relationship to patient:  FRIEND Degree of support available:   Strong support system.    CURRENT CONCERNS Current Concerns  Post-Acute Placement   Other Concerns:   none.    SOCIAL WORK ASSESSMENT / PLAN CSW met with pt at bedside to discuss discharge disposition. Pt informed CSW pt admitted from [facility] and would like to return to complete short-term rehabilitation. [place] admissions liaison confirmed pt is able to return to [facility] at time of discharge. CSW to continue to follow and assist with discharge planning needs.   Assessment/plan status:  Psychosocial Support/Ongoing Assessment of Needs Other assessment/ plan:   none.   Information/referral to community resources:   Pt to return to [facility].    PATIENT'S/FAMILY'S RESPONSE TO PLAN OF CARE: Pt understanding and agreeable to CSW plan of care. Pt expressed no further questions or concerns at this time.       Lubertha Sayres, [place] ([phone]) Licensed Clinical Social Worker Orthopedics [phone]) and Surgical [phone])

## 2014-12-03 ENCOUNTER — Other Ambulatory Visit: Payer: Self-pay | Admitting: *Deleted

## 2014-12-03 ENCOUNTER — Encounter: Payer: Self-pay | Admitting: Registered Nurse

## 2014-12-03 ENCOUNTER — Non-Acute Institutional Stay (SKILLED_NURSING_FACILITY): Payer: Medicare Other | Admitting: Registered Nurse

## 2014-12-03 DIAGNOSIS — S72142D Displaced intertrochanteric fracture of left femur, subsequent encounter for closed fracture with routine healing: Secondary | ICD-10-CM

## 2014-12-03 DIAGNOSIS — E039 Hypothyroidism, unspecified: Secondary | ICD-10-CM

## 2014-12-03 DIAGNOSIS — M159 Polyosteoarthritis, unspecified: Secondary | ICD-10-CM

## 2014-12-03 DIAGNOSIS — I5032 Chronic diastolic (congestive) heart failure: Secondary | ICD-10-CM

## 2014-12-03 DIAGNOSIS — G4700 Insomnia, unspecified: Secondary | ICD-10-CM

## 2014-12-03 DIAGNOSIS — I480 Paroxysmal atrial fibrillation: Secondary | ICD-10-CM

## 2014-12-03 DIAGNOSIS — F329 Major depressive disorder, single episode, unspecified: Secondary | ICD-10-CM

## 2014-12-03 DIAGNOSIS — R5381 Other malaise: Secondary | ICD-10-CM

## 2014-12-03 DIAGNOSIS — D638 Anemia in other chronic diseases classified elsewhere: Secondary | ICD-10-CM

## 2014-12-03 DIAGNOSIS — I10 Essential (primary) hypertension: Secondary | ICD-10-CM

## 2014-12-03 DIAGNOSIS — K219 Gastro-esophageal reflux disease without esophagitis: Secondary | ICD-10-CM

## 2014-12-03 DIAGNOSIS — E871 Hypo-osmolality and hyponatremia: Secondary | ICD-10-CM

## 2014-12-03 DIAGNOSIS — F32A Depression, unspecified: Secondary | ICD-10-CM

## 2014-12-03 LAB — BASIC METABOLIC PANEL
BUN: 14 mg/dL (ref 4–21)
Creatinine: 0.8 mg/dL (ref 0.5–1.1)
GLUCOSE: 92 mg/dL
POTASSIUM: 4 mmol/L (ref 3.4–5.3)
SODIUM: 131 mmol/L — AB (ref 137–147)

## 2014-12-03 LAB — CBC AND DIFFERENTIAL
HCT: 36 % (ref 36–46)
Hemoglobin: 10.6 g/dL — AB (ref 12.0–16.0)
Platelets: 251 10*3/uL (ref 150–399)
WBC: 5.7 10^3/mL

## 2014-12-03 MED ORDER — ZOLPIDEM TARTRATE 5 MG PO TABS: 5.0000 mg | ORAL_TABLET | Freq: Every evening | ORAL | Status: DC | PRN

## 2014-12-03 NOTE — Progress Notes (Signed)
Patient ID: Cindy [name], female   DOB: [birth year], 80 y.o.   MRN: [id number]   Place of Service: [facility]  Allergies  Allergen Reactions  . Lipitor [Atorvastatin] Nausea Only and Other (See Comments)    LFT elevation  . Morphine And Related Other (See Comments)    "drives me crazy" and hyperactivity  . Irbesartan Swelling  . Ramipril Swelling    REACTION: lips swelling  . Telmisartan-Hctz Other (See Comments)    REACTION: incontinence  . Amiodarone Other (See Comments)    'terrible headaches'  . Hydrochlorothiazide Other (See Comments)    hyponatremia  . Metoprolol Other (See Comments)    Headache, dizzy, "terribly sick"  . Other Other (See Comments)    ANTICOAGULANTS - not a candidate due to history of falls and bleeding  . Tikosyn [Dofetilide] Other (See Comments)    Not a candidate due to Prolonged QT  . Norpace [Disopyramide] Other (See Comments)    Dry mouth    Code Status: DNR  Goals of Care: Comfort and Quality of Life/Palliative   Chief Complaint  Patient presents with  . Hospitalization Follow-up    HPI  80 y.o. female with PMH of paroxysmal afib, hypertrophic cardiomyopathy, UI, hypothyroidism, CHF, acute respiratory failure, depression, macular degeneration-legally blind in both eyes among others is being seen for a follow-up visit post hospital admission from 11/28/14 to 12/02/14 for left hip intertrochanteric fracture after a mechanical fall at the nursing home. She underwent ORIF with nailing of left hip. Prior to this, she was here for STR post hospital admission 11/11/14 to 11/18/14 for acute respiratory failure. Seen in room today. Caregiver at bedside. Per PT, patient did poorly with therapy today: O2 sat dropped in the 70s with minimal exertion on 2L via [place].  It took more than 5 minutes of resting for O2 sat to be >88% at 2L.   Review of Systems Constitutional: Negative for fever, chills, and fatigue.  HENT: Negative for ear pain,  congestion, and sore throat  Eyes: Negative for eye pain and eye discharge. Positive for vision impairment-legally blind  Cardiovascular: Negative for chest pain, palpitations, and leg swelling  Respiratory: Negative cough and wheezing. Positive for shortness of breath, especially with exertion (chronic).  Gastrointestinal: Negative for nausea and vomiting. Negative for abdominal pain, diarrhea and constipation. Positive for reflux  Genitourinary: Negative for dysuria and hematuria  Endocrine: Negative for polydipsia, polyphagia, and polyuria  Musculoskeletal: Positive for generalized pain from OA  Neurological: Negative for dizziness and headache  Skin: Negative for rash and wound.  Psychiatric: Negative for depression    Past Medical History  Diagnosis Date  . Atrial fibrillation 04/23-24/2007    a. recurrent PAF with RVR in September 2013. b. Evaluated 03/2013, previously intolerant to Norpace and Amiodarone - consider Multaq if recurs. c. Not on anticoag due to history of falls and also some internal bleeding per son.  . Hypertrophic cardiomyopathy     i am not sure SK 2015  . Urinary incontinence   . Diverticulosis of colon (without mention of hemorrhage) 2003/ 08/2000    EGD/colonoscopy Barretts esophagus//H.H divertics 08/2000  . Cervical mass     C2 lateral mass fracture  . Hypertension   . HLD (hyperlipidemia)     219/497  . Hypothyroidism   . Multinodular goiter (nontoxic)   . Osteoporosis   . Jaundice ~ 1935    "in grade school"  . Degenerative joint disease   . Depression   .  Personal history of colonic polyps 02/29/2012    tubular adenoma  . Barrett's esophagus   . CAD (coronary artery disease)     a. NSTEMI 03/2013: 2/2 diagonal disease (small, not amenable to PCI, for med rx).  . Moderate mitral regurgitation 2014  . Mild aortic stenosis 2014  . HOH (hard of hearing)   . Anemia, iron deficiency   . GERD (gastroesophageal reflux disease)   . Legally blind      "both eyes/Dr. Zigmund Daniel" (11/11/2014)  . Macular degeneration of both eyes     Past Surgical History  Procedure Laterality Date  . Bladder surgery  1990's    bladder tack early 90's  . Tear duct probing  07/29/03    tear duct surg  . Cystourethroscopy  09/17/03  . Rotator cuff repair Bilateral ? date; 09/07/05    left; right( Dr. Gladstone Lighter)  . Appendectomy  1941  . Thyroid ultrasound  10/14/2003    MNG, no dominant masses  . Doppler echocardiography  03/05/2002&09/11/2003    ECHO, EF wnl, mild stenosis, A.S. mild MR, Mild T.R03/31/2003//ECHO EF 70%,LVH, ?diast dysfunction 09/11/2003  . Cataract extraction, bilateral  2003  . Dilation and curettage of uterus  09/07/2000    endometrial polyps removed, path all benign   . Tonsillectomy and adenoidectomy      "as a child"  . Fracture surgery  2010    right knee  . Knee arthroscopy Right   . Reduction mammaplasty  1981  . Incontinence surgery      "she's had a tack and a sling"  . Left heart catheterization with coronary angiogram N/A 03/27/2013    Procedure: LEFT HEART CATHETERIZATION WITH CORONARY ANGIOGRAM;  Surgeon: Peter M Martinique, MD;  Location: [facility];  Service: Cardiovascular;  Laterality: N/A;  . Femur im nail Left 11/28/2014    Procedure: INTRAMEDULLARY  NAIL Left FEMORAL;  Surgeon: Marybelle Killings, MD;  Location: [facility];  Service: Orthopedics;  Laterality: Left;    History  Substance Use Topics  . Smoking status: Former Smoker -- 0.50 packs/day for 4 years    Types: Cigarettes    Quit date: 07/06/1974  . Smokeless tobacco: Never Used  . Alcohol Use: No    Family History  Problem Relation Age of Onset  . Heart failure Mother     CHF, DM, HBP  . Hypertension Mother   . Uterine cancer Mother   . Stroke Mother   . Colon cancer Neg Hx   . Esophageal cancer Neg Hx   . Rectal cancer Neg Hx   . Stomach cancer Neg Hx       Medication List       This list is accurate as of: 12/03/14 11:59 PM.  Always use your most  recent med list.               acetaminophen 325 MG tablet  Commonly known as:  TYLENOL  Take 2 tablets (650 mg total) by mouth every 6 (six) hours as needed for mild pain (or Fever >/= 101).     amiodarone 200 MG tablet  Commonly known as:  PACERONE  Take 1 tablet (200 mg total) by mouth 2 (two) times daily.     ARTIFICIAL TEARS OP  Place 1 drop into both eyes 2 (two) times daily.     aspirin 81 MG chewable tablet  Chew 1 tablet (81 mg total) by mouth daily.     diltiazem 30 MG tablet  Commonly known  as:  CARDIZEM  Take 1 tablet (30 mg total) by mouth every 6 (six) hours as needed (rapid heart rate.).     docusate sodium 100 MG capsule  Commonly known as:  COLACE  Take 100 mg by mouth daily as needed for mild constipation.     enoxaparin 30 MG/0.3ML injection  Commonly known as:  LOVENOX  Inject 0.3 mLs (30 mg total) into the skin daily.     fluticasone 50 MCG/ACT nasal spray  Commonly known as:  FLONASE  Place 1 spray into both nostrils daily.     furosemide 40 MG tablet  Commonly known as:  LASIX  Take 1 tablet (40 mg total) by mouth 2 (two) times daily.     guaiFENesin-dextromethorphan 100-10 MG/5ML syrup  Commonly known as:  ROBITUSSIN DM  Take 5 mLs by mouth every 4 (four) hours as needed for cough.     HEALTHY EYES Tabs  Take 1 tablet by mouth 2 (two) times daily.     HYDROcodone-acetaminophen 5-325 MG per tablet  Commonly known as:  NORCO/VICODIN  Take 1 tablet by mouth every 6 (six) hours as needed for moderate pain.     ipratropium-albuterol 0.5-2.5 (3) MG/3ML Soln  Commonly known as:  DUONEB  Take 3 mLs by nebulization every 4 (four) hours as needed.     levothyroxine 25 MCG tablet  Commonly known as:  SYNTHROID, LEVOTHROID  Take 1 tablet (25 mcg total) by mouth every morning.     LORazepam 1 MG tablet  Commonly known as:  ATIVAN  Take 1 mg by mouth every 6 (six) hours as needed for anxiety.     losartan 25 MG tablet  Commonly known as:   COZAAR  Take 25 mg by mouth daily.     metoprolol tartrate 25 MG tablet  Commonly known as:  LOPRESSOR  Take 12.5 mg by mouth 2 (two) times daily.     mirtazapine 15 MG tablet  Commonly known as:  REMERON  Take 15 mg by mouth at bedtime.     omeprazole 40 MG capsule  Commonly known as:  PRILOSEC  Take 40 mg by mouth daily.     oxyCODONE 20 MG/ML concentrated solution  Commonly known as:  ROXICODONE INTENSOL  Administer 0.59ml every 2 hours as needed for respiratory distress/moderate to severe pain     potassium chloride SA 20 MEQ tablet  Commonly known as:  K-DUR,KLOR-CON  Take 40 mEq by mouth daily.     zolpidem 5 MG tablet  Commonly known as:  AMBIEN  Take 1 tablet (5 mg total) by mouth at bedtime as needed for sleep.        Physical Exam  BP 142/86 mmHg  Pulse 100  Temp(Src) 97.2 F (36.2 C)  Resp 20  Ht 5' (1.524 m)  Wt 133 lb 11.2 oz (60.646 kg)  BMI 26.11 kg/m2  SpO2 91%  Constitutional: WDWN elderly female in no acute distress. Conversant and pleasant  HEENT: Normocephalic and atraumatic. PERRL. EOM intact. No icterus. Legally blind. Hard of hearing. No nasal discharge or sinus tenderness. Oral mucosa moist. Posterior pharynx clear of any exudate or lesions.  Neck: Supple and nontender. No lymphadenopathy, masses, or thyromegaly. No JVD or carotid bruits.  Cardiac: Normal S1, S2. RRR with 2/6 systolic mumur. Distal pulses intact. Trace dependent edema.  Lungs: No respiratory distress. Breath sounds diminished bilaterally with wheezes throughout. Oxygen via [place] in place  Abdomen: Audible bowel sounds in all quadrants. Soft, nontender, nondistended.  Musculoskeletal: able to move all extremities. Generalized weakness present.  Skin: Warm and dry. No rash noted. No erythema. Bruising noted around Left hip surgical incisions. No signs of infection.  Neurological: Alert and oriented to person, place, and time.  Psychiatric: Appropriate mood and affect.    Labs  Reviewed  CBC Latest Ref Rng 12/03/2014 12/01/2014 11/30/2014  WBC - 5.7 7.2 7.2  Hemoglobin 12.0 - 16.0 g/dL 10.6(A) 9.5(L) 9.8(L)  Hematocrit 36 - 46 % 36 31.1(L) 32.1(L)  Platelets 150 - 399 K/L 251 191 175    CMP Latest Ref Rng 12/03/2014 12/01/2014 11/30/2014  Glucose 70 - 99 mg/dL - 112(H) 88  BUN 4 - 21 mg/dL 14 15 12   Creatinine 0.5 - 1.1 mg/dL 0.8 1.01 1.10  Sodium 137 - 147 mmol/L 131(A) 129(L) 129(L)  Potassium 3.4 - 5.3 mmol/L 4.0 3.4(L) 3.8  Chloride 96 - 112 mEq/L - 94(L) 94(L)  CO2 19 - 32 mmol/L - 26 25  Calcium 8.4 - 10.5 mg/dL - 7.8(L) 8.1(L)  Total Protein 6.0 - 8.3 g/dL - - -  Total Bilirubin 0.3 - 1.2 mg/dL - - -  Alkaline Phos 39 - 117 U/L - - -  AST 0 - 37 U/L - - -  ALT 0 - 35 U/L - - -    No results found for: HGBA1C  Lab Results  Component Value Date   TSH 1.200 11/13/2014    Lipid Panel     Component Value Date/Time   CHOL 160 08/30/2012 0440   TRIG 77 08/30/2012 0440   HDL 62 08/30/2012 0440   CHOLHDL 2.6 08/30/2012 0440   VLDL 15 08/30/2012 0440   LDLCALC 83 08/30/2012 0440   LDLDIRECT 132.8 08/16/2007 1118   Diagnostic Studies Reviewed 11/28/14: Left hip xray: Left femoral neck fracture.  11/28/14: Left hip xray (post op): IM nail with 2 screws placed across previously identified left femoral neck fracture. (Post ORIF left femoral fracture)  Assessment & Plan 1. Physical deconditioning Continue PT/OT for gait/balance/strength training to restore/maximize functional capacity. Fall risk precautions.   2. Hypothyroidism, unspecified hypothyroidism type Continue to levothyroxine 23mcg daily  3. Depression Continue remeron 15mg  daily  4. Anemia of chronic disease Stable. Continue to monitor h&h  5. Chronic diastolic CHF (congestive heart failure) Daily weight. Continue oxygen therapy via nasal cannula. Continue lopressor 12.5mg  twice daily, losartan 25mg  daily, and lasix 40mg  twice daily. Start potassium supplement 77meq daily.  Recheck BMP in 1 week.   6. Insomnia Continue ambien 5mg  nightly  7. Gastroesophageal reflux disease, esophagitis presence not specified Continue omeprazole 40mg  daily  8. Essential hypertension Continue losartan 25mg  daily, lasix 40mg  twice daily, and lopressor 12.5mg  twice daily. Will start potassium 31mEQ daily for supplement.   9. Generalized OA Continue tylenol 650mg  every six hours as needed for mild pain and norco 5/325mg  1/2-1 tab twice daily as needed.   10. Intertrochanteric fracture of left hip, closed, with routine healing, subsequent encounter Pain is adequately controlled with current regimen. Continue tylenol 650mg  every six hours as needed for mild pain, norco 5/325mg  1/2-1 tab twice daily as needed for moderate to sever pain, or oxycodone 5mg  every 2 hours as needed for moderate to severe pain. Continue lovenox 64mcg daily x 4 weeks for dvt prophylaxis and f/u with ortho. WBAT. Continue to work with PT/OT for gait/strenth/balance training to maximize/restore functional capacity. Fall risk precautions  11. Paroxysmal atrial fibrillation Stable. Rate controlled. Continue amiodarone 200mg  twice daily, lopressor 12.5mg  twice daily, asa  81mg  daily, and cardizem 30mg  every six hours as needed for HR>120. Continue to monitor   12. Hyponatremia Improving per labs. Will recheck bmp in 1 week. Continue to monitor   Diagnostic Studies/Labs Ordered: BMP in 1 week   Time spent: 50 minutes on care coordination  Family/Staff Communication Plan of care discussed with patient and nursing staff. Patient and nursing staff verbalized understanding and agree with plan of care. No additional questions or concerns reported.    Arthur Holms, MSN, AGNP-C [facility] [address] [place], [place] [address] [phone] [8am-5pm] After hours: [phone]

## 2014-12-03 NOTE — Telephone Encounter (Signed)
[facility] 

## 2014-12-05 ENCOUNTER — Non-Acute Institutional Stay (SKILLED_NURSING_FACILITY): Payer: Medicare Other | Admitting: Internal Medicine

## 2014-12-05 DIAGNOSIS — K219 Gastro-esophageal reflux disease without esophagitis: Secondary | ICD-10-CM

## 2014-12-05 DIAGNOSIS — F329 Major depressive disorder, single episode, unspecified: Secondary | ICD-10-CM

## 2014-12-05 DIAGNOSIS — I10 Essential (primary) hypertension: Secondary | ICD-10-CM

## 2014-12-05 DIAGNOSIS — E46 Unspecified protein-calorie malnutrition: Secondary | ICD-10-CM

## 2014-12-05 DIAGNOSIS — F32A Depression, unspecified: Secondary | ICD-10-CM

## 2014-12-05 DIAGNOSIS — I5032 Chronic diastolic (congestive) heart failure: Secondary | ICD-10-CM

## 2014-12-05 DIAGNOSIS — D509 Iron deficiency anemia, unspecified: Secondary | ICD-10-CM

## 2014-12-05 DIAGNOSIS — S72142D Displaced intertrochanteric fracture of left femur, subsequent encounter for closed fracture with routine healing: Secondary | ICD-10-CM

## 2014-12-05 DIAGNOSIS — R5381 Other malaise: Secondary | ICD-10-CM

## 2014-12-05 DIAGNOSIS — E039 Hypothyroidism, unspecified: Secondary | ICD-10-CM

## 2014-12-05 DIAGNOSIS — I4891 Unspecified atrial fibrillation: Secondary | ICD-10-CM

## 2014-12-05 LAB — CBC AND DIFFERENTIAL
HCT: 33 % — AB (ref 36–46)
Hemoglobin: 10.2 g/dL — AB (ref 12.0–16.0)
PLATELETS: 337 10*3/uL (ref 150–399)
WBC: 6.8 10*3/mL

## 2014-12-05 LAB — BASIC METABOLIC PANEL
BUN: 23 mg/dL — AB (ref 4–21)
Creatinine: 0.9 mg/dL (ref 0.5–1.1)
Glucose: 97 mg/dL
POTASSIUM: 3.8 mmol/L (ref 3.4–5.3)
SODIUM: 130 mmol/L — AB (ref 137–147)

## 2014-12-05 NOTE — Progress Notes (Signed)
Patient ID: Cindy [name], female   DOB: July 17, 1922, 80 y.o.   MRN: [id number]     Facility: [facility]    PCP: Elsie Stain, MD  Code Status: dnr  Allergies  Allergen Reactions  . Lipitor [Atorvastatin] Nausea Only and Other (See Comments)    LFT elevation  . Morphine And Related Other (See Comments)    "drives me crazy" and hyperactivity  . Irbesartan Swelling  . Ramipril Swelling    REACTION: lips swelling  . Telmisartan-Hctz Other (See Comments)    REACTION: incontinence  . Amiodarone Other (See Comments)    'terrible headaches'  . Hydrochlorothiazide Other (See Comments)    hyponatremia  . Metoprolol Other (See Comments)    Headache, dizzy, "terribly sick"  . Other Other (See Comments)    ANTICOAGULANTS - not a candidate due to history of falls and bleeding  . Tikosyn [Dofetilide] Other (See Comments)    Not a candidate due to Prolonged QT  . Norpace [Disopyramide] Other (See Comments)    Dry mouth    Chief Complaint  Patient presents with  . New Admit To SNF     HPI:  80y/o female patient is here for STR post hospital admission from 11/28/14 to 12/02/14 for left hip intertrochanteric fracture after a mechanical fall at the nursing home. She underwent ORIF with nailing of left hip. Prior to this, she was here for STR post hospital admission 11/11/14 to 11/18/14 for acute respiratory failure. She is seen in her room today. She feels tired and weak. As per staff, her o2 sat dropped during therapy and she is weak.  She has PMH of paroxysmal afib, hypertrophic cardiomyopathy,hypothyroidism, CHF, depression, macular degeneration-legally blind in both eyes and urinary incontinence.   Review of Systems:  Constitutional: Negative for fever, chills, diaphoresis. positive for fatigue HENT: Negative for congestion Respiratory: positive for cough, shortness of breath with exertion Cardiovascular: Negative for chest pain,  palpitations Gastrointestinal: Negative for heartburn, nausea, vomiting, abdominal pain Genitourinary: Negative for dysuria Psychiatric/Behavioral: Negative for depression  Past Medical History  Diagnosis Date  . Atrial fibrillation 04/23-24/2007    a. recurrent PAF with RVR in September 2013. b. Evaluated 03/2013, previously intolerant to Norpace and Amiodarone - consider Multaq if recurs. c. Not on anticoag due to history of falls and also some internal bleeding per son.  . Hypertrophic cardiomyopathy     i am not sure SK 2015  . Urinary incontinence   . Diverticulosis of colon (without mention of hemorrhage) 2003/ 08/2000    EGD/colonoscopy Barretts esophagus//H.H divertics 08/2000  . Cervical mass     C2 lateral mass fracture  . Hypertension   . HLD (hyperlipidemia)     219/497  . Hypothyroidism   . Multinodular goiter (nontoxic)   . Osteoporosis   . Jaundice ~ 1935    "in grade school"  . Degenerative joint disease   . Depression   . Personal history of colonic polyps 02/29/2012    tubular adenoma  . Barrett's esophagus   . CAD (coronary artery disease)     a. NSTEMI 03/2013: 2/2 diagonal disease (small, not amenable to PCI, for med rx).  . Moderate mitral regurgitation 2014  . Mild aortic stenosis 2014  . HOH (hard of hearing)   . Anemia, iron deficiency   . GERD (gastroesophageal reflux disease)   . Legally blind     "both eyes/Dr. Zigmund Daniel" (11/11/2014)  . Macular degeneration of both eyes    Past  Surgical History  Procedure Laterality Date  . Bladder surgery  1990's    bladder tack early 90's  . Tear duct probing  07/29/03    tear duct surg  . Cystourethroscopy  09/17/03  . Rotator cuff repair Bilateral ? date; 09/07/05    left; right( Dr. Gladstone Lighter)  . Appendectomy  1941  . Thyroid ultrasound  10/14/2003    MNG, no dominant masses  . Doppler echocardiography  03/05/2002&09/11/2003    ECHO, EF wnl, mild stenosis, A.S. mild MR, Mild T.R03/31/2003//ECHO EF  70%,LVH, ?diast dysfunction 09/11/2003  . Cataract extraction, bilateral  2003  . Dilation and curettage of uterus  09/07/2000    endometrial polyps removed, path all benign   . Tonsillectomy and adenoidectomy      "as a child"  . Fracture surgery  2010    right knee  . Knee arthroscopy Right   . Reduction mammaplasty  1981  . Incontinence surgery      "she's had a tack and a sling"  . Left heart catheterization with coronary angiogram N/A 03/27/2013    Procedure: LEFT HEART CATHETERIZATION WITH CORONARY ANGIOGRAM;  Surgeon: Peter M Martinique, MD;  Location: [facility] CATH LAB;  Service: Cardiovascular;  Laterality: N/A;  . Femur im nail Left 11/28/2014    Procedure: INTRAMEDULLARY  NAIL Left FEMORAL;  Surgeon: Marybelle Killings, MD;  Location: [facility];  Service: Orthopedics;  Laterality: Left;   Social History:   reports that she quit smoking about 40 years ago. Her smoking use included Cigarettes. She has a 2 pack-year smoking history. She has never used smokeless tobacco. She reports that she does not drink alcohol or use illicit drugs.  Family History  Problem Relation Age of Onset  . Heart failure Mother     CHF, DM, HBP  . Hypertension Mother   . Uterine cancer Mother   . Stroke Mother   . Colon cancer Neg Hx   . Esophageal cancer Neg Hx   . Rectal cancer Neg Hx   . Stomach cancer Neg Hx     Medications: Patient's Medications  New Prescriptions   No medications on file  Previous Medications   ACETAMINOPHEN (TYLENOL) 325 MG TABLET    Take 2 tablets (650 mg total) by mouth every 6 (six) hours as needed for mild pain (or Fever >/= 101).   AMIODARONE (PACERONE) 200 MG TABLET    Take 1 tablet (200 mg total) by mouth 2 (two) times daily.   ASPIRIN 81 MG CHEWABLE TABLET    Chew 1 tablet (81 mg total) by mouth daily.   DILTIAZEM (CARDIZEM) 30 MG TABLET    Take 1 tablet (30 mg total) by mouth every 6 (six) hours as needed (rapid heart rate.).   DOCUSATE SODIUM (COLACE) 100 MG CAPSULE    Take 100  mg by mouth daily as needed for mild constipation.   ENOXAPARIN (LOVENOX) 30 MG/0.3ML INJECTION    Inject 0.3 mLs (30 mg total) into the skin daily.   FLUTICASONE (FLONASE) 50 MCG/ACT NASAL SPRAY    Place 1 spray into both nostrils daily.   FUROSEMIDE (LASIX) 40 MG TABLET    Take 1 tablet (40 mg total) by mouth 2 (two) times daily.   GUAIFENESIN-DEXTROMETHORPHAN (ROBITUSSIN DM) 100-10 MG/5ML SYRUP    Take 5 mLs by mouth every 4 (four) hours as needed for cough.   HYDROCODONE-ACETAMINOPHEN (NORCO/VICODIN) 5-325 MG PER TABLET    Take 1 tablet by mouth every 6 (six) hours as needed for  moderate pain.   HYPROMELLOSE (ARTIFICIAL TEARS OP)    Place 1 drop into both eyes 2 (two) times daily.   IPRATROPIUM-ALBUTEROL (DUONEB) 0.5-2.5 (3) MG/3ML SOLN    Take 3 mLs by nebulization every 4 (four) hours as needed.   LEVOTHYROXINE (SYNTHROID, LEVOTHROID) 25 MCG TABLET    Take 1 tablet (25 mcg total) by mouth every morning.   LORAZEPAM (ATIVAN) 1 MG TABLET    Take 1 mg by mouth every 6 (six) hours as needed for anxiety.   LOSARTAN (COZAAR) 25 MG TABLET    Take 25 mg by mouth daily.   METOPROLOL TARTRATE (LOPRESSOR) 25 MG TABLET    Take 12.5 mg by mouth 2 (two) times daily.   MIRTAZAPINE (REMERON) 15 MG TABLET    Take 15 mg by mouth at bedtime.   MULTIPLE VITAMINS-MINERALS (HEALTHY EYES) TABS    Take 1 tablet by mouth 2 (two) times daily.   OMEPRAZOLE (PRILOSEC) 40 MG CAPSULE    Take 40 mg by mouth daily.   OXYCODONE (ROXICODONE INTENSOL) 20 MG/ML CONCENTRATED SOLUTION    Administer 0.22ml every 2 hours as needed for respiratory distress/moderate to severe pain   POTASSIUM CHLORIDE SA (K-DUR,KLOR-CON) 20 MEQ TABLET    Take 40 mEq by mouth daily.   ZOLPIDEM (AMBIEN) 5 MG TABLET    Take 1 tablet (5 mg total) by mouth at bedtime as needed for sleep.  Modified Medications   No medications on file  Discontinued Medications   No medications on file     Physical Exam: Filed Vitals:   12/05/14 1238  BP: 128/75   Pulse: 79  Temp: 98.5 F (36.9 C)  Resp: 18  SpO2: 99%    General- elderly female in no acute distress, frail Head- atraumatic, normocephalic Eyes-no pallor, no icterus, no discharge Neck- no cervical lymphadenopathy Throat- moist mucus membrane, normal oropharynx, dentition is  Nose- normal nasal mucosa Cardiovascular- irregular HR, systolic murmur present Respiratory- bilateral poor air entry, no wheeze or crackles, no rhonchi Abdomen- bowel sounds present, soft, non tender Musculoskeletal- able to move all 4 extremities, generalized weakness, limited ROM in left hip Neurological- no focal deficit Skin- warm and dry, surgical incision in left leg healing well, staples in place Psychiatry- alert and oriented, normal mood and affect    Labs reviewed: Basic Metabolic Panel:  Recent Labs  02/04/14 1900  08/19/14 1117  09/16/14 1957  11/29/14 0755 11/30/14 0518 12/01/14 0513 12/03/14  NA  --   < > 126*  < >  --   < > 132* 129* 129* 131*  K  --   < > 5.5*  < >  --   < > 4.8 3.8 3.4* 4.0  CL  --   < > 96  < >  --   < > 94* 94* 94*  --   CO2  --   < > 25  < >  --   < > 29 25 26   --   GLUCOSE  --   < > 135*  < >  --   < > 109* 88 112*  --   BUN  --   < > 11  < >  --   < > 14 12 15 14   CREATININE 0.78  < > 0.51  < >  --   < > 1.05 1.10 1.01 0.8  CALCIUM  --   < > 9.3  < >  --   < > 8.2* 8.1* 7.8*  --  MG 2.1  --  2.1  --  2.1  --   --   --   --   --   < > = values in this interval not displayed. Liver Function Tests:  Recent Labs  11/14/14 0656 11/16/14 0325 11/28/14 0919  AST 30 22 25   ALT 42* 25 19  ALKPHOS 68 66 58  BILITOT 0.3 0.3 0.6  PROT 5.7* 5.8* 5.5*  ALBUMIN 2.7* 2.7* 3.1*   No results for input(s): LIPASE, AMYLASE in the last 8760 hours. No results for input(s): AMMONIA in the last 8760 hours. CBC:  Recent Labs  11/11/14 1028  11/16/14 0325 11/28/14 0919 11/29/14 0755 11/30/14 0518 12/01/14 0513 12/03/14  WBC 6.0  < > 7.3 7.1 9.0 7.2 7.2 5.7   NEUTROABS 5.0  --  4.8 4.9  --   --   --   --   HGB 9.2*  < > 9.9* 9.0* 11.4* 9.8* 9.5* 10.6*  HCT 32.2*  < > 32.9* 31.9* 36.7 32.1* 31.1* 36  MCV 75.6*  < > 74.9* 76.5* 77.8* 78.1 79.9  --   PLT 280  < > 298 223 179 175 191 251  < > = values in this interval not displayed. Cardiac Enzymes:  Recent Labs  11/11/14 2146 11/12/14 0332 11/28/14 0902  TROPONINI <0.30 <0.30 0.06*   Imaging 11/28/14: Left hip xray: Left femoral neck fracture.   11/28/14: Left hip xray (post op): IM nail with 2 screws placed across previously identified left femoral neck fracture. (Post ORIF left femoral fracture)   Assessment/Plan  Physical deconditioning Will have patient work with PT/OT as tolerated to regain strength and restore function.  Fall precautions are in place.   Left hip fracture S/p ORIF. Continue norco prn for pain, continue lovenox for dvt prophylaxis, has f/u with orthopedics, continue skin care at surgical site, WBAT, to work with PT and OT  Anemia Has anemia of chronic disease and likely post op from blood loss as well, check cbc in 1 week  CHF Monitor daily weigh. Continue o2 by nasal canula. Continue lopressor 12.5 mg bid, losartan 25 mg daily and lasix 40 mg bid. Continue kcl supplement.   Protein calorie malnutrition monitor po intake, decline anticipated  Essential hypertension Stable readings,continue current regimen of losartan and lopressor, monitor bp  Depression Continue remeron 15 mg daily   Atrial fibrillation Continue amiodarone, lopressor and asa 81mg  daily with prn cardizem  Hypothyroidism Continue synthroid 67mcg daily   GERD Continue omeprazole 40mg  daily   Family/ staff Communication: reviewed care plan with patient and nursing supervisor  Goals of care: short term rehabilitation   Labs/tests ordered: cbc in 1 week    Blanchie Serve, MD  [place] [phone] (Monday-Friday 8 am - 5 pm) [phone] (afterhours)

## 2014-12-10 ENCOUNTER — Non-Acute Institutional Stay (SKILLED_NURSING_FACILITY): Payer: Medicare Other | Admitting: Registered Nurse

## 2014-12-10 ENCOUNTER — Encounter: Payer: Self-pay | Admitting: Registered Nurse

## 2014-12-10 DIAGNOSIS — T814XXA Infection following a procedure, initial encounter: Secondary | ICD-10-CM

## 2014-12-10 DIAGNOSIS — IMO0001 Reserved for inherently not codable concepts without codable children: Secondary | ICD-10-CM

## 2014-12-10 NOTE — Progress Notes (Signed)
Patient ID: Cindy [name], female   DOB: [birth year], 81 y.o.   MRN: [id number]   Place of Service: [facility]  Allergies  Allergen Reactions  . Lipitor [Atorvastatin] Nausea Only and Other (See Comments)    LFT elevation  . Morphine And Related Other (See Comments)    "drives me crazy" and hyperactivity  . Irbesartan Swelling  . Ramipril Swelling    REACTION: lips swelling  . Telmisartan-Hctz Other (See Comments)    REACTION: incontinence  . Amiodarone Other (See Comments)    'terrible headaches'  . Hydrochlorothiazide Other (See Comments)    hyponatremia  . Metoprolol Other (See Comments)    Headache, dizzy, "terribly sick"  . Other Other (See Comments)    ANTICOAGULANTS - not a candidate due to history of falls and bleeding  . Tikosyn [Dofetilide] Other (See Comments)    Not a candidate due to Prolonged QT  . Norpace [Disopyramide] Other (See Comments)    Dry mouth    Code Status: DNR  Goals of Care: Comfort and Quality of Life/Palliative   Chief Complaint  Patient presents with  . Acute Visit    possible surgical wound infection    HPI  81 y.o. female with PMH of left hip fracture, paroxysmal afib, hypertrophic cardiomyopathy, UI, hypothyroidism, CHF, acute respiratory failure, depression, macular degeneration-legally blind in both eyes among others is being seen for an acute visit at the request of wound RN for possible infection of surgical wound. Per wound nursing, she was told that patient's surgical wound has significant amount of drainage but she did not find any drainage during wound assessment today. However, wound bed was very erythematous with honey-colored crusting around staples.   Review of Systems Constitutional: Negative for fever and chills. Cardiovascular: Negative for chest pain, palpitations, and leg swelling  Respiratory: Negative cough and wheezing. Positive for shortness of breath, especially with exertion (chronic).    Gastrointestinal: Negative for nausea and vomiting. Negative for abdominal pain Musculoskeletal: Positive for pain of Left hip Neurological: Negative for dizziness and headache     Past Medical History  Diagnosis Date  . Atrial fibrillation 04/23-24/2007    a. recurrent PAF with RVR in September 2013. b. Evaluated 03/2013, previously intolerant to Norpace and Amiodarone - consider Multaq if recurs. c. Not on anticoag due to history of falls and also some internal bleeding per son.  . Hypertrophic cardiomyopathy     i am not sure SK 2015  . Urinary incontinence   . Diverticulosis of colon (without mention of hemorrhage) 2003/ 08/2000    EGD/colonoscopy Barretts esophagus//H.H divertics 08/2000  . Cervical mass     C2 lateral mass fracture  . Hypertension   . HLD (hyperlipidemia)     219/497  . Hypothyroidism   . Multinodular goiter (nontoxic)   . Osteoporosis   . Jaundice ~ 1935    "in grade school"  . Degenerative joint disease   . Depression   . Personal history of colonic polyps 02/29/2012    tubular adenoma  . Barrett's esophagus   . CAD (coronary artery disease)     a. NSTEMI 03/2013: 2/2 diagonal disease (small, not amenable to PCI, for med rx).  . Moderate mitral regurgitation 2014  . Mild aortic stenosis 2014  . HOH (hard of hearing)   . Anemia, iron deficiency   . GERD (gastroesophageal reflux disease)   . Legally blind     "both eyes/Dr. Zigmund Daniel" (11/11/2014)  . Macular degeneration of both  eyes     Past Surgical History  Procedure Laterality Date  . Bladder surgery  1990's    bladder tack early 90's  . Tear duct probing  07/29/03    tear duct surg  . Cystourethroscopy  09/17/03  . Rotator cuff repair Bilateral ? date; 09/07/05    left; right( Dr. Gladstone Lighter)  . Appendectomy  1941  . Thyroid ultrasound  10/14/2003    MNG, no dominant masses  . Doppler echocardiography  03/05/2002&09/11/2003    ECHO, EF wnl, mild stenosis, A.S. mild MR, Mild  T.R03/31/2003//ECHO EF 70%,LVH, ?diast dysfunction 09/11/2003  . Cataract extraction, bilateral  2003  . Dilation and curettage of uterus  09/07/2000    endometrial polyps removed, path all benign   . Tonsillectomy and adenoidectomy      "as a child"  . Fracture surgery  2010    right knee  . Knee arthroscopy Right   . Reduction mammaplasty  1981  . Incontinence surgery      "she's had a tack and a sling"  . Left heart catheterization with coronary angiogram N/A 03/27/2013    Procedure: LEFT HEART CATHETERIZATION WITH CORONARY ANGIOGRAM;  Surgeon: Peter M Martinique, MD;  Location: [facility] CATH LAB;  Service: Cardiovascular;  Laterality: N/A;  . Femur im nail Left 11/28/2014    Procedure: INTRAMEDULLARY  NAIL Left FEMORAL;  Surgeon: Marybelle Killings, MD;  Location: [facility];  Service: Orthopedics;  Laterality: Left;    History  Substance Use Topics  . Smoking status: Former Smoker -- 0.50 packs/day for 4 years    Types: Cigarettes    Quit date: 07/06/1974  . Smokeless tobacco: Never Used  . Alcohol Use: No    Family History  Problem Relation Age of Onset  . Heart failure Mother     CHF, DM, HBP  . Hypertension Mother   . Uterine cancer Mother   . Stroke Mother   . Colon cancer Neg Hx   . Esophageal cancer Neg Hx   . Rectal cancer Neg Hx   . Stomach cancer Neg Hx       Medication List       This list is accurate as of: 12/10/14  7:09 PM.  Always use your most recent med list.               acetaminophen 325 MG tablet  Commonly known as:  TYLENOL  Take 2 tablets (650 mg total) by mouth every 6 (six) hours as needed for mild pain (or Fever >/= 101).     amiodarone 200 MG tablet  Commonly known as:  PACERONE  Take 1 tablet (200 mg total) by mouth 2 (two) times daily.     ARTIFICIAL TEARS OP  Place 1 drop into both eyes 2 (two) times daily.     aspirin 81 MG chewable tablet  Chew 1 tablet (81 mg total) by mouth daily.     diltiazem 30 MG tablet  Commonly known as:  CARDIZEM   Take 1 tablet (30 mg total) by mouth every 6 (six) hours as needed (rapid heart rate.).     docusate sodium 100 MG capsule  Commonly known as:  COLACE  Take 100 mg by mouth daily as needed for mild constipation.     enoxaparin 30 MG/0.3ML injection  Commonly known as:  LOVENOX  Inject 0.3 mLs (30 mg total) into the skin daily.     fluticasone 50 MCG/ACT nasal spray  Commonly known as:  FLONASE  Place 1 spray into both nostrils daily.     furosemide 40 MG tablet  Commonly known as:  LASIX  Take 1 tablet (40 mg total) by mouth 2 (two) times daily.     guaiFENesin-dextromethorphan 100-10 MG/5ML syrup  Commonly known as:  ROBITUSSIN DM  Take 5 mLs by mouth every 4 (four) hours as needed for cough.     HEALTHY EYES Tabs  Take 1 tablet by mouth 2 (two) times daily.     HYDROcodone-acetaminophen 5-325 MG per tablet  Commonly known as:  NORCO/VICODIN  Take 1 tablet by mouth every 6 (six) hours as needed for moderate pain.     ipratropium-albuterol 0.5-2.5 (3) MG/3ML Soln  Commonly known as:  DUONEB  Take 3 mLs by nebulization every 4 (four) hours as needed.     levothyroxine 25 MCG tablet  Commonly known as:  SYNTHROID, LEVOTHROID  Take 1 tablet (25 mcg total) by mouth every morning.     LORazepam 1 MG tablet  Commonly known as:  ATIVAN  Take 1 mg by mouth every 6 (six) hours as needed for anxiety.     losartan 25 MG tablet  Commonly known as:  COZAAR  Take 25 mg by mouth daily.     metoprolol tartrate 25 MG tablet  Commonly known as:  LOPRESSOR  Take 12.5 mg by mouth 2 (two) times daily.     mirtazapine 15 MG tablet  Commonly known as:  REMERON  Take 15 mg by mouth at bedtime.     omeprazole 40 MG capsule  Commonly known as:  PRILOSEC  Take 40 mg by mouth daily.     oxyCODONE 20 MG/ML concentrated solution  Commonly known as:  ROXICODONE INTENSOL  Administer 0.23ml every 2 hours as needed for respiratory distress/moderate to severe pain     potassium chloride  SA 20 MEQ tablet  Commonly known as:  K-DUR,KLOR-CON  Take 40 mEq by mouth daily.     zolpidem 5 MG tablet  Commonly known as:  AMBIEN  Take 1 tablet (5 mg total) by mouth at bedtime as needed for sleep.        Physical Exam  BP 135/65 mmHg  Pulse 65  Temp(Src) 98.8 F (37.1 C)  Resp 18  Ht 5' (1.524 m)  Wt 133 lb 11.2 oz (60.646 kg)  BMI 26.11 kg/m2  SpO2 99%  Constitutional: WDWN elderly female in no acute distress. Conversant and pleasant  HEENT: Normocephalic and atraumatic. PERRL. EOM intact. No icterus. Legally blind. Hard of hearing..  Cardiac: Normal S1, S2. RRR with 2/6 systolic mumur.  Lungs: No respiratory distress. Breath sounds diminished bilaterally with wheezes throughout. Oxygen via [place] in place  Abdomen: Audible bowel sounds in all quadrants. Soft, nontender, nondistended.  Skin: Warm and dry. No rash noted. No erythema. Bruising noted around Left hip surgical incisions. Large area of worsening erythema noted around wound bed. Staples with cloudy white-yellow colored crust. Tender to palpation   Labs Reviewed  CBC Latest Ref Rng 12/03/2014 12/01/2014 11/30/2014  WBC - 5.7 7.2 7.2  Hemoglobin 12.0 - 16.0 g/dL 10.6(A) 9.5(L) 9.8(L)  Hematocrit 36 - 46 % 36 31.1(L) 32.1(L)  Platelets 150 - 399 K/L 251 191 175    CMP Latest Ref Rng 12/03/2014 12/01/2014 11/30/2014  Glucose 70 - 99 mg/dL - 112(H) 88  BUN 4 - 21 mg/dL 14 15 12   Creatinine 0.5 - 1.1 mg/dL 0.8 1.01 1.10  Sodium 137 - 147 mmol/L 131(A) 129(L) 129(L)  Potassium 3.4 - 5.3 mmol/L 4.0 3.4(L) 3.8  Chloride 96 - 112 mEq/L - 94(L) 94(L)  CO2 19 - 32 mmol/L - 26 25  Calcium 8.4 - 10.5 mg/dL - 7.8(L) 8.1(L)  Total Protein 6.0 - 8.3 g/dL - - -  Total Bilirubin 0.3 - 1.2 mg/dL - - -  Alkaline Phos 39 - 117 U/L - - -  AST 0 - 37 U/L - - -  ALT 0 - 35 U/L - - -    Assessment & Plan 1. Surgical wound infection, initial encounter Start doxycyline 100mg  twice daily x 5 days with florastor 250mg  twice  daily x 1 week. Continue to monitor.   Family/Staff Communication Plan of care discussed with patient and nursing staff. Patient and nursing staff verbalized understanding and agree with plan of care. No additional questions or concerns reported.    Arthur Holms, MSN, AGNP-C [facility] [address] [place], [place] [address] [phone] [8am-5pm] After hours: [phone]

## 2014-12-12 LAB — CBC AND DIFFERENTIAL
HCT: 33 % — AB (ref 36–46)
Hemoglobin: 10.2 g/dL — AB (ref 12.0–16.0)
PLATELETS: 332 10*3/uL (ref 150–399)
WBC: 6.8 10^3/mL

## 2014-12-24 ENCOUNTER — Other Ambulatory Visit: Payer: Self-pay | Admitting: *Deleted

## 2014-12-24 MED ORDER — HYDROCODONE-ACETAMINOPHEN 5-325 MG PO TABS: ORAL_TABLET | ORAL | Status: DC

## 2014-12-24 NOTE — Telephone Encounter (Signed)
[facility] 

## 2015-01-01 ENCOUNTER — Non-Acute Institutional Stay (SKILLED_NURSING_FACILITY): Payer: Medicare Other | Admitting: Registered Nurse

## 2015-01-01 DIAGNOSIS — E039 Hypothyroidism, unspecified: Secondary | ICD-10-CM

## 2015-01-01 DIAGNOSIS — I10 Essential (primary) hypertension: Secondary | ICD-10-CM

## 2015-01-01 DIAGNOSIS — G4700 Insomnia, unspecified: Secondary | ICD-10-CM

## 2015-01-01 DIAGNOSIS — F329 Major depressive disorder, single episode, unspecified: Secondary | ICD-10-CM

## 2015-01-01 DIAGNOSIS — D638 Anemia in other chronic diseases classified elsewhere: Secondary | ICD-10-CM

## 2015-01-01 DIAGNOSIS — E871 Hypo-osmolality and hyponatremia: Secondary | ICD-10-CM

## 2015-01-01 DIAGNOSIS — J309 Allergic rhinitis, unspecified: Secondary | ICD-10-CM

## 2015-01-01 DIAGNOSIS — I480 Paroxysmal atrial fibrillation: Secondary | ICD-10-CM

## 2015-01-01 DIAGNOSIS — F32A Depression, unspecified: Secondary | ICD-10-CM

## 2015-01-01 DIAGNOSIS — S72142D Displaced intertrochanteric fracture of left femur, subsequent encounter for closed fracture with routine healing: Secondary | ICD-10-CM

## 2015-01-01 DIAGNOSIS — J9611 Chronic respiratory failure with hypoxia: Secondary | ICD-10-CM

## 2015-01-01 DIAGNOSIS — I5032 Chronic diastolic (congestive) heart failure: Secondary | ICD-10-CM

## 2015-01-01 DIAGNOSIS — K219 Gastro-esophageal reflux disease without esophagitis: Secondary | ICD-10-CM

## 2015-01-01 DIAGNOSIS — M159 Polyosteoarthritis, unspecified: Secondary | ICD-10-CM

## 2015-01-01 DIAGNOSIS — E46 Unspecified protein-calorie malnutrition: Secondary | ICD-10-CM

## 2015-01-01 NOTE — Progress Notes (Signed)
Patient ID: Cindy [name], female   DOB: 04-18-1922, 81 y.o.   MRN: [id number]   Place of Service: [facility]  Allergies  Allergen Reactions  . Lipitor [Atorvastatin] Nausea Only and Other (See Comments)    LFT elevation  . Morphine And Related Other (See Comments)    "drives me crazy" and hyperactivity  . Irbesartan Swelling  . Ramipril Swelling    REACTION: lips swelling  . Telmisartan-Hctz Other (See Comments)    REACTION: incontinence  . Amiodarone Other (See Comments)    'terrible headaches'  . Hydrochlorothiazide Other (See Comments)    hyponatremia  . Metoprolol Other (See Comments)    Headache, dizzy, "terribly sick"  . Other Other (See Comments)    ANTICOAGULANTS - not a candidate due to history of falls and bleeding  . Tikosyn [Dofetilide] Other (See Comments)    Not a candidate due to Prolonged QT  . Norpace [Disopyramide] Other (See Comments)    Dry mouth    Code Status: DNR  Goals of Care: Comfort and Quality of Life/Palliative   Chief Complaint  Patient presents with  . Medical Management of Chronic Issues    L hip fx, afib, CHF, OA, GERD, anemia, depression, insomnia, HTN    HPI  81 y.o. female with PMH of paroxysmal afib, hypertrophic cardiomyopathy, UI, hypothyroidism, CHF, respiratory failure on chronic oxygen at 2lmp via [place], depression, left hip fracture, macular degeneration-legally blind in both eyes among others is being seen for a routine visit for management of her chronic issues. Continues to have gradual weight loss-7lbs since readmission to the facility. No recent fall or skin concerns reported. No change in behavior or functional status reported. No concerns from staff. Seen in room today. No complaints verbalized from patient. Reported feeling better than she did a few weeks ago. CHF stable with no recent exacerbation. HTN stable with BP in 116-141/50-80s. Afib stable with lopressor and amiodarone. GERD stable with ppi. Depression  stable with remeron. Pain r/t left hip fx is adequately controlled with current regimen. Will transition under Hospice service the beginning of next month.   Review of Systems Constitutional: Negative for fever, chills, and fatigue.  HENT: Negative for ear pain, congestion, and sore throat  Eyes: Negative for eye pain and eye discharge. Positive for vision impairment-legally blind  Cardiovascular: Negative for chest pain, palpitations, and leg swelling  Respiratory: Negative cough, wheezing, shortness of breath. Gastrointestinal: Negative for nausea and vomiting. Negative for abdominal pain, diarrhea and constipation. Positive for reflux  Genitourinary: Negative for dysuria and hematuria  Endocrine: Negative for polydipsia, polyphagia, and polyuria  Musculoskeletal: Positive for generalized pain from OA  Neurological: Negative for dizziness and headache  Skin: Negative for rash and wound.  Psychiatric: Negative for depression    Past Medical History  Diagnosis Date  . Atrial fibrillation 04/23-24/2007    a. recurrent PAF with RVR in September 2013. b. Evaluated 03/2013, previously intolerant to Norpace and Amiodarone - consider Multaq if recurs. c. Not on anticoag due to history of falls and also some internal bleeding per son.  . Hypertrophic cardiomyopathy     i am not sure SK 2015  . Urinary incontinence   . Diverticulosis of colon (without mention of hemorrhage) 2003/ 08/2000    EGD/colonoscopy Barretts esophagus//H.H divertics 08/2000  . Cervical mass     C2 lateral mass fracture  . Hypertension   . HLD (hyperlipidemia)     219/497  . Hypothyroidism   . Multinodular  goiter (nontoxic)   . Osteoporosis   . Jaundice ~ 1935    "in grade school"  . Degenerative joint disease   . Depression   . Personal history of colonic polyps 02/29/2012    tubular adenoma  . Barrett's esophagus   . CAD (coronary artery disease)     a. NSTEMI 03/2013: 2/2 diagonal disease (small, not amenable  to PCI, for med rx).  . Moderate mitral regurgitation 2014  . Mild aortic stenosis 2014  . HOH (hard of hearing)   . Anemia, iron deficiency   . GERD (gastroesophageal reflux disease)   . Legally blind     "both eyes/Dr. Zigmund Daniel" (11/11/2014)  . Macular degeneration of both eyes     Past Surgical History  Procedure Laterality Date  . Bladder surgery  1990's    bladder tack early 90's  . Tear duct probing  07/29/03    tear duct surg  . Cystourethroscopy  09/17/03  . Rotator cuff repair Bilateral ? date; 09/07/05    left; right( Dr. Gladstone Lighter)  . Appendectomy  1941  . Thyroid ultrasound  10/14/2003    MNG, no dominant masses  . Doppler echocardiography  03/05/2002&09/11/2003    ECHO, EF wnl, mild stenosis, A.S. mild MR, Mild T.R03/31/2003//ECHO EF 70%,LVH, ?diast dysfunction 09/11/2003  . Cataract extraction, bilateral  2003  . Dilation and curettage of uterus  09/07/2000    endometrial polyps removed, path all benign   . Tonsillectomy and adenoidectomy      "as a child"  . Fracture surgery  2010    right knee  . Knee arthroscopy Right   . Reduction mammaplasty  1981  . Incontinence surgery      "she's had a tack and a sling"  . Left heart catheterization with coronary angiogram N/A 03/27/2013    Procedure: LEFT HEART CATHETERIZATION WITH CORONARY ANGIOGRAM;  Surgeon: Peter M Martinique, MD;  Location[facility] CATH LAB;  Service: Cardiovascular;  Laterality: N/A;  . Femur im nail Left 11/28/2014    Procedure: INTRAMEDULLARY  NAIL Left FEMORAL;  Surgeon: Marybelle Killings, MD;  Location: [place];  Service: Orthopedics;  Laterality: Left;    History  Substance Use Topics  . Smoking status: Former Smoker -- 0.50 packs/day for 4 years    Types: Cigarettes    Quit date: 07/06/1974  . Smokeless tobacco: Never Used  . Alcohol Use: No    Family History  Problem Relation Age of Onset  . Heart failure Mother     CHF, DM, HBP  . Hypertension Mother   . Uterine cancer Mother   . Stroke Mother     . Colon cancer Neg Hx   . Esophageal cancer Neg Hx   . Rectal cancer Neg Hx   . Stomach cancer Neg Hx       Medication List       This list is accurate as of: 01/01/15  3:46 PM.  Always use your most recent med list.               acetaminophen 325 MG tablet  Commonly known as:  TYLENOL  Take 2 tablets (650 mg total) by mouth every 6 (six) hours as needed for mild pain (or Fever >/= 101).     amiodarone 200 MG tablet  Commonly known as:  PACERONE  Take 1 tablet (200 mg total) by mouth 2 (two) times daily.     ARTIFICIAL TEARS OP  Place 1 drop into both eyes 2 (two)  times daily.     aspirin 81 MG chewable tablet  Chew 1 tablet (81 mg total) by mouth daily.     diltiazem 30 MG tablet  Commonly known as:  CARDIZEM  Take 1 tablet (30 mg total) by mouth every 6 (six) hours as needed (rapid heart rate.).     docusate sodium 100 MG capsule  Commonly known as:  COLACE  Take 100 mg by mouth daily as needed for mild constipation.     fluticasone 50 MCG/ACT nasal spray  Commonly known as:  FLONASE  Place 1 spray into both nostrils daily.     furosemide 40 MG tablet  Commonly known as:  LASIX  Take 1 tablet (40 mg total) by mouth 2 (two) times daily.     guaiFENesin-dextromethorphan 100-10 MG/5ML syrup  Commonly known as:  ROBITUSSIN DM  Take 5 mLs by mouth every 4 (four) hours as needed for cough.     HEALTHY EYES Tabs  Take 1 tablet by mouth 2 (two) times daily.     HYDROcodone-acetaminophen 5-325 MG per tablet  Commonly known as:  NORCO/VICODIN  Take 1/2 tablet by mouth twice daily as needed for moderate pain; Take one tablet by mouth twice daily as needed for severe pain     ipratropium-albuterol 0.5-2.5 (3) MG/3ML Soln  Commonly known as:  DUONEB  Take 3 mLs by nebulization every 4 (four) hours as needed.     levothyroxine 25 MCG tablet  Commonly known as:  SYNTHROID, LEVOTHROID  Take 1 tablet (25 mcg total) by mouth every morning.     LORazepam 1 MG tablet   Commonly known as:  ATIVAN  Take 1 mg by mouth every 6 (six) hours as needed for anxiety.     losartan 25 MG tablet  Commonly known as:  COZAAR  Take 25 mg by mouth daily.     metoprolol tartrate 25 MG tablet  Commonly known as:  LOPRESSOR  Take 12.5 mg by mouth 2 (two) times daily.     mirtazapine 15 MG tablet  Commonly known as:  REMERON  Take 15 mg by mouth at bedtime.     omeprazole 40 MG capsule  Commonly known as:  PRILOSEC  Take 40 mg by mouth daily.     oxyCODONE 20 MG/ML concentrated solution  Commonly known as:  ROXICODONE INTENSOL  Administer 0.52ml every 2 hours as needed for respiratory distress/moderate to severe pain     potassium chloride SA 20 MEQ tablet  Commonly known as:  K-DUR,KLOR-CON  Take 40 mEq by mouth daily.     zolpidem 5 MG tablet  Commonly known as:  AMBIEN  Take 1 tablet (5 mg total) by mouth at bedtime as needed for sleep.        Physical Exam  BP 105/62 mmHg  Pulse 60  Temp(Src) 97.7 F (36.5 C)  Resp 20  Ht 5' (1.524 m)  Wt 126 lb (57.153 kg)  BMI 24.61 kg/m2  SpO2 93%  Constitutional: WDWN elderly female in no acute distress. Conversant and pleasant  HEENT: Normocephalic and atraumatic. PERRL. EOM intact. No icterus. Legally blind. Hard of hearing. No nasal discharge or sinus tenderness. Oral mucosa moist. Posterior pharynx clear of any exudate or lesions.  Neck: Supple and nontender. No lymphadenopathy, masses, or thyromegaly. No JVD or carotid bruits.  Cardiac: Normal S1, S2. RRR with 2/6 systolic mumur. Distal pulses intact. Trace dependent edema.  Lungs: No respiratory distress. Breath sounds diminished bilaterally with wheezes throughout. Oxygen  via  in place  Abdomen: Audible bowel sounds in all quadrants. Soft, nontender, nondistended.  Musculoskeletal: able to move all extremities. Generalized weakness present.  Skin: Warm and dry. No rash noted. No erythema. Left hip surgical incision healed-no signs of infection  noted.  Neurological: Alert and oriented to person.  Psychiatric: Appropriate mood and affect.    Labs Reviewed  CBC Latest Ref Rng 12/05/2014 12/03/2014 12/01/2014  WBC - 6.8 5.7 7.2  Hemoglobin 12.0 - 16.0 g/dL 10.2(A) 10.6(A) 9.5(L)  Hematocrit 36 - 46 % 33(A) 36 31.1(L)  Platelets 150 - 399 K/L 337 251 191    CMP Latest Ref Rng 12/05/2014 12/03/2014 12/01/2014  Glucose 70 - 99 mg/dL - - 112(H)  BUN 4 - 21 mg/dL 23(A) 14 15  Creatinine 0.5 - 1.1 mg/dL 0.9 0.8 1.01  Sodium 137 - 147 mmol/L 130(A) 131(A) 129(L)  Potassium 3.4 - 5.3 mmol/L 3.8 4.0 3.4(L)  Chloride 96 - 112 mEq/L - - 94(L)  CO2 19 - 32 mmol/L - - 26  Calcium 8.4 - 10.5 mg/dL - - 7.8(L)  Total Protein 6.0 - 8.3 g/dL - - -  Total Bilirubin 0.3 - 1.2 mg/dL - - -  Alkaline Phos 39 - 117 U/L - - -  AST 0 - 37 U/L - - -  ALT 0 - 35 U/L - - -    Lab Results  Component Value Date   TSH 1.200 11/13/2014    Lipid Panel     Component Value Date/Time   CHOL 160 08/30/2012 0440   TRIG 77 08/30/2012 0440   HDL 62 08/30/2012 0440   CHOLHDL 2.6 08/30/2012 0440   VLDL 15 08/30/2012 0440   LDLCALC 83 08/30/2012 0440   LDLDIRECT 132.8 08/16/2007 1118    Assessment & Plan 1. Protein-calorie malnutrition Lost 7lbs w/in the past 30 days. Poor prognosis r/t multiple comorbidities. Continue current diet and dietary supplement. Continue to monitor.   2. Hypothyroidism, unspecified hypothyroidism type Stable. Continue to levothyroxine 75mcg daily  3. Depression Stable. Continue remeron 15mg  daily for depression and appetite. Continue to monitor for change in mood  4. Anemia of chronic disease Stable. Continue to monitor h&h  5. Chronic diastolic CHF (congestive heart failure) Stable. No recent exacerbation. Continue oxygen therapy via nasal cannula. Continue lopressor 12.5mg  twice daily, losartan 25mg  daily, and lasix 40mg  twice daily with potassium 34meq daily.   6. Insomnia No issues. Continue ambien 5mg   nightly as needed. Continue to monitor  7. Gastroesophageal reflux disease, esophagitis presence not specified Stable. Continue omeprazole 40mg  daily. Not interested in dose reduction or discontinuation of ppi at this time. Continue to monitor her status  8. Essential hypertension Stable overall. Continue losartan 25mg  daily, lasix 40mg  twice daily, and lopressor 12.5mg  twice daily. Will start potassium 28mEQ daily for supplement.   9. Generalized OA No issues. Continue tylenol 650mg  every six hours as needed for mild pain and norco 5/325mg  1/2-1 tab twice daily as needed. Continue to monitor.   10. Intertrochanteric fracture of left hip, closed, with routine healing, subsequent encounter Surgical wound resolved. Pain is adequately controlled with current regimen. Continue tylenol 650mg  every six hours as needed for mild pain, norco 5/325mg  1/2-1 tab twice daily as needed for moderate to sever pain, or oxycodone 5mg  every 2 hours as needed for moderate to severe pain. Completed Lovenox injection for DVT prophylaxis today.  Continue to monitor  11. Paroxysmal atrial fibrillation Stable. Rate controlled. Continue amiodarone 200mg  twice daily, lopressor 12.5mg   twice daily, asa 81mg  daily, and cardizem 30mg  every six hours as needed for HR>120. Continue to monitor   12. Hyponatremia Stable. Most recent level 130. Will recheck bmp. Continue to monitor her status  13. Allergic rhinitis Stable. Continue flonase nasal spray in each nare daily.   14. Chronic respiratory failure with hypoxia Stable. Continue continuous oxygen therapy at 2lpm via nasal cannula to maintain O2 sat above 88%. Continue to monitor her status.   Labs ordered: bmp  Family/Staff Communication Plan of care discussed with patient and nursing staff. Patient and nursing staff verbalized understanding and agree with plan of care. No additional questions or concerns reported.    Arthur Holms, MSN, AGNP-C [facility] [address] [place], [place] [address] [phone] [8am-5pm] After hours: [phone]

## 2015-01-02 LAB — BASIC METABOLIC PANEL
BUN: 19 mg/dL (ref 4–21)
Creatinine: 0.9 mg/dL (ref 0.5–1.1)
GLUCOSE: 93 mg/dL
Potassium: 4 mmol/L (ref 3.4–5.3)
Sodium: 138 mmol/L (ref 137–147)

## 2015-01-10 IMAGING — CR DG CHEST 1V PORT
1 series · 1 of 1 positions shown · non-contrast
Comparison: 11/11/2014.

CLINICAL DATA: Pneumonia.

EXAM:
PORTABLE CHEST - 1 VIEW

[AP]
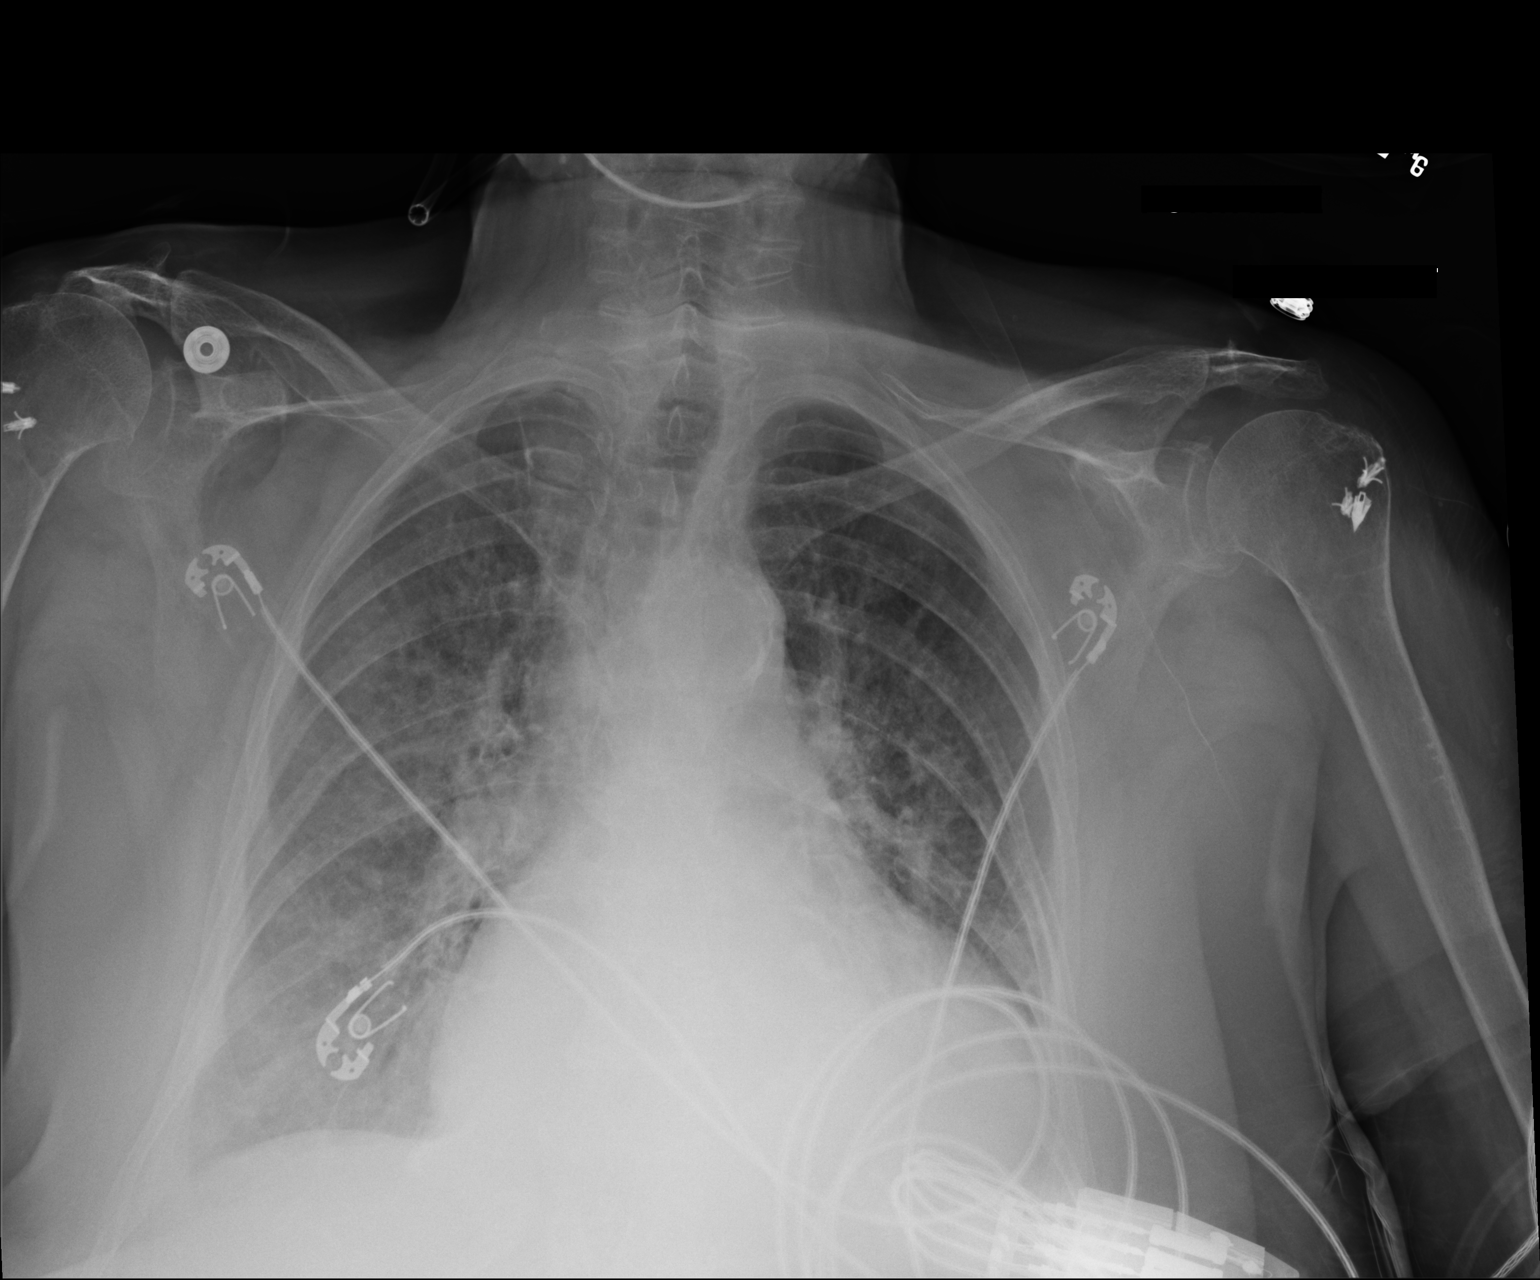

[1 of 1 positions shown; findings below may reference images not displayed]

FINDINGS: Mediastinum and hilar structures are normal. Cardiomegaly with
pulmonary venous congestion bilateral pulmonary interstitial
prominence with left-sided pleural effusion. The centric cyst with
congestive heart failure. Similar finding noted on prior exam. No
pneumothorax. No acute osseus abnormality. Postsurgical changes both
shoulders.
IMPRESSION: Congestive heart failure from interstitial edema. No significant
interim change from prior exam .

## 2015-01-13 IMAGING — CR DG CHEST 2V
2 series · 2 of 2 positions shown · non-contrast
Comparison: 11/13/2014 and CT 06/28/2010

CLINICAL DATA: Evaluate for pneumonia.

EXAM:
CHEST  2 VIEW

[chest lat]
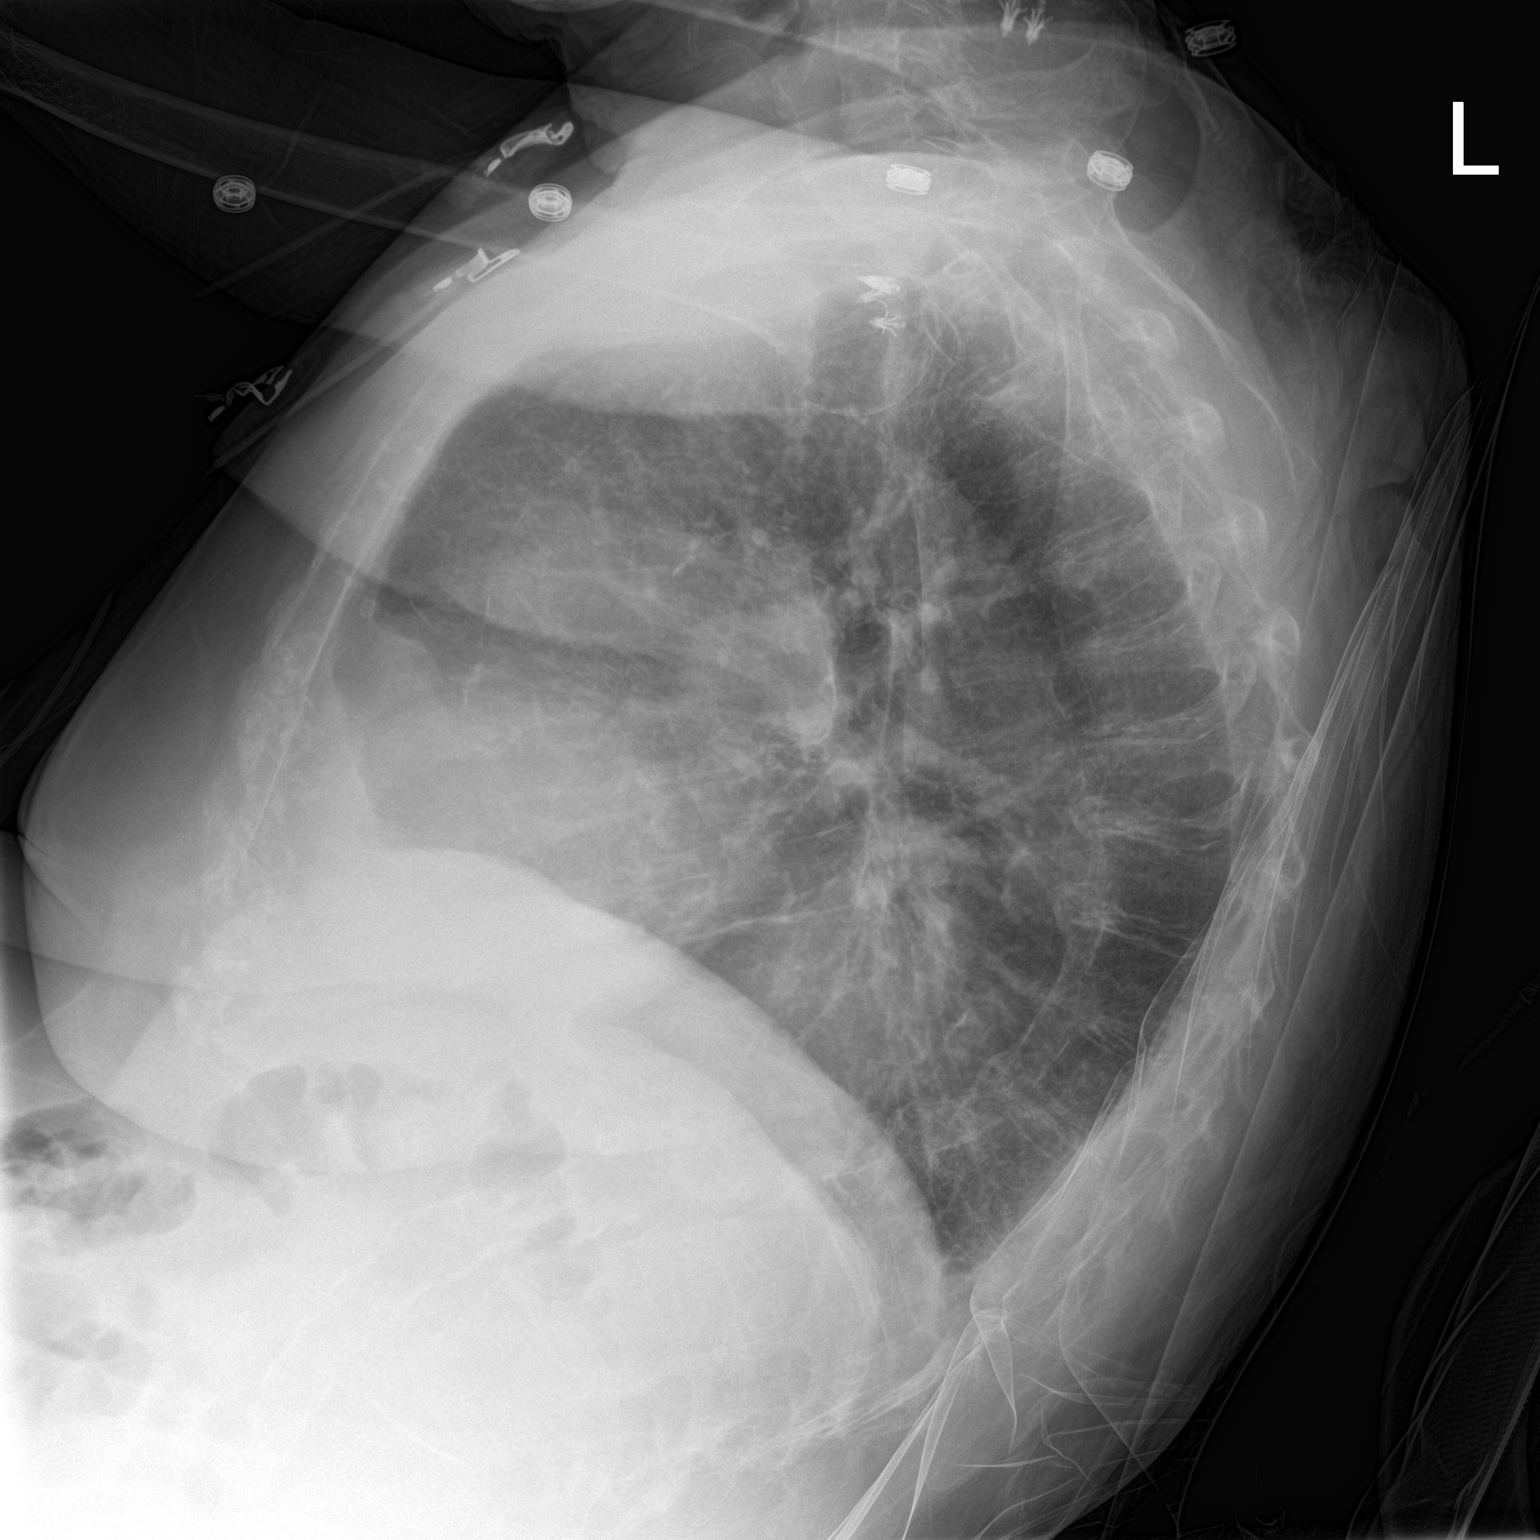

[chest ap]
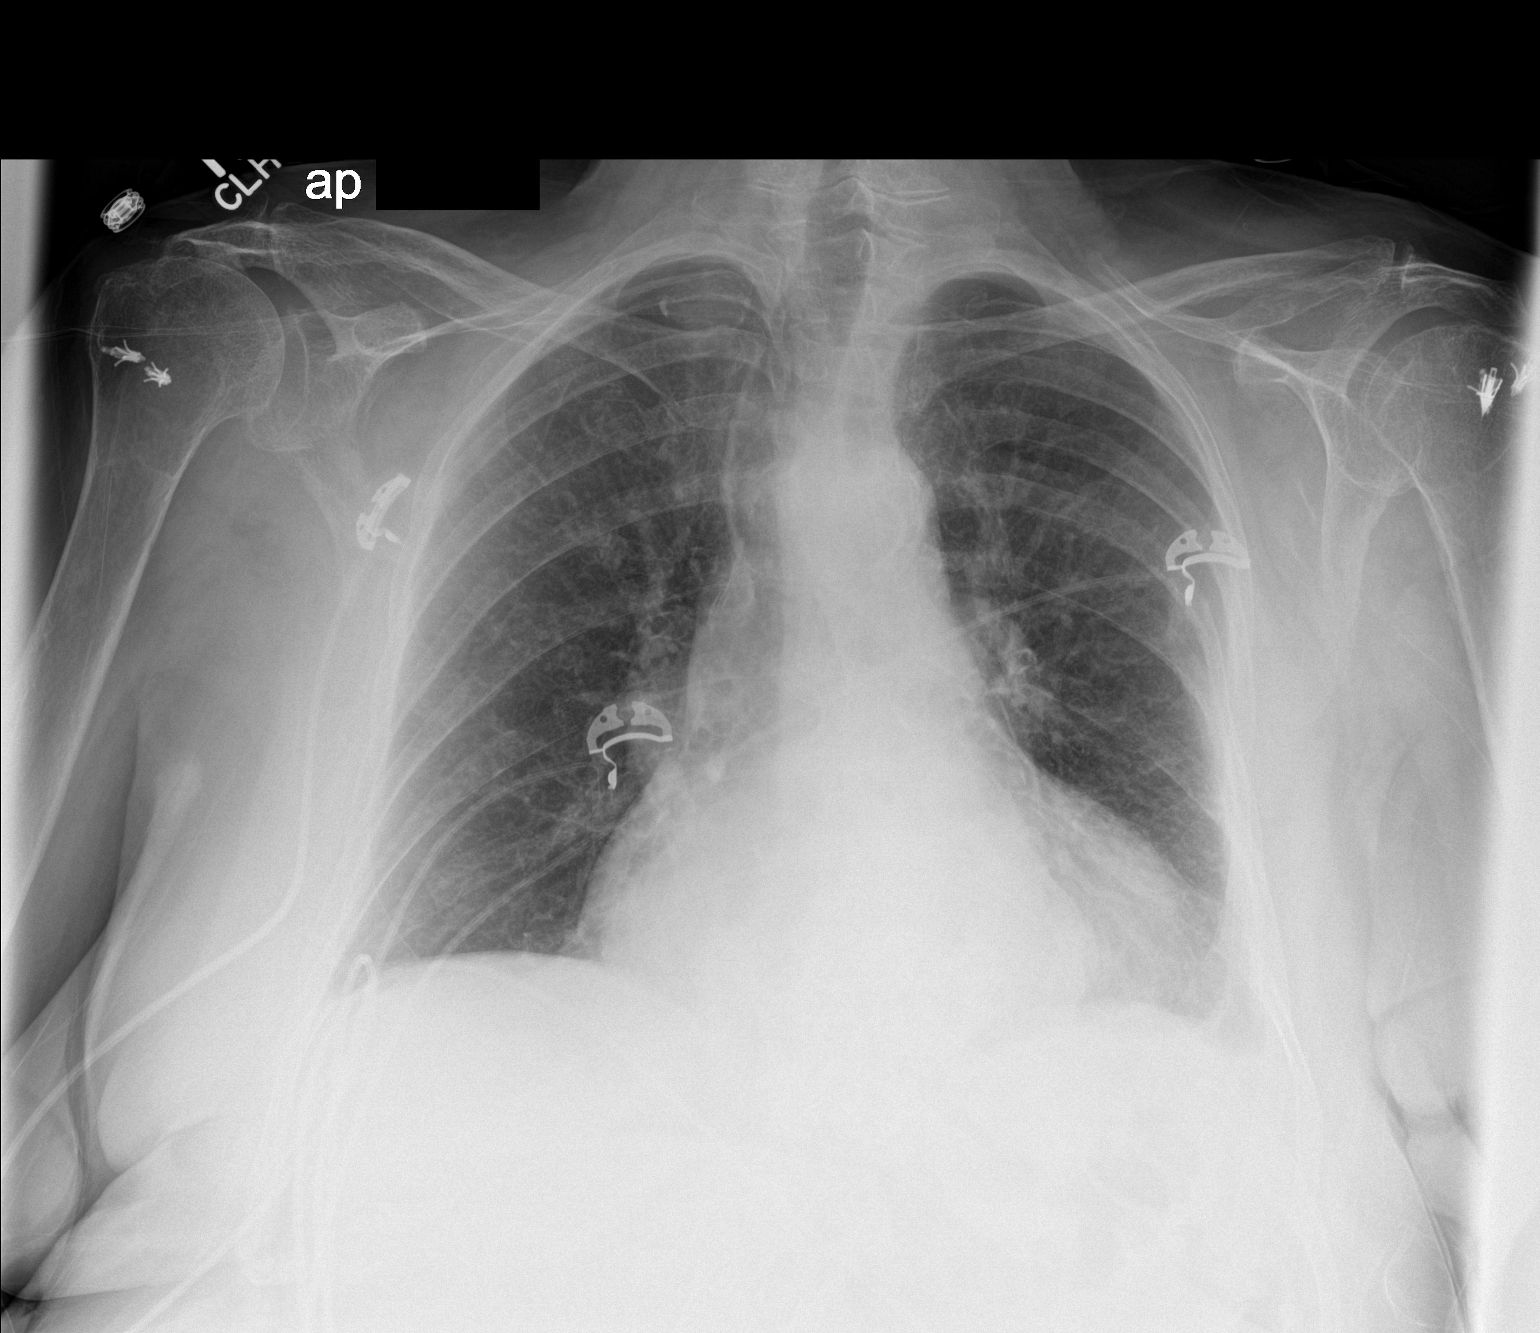

[2 of 2 positions shown; findings below may reference images not displayed]

FINDINGS: Lungs are adequately inflated with minimal blunting of the left
costophrenic angle which may represent minimal atelectasis or small
amount of pleural fluid. Evidence of patient's known hiatal hernia
in the left retrocardiac region. Stable cardiomegaly. Remainder the
exam is unchanged.
IMPRESSION: Minimal blunting of the left costophrenic angle which may be due to
small amount of pleural fluid versus atelectasis.

Hiatal hernia.

Mild cardiomegaly.

## 2015-01-20 ENCOUNTER — Encounter (INDEPENDENT_AMBULATORY_CARE_PROVIDER_SITE_OTHER): Payer: PRIVATE HEALTH INSURANCE | Admitting: Ophthalmology

## 2015-01-30 ENCOUNTER — Encounter: Payer: Self-pay | Admitting: Registered Nurse

## 2015-01-30 ENCOUNTER — Non-Acute Institutional Stay (SKILLED_NURSING_FACILITY): Payer: Medicare Other | Admitting: Registered Nurse

## 2015-01-30 DIAGNOSIS — M159 Polyosteoarthritis, unspecified: Secondary | ICD-10-CM

## 2015-01-30 DIAGNOSIS — I10 Essential (primary) hypertension: Secondary | ICD-10-CM | POA: Diagnosis not present

## 2015-01-30 DIAGNOSIS — I5032 Chronic diastolic (congestive) heart failure: Secondary | ICD-10-CM

## 2015-01-30 DIAGNOSIS — F32A Depression, unspecified: Secondary | ICD-10-CM

## 2015-01-30 DIAGNOSIS — E46 Unspecified protein-calorie malnutrition: Secondary | ICD-10-CM

## 2015-01-30 DIAGNOSIS — E039 Hypothyroidism, unspecified: Secondary | ICD-10-CM

## 2015-01-30 DIAGNOSIS — D638 Anemia in other chronic diseases classified elsewhere: Secondary | ICD-10-CM

## 2015-01-30 DIAGNOSIS — S72142D Displaced intertrochanteric fracture of left femur, subsequent encounter for closed fracture with routine healing: Secondary | ICD-10-CM | POA: Diagnosis not present

## 2015-01-30 DIAGNOSIS — F329 Major depressive disorder, single episode, unspecified: Secondary | ICD-10-CM | POA: Diagnosis not present

## 2015-01-30 DIAGNOSIS — K219 Gastro-esophageal reflux disease without esophagitis: Secondary | ICD-10-CM | POA: Diagnosis not present

## 2015-01-30 DIAGNOSIS — J9611 Chronic respiratory failure with hypoxia: Secondary | ICD-10-CM | POA: Diagnosis not present

## 2015-01-30 DIAGNOSIS — I480 Paroxysmal atrial fibrillation: Secondary | ICD-10-CM | POA: Diagnosis not present

## 2015-01-30 DIAGNOSIS — G4700 Insomnia, unspecified: Secondary | ICD-10-CM

## 2015-01-30 DIAGNOSIS — J309 Allergic rhinitis, unspecified: Secondary | ICD-10-CM | POA: Diagnosis not present

## 2015-01-30 DIAGNOSIS — E871 Hypo-osmolality and hyponatremia: Secondary | ICD-10-CM

## 2015-01-30 NOTE — Progress Notes (Signed)
Patient ID: Cindy [name], female   DOB: 11-28-1922, 81 y.o.   MRN: [id number]   Place of Service: [facility]  Allergies  Allergen Reactions  . Lipitor [Atorvastatin] Nausea Only and Other (See Comments)    LFT elevation  . Morphine And Related Other (See Comments)    "drives me crazy" and hyperactivity  . Irbesartan Swelling  . Ramipril Swelling    REACTION: lips swelling  . Telmisartan-Hctz Other (See Comments)    REACTION: incontinence  . Amiodarone Other (See Comments)    'terrible headaches'  . Hydrochlorothiazide Other (See Comments)    hyponatremia  . Metoprolol Other (See Comments)    Headache, dizzy, "terribly sick"  . Other Other (See Comments)    ANTICOAGULANTS - not a candidate due to history of falls and bleeding  . Tikosyn [Dofetilide] Other (See Comments)    Not a candidate due to Prolonged QT  . Norpace [Disopyramide] Other (See Comments)    Dry mouth    Code Status: DNR  Goals of Care: Comfort and Quality of Life/Hospice   Chief Complaint  Patient presents with  . Medical Management of Chronic Issues    CHF, HTN, anemia, OA, left hip fx, afib, chronic respiratory failure, insomnia, protein calorie malnutrition, depression     HPI  81 y.o. female with multiple complex PMH including paroxysmal afib, hypertrophic cardiomyopathy, UI, hypothyroidism, CHF, respiratory failure on chronic oxygen at 2lmp via [place], depression, left hip fracture, macular degeneration-legally blind in both eyes among others is being seen for a routine visit for management of her chronic issues. She is now under Hospice service. She continues to have gradual weight loss-5lbs since last routine visist. No recent fall or skin concerns reported. No change in behavior or functional status reported. No concerns from staff. Seen in room today. Reported having left hip pain and not feeling well. Denies any other concerns. CHF stable with no recent exacerbation. HTN stable with BP in  100-140s/70-90s. Afib stable with lopressor and amiodarone. GERD stable with ppi. Depression stable with remeron. No issues with insomnia on ambien prn. Anemia stable.   Review of Systems Constitutional: Negative for fever, chills, and fatigue.  HENT: Negative for ear pain, congestion, and sore throat  Eyes: Negative for eye pain and eye discharge. Positive for vision impairment-legally blind  Cardiovascular: Negative for chest pain, palpitations, and leg swelling  Respiratory: Negative cough, wheezing, shortness of breath. Gastrointestinal: Negative for nausea and vomiting. Negative for abdominal pain, diarrhea and constipation. Positive for reflux  Genitourinary: Negative for dysuria and hematuria  Endocrine: Negative for polydipsia, polyphagia, and polyuria  Musculoskeletal: Positive for generalized pain from OA and left hip pain Neurological: Negative for dizziness and headache  Skin: Negative for rash and wound.  Psychiatric: Negative for depression    Past Medical History  Diagnosis Date  . Atrial fibrillation 04/23-24/2007    a. recurrent PAF with RVR in September 2013. b. Evaluated 03/2013, previously intolerant to Norpace and Amiodarone - consider Multaq if recurs. c. Not on anticoag due to history of falls and also some internal bleeding per son.  . Hypertrophic cardiomyopathy     i am not sure SK 2015  . Urinary incontinence   . Diverticulosis of colon (without mention of hemorrhage) 2003/ 08/2000    EGD/colonoscopy Barretts esophagus//H.H divertics 08/2000  . Cervical mass     C2 lateral mass fracture  . Hypertension   . HLD (hyperlipidemia)     219/497  . Hypothyroidism   .  Multinodular goiter (nontoxic)   . Osteoporosis   . Jaundice ~ 1935    "in grade school"  . Degenerative joint disease   . Depression   . Personal history of colonic polyps 02/29/2012    tubular adenoma  . Barrett's esophagus   . CAD (coronary artery disease)     a. NSTEMI 03/2013: 2/2 diagonal  disease (small, not amenable to PCI, for med rx).  . Moderate mitral regurgitation 2014  . Mild aortic stenosis 2014  . HOH (hard of hearing)   . Anemia, iron deficiency   . GERD (gastroesophageal reflux disease)   . Legally blind     "both eyes/Dr. Zigmund Daniel" (11/11/2014)  . Macular degeneration of both eyes     Past Surgical History  Procedure Laterality Date  . Bladder surgery  1990's    bladder tack early 90's  . Tear duct probing  07/29/03    tear duct surg  . Cystourethroscopy  09/17/03  . Rotator cuff repair Bilateral ? date; 09/07/05    left; right( Dr. Gladstone Lighter)  . Appendectomy  1941  . Thyroid ultrasound  10/14/2003    MNG, no dominant masses  . Doppler echocardiography  03/05/2002&09/11/2003    ECHO, EF wnl, mild stenosis, A.S. mild MR, Mild T.R03/31/2003//ECHO EF 70%,LVH, ?diast dysfunction 09/11/2003  . Cataract extraction, bilateral  2003  . Dilation and curettage of uterus  09/07/2000    endometrial polyps removed, path all benign   . Tonsillectomy and adenoidectomy      "as a child"  . Fracture surgery  2010    right knee  . Knee arthroscopy Right   . Reduction mammaplasty  1981  . Incontinence surgery      "she's had a tack and a sling"  . Left heart catheterization with coronary angiogram N/A 03/27/2013    Procedure: LEFT HEART CATHETERIZATION WITH CORONARY ANGIOGRAM;  Surgeon: Peter M Martinique, MD;  Location: [facility] CATH LAB;  Service: Cardiovascular;  Laterality: N/A;  . Femur im nail Left 11/28/2014    Procedure: INTRAMEDULLARY  NAIL Left FEMORAL;  Surgeon: Marybelle Killings, MD;  Location: [place];  Service: Orthopedics;  Laterality: Left;    History  Substance Use Topics  . Smoking status: Former Smoker -- 0.50 packs/day for 4 years    Types: Cigarettes    Quit date: 07/06/1974  . Smokeless tobacco: Never Used  . Alcohol Use: No    Family History  Problem Relation Age of Onset  . Heart failure Mother     CHF, DM, HBP  . Hypertension Mother   . Uterine  cancer Mother   . Stroke Mother   . Colon cancer Neg Hx   . Esophageal cancer Neg Hx   . Rectal cancer Neg Hx   . Stomach cancer Neg Hx       Medication List       This list is accurate as of: 01/30/15  4:33 PM.  Always use your most recent med list.               acetaminophen 325 MG tablet  Commonly known as:  TYLENOL  Take 650 mg by mouth 2 (two) times daily.     amiodarone 200 MG tablet  Commonly known as:  PACERONE  Take 1 tablet (200 mg total) by mouth 2 (two) times daily.     ARTIFICIAL TEARS OP  Place 1 drop into both eyes 2 (two) times daily.     aspirin 81 MG chewable tablet  Chew 1 tablet (81 mg total) by mouth daily.     diltiazem 30 MG tablet  Commonly known as:  CARDIZEM  Take 1 tablet (30 mg total) by mouth every 6 (six) hours as needed (rapid heart rate.).     fluticasone 50 MCG/ACT nasal spray  Commonly known as:  FLONASE  Place 1 spray into both nostrils daily.     furosemide 40 MG tablet  Commonly known as:  LASIX  Take 40 mg by mouth daily.     HYDROcodone-acetaminophen 5-325 MG per tablet  Commonly known as:  NORCO/VICODIN  Take 1/2 tablet by mouth twice daily as needed for moderate pain; Take one tablet by mouth twice daily as needed for severe pain     levothyroxine 25 MCG tablet  Commonly known as:  SYNTHROID, LEVOTHROID  Take 1 tablet (25 mcg total) by mouth every morning.     losartan 25 MG tablet  Commonly known as:  COZAAR  Take 12.5 mg by mouth daily.     metoprolol tartrate 25 MG tablet  Commonly known as:  LOPRESSOR  Take 12.5 mg by mouth 2 (two) times daily.     mirtazapine 15 MG tablet  Commonly known as:  REMERON  Take 15 mg by mouth at bedtime.     omeprazole 40 MG capsule  Commonly known as:  PRILOSEC  Take 40 mg by mouth daily.     potassium chloride SA 20 MEQ tablet  Commonly known as:  K-DUR,KLOR-CON  Take 40 mEq by mouth daily.     zolpidem 5 MG tablet  Commonly known as:  AMBIEN  Take 1 tablet (5 mg  total) by mouth at bedtime as needed for sleep.        Physical Exam  BP 138/61 mmHg  Pulse 60  Temp(Src) 97.4 F (36.3 C)  Resp 20  Ht 5' (1.524 m)  Wt 121 lb 6.4 oz (55.067 kg)  BMI 23.71 kg/m2  SpO2 95%  Constitutional: Frail elderly female in no acute distress. Conversant and pleasant  HEENT: Normocephalic and atraumatic. PERRL. EOM intact. No icterus. Legally blind. Hard of hearing. No nasal discharge or sinus tenderness. Oral mucosa moist. Posterior pharynx clear of any exudate or lesions.  Neck: Supple and nontender. No lymphadenopathy, masses, or thyromegaly. No JVD or carotid bruits.  Cardiac: Normal S1, S2. RRR with 2/6 systolic mumur. Distal pulses intact. No dependent edema.  Lungs: No respiratory distress. Breath sounds diminished bilaterally with wheezes throughout. Oxygen via [place] in place  Abdomen: Audible bowel sounds in all quadrants. Soft, nontender, nondistended.  Musculoskeletal: able to move all extremities. Generalized weakness present.  Skin: Warm and dry. No rash noted. No erythema. Left hip surgical incision healed-no signs of infection noted. Left hip still tender to palpation  Neurological: Alert and oriented to person.  Psychiatric: Appropriate mood and affect.    Labs Reviewed  CBC Latest Ref Rng 12/05/2014 12/03/2014 12/01/2014  WBC - 6.8 5.7 7.2  Hemoglobin 12.0 - 16.0 g/dL 10.2(A) 10.6(A) 9.5(L)  Hematocrit 36 - 46 % 33(A) 36 31.1(L)  Platelets 150 - 399 K/L 337 251 191    CMP Latest Ref Rng 01/02/2015 12/05/2014 12/03/2014  Glucose 70 - 99 mg/dL - - -  BUN 4 - 21 mg/dL 19 23(A) 14  Creatinine 0.5 - 1.1 mg/dL 0.9 0.9 0.8  Sodium 137 - 147 mmol/L 138 130(A) 131(A)  Potassium 3.4 - 5.3 mmol/L 4.0 3.8 4.0  Chloride 96 - 112 mEq/L - - -  CO2 19 - 32 mmol/L - - -  Calcium 8.4 - 10.5 mg/dL - - -  Total Protein 6.0 - 8.3 g/dL - - -  Total Bilirubin 0.3 - 1.2 mg/dL - - -  Alkaline Phos 39 - 117 U/L - - -  AST 0 - 37 U/L - - -  ALT 0 - 35 U/L -  - -    Lab Results  Component Value Date   TSH 1.200 11/13/2014   Assessment & Plan 1. Protein-calorie malnutrition Continues with gradual weight lost. Poor prognosis r/t multiple comorbidities. With goal of care being comfort only, will not make any changes at this time. Continue current diet and dietary supplement.   2. Hypothyroidism, unspecified hypothyroidism type Stable. Continue to levothyroxine 57mcg daily  3. Depression Mood stable. Continue remeron 15mg  daily for depression and appetite. Continue to monitor for change in mood  4. Anemia of chronic disease Stable. Continue to monitor h&h as necessary  5. Chronic diastolic CHF (congestive heart failure) Stable. No recent exacerbation. Continue lopressor 12.5mg  twice daily, losartan 12.5mg  daily, and lasix 40mg  daily with potassium 62meq daily.   6. Insomnia Stable. Continue ambien 5mg  nightly as needed. Continue to monitor  7. Gastroesophageal reflux disease, esophagitis presence not specified Stable. Continue omeprazole 40mg  daily.  8. Essential hypertension Stable. Continue losartan 12.5mg  daily, lasix 40mg  daily, and lopressor 12.5mg  twice daily.   9. Generalized OA Stable. Continue tylenol 650mg  twice daily with norco 5/325mg  1/2-1 tab twice daily as needed for pain Continue to monitor.   10. Intertrochanteric fracture of left hip, closed, with routine healing, subsequent encounter Stable. Continue tylenol 650mg  twice daily with norco 5/325mg  1/2-1 tab twice daily as needed for moderate to severe pain.   11. Paroxysmal atrial fibrillation Stable. Rate controlled. Continue amiodarone 200mg  twice daily, lopressor 12.5mg  twice daily, asa 81mg  daily, and cardizem 30mg  every six hours as needed for HR>120. Continue to monitor   12. Hyponatremia Resolved.   13. Allergic rhinitis Stable. Continue flonase nasal spray in each nare daily.   14. Chronic respiratory failure with hypoxia Stable. Continue continuous  oxygen therapy at 2lpm via nasal cannula to maintain O2 sat above 88%. Continue to monitor her status.    Family/Staff Communication Plan of care discussed with patient and nursing staff. Patient and nursing staff verbalized understanding and agree with plan of care. No additional questions or concerns reported.    Arthur Holms, MSN, AGNP-C [facility] [address] [place], [place] [address] [phone] [8am-5pm] After hours: [phone]

## 2015-02-03 ENCOUNTER — Other Ambulatory Visit: Payer: Self-pay | Admitting: *Deleted

## 2015-02-03 MED ORDER — HYDROCODONE-ACETAMINOPHEN 5-325 MG PO TABS: ORAL_TABLET | ORAL | Status: DC

## 2015-02-03 NOTE — Telephone Encounter (Signed)
[facility] 

## 2015-02-19 ENCOUNTER — Ambulatory Visit: Payer: Medicare Other | Admitting: Cardiology

## 2015-02-21 ENCOUNTER — Other Ambulatory Visit: Payer: Self-pay

## 2015-02-21 MED ORDER — MORPHINE SULFATE (CONCENTRATE) 20 MG/ML PO SOLN: ORAL | Status: AC

## 2015-02-21 NOTE — Telephone Encounter (Signed)
Rx faxed to [facility] @ [phone], phone number [phone]  

## 2015-02-26 ENCOUNTER — Non-Acute Institutional Stay (SKILLED_NURSING_FACILITY): Payer: Medicare Other | Admitting: Registered Nurse

## 2015-02-26 DIAGNOSIS — G4700 Insomnia, unspecified: Secondary | ICD-10-CM | POA: Diagnosis not present

## 2015-02-26 DIAGNOSIS — F32A Depression, unspecified: Secondary | ICD-10-CM

## 2015-02-26 DIAGNOSIS — F329 Major depressive disorder, single episode, unspecified: Secondary | ICD-10-CM | POA: Diagnosis not present

## 2015-02-26 DIAGNOSIS — M159 Polyosteoarthritis, unspecified: Secondary | ICD-10-CM

## 2015-02-26 DIAGNOSIS — E039 Hypothyroidism, unspecified: Secondary | ICD-10-CM

## 2015-02-26 DIAGNOSIS — I5032 Chronic diastolic (congestive) heart failure: Secondary | ICD-10-CM

## 2015-02-26 DIAGNOSIS — J309 Allergic rhinitis, unspecified: Secondary | ICD-10-CM

## 2015-02-26 DIAGNOSIS — J9611 Chronic respiratory failure with hypoxia: Secondary | ICD-10-CM | POA: Diagnosis not present

## 2015-02-26 DIAGNOSIS — E46 Unspecified protein-calorie malnutrition: Secondary | ICD-10-CM | POA: Diagnosis not present

## 2015-02-26 DIAGNOSIS — I480 Paroxysmal atrial fibrillation: Secondary | ICD-10-CM

## 2015-02-26 DIAGNOSIS — K219 Gastro-esophageal reflux disease without esophagitis: Secondary | ICD-10-CM | POA: Diagnosis not present

## 2015-02-26 DIAGNOSIS — I10 Essential (primary) hypertension: Secondary | ICD-10-CM

## 2015-02-26 DIAGNOSIS — D638 Anemia in other chronic diseases classified elsewhere: Secondary | ICD-10-CM

## 2015-02-26 DIAGNOSIS — S72142S Displaced intertrochanteric fracture of left femur, sequela: Secondary | ICD-10-CM

## 2015-02-27 ENCOUNTER — Encounter: Payer: Self-pay | Admitting: Registered Nurse

## 2015-02-27 NOTE — Progress Notes (Signed)
Patient ID: Cindy [name], female   DOB: 17-Jul-1922, 81 y.o.   MRN: [id number]   Place of Service: [facility]  Allergies  Allergen Reactions  . Lipitor [Atorvastatin] Nausea Only and Other (See Comments)    LFT elevation  . Morphine And Related Other (See Comments)    "drives me crazy" and hyperactivity  . Irbesartan Swelling  . Ramipril Swelling    REACTION: lips swelling  . Telmisartan-Hctz Other (See Comments)    REACTION: incontinence  . Amiodarone Other (See Comments)    'terrible headaches'  . Hydrochlorothiazide Other (See Comments)    hyponatremia  . Metoprolol Other (See Comments)    Headache, dizzy, "terribly sick"  . Other Other (See Comments)    ANTICOAGULANTS - not a candidate due to history of falls and bleeding  . Tikosyn [Dofetilide] Other (See Comments)    Not a candidate due to Prolonged QT  . Norpace [Disopyramide] Other (See Comments)    Dry mouth    Code Status: DNR  Goals of Care: Comfort and Quality of Life/Hospice   Chief Complaint  Patient presents with  . Medical Management of Chronic Issues    Left hip fx, CHF, HTN, hypothyroidism, GERD, OA, afib, AR, chronic respiratory failure, depression, anemia    HPI  81 y.o. female Hospice patient with multiple complex PMH including paroxysmal afib, hypertrophic cardiomyopathy, UI, hypothyroidism, CHF, respiratory failure on chronic oxygen at 2lmp via [place], depression, left hip fracture, macular degeneration-legally blind in both eyes among others is being seen for a routine visit for management of her chronic issues. Weight stable over the past 30 days. No recent fall or skin concerns reported. No change in behavior or functional status reported. No concerns from staff. Seen in room today. Caregiver at bedside. Reported that left hip pain is adequately controlled with current regimen. No recent exacerbation. BP range 100-130s/60s. Afib stable with lopressor and amiodarone. GERD stable with ppi.  Mood stable with remeron. Anemia stable.  Review of Systems Constitutional: Negative for fever, chills, and fatigue.  HENT: Negative for ear pain, congestion, and sore throat  Eyes: Negative for eye pain and eye discharge. Positive for vision impairment-legally blind  Cardiovascular: Negative for chest pain, palpitations, and leg swelling  Respiratory: Negative cough, wheezing, shortness of breath. Gastrointestinal: Negative for nausea and vomiting. Negative for abdominal pain, diarrhea and constipation. Positive for reflux  Genitourinary: Negative for dysuria and hematuria  Endocrine: Negative for polydipsia, polyphagia, and polyuria  Musculoskeletal: Positive for generalized pain from OA and left hip pain Neurological: Negative for dizziness and headache  Skin: Negative for rash and wound.  Psychiatric: Negative for depression    Past Medical History  Diagnosis Date  . Atrial fibrillation 04/23-24/2007    a. recurrent PAF with RVR in September 2013. b. Evaluated 03/2013, previously intolerant to Norpace and Amiodarone - consider Multaq if recurs. c. Not on anticoag due to history of falls and also some internal bleeding per son.  . Hypertrophic cardiomyopathy     i am not sure SK 2015  . Urinary incontinence   . Diverticulosis of colon (without mention of hemorrhage) 2003/ 08/2000    EGD/colonoscopy Barretts esophagus//H.H divertics 08/2000  . Cervical mass     C2 lateral mass fracture  . Hypertension   . HLD (hyperlipidemia)     219/497  . Hypothyroidism   . Multinodular goiter (nontoxic)   . Osteoporosis   . Jaundice ~ 1935    "in grade school"  .  Degenerative joint disease   . Depression   . Personal history of colonic polyps 02/29/2012    tubular adenoma  . Barrett's esophagus   . CAD (coronary artery disease)     a. NSTEMI 03/2013: 2/2 diagonal disease (small, not amenable to PCI, for med rx).  . Moderate mitral regurgitation 2014  . Mild aortic stenosis 2014  . HOH  (hard of hearing)   . Anemia, iron deficiency   . GERD (gastroesophageal reflux disease)   . Legally blind     "both eyes/Dr. Zigmund Daniel" (11/11/2014)  . Macular degeneration of both eyes     Past Surgical History  Procedure Laterality Date  . Bladder surgery  1990's    bladder tack early 90's  . Tear duct probing  07/29/03    tear duct surg  . Cystourethroscopy  09/17/03  . Rotator cuff repair Bilateral ? date; 09/07/05    left; right( Dr. Gladstone Lighter)  . Appendectomy  1941  . Thyroid ultrasound  10/14/2003    MNG, no dominant masses  . Doppler echocardiography  03/05/2002&09/11/2003    ECHO, EF wnl, mild stenosis, A.S. mild MR, Mild T.R03/31/2003//ECHO EF 70%,LVH, ?diast dysfunction 09/11/2003  . Cataract extraction, bilateral  2003  . Dilation and curettage of uterus  09/07/2000    endometrial polyps removed, path all benign   . Tonsillectomy and adenoidectomy      "as a child"  . Fracture surgery  2010    right knee  . Knee arthroscopy Right   . Reduction mammaplasty  1981  . Incontinence surgery      "she's had a tack and a sling"  . Left heart catheterization with coronary angiogram N/A 03/27/2013    Procedure: LEFT HEART CATHETERIZATION WITH CORONARY ANGIOGRAM;  Surgeon: Peter M Martinique, MD;  Location: [facility] CATH LAB;  Service: Cardiovascular;  Laterality: N/A;  . Femur im nail Left 11/28/2014    Procedure: INTRAMEDULLARY  NAIL Left FEMORAL;  Surgeon: Marybelle Killings, MD;  Location: [place];  Service: Orthopedics;  Laterality: Left;    History  Substance Use Topics  . Smoking status: Former Smoker -- 0.50 packs/day for 4 years    Types: Cigarettes    Quit date: 07/06/1974  . Smokeless tobacco: Never Used  . Alcohol Use: No    Family History  Problem Relation Age of Onset  . Heart failure Mother     CHF, DM, HBP  . Hypertension Mother   . Uterine cancer Mother   . Stroke Mother   . Colon cancer Neg Hx   . Esophageal cancer Neg Hx   . Rectal cancer Neg Hx   . Stomach  cancer Neg Hx       Medication List       This list is accurate as of: 02/26/15 11:59 PM.  Always use your most recent med list.               acetaminophen 325 MG tablet  Commonly known as:  TYLENOL  Take 650 mg by mouth 3 (three) times daily.     amiodarone 200 MG tablet  Commonly known as:  PACERONE  Take 1 tablet (200 mg total) by mouth 2 (two) times daily.     ARTIFICIAL TEARS OP  Place 1 drop into both eyes 2 (two) times daily.     aspirin 81 MG chewable tablet  Chew 1 tablet (81 mg total) by mouth daily.     diltiazem 30 MG tablet  Commonly known as:  CARDIZEM  Take 1 tablet (30 mg total) by mouth every 6 (six) hours as needed (rapid heart rate.).     fluticasone 50 MCG/ACT nasal spray  Commonly known as:  FLONASE  Place 1 spray into both nostrils daily.     furosemide 40 MG tablet  Commonly known as:  LASIX  Take 40 mg by mouth daily.     levothyroxine 25 MCG tablet  Commonly known as:  SYNTHROID, LEVOTHROID  Take 1 tablet (25 mcg total) by mouth every morning.     losartan 25 MG tablet  Commonly known as:  COZAAR  Take 12.5 mg by mouth daily.     metoprolol tartrate 25 MG tablet  Commonly known as:  LOPRESSOR  Take 12.5 mg by mouth 2 (two) times daily.     mirtazapine 15 MG tablet  Commonly known as:  REMERON  Take 15 mg by mouth at bedtime.     morphine 20 MG/ML concentrated solution  Commonly known as:  ROXANOL  Take 0.25 ml = 5 mg by mouth every 4 hours as needed for pain/shortness of breath DOUBLE CHECK DOSE, USE CALIBRATED SYRINGE     omeprazole 40 MG capsule  Commonly known as:  PRILOSEC  Take 40 mg by mouth daily.     potassium chloride SA 20 MEQ tablet  Commonly known as:  K-DUR,KLOR-CON  Take 40 mEq by mouth daily.     zolpidem 5 MG tablet  Commonly known as:  AMBIEN  Take 1 tablet (5 mg total) by mouth at bedtime as needed for sleep.        Physical Exam  BP 129/68 mmHg  Pulse 57  Temp(Src) 98.7 F (37.1 C)  Resp 20   Ht 5' (1.524 m)  Wt 120 lb (54.432 kg)  BMI 23.44 kg/m2  SpO2 95%  Constitutional: Frail elderly female in no acute distress. Conversant and pleasant  HEENT: Normocephalic and atraumatic. PERRL. EOM intact. No icterus. Legally blind. Hard of hearing. No nasal discharge or sinus tenderness. Oral mucosa moist. Posterior pharynx clear of any exudate or lesions.  Neck: Supple and nontender. No lymphadenopathy, masses, or thyromegaly. No JVD or carotid bruits.  Cardiac: Normal S1, S2. RRR with 2/6 systolic mumur. Distal pulses intact. No dependent edema.  Lungs: No respiratory distress. Breath sounds diminished bilaterally with wheezes throughout. Oxygen via [place] in place  Abdomen: Audible bowel sounds in all quadrants. Soft, nontender, nondistended.  Musculoskeletal: able to move all extremities. Generalized weakness present.  Skin: Warm and dry. No rash noted. No erythema. Left hip surgical incision healed-no signs of infection noted. Left hip still tender to palpation  Neurological: Alert and oriented to self.  Psychiatric: Appropriate mood and affect.    Labs Reviewed  CBC Latest Ref Rng 12/12/2014 12/05/2014 12/03/2014  WBC - 6.8 6.8 5.7  Hemoglobin 12.0 - 16.0 g/dL 10.2(A) 10.2(A) 10.6(A)  Hematocrit 36 - 46 % 33(A) 33(A) 36  Platelets 150 - 399 K/L 332 337 251    CMP Latest Ref Rng 01/02/2015 12/05/2014 12/03/2014  Glucose 70 - 99 mg/dL - - -  BUN 4 - 21 mg/dL 19 23(A) 14  Creatinine 0.5 - 1.1 mg/dL 0.9 0.9 0.8  Sodium 137 - 147 mmol/L 138 130(A) 131(A)  Potassium 3.4 - 5.3 mmol/L 4.0 3.8 4.0  Chloride 96 - 112 mEq/L - - -  CO2 19 - 32 mmol/L - - -  Calcium 8.4 - 10.5 mg/dL - - -  Total Protein 6.0 - 8.3 g/dL - - -  Total Bilirubin 0.3 - 1.2 mg/dL - - -  Alkaline Phos 39 - 117 U/L - - -  AST 0 - 37 U/L - - -  ALT 0 - 35 U/L - - -    Lab Results  Component Value Date   TSH 1.200 11/13/2014   Assessment & Plan 1. Protein-calorie malnutrition Weight stable over the past 30  days (only has 1 lbs weight loss). Continue current diet and dietary supplement.   2. Hypothyroidism, unspecified hypothyroidism type Stable. Continue to levothyroxine 21mcg daily  3. Depression Mood stable. Continue remeron 15mg  daily. Continue to monitor for change in mood  4. Anemia of chronic disease Stable. Recent h&h 10.2/33. Continue to monitor h&h as indicated  5. Chronic diastolic CHF (congestive heart failure) No recent exacerbation. Appears euvolemic on exam. Continue lopressor 12.5mg  twice daily, losartan 12.5mg  daily, and lasix 40mg  daily with potassium 59meq daily. Continue to monitor her status  6. Insomnia Stable. Continue ambien 5mg  nightly as needed.   7. Gastroesophageal reflux disease, esophagitis presence not specified Stable. Continue omeprazole 40mg  daily.  8. Essential hypertension Stable. Continue losartan 12.5mg  daily, lasix 40mg  daily, and lopressor 12.5mg  twice daily.   9. Generalized OA Stable.Continue tylenol 650mg  three times daily with roxanol 5mg  every four hours as needed for pain.  10. Intertrochanteric fracture of left hip, closed, sequela Pain is adequately controlled. Continue tylenol 650mg  three times daily with roxanol 5mg  every four hours as needed for pain and shortness of breath. Continue to monitor  11. Paroxysmal atrial fibrillation Stable. Rate controlled. Continue amiodarone 200mg  twice daily, lopressor 12.5mg  twice daily, asa 81mg  daily, and cardizem 30mg  every six hours as needed for HR>120. Continue to monitor   12. Allergic rhinitis Stable. Continue flonase nasal spray in each nare daily.   13. Chronic respiratory failure with hypoxia Stable. Continue oxygen therapy at 2lpm via nasal cannula to maintain O2 sat above 88%.    Family/Staff Communication Plan of care discussed with patient and nursing staff. Patient and nursing staff verbalized understanding and agree with plan of care. No additional questions or concerns  reported.    Arthur Holms, MSN, AGNP-C [facility] [address] [place], [place] [address] [phone] [8am-5pm] After hours: [phone]

## 2015-03-28 ENCOUNTER — Non-Acute Institutional Stay (SKILLED_NURSING_FACILITY): Payer: Medicare Other | Admitting: Registered Nurse

## 2015-03-28 ENCOUNTER — Encounter: Payer: Self-pay | Admitting: Registered Nurse

## 2015-03-28 DIAGNOSIS — M159 Polyosteoarthritis, unspecified: Secondary | ICD-10-CM | POA: Diagnosis not present

## 2015-03-28 DIAGNOSIS — J9611 Chronic respiratory failure with hypoxia: Secondary | ICD-10-CM | POA: Diagnosis not present

## 2015-03-28 DIAGNOSIS — S72142S Displaced intertrochanteric fracture of left femur, sequela: Secondary | ICD-10-CM

## 2015-03-28 DIAGNOSIS — I480 Paroxysmal atrial fibrillation: Secondary | ICD-10-CM

## 2015-03-28 DIAGNOSIS — D638 Anemia in other chronic diseases classified elsewhere: Secondary | ICD-10-CM

## 2015-03-28 DIAGNOSIS — E039 Hypothyroidism, unspecified: Secondary | ICD-10-CM

## 2015-03-28 DIAGNOSIS — I5032 Chronic diastolic (congestive) heart failure: Secondary | ICD-10-CM

## 2015-03-28 DIAGNOSIS — E46 Unspecified protein-calorie malnutrition: Secondary | ICD-10-CM | POA: Diagnosis not present

## 2015-03-28 DIAGNOSIS — F329 Major depressive disorder, single episode, unspecified: Secondary | ICD-10-CM

## 2015-03-28 DIAGNOSIS — I10 Essential (primary) hypertension: Secondary | ICD-10-CM

## 2015-03-28 DIAGNOSIS — G4700 Insomnia, unspecified: Secondary | ICD-10-CM | POA: Diagnosis not present

## 2015-03-28 DIAGNOSIS — R0989 Other specified symptoms and signs involving the circulatory and respiratory systems: Secondary | ICD-10-CM | POA: Diagnosis not present

## 2015-03-28 DIAGNOSIS — F32A Depression, unspecified: Secondary | ICD-10-CM

## 2015-03-28 DIAGNOSIS — K219 Gastro-esophageal reflux disease without esophagitis: Secondary | ICD-10-CM | POA: Diagnosis not present

## 2015-03-28 NOTE — Progress Notes (Signed)
Patient ID: Cindy [name], female   DOB: [birth year], 81 y.o.   MRN: [id number]   Place of Service: [facility]  Allergies  Allergen Reactions  . Lipitor [Atorvastatin] Nausea Only and Other (See Comments)    LFT elevation  . Morphine And Related Other (See Comments)    "drives me crazy" and hyperactivity  . Irbesartan Swelling  . Ramipril Swelling    REACTION: lips swelling  . Telmisartan-Hctz Other (See Comments)    REACTION: incontinence  . Amiodarone Other (See Comments)    'terrible headaches'  . Hydrochlorothiazide Other (See Comments)    hyponatremia  . Metoprolol Other (See Comments)    Headache, dizzy, "terribly sick"  . Other Other (See Comments)    ANTICOAGULANTS - not a candidate due to history of falls and bleeding  . Tikosyn [Dofetilide] Other (See Comments)    Not a candidate due to Prolonged QT  . Norpace [Disopyramide] Other (See Comments)    Dry mouth    Code Status: DNR  Goals of Care: Comfort and Quality of Life/Hospice   Chief Complaint  Patient presents with  . Medical Management of Chronic Issues    HTN, hypothyroidism, depression, PCM, anemia, L hip fx, GERD, OA, afib    HPI  81 y.o. female Hospice patient with multiple complex PMH including paroxysmal afib, hypertrophic cardiomyopathy, UI, hypothyroidism, CHF, respiratory failure on chronic oxygen at 2lmp via [place], depression, left hip fracture, macular degeneration-legally blind in both eyes among others is being seen for a routine visit for management of her chronic issues. Continues to have gradual weight loss-1lb over the past 30 days. No recent fall or skin concerns reported. Per nursing staff, patient seems to be more depressed as she has been in bed/staying in her room more over the past couple weeks. Seen in room today. Reported feeling fine and stated that she does not want her antidepressant to be increased. Left hip pain is adequately controlled with current regimen. BP range  100-130s/60s. Afib stable with lopressor and amiodarone. GERD stable with ppi.  Spoke to son/HPOA, Ronalee Belts and he expressed some concerns about her mutiple medications. He would like to speak to patient first regarding what medications she would like to discontinue.   Review of Systems Constitutional: Negative for fever, chills, and fatigue.  HENT: Negative for ear pain, congestion, and sore throat  Eyes: Negative for eye pain and eye discharge. Positive for vision impairment-legally blind  Cardiovascular: Negative for chest pain, palpitations, and leg swelling  Respiratory: Negative cough, wheezing, shortness of breath. Gastrointestinal: Negative for nausea and vomiting. Negative for abdominal pain, diarrhea and constipation. Positive for reflux  Genitourinary: Negative for dysuria and hematuria  Endocrine: Negative for polydipsia, polyphagia, and polyuria  Musculoskeletal: Negative for uncontrolled pain Neurological: Negative for dizziness and headache  Skin: Negative for rash and wound.  Psychiatric: Negative for depression.     Past Medical History  Diagnosis Date  . Atrial fibrillation 04/23-24/2007    a. recurrent PAF with RVR in September 2013. b. Evaluated 03/2013, previously intolerant to Norpace and Amiodarone - consider Multaq if recurs. c. Not on anticoag due to history of falls and also some internal bleeding per son.  . Hypertrophic cardiomyopathy     i am not sure SK 2015  . Urinary incontinence   . Diverticulosis of colon (without mention of hemorrhage) 2003/ 08/2000    EGD/colonoscopy Barretts esophagus//H.H divertics 08/2000  . Cervical mass     C2 lateral mass fracture  .  Hypertension   . HLD (hyperlipidemia)     219/497  . Hypothyroidism   . Multinodular goiter (nontoxic)   . Osteoporosis   . Jaundice ~ 1935    "in grade school"  . Degenerative joint disease   . Depression   . Personal history of colonic polyps 02/29/2012    tubular adenoma  . Barrett's  esophagus   . CAD (coronary artery disease)     a. NSTEMI 03/2013: 2/2 diagonal disease (small, not amenable to PCI, for med rx).  . Moderate mitral regurgitation 2014  . Mild aortic stenosis 2014  . HOH (hard of hearing)   . Anemia, iron deficiency   . GERD (gastroesophageal reflux disease)   . Legally blind     "both eyes/Dr. Zigmund Daniel" (11/11/2014)  . Macular degeneration of both eyes     Past Surgical History  Procedure Laterality Date  . Bladder surgery  1990's    bladder tack early 90's  . Tear duct probing  07/29/03    tear duct surg  . Cystourethroscopy  09/17/03  . Rotator cuff repair Bilateral ? date; 09/07/05    left; right( Dr. Gladstone Lighter)  . Appendectomy  1941  . Thyroid ultrasound  10/14/2003    MNG, no dominant masses  . Doppler echocardiography  03/05/2002&09/11/2003    ECHO, EF wnl, mild stenosis, A.S. mild MR, Mild T.R03/31/2003//ECHO EF 70%,LVH, ?diast dysfunction 09/11/2003  . Cataract extraction, bilateral  2003  . Dilation and curettage of uterus  09/07/2000    endometrial polyps removed, path all benign   . Tonsillectomy and adenoidectomy      "as a child"  . Fracture surgery  2010    right knee  . Knee arthroscopy Right   . Reduction mammaplasty  1981  . Incontinence surgery      "she's had a tack and a sling"  . Left heart catheterization with coronary angiogram N/A 03/27/2013    Procedure: LEFT HEART CATHETERIZATION WITH CORONARY ANGIOGRAM;  Surgeon: Peter M Martinique, MD;  Location: [facility] C[facility];  Service: Cardiovascular;  Laterality: N/A;  . Femur im nail Left 11/28/2014    Procedure: INTRAMEDULLARY  NAIL Left FEMORAL;  Surgeon: Marybelle Killings, MD;  Location: [place];  Service: Orthopedics;  Laterality: Left;    History  Substance Use Topics  . Smoking status: Former Smoker -- 0.50 packs/day for 4 years    Types: Cigarettes    Quit date: 07/06/1974  . Smokeless tobacco: Never Used  . Alcohol Use: No    Family History  Problem Relation Age of Onset  .  Heart failure Mother     CHF, DM, HBP  . Hypertension Mother   . Uterine cancer Mother   . Stroke Mother   . Colon cancer Neg Hx   . Esophageal cancer Neg Hx   . Rectal cancer Neg Hx   . Stomach cancer Neg Hx       Medication List       This list is accurate as of: 03/28/15  4:08 PM.  Always use your most recent med list.               acetaminophen 325 MG tablet  Commonly known as:  TYLENOL  Take 650 mg by mouth 3 (three) times daily.     amiodarone 200 MG tablet  Commonly known as:  PACERONE  Take 1 tablet (200 mg total) by mouth 2 (two) times daily.     ARTIFICIAL TEARS OP  Place 1 drop  into both eyes 2 (two) times daily.     aspirin 81 MG chewable tablet  Chew 1 tablet (81 mg total) by mouth daily.     diltiazem 30 MG tablet  Commonly known as:  CARDIZEM  Take 1 tablet (30 mg total) by mouth every 6 (six) hours as needed (rapid heart rate.).     fluticasone 50 MCG/ACT nasal spray  Commonly known as:  FLONASE  Place 1 spray into both nostrils daily.     furosemide 40 MG tablet  Commonly known as:  LASIX  Take 40 mg by mouth daily.     levothyroxine 25 MCG tablet  Commonly known as:  SYNTHROID, LEVOTHROID  Take 1 tablet (25 mcg total) by mouth every morning.     losartan 25 MG tablet  Commonly known as:  COZAAR  Take 12.5 mg by mouth daily.     metoprolol tartrate 25 MG tablet  Commonly known as:  LOPRESSOR  Take 12.5 mg by mouth 2 (two) times daily.     mirtazapine 15 MG tablet  Commonly known as:  REMERON  Take 15 mg by mouth at bedtime.     morphine 20 MG/ML concentrated solution  Commonly known as:  ROXANOL  Take 0.25 ml = 5 mg by mouth every 4 hours as needed for pain/shortness of breath DOUBLE CHECK DOSE, USE CALIBRATED SYRINGE     omeprazole 40 MG capsule  Commonly known as:  PRILOSEC  Take 40 mg by mouth daily.     potassium chloride SA 20 MEQ tablet  Commonly known as:  K-DUR,KLOR-CON  Take 40 mEq by mouth daily.      prochlorperazine 5 MG tablet  Commonly known as:  COMPAZINE  Take 5 mg by mouth every 6 (six) hours as needed for nausea or vomiting.     zolpidem 5 MG tablet  Commonly known as:  AMBIEN  Take 5 mg by mouth at bedtime.        Physical Exam  BP 129/59 mmHg  Pulse 64  Temp(Src) 99 F (37.2 C)  Resp 16  Ht 5' (1.524 m)  Wt 119 lb 9.6 oz (54.25 kg)  BMI 23.36 kg/m2  SpO2 93%  Constitutional: Frail elderly female in no acute distress. Conversant and pleasant  HEENT: Normocephalic and atraumatic. PERRL. EOM intact. No icterus. Legally blind. Hard of hearing. No nasal discharge or sinus tenderness. Oral mucosa moist. Posterior pharynx clear of any exudate or lesions.  Neck: Supple and nontender. No lymphadenopathy, masses, or thyromegaly. No JVD or carotid bruits.  Cardiac: Normal S1, S2. RRR with 2/6 systolic mumur. Distal pulses intact. No dependent edema.  Lungs: No respiratory distress. Breath sounds diminished bilaterally with wheezes throughout. Oxygen via [place] in place  Abdomen: Audible bowel sounds in all quadrants. Soft, nontender, nondistended.  Musculoskeletal: able to move all extremities. Generalized weakness present.  Skin: Warm and dry.  Neurological: Alert and oriented to self.  Psychiatric: Appropriate mood and affect.    Labs Reviewed  CBC Latest Ref Rng 12/12/2014 12/05/2014 12/03/2014  WBC - 6.8 6.8 5.7  Hemoglobin 12.0 - 16.0 g/dL 10.2(A) 10.2(A) 10.6(A)  Hematocrit 36 - 46 % 33(A) 33(A) 36  Platelets 150 - 399 K/L 332 337 251    CMP Latest Ref Rng 01/02/2015 12/05/2014 12/03/2014  Glucose 70 - 99 mg/dL - - -  BUN 4 - 21 mg/dL 19 23(A) 14  Creatinine 0.5 - 1.1 mg/dL 0.9 0.9 0.8  Sodium 137 - 147 mmol/L 138 130(A) 131(A)  Potassium 3.4 - 5.3 mmol/L 4.0 3.8 4.0  Chloride 96 - 112 mEq/L - - -  CO2 19 - 32 mmol/L - - -  Calcium 8.4 - 10.5 mg/dL - - -  Total Protein 6.0 - 8.3 g/dL - - -  Total Bilirubin 0.3 - 1.2 mg/dL - - -  Alkaline Phos 39 - 117 U/L - -  -  AST 0 - 37 U/L - - -  ALT 0 - 35 U/L - - -    Lab Results  Component Value Date   TSH 1.200 11/13/2014   Assessment & Plan 1. Protein-calorie malnutrition Gradual weight loss (1 lb over the past 30 days). Continue current diet and dietary supplement.   2. Hypothyroidism, unspecified hypothyroidism type Stable. Continue to levothyroxine 74mcg daily  3. Depression Persists. Continue remeron 15mg  daily for now and continue to monitor for change in mood  4. Anemia of chronic disease Stable. Recent hgb 10.2.   5. Chronic diastolic CHF (congestive heart failure) Appears euvolemic on exam. Continue lopressor 12.5mg  twice daily, losartan 12.5mg  daily, and lasix 40mg  daily with potassium 54meq daily. Continue to monitor her status  6. Insomnia Stable. Continue ambien 5mg  nightly   7. Gastroesophageal reflux disease, esophagitis presence not specified Stable. Continue omeprazole 40mg  daily.  8. Essential hypertension Stable. Continue losartan 12.5mg  daily, lasix 40mg  daily, and lopressor 12.5mg  twice daily.   9. Generalized OA Stable.Continue tylenol 650mg  three times daily with roxanol 5mg  every four hours as needed for pain.  10. Intertrochanteric fracture of left hip, closed, sequela Pain is adequately controlled. Continue tylenol 650mg  three times daily with roxanol 5mg  every four hours as needed for pain and shortness of breath.   11. Paroxysmal atrial fibrillation Stable. Rate controlled. Continue amiodarone 200mg  twice daily, lopressor 12.5mg  twice daily, asa 81mg  daily, and cardizem 30mg  every six hours as needed for HR>120. Continue to monitor   12. Chronic respiratory failure with hypoxia Stable. Continue oxygen therapy at 2lpm via nasal cannula to maintain O2 sat above 88%.   13. Respiratory crackles with low grade fever Stat Xray for further evaluation. Duoneb Q6H x 3 days then Q6H PRN shortness of breath. Continue to monitor her status.    Family/Staff  Communication Plan of care discussed with patient, son, Ronalee Belts and Engineer, civil (consulting). Patient, son, and nursing staff verbalized understanding and agree with plan of care. No additional questions or concerns reported.    Arthur Holms, MSN, AGNP-C [facility] [address] [place], [place] [address] [phone] [8am-5pm] After hours: [phone]

## 2015-04-02 ENCOUNTER — Other Ambulatory Visit: Payer: Self-pay

## 2015-04-02 MED ORDER — ZOLPIDEM TARTRATE 5 MG PO TABS: 5.0000 mg | ORAL_TABLET | Freq: Every day | ORAL | Status: DC

## 2015-04-02 NOTE — Telephone Encounter (Signed)
Rx faxed to [facility] @ [phone], phone number [phone]  

## 2015-04-25 ENCOUNTER — Encounter: Payer: Self-pay | Admitting: Registered Nurse

## 2015-04-25 ENCOUNTER — Non-Acute Institutional Stay (SKILLED_NURSING_FACILITY): Payer: Medicare Other | Admitting: Registered Nurse

## 2015-04-25 DIAGNOSIS — J9611 Chronic respiratory failure with hypoxia: Secondary | ICD-10-CM

## 2015-04-25 DIAGNOSIS — I10 Essential (primary) hypertension: Secondary | ICD-10-CM

## 2015-04-25 DIAGNOSIS — E039 Hypothyroidism, unspecified: Secondary | ICD-10-CM

## 2015-04-25 DIAGNOSIS — I5032 Chronic diastolic (congestive) heart failure: Secondary | ICD-10-CM | POA: Diagnosis not present

## 2015-04-25 DIAGNOSIS — K219 Gastro-esophageal reflux disease without esophagitis: Secondary | ICD-10-CM

## 2015-04-25 DIAGNOSIS — S72142S Displaced intertrochanteric fracture of left femur, sequela: Secondary | ICD-10-CM | POA: Diagnosis not present

## 2015-04-25 DIAGNOSIS — M159 Polyosteoarthritis, unspecified: Secondary | ICD-10-CM

## 2015-04-25 DIAGNOSIS — I480 Paroxysmal atrial fibrillation: Secondary | ICD-10-CM

## 2015-04-25 DIAGNOSIS — F329 Major depressive disorder, single episode, unspecified: Secondary | ICD-10-CM | POA: Diagnosis not present

## 2015-04-25 DIAGNOSIS — G4700 Insomnia, unspecified: Secondary | ICD-10-CM | POA: Diagnosis not present

## 2015-04-25 DIAGNOSIS — F32A Depression, unspecified: Secondary | ICD-10-CM

## 2015-04-25 DIAGNOSIS — R0602 Shortness of breath: Secondary | ICD-10-CM | POA: Diagnosis not present

## 2015-04-25 DIAGNOSIS — E46 Unspecified protein-calorie malnutrition: Secondary | ICD-10-CM | POA: Diagnosis not present

## 2015-04-25 NOTE — Progress Notes (Signed)
Patient ID: Cindy [name], female   DOB: 1922/02/21, 81 y.o.   MRN: [id number]   Place of Service: [facility]  Allergies  Allergen Reactions  . Lipitor [Atorvastatin] Nausea Only and Other (See Comments)    LFT elevation  . Morphine And Related Other (See Comments)    "drives me crazy" and hyperactivity  . Irbesartan Swelling  . Ramipril Swelling    REACTION: lips swelling  . Telmisartan-Hctz Other (See Comments)    REACTION: incontinence  . Amiodarone Other (See Comments)    'terrible headaches'  . Hydrochlorothiazide Other (See Comments)    hyponatremia  . Metoprolol Other (See Comments)    Headache, dizzy, "terribly sick"  . Other Other (See Comments)    ANTICOAGULANTS - not a candidate due to history of falls and bleeding  . Tikosyn [Dofetilide] Other (See Comments)    Not a candidate due to Prolonged QT  . Norpace [Disopyramide] Other (See Comments)    Dry mouth    Code Status: DNR  Goals of Care: Comfort and Quality of Life/Hospice   Chief Complaint  Patient presents with  . Medical Management of Chronic Issues    CHF, HTN, GERD, OA, insomnia, chronic respiratory failure, PCM    HPI  81 y.o. female Hospice patient with multiple complex PMH including paroxysmal afib, hypertrophic cardiomyopathy, UI, hypothyroidism, CHF, respiratory failure on chronic oxygen at 2lmp via [place], depression, left hip fracture, macular degeneration-legally blind in both eyes among others is being seen for a routine visit for management of her chronic issues. Continues with gradual weight loss-1lb over the past 30 days. No recent fall or skin concerns reported. No change in behavior or functional status reported. Left hip pain is adequately controlled with current regimen. BP range 120-140s/50-70s. GERD stable with ppi. No recent CHF exacerbation. No issues with insomnia with Ambien. Seen in room today. Reports having some pain in her left leg and would like some pain med. States  she's feeling better today than she has been over the past few days. No other concerns reported.   Review of Systems Constitutional: Negative for fever, chills, and fatigue.  HENT: Negative for ear pain, congestion, and sore throat  Eyes: Negative for eye pain and eye discharge. Positive for vision impairment-legally blind  Cardiovascular: Negative for chest pain, palpitations, and leg swelling  Respiratory: Negative cough, wheezing. Positive for shortness of breath Gastrointestinal: Negative for nausea and vomiting. Negative for abdominal pain, diarrhea and constipation.  Genitourinary: Negative for dysuria and hematuria  Endocrine: Negative for polydipsia, polyphagia, and polyuria  Musculoskeletal: Negative for uncontrolled pain Neurological: Negative for dizziness and headache  Skin: Negative for rash and wound.  Psychiatric: Negative for depression.     Past Medical History  Diagnosis Date  . Atrial fibrillation 04/23-24/2007    a. recurrent PAF with RVR in September 2013. b. Evaluated 03/2013, previously intolerant to Norpace and Amiodarone - consider Multaq if recurs. c. Not on anticoag due to history of falls and also some internal bleeding per son.  . Hypertrophic cardiomyopathy     i am not sure SK 2015  . Urinary incontinence   . Diverticulosis of colon (without mention of hemorrhage) 2003/ 08/2000    EGD/colonoscopy Barretts esophagus//H.H divertics 08/2000  . Cervical mass     C2 lateral mass fracture  . Hypertension   . HLD (hyperlipidemia)     219/497  . Hypothyroidism   . Multinodular goiter (nontoxic)   . Osteoporosis   . Jaundice ~  1935    "in grade school"  . Degenerative joint disease   . Depression   . Personal history of colonic polyps 02/29/2012    tubular adenoma  . Barrett's esophagus   . CAD (coronary artery disease)     a. NSTEMI 03/2013: 2/2 diagonal disease (small, not amenable to PCI, for med rx).  . Moderate mitral regurgitation 2014  . Mild  aortic stenosis 2014  . HOH (hard of hearing)   . Anemia, iron deficiency   . GERD (gastroesophageal reflux disease)   . Legally blind     "both eyes/Dr. Zigmund Daniel" (11/11/2014)  . Macular degeneration of both eyes     Past Surgical History  Procedure Laterality Date  . Bladder surgery  1990's    bladder tack early 90's  . Tear duct probing  07/29/03    tear duct surg  . Cystourethroscopy  09/17/03  . Rotator cuff repair Bilateral ? date; 09/07/05    left; right( Dr. Gladstone Lighter)  . Appendectomy  1941  . Thyroid ultrasound  10/14/2003    MNG, no dominant masses  . Doppler echocardiography  03/05/2002&09/11/2003    ECHO, EF wnl, mild stenosis, A.S. mild MR, Mild T.R03/31/2003//ECHO EF 70%,LVH, ?diast dysfunction 09/11/2003  . Cataract extraction, bilateral  2003  . Dilation and curettage of uterus  09/07/2000    endometrial polyps removed, path all benign   . Tonsillectomy and adenoidectomy      "as a child"  . Fracture surgery  2010    right knee  . Knee arthroscopy Right   . Reduction mammaplasty  1981  . Incontinence surgery      "she's had a tack and a sling"  . Left heart catheterization with coronary angiogram N/A 03/27/2013    Procedure: LEFT HEART CATHETERIZATION WITH CORONARY ANGIOGRAM;  Surgeon: Peter M Martinique, MD;  Location: [facility] CATH LAB;  Service: Cardiovascular;  Laterality: N/A;  . Femur im nail Left 11/28/2014    Procedure: INTRAMEDULLARY  NAIL Left FEMORAL;  Surgeon: Marybelle Killings, MD;  Location: [place];  Service: Orthopedics;  Laterality: Left;    History  Substance Use Topics  . Smoking status: Former Smoker -- 0.50 packs/day for 4 years    Types: Cigarettes    Quit date: 07/06/1974  . Smokeless tobacco: Never Used  . Alcohol Use: No    Family History  Problem Relation Age of Onset  . Heart failure Mother     CHF, DM, HBP  . Hypertension Mother   . Uterine cancer Mother   . Stroke Mother   . Colon cancer Neg Hx   . Esophageal cancer Neg Hx   . Rectal  cancer Neg Hx   . Stomach cancer Neg Hx       Medication List       This list is accurate as of: 04/25/15  2:43 PM.  Always use your most recent med list.               acetaminophen 325 MG tablet  Commonly known as:  TYLENOL  Take 650 mg by mouth 3 (three) times daily.     amiodarone 200 MG tablet  Commonly known as:  PACERONE  Take 1 tablet (200 mg total) by mouth 2 (two) times daily.     ARTIFICIAL TEARS OP  Place 1 drop into both eyes 2 (two) times daily.     aspirin 81 MG chewable tablet  Chew 1 tablet (81 mg total) by mouth daily.  diltiazem 30 MG tablet  Commonly known as:  CARDIZEM  Take 1 tablet (30 mg total) by mouth every 6 (six) hours as needed (rapid heart rate.).     fluticasone 50 MCG/ACT nasal spray  Commonly known as:  FLONASE  Place 1 spray into both nostrils daily.     furosemide 40 MG tablet  Commonly known as:  LASIX  Take 40 mg by mouth daily.     ipratropium-albuterol 0.5-2.5 (3) MG/3ML Soln  Commonly known as:  DUONEB  Take 3 mLs by nebulization 2 (two) times daily. With Q6H PRN     levothyroxine 25 MCG tablet  Commonly known as:  SYNTHROID, LEVOTHROID  Take 1 tablet (25 mcg total) by mouth every morning.     losartan 25 MG tablet  Commonly known as:  COZAAR  Take 12.5 mg by mouth daily.     metoprolol tartrate 25 MG tablet  Commonly known as:  LOPRESSOR  Take 12.5 mg by mouth 2 (two) times daily.     mirtazapine 15 MG tablet  Commonly known as:  REMERON  Take 15 mg by mouth at bedtime.     morphine 20 MG/ML concentrated solution  Commonly known as:  ROXANOL  Take 0.25 ml = 5 mg by mouth every 4 hours as needed for pain/shortness of breath DOUBLE CHECK DOSE, USE CALIBRATED SYRINGE     omeprazole 40 MG capsule  Commonly known as:  PRILOSEC  Take 40 mg by mouth daily.     potassium chloride SA 20 MEQ tablet  Commonly known as:  K-DUR,KLOR-CON  Take 40 mEq by mouth daily.     prochlorperazine 5 MG tablet  Commonly known  as:  COMPAZINE  Take 5 mg by mouth every 6 (six) hours as needed for nausea or vomiting.     zolpidem 5 MG tablet  Commonly known as:  AMBIEN  Take 1 tablet (5 mg total) by mouth at bedtime.        Physical Exam  BP 130/78 mmHg  Pulse 74  Temp(Src) 97.8 F (36.6 C)  Resp 18  Ht 5' (1.524 m)  Wt 118 lb 9.6 oz (53.797 kg)  BMI 23.16 kg/m2  SpO2 98%  Constitutional: Frail elderly female in no acute distress. Conversant and pleasant  HEENT: Normocephalic and atraumatic. PERRL. EOM intact. No icterus. Legally blind. Hard of hearing. No nasal discharge or sinus tenderness. Oral mucosa moist. Posterior pharynx clear of any exudate or lesions.  Neck: Supple and nontender. No lymphadenopathy, masses, or thyromegaly. No JVD or carotid bruits.  Cardiac: Normal S1, S2. RRR with 2/6 systolic mumur. Distal pulses intact. No dependent edema.  Lungs: No respiratory distress. Breath sounds clear bilaterally with wheezes throughout. Oxygen at 2lmp via [place] in place  Abdomen: Audible bowel sounds in all quadrants. Soft, nontender, nondistended.  Musculoskeletal: able to move all extremities. Generalized weakness.  Skin: Warm and dry.  Neurological: Alert and oriented to self.  Psychiatric: Appropriate mood and affect.    Labs Reviewed  CBC Latest Ref Rng 12/12/2014 12/05/2014 12/03/2014  WBC - 6.8 6.8 5.7  Hemoglobin 12.0 - 16.0 g/dL 10.2(A) 10.2(A) 10.6(A)  Hematocrit 36 - 46 % 33(A) 33(A) 36  Platelets 150 - 399 K/L 332 337 251    CMP Latest Ref Rng 01/02/2015 12/05/2014 12/03/2014  Glucose 70 - 99 mg/dL - - -  BUN 4 - 21 mg/dL 19 23(A) 14  Creatinine 0.5 - 1.1 mg/dL 0.9 0.9 0.8  Sodium 137 - 147 mmol/L 138  130(A) 131(A)  Potassium 3.4 - 5.3 mmol/L 4.0 3.8 4.0  Chloride 96 - 112 mEq/L - - -  CO2 19 - 32 mmol/L - - -  Calcium 8.4 - 10.5 mg/dL - - -  Total Protein 6.0 - 8.3 g/dL - - -  Total Bilirubin 0.3 - 1.2 mg/dL - - -  Alkaline Phos 39 - 117 U/L - - -  AST 0 - 37 U/L - - -  ALT  0 - 35 U/L - - -    Lab Results  Component Value Date   TSH 1.200 11/13/2014   Assessment & Plan 1. Protein-calorie malnutrition Continue with gradual weight loss (1 lb over the past 30 days). With goal of care being comfort, will not make any change at this time. Continue current diet and dietary supplement. Continue to monitor her nutritional status.   2. Hypothyroidism, unspecified hypothyroidism type Stable. Continue to levothyroxine 80mcg daily  3. Depression Mood stable. Continue remeron 15mg  daily. Monitor for change in mood  4. Chronic diastolic CHF (congestive heart failure) Appears euvolemic on exam. EF 60-65% on 2D Echo in 03/15. Continue lopressor 12.5mg  twice daily, losartan 12.5mg  daily, and lasix 40mg  daily with kcl supplement. Continue duoneb and monitor her status  5. Insomnia No issues. Continue ambien 5mg  daily at bedtime.  6. Gastroesophageal reflux disease, esophagitis presence not specified Stable. Continue omeprazole 40mg  daily.  7. Essential hypertension Adequately controlled. Continue losartan 12.5mg  daily, lasix 40mg  daily, and lopressor 12.5mg  twice daily. Monitor BPs.   8. Generalized OA Stable. Continue tylenol 650mg  three times daily with roxanol 5mg  every four hours as needed for pain.  9. Intertrochanteric fracture of left hip, closed, sequela Stable. Continue tylenol 650mg  three times daily with roxanol 5mg  every four hours as needed for pain.   10. Paroxysmal atrial fibrillation Stable. Rate controlled. Continue amiodarone 200mg  twice daily, lopressor 12.5mg  twice daily, asa 81mg  daily, and cardizem 30mg  every six hours as needed for HR>120. Continue to monitor   11. Chronic respiratory failure with hypoxia Stable. Continue oxygen therapy at 2lpm via nasal cannula to maintain O2 sat above 88%. Continue duoneb   12. Shortness of breath Duoneb every 12 hours routinely with every 6 hours as needed for shob/wheezing.    Family/Staff  Communication Plan of care discussed with patient and nursing staff. Patient and nursing staff verbalized understanding and agree with plan of care. No additional questions or concerns reported.    Arthur Holms, MSN, AGNP-C [facility] [address] [place], [place] [address] [phone] [8am-5pm] After hours: [phone]

## 2015-05-15 ENCOUNTER — Other Ambulatory Visit: Payer: Self-pay

## 2015-05-15 MED ORDER — ZOLPIDEM TARTRATE ER 6.25 MG PO TBCR: 6.2500 mg | EXTENDED_RELEASE_TABLET | Freq: Every evening | ORAL | Status: DC | PRN

## 2015-05-15 NOTE — Telephone Encounter (Signed)
Rx faxed to [facility] @ [phone], phone number [phone]  

## 2015-05-29 LAB — CBC AND DIFFERENTIAL
HEMATOCRIT: 29 % — AB (ref 36–46)
Hemoglobin: 9.2 g/dL — AB (ref 12.0–16.0)
Platelets: 201 10*3/uL (ref 150–399)

## 2015-05-29 LAB — BASIC METABOLIC PANEL
BUN: 13 mg/dL (ref 4–21)
CREATININE: 0.7 mg/dL (ref 0.5–1.1)
Glucose: 87 mg/dL
POTASSIUM: 4.1 mmol/L (ref 3.4–5.3)
Sodium: 133 mmol/L — AB (ref 137–147)

## 2015-05-29 LAB — TSH: TSH: 15.76 u[IU]/mL — AB (ref 0.41–5.90)

## 2015-06-02 ENCOUNTER — Non-Acute Institutional Stay (SKILLED_NURSING_FACILITY): Payer: Medicare Other | Admitting: Nurse Practitioner

## 2015-06-02 DIAGNOSIS — D509 Iron deficiency anemia, unspecified: Secondary | ICD-10-CM | POA: Diagnosis not present

## 2015-06-02 DIAGNOSIS — I2510 Atherosclerotic heart disease of native coronary artery without angina pectoris: Secondary | ICD-10-CM

## 2015-06-02 DIAGNOSIS — I480 Paroxysmal atrial fibrillation: Secondary | ICD-10-CM

## 2015-06-02 DIAGNOSIS — I5032 Chronic diastolic (congestive) heart failure: Secondary | ICD-10-CM

## 2015-06-02 DIAGNOSIS — G4700 Insomnia, unspecified: Secondary | ICD-10-CM | POA: Diagnosis not present

## 2015-06-02 DIAGNOSIS — K219 Gastro-esophageal reflux disease without esophagitis: Secondary | ICD-10-CM | POA: Diagnosis not present

## 2015-06-02 DIAGNOSIS — E039 Hypothyroidism, unspecified: Secondary | ICD-10-CM | POA: Diagnosis not present

## 2015-06-02 DIAGNOSIS — I2583 Coronary atherosclerosis due to lipid rich plaque: Secondary | ICD-10-CM

## 2015-06-02 NOTE — Progress Notes (Signed)
Patient ID: Cindy [name], female   DOB: Aug 27, 1922, 81 y.o.   MRN: [id number]    Nursing Home Location:  [place] of Service: [phone])  PCP: Elsie Stain, MD  Allergies  Allergen Reactions  . Lipitor [Atorvastatin] Nausea Only and Other (See Comments)    LFT elevation  . Morphine And Related Other (See Comments)    "drives me crazy" and hyperactivity  . Irbesartan Swelling  . Ramipril Swelling    REACTION: lips swelling  . Telmisartan-Hctz Other (See Comments)    REACTION: incontinence  . Amiodarone Other (See Comments)    'terrible headaches'  . Hydrochlorothiazide Other (See Comments)    hyponatremia  . Metoprolol Other (See Comments)    Headache, dizzy, "terribly sick"  . Other Other (See Comments)    ANTICOAGULANTS - not a candidate due to history of falls and bleeding  . Tikosyn [Dofetilide] Other (See Comments)    Not a candidate due to Prolonged QT  . Norpace [Disopyramide] Other (See Comments)    Dry mouth    Chief Complaint  Patient presents with  . Medical Management of Chronic Issues    HPI:  Patient is a 81 y.o. female seen today at [facility] for routine follow up. Pt being followed by Hospice. Pt with multiple complex PMH including paroxysmal afib, hypertrophic cardiomyopathy, UI, hypothyroidism, CHF, respiratory failure on chronic oxygen at 2lmp via [place], depression, left hip fracture, macular degeneration-legally blind in both eyes among others. Son at the bedside. Pt reports she has been in her usual state of heatlh. No worsening shortness of breath.  Labs done and reviewed with pt and son   Review of Systems:  Review of Systems  Constitutional: Negative for activity change, appetite change, fatigue and unexpected weight change.  HENT: Negative for congestion and hearing loss.   Eyes: Positive for visual disturbance (legally blind).  Respiratory: Positive for shortness of breath. Negative for cough.        Chronic shortness of breath, on chronic O2  no worsening of symptoms  Cardiovascular: Negative for chest pain, palpitations and leg swelling.  Gastrointestinal: Negative for abdominal pain, diarrhea and constipation.  Genitourinary: Negative for dysuria and difficulty urinating.  Musculoskeletal: Negative for myalgias and arthralgias.  Skin: Negative for color change and wound.  Neurological: Negative for dizziness and weakness.  Psychiatric/Behavioral: Positive for sleep disturbance. Negative for behavioral problems, confusion and agitation.    Past Medical History  Diagnosis Date  . Atrial fibrillation 04/23-24/2007    a. recurrent PAF with RVR in September 2013. b. Evaluated 03/2013, previously intolerant to Norpace and Amiodarone - consider Multaq if recurs. c. Not on anticoag due to history of falls and also some internal bleeding per son.  . Hypertrophic cardiomyopathy     i am not sure SK 2015  . Urinary incontinence   . Diverticulosis of colon (without mention of hemorrhage) 2003/ 08/2000    EGD/colonoscopy Barretts esophagus//H.H divertics 08/2000  . Cervical mass     C2 lateral mass fracture  . Hypertension   . HLD (hyperlipidemia)     219/497  . Hypothyroidism   . Multinodular goiter (nontoxic)   . Osteoporosis   . Jaundice ~ 1935    "in grade school"  . Degenerative joint disease   . Depression   . Personal history of colonic polyps 02/29/2012    tubular adenoma  . Barrett's esophagus   . CAD (coronary artery disease)  a. NSTEMI 03/2013: 2/2 diagonal disease (small, not amenable to PCI, for med rx).  . Moderate mitral regurgitation 2014  . Mild aortic stenosis 2014  . HOH (hard of hearing)   . Anemia, iron deficiency   . GERD (gastroesophageal reflux disease)   . Legally blind     "both eyes/Dr. Zigmund Daniel" (11/11/2014)  . Macular degeneration of both eyes    Past Surgical History  Procedure Laterality Date  . Bladder surgery  1990's    bladder tack early  90's  . Tear duct probing  07/29/03    tear duct surg  . Cystourethroscopy  09/17/03  . Rotator cuff repair Bilateral ? date; 09/07/05    left; right( Dr. Gladstone Lighter)  . Appendectomy  1941  . Thyroid ultrasound  10/14/2003    MNG, no dominant masses  . Doppler echocardiography  03/05/2002&09/11/2003    ECHO, EF wnl, mild stenosis, A.S. mild MR, Mild T.R03/31/2003//ECHO EF 70%,LVH, ?diast dysfunction 09/11/2003  . Cataract extraction, bilateral  2003  . Dilation and curettage of uterus  09/07/2000    endometrial polyps removed, path all benign   . Tonsillectomy and adenoidectomy      "as a child"  . Fracture surgery  2010    right knee  . Knee arthroscopy Right   . Reduction mammaplasty  1981  . Incontinence surgery      "she's had a tack and a sling"  . Left heart catheterization with coronary angiogram N/A 03/27/2013    Procedure: LEFT HEART CATHETERIZATION WITH CORONARY ANGIOGRAM;  Surgeon: Peter M Martinique, MD;  Location: [facility] CATH LAB;  Service: Cardiovascular;  Laterality: N/A;  . Femur im nail Left 11/28/2014    Procedure: INTRAMEDULLARY  NAIL Left FEMORAL;  Surgeon: Marybelle Killings, MD;  Location: [place];  Service: Orthopedics;  Laterality: Left;   Social History:   reports that she quit smoking about 40 years ago. Her smoking use included Cigarettes. She has a 2 pack-year smoking history. She has never used smokeless tobacco. She reports that she does not drink alcohol or use illicit drugs.  Family History  Problem Relation Age of Onset  . Heart failure Mother     CHF, DM, HBP  . Hypertension Mother   . Uterine cancer Mother   . Stroke Mother   . Colon cancer Neg Hx   . Esophageal cancer Neg Hx   . Rectal cancer Neg Hx   . Stomach cancer Neg Hx     Medications: Patient's Medications  New Prescriptions   No medications on file  Previous Medications   ACETAMINOPHEN (TYLENOL) 325 MG TABLET    Take 650 mg by mouth 3 (three) times daily.    AMIODARONE (PACERONE) 200 MG TABLET     Take 1 tablet (200 mg total) by mouth 2 (two) times daily.   ASPIRIN 81 MG CHEWABLE TABLET    Chew 1 tablet (81 mg total) by mouth daily.   DILTIAZEM (CARDIZEM) 30 MG TABLET    Take 1 tablet (30 mg total) by mouth every 6 (six) hours as needed (rapid heart rate.).   FLUTICASONE (FLONASE) 50 MCG/ACT NASAL SPRAY    Place 1 spray into both nostrils daily.   FUROSEMIDE (LASIX) 40 MG TABLET    Take 40 mg by mouth daily.   HYPROMELLOSE (ARTIFICIAL TEARS OP)    Place 1 drop into both eyes 2 (two) times daily.   IPRATROPIUM-ALBUTEROL (DUONEB) 0.5-2.5 (3) MG/3ML SOLN    Take 3 mLs by nebulization 2 (two)  times daily. With Q6H PRN   LEVOTHYROXINE (SYNTHROID, LEVOTHROID) 25 MCG TABLET    Take 1 tablet (25 mcg total) by mouth every morning.   LOSARTAN (COZAAR) 25 MG TABLET    Take 12.5 mg by mouth daily.    METOPROLOL TARTRATE (LOPRESSOR) 25 MG TABLET    Take 12.5 mg by mouth 2 (two) times daily.   MIRTAZAPINE (REMERON) 15 MG TABLET    Take 15 mg by mouth at bedtime.   MORPHINE (ROXANOL) 20 MG/ML CONCENTRATED SOLUTION    Take 0.25 ml = 5 mg by mouth every 4 hours as needed for pain/shortness of breath DOUBLE CHECK DOSE, USE CALIBRATED SYRINGE   OMEPRAZOLE (PRILOSEC) 40 MG CAPSULE    Take 40 mg by mouth daily.   POTASSIUM CHLORIDE SA (K-DUR,KLOR-CON) 20 MEQ TABLET    Take 40 mEq by mouth daily.   PROCHLORPERAZINE (COMPAZINE) 5 MG TABLET    Take 5 mg by mouth every 6 (six) hours as needed for nausea or vomiting.   ZOLPIDEM (AMBIEN CR) 6.25 MG CR TABLET    Take 1 tablet (6.25 mg total) by mouth at bedtime as needed for sleep.   ZOLPIDEM (AMBIEN) 5 MG TABLET    Take 1 tablet (5 mg total) by mouth at bedtime.  Modified Medications   No medications on file  Discontinued Medications   No medications on file     Physical Exam: Filed Vitals:   06/02/15 1659  BP: 148/81  Pulse: 76  Temp: 97.1 F (36.2 C)  Resp: 20    Physical Exam  Constitutional: She is oriented to person, place, and time. No distress.   Frail female  HENT:  Head: Normocephalic and atraumatic.  Mouth/Throat: Oropharynx is clear and moist. No oropharyngeal exudate.  Eyes: Conjunctivae are normal. Pupils are equal, round, and reactive to light.  Neck: Normal range of motion. Neck supple.  Cardiovascular: Normal rate and regular rhythm.   Murmur heard. Pulmonary/Chest: Effort normal and breath sounds normal.  Chronic O2 @2L   Abdominal: Soft. Bowel sounds are normal.  Musculoskeletal: She exhibits no edema or tenderness.  Neurological: She is alert and oriented to person, place, and time.  Skin: Skin is warm and dry. She is not diaphoretic.  Psychiatric: She has a normal mood and affect.    Labs reviewed: Basic Metabolic Panel:  Recent Labs  08/19/14 1117  09/16/14 1957  11/29/14 0755 11/30/14 0518 12/01/14 0513  12/05/14 01/02/15 05/29/15  NA 126*  < >  --   < > 132* 129* 129*  < > 130* 138 133*  K 5.5*  < >  --   < > 4.8 3.8 3.4*  < > 3.8 4.0 4.1  CL 96  < >  --   < > 94* 94* 94*  --   --   --   --   CO2 25  < >  --   < > 29 25 26   --   --   --   --   GLUCOSE 135*  < >  --   < > 109* 88 112*  --   --   --   --   BUN 11  < >  --   < > 14 12 15   < > 23* 19 13  CREATININE 0.51  < >  --   < > 1.05 1.10 1.01  < > 0.9 0.9 0.7  CALCIUM 9.3  < >  --   < > 8.2* 8.1* 7.8*  --   --   --   --  MG 2.1  --  2.1  --   --   --   --   --   --   --   --   < > = values in this interval not displayed. Liver Function Tests:  Recent Labs  11/14/14 0656 11/16/14 0325 11/28/14 0919  AST 30 22 25   ALT 42* 25 19  ALKPHOS 68 66 58  BILITOT 0.3 0.3 0.6  PROT 5.7* 5.8* 5.5*  ALBUMIN 2.7* 2.7* 3.1*   No results for input(s): LIPASE, AMYLASE in the last 8760 hours. No results for input(s): AMMONIA in the last 8760 hours. CBC:  Recent Labs  11/11/14 1028  11/16/14 0325 11/28/14 0919 11/29/14 0755 11/30/14 0518 12/01/14 0513 12/03/14 12/05/14 12/12/14 05/29/15  WBC 6.0  < > 7.3 7.1 9.0 7.2 7.2 5.7 6.8 6.8  --     NEUTROABS 5.0  --  4.8 4.9  --   --   --   --   --   --   --   HGB 9.2*  < > 9.9* 9.0* 11.4* 9.8* 9.5* 10.6* 10.2* 10.2* 9.2*  HCT 32.2*  < > 32.9* 31.9* 36.7 32.1* 31.1* 36 33* 33* 29*  MCV 75.6*  < > 74.9* 76.5* 77.8* 78.1 79.9  --   --   --   --   PLT 280  < > 298 223 179 175 191 251 337 332 201  < > = values in this interval not displayed. TSH:  Recent Labs  09/16/14 1957 11/13/14 1250 05/29/15  TSH 1.010 1.200 15.76*   A1C: No results found for: HGBA1C Lipid Panel: No results for input(s): CHOL, HDL, LDLCALC, TRIG, CHOLHDL, LDLDIRECT in the last 8760 hours.   Assessment/Plan 1. Chronic diastolic CHF (congestive heart failure) Stable, euvolemic on exam. EF 60-65% on 2D Echo in 03/15. Continue lopressor 12.5mg  twice daily, losartan 12.5mg  daily, and lasix 40mg  daily with kcl supplement. Continue duoneb and o2  2. Coronary artery disease due to lipid rich plaque Stable, conts ASA  3. Hypothyroidism, unspecified hypothyroidism type TSH elevated, will increased synthroid 50 mcg daily  -TSH in 8 weeks   4. Insomnia conts to have issues with sleep at times, takes Ambien    5. Microcytic anemia hgb at 9.2, down from January, pt of hospice no signs of acute blood loss, will monitor   6. PAF (paroxysmal atrial fibrillation) Stable at this time, Rate controlled. Continue amiodarone 200mg  twice daily, lopressor 12.5mg  twice daily, asa 81mg  daily, and cardizem 30mg  every six hours as needed for HR>120.  7. Gastroesophageal reflux disease, esophagitis presence not specified Stable on omeprazole 40 mg daily   8. Generalized OA stable. Continue tylenol 650mg  three times daily with roxanol 5mg  every four hours as needed for pain.  Carlos American. Harle Battiest  [facility] [phone] 8 am - 5 pm) [phone] (after hours)

## 2015-06-03 DIAGNOSIS — K219 Gastro-esophageal reflux disease without esophagitis: Secondary | ICD-10-CM | POA: Insufficient documentation

## 2015-07-09 ENCOUNTER — Non-Acute Institutional Stay (SKILLED_NURSING_FACILITY): Payer: Medicare Other | Admitting: Internal Medicine

## 2015-07-09 DIAGNOSIS — K219 Gastro-esophageal reflux disease without esophagitis: Secondary | ICD-10-CM

## 2015-07-09 DIAGNOSIS — G4700 Insomnia, unspecified: Secondary | ICD-10-CM | POA: Diagnosis not present

## 2015-07-09 DIAGNOSIS — I480 Paroxysmal atrial fibrillation: Secondary | ICD-10-CM

## 2015-07-09 DIAGNOSIS — E039 Hypothyroidism, unspecified: Secondary | ICD-10-CM | POA: Diagnosis not present

## 2015-07-09 DIAGNOSIS — I2510 Atherosclerotic heart disease of native coronary artery without angina pectoris: Secondary | ICD-10-CM | POA: Diagnosis not present

## 2015-07-09 NOTE — Progress Notes (Signed)
Patient ID: Cindy [name], female   DOB: 22-Nov-1922, 81 y.o.   MRN: [id number]    Facility: [facility]    PCP: Blanchie Serve, MD  Code Status: dnr  Allergies  Allergen Reactions  . Lipitor [Atorvastatin] Nausea Only and Other (See Comments)    LFT elevation  . Morphine And Related Other (See Comments)    "drives me crazy" and hyperactivity  . Irbesartan Swelling  . Ramipril Swelling    REACTION: lips swelling  . Telmisartan-Hctz Other (See Comments)    REACTION: incontinence  . Amiodarone Other (See Comments)    'terrible headaches'  . Hydrochlorothiazide Other (See Comments)    hyponatremia  . Metoprolol Other (See Comments)    Headache, dizzy, "terribly sick"  . Other Other (See Comments)    ANTICOAGULANTS - not a candidate due to history of falls and bleeding  . Tikosyn [Dofetilide] Other (See Comments)    Not a candidate due to Prolonged QT  . Norpace [Disopyramide] Other (See Comments)    Dry mouth    Chief Complaint  Patient presents with  . Medical Management of Chronic Issues     HPI:  81 y/o female patient seen for routine visit. She is under hospice services, has been at her baseline. Denies any concerns this visit. Appears comfortable. No new concern from staff. No falls reported. No skin concern. No acute behavior concern.  She has PMH of paroxysmal afib, hypertrophic cardiomyopathy,hypothyroidism, CHF, depression, macular degeneration-legally blind in both eyes and urinary incontinence.   Review of Systems:  Constitutional: Negative for fever, chills, diaphoresis.  HENT: Negative for congestion Respiratory: positive for cough, shortness of breath , on o2 Cardiovascular: Negative for chest pain, palpitations Gastrointestinal: Negative for heartburn, nausea, vomiting, abdominal pain Genitourinary: Negative for dysuria Psychiatric/Behavioral: Negative for depression  Past Medical History  Diagnosis Date  . Atrial  fibrillation 04/23-24/2007    a. recurrent PAF with RVR in September 2013. b. Evaluated 03/2013, previously intolerant to Norpace and Amiodarone - consider Multaq if recurs. c. Not on anticoag due to history of falls and also some internal bleeding per son.  . Hypertrophic cardiomyopathy     i am not sure SK 2015  . Urinary incontinence   . Diverticulosis of colon (without mention of hemorrhage) 2003/ 08/2000    EGD/colonoscopy Barretts esophagus//H.H divertics 08/2000  . Cervical mass     C2 lateral mass fracture  . Hypertension   . HLD (hyperlipidemia)     219/497  . Hypothyroidism   . Multinodular goiter (nontoxic)   . Osteoporosis   . Jaundice ~ 1935    "in grade school"  . Degenerative joint disease   . Depression   . Personal history of colonic polyps 02/29/2012    tubular adenoma  . Barrett's esophagus   . CAD (coronary artery disease)     a. NSTEMI 03/2013: 2/2 diagonal disease (small, not amenable to PCI, for med rx).  . Moderate mitral regurgitation 2014  . Mild aortic stenosis 2014  . HOH (hard of hearing)   . Anemia, iron deficiency   . GERD (gastroesophageal reflux disease)   . Legally blind     "both eyes/Dr. Zigmund Daniel" (11/11/2014)  . Macular degeneration of both eyes    Past Surgical History  Procedure Laterality Date  . Bladder surgery  1990's    bladder tack early 90's  . Tear duct probing  07/29/03    tear duct surg  . Cystourethroscopy  09/17/03  .  Rotator cuff repair Bilateral ? date; 09/07/05    left; right( Dr. Gladstone Lighter)  . Appendectomy  1941  . Thyroid ultrasound  10/14/2003    MNG, no dominant masses  . Doppler echocardiography  03/05/2002&09/11/2003    ECHO, EF wnl, mild stenosis, A.S. mild MR, Mild T.R03/31/2003//ECHO EF 70%,LVH, ?diast dysfunction 09/11/2003  . Cataract extraction, bilateral  2003  . Dilation and curettage of uterus  09/07/2000    endometrial polyps removed, path all benign   . Tonsillectomy and adenoidectomy      "as a child"    . Fracture surgery  2010    right knee  . Knee arthroscopy Right   . Reduction mammaplasty  1981  . Incontinence surgery      "she's had a tack and a sling"  . Left heart catheterization with coronary angiogram N/A 03/27/2013    Procedure: LEFT HEART CATHETERIZATION WITH CORONARY ANGIOGRAM;  Surgeon: Peter M Martinique, MD;  Location[facility] C[facility] LAB;  Service: Cardiovascular;  Laterality: N/A;  . Femur im nail Left 11/28/2014    Procedure: INTRAMEDULLARY  NAIL Left FEMORAL;  Surgeon: Marybelle Killings, MD;  Location: [place];  Service: Orthopedics;  Laterality: Left;    Medications: Patient's Medications  New Prescriptions   No medications on file  Previous Medications   ACETAMINOPHEN (TYLENOL) 325 MG TABLET    Take 650 mg by mouth 3 (three) times daily.    AMIODARONE (PACERONE) 200 MG TABLET    Take 1 tablet (200 mg total) by mouth 2 (two) times daily.   ASPIRIN 81 MG CHEWABLE TABLET    Chew 1 tablet (81 mg total) by mouth daily.   DILTIAZEM (CARDIZEM) 30 MG TABLET    Take 1 tablet (30 mg total) by mouth every 6 (six) hours as needed (rapid heart rate.).   FLUTICASONE (FLONASE) 50 MCG/ACT NASAL SPRAY    Place 1 spray into both nostrils daily.   FUROSEMIDE (LASIX) 40 MG TABLET    Take 40 mg by mouth daily.   HYPROMELLOSE (ARTIFICIAL TEARS OP)    Place 1 drop into both eyes 2 (two) times daily.   IPRATROPIUM-ALBUTEROL (DUONEB) 0.5-2.5 (3) MG/3ML SOLN    Take 3 mLs by nebulization 2 (two) times daily. With Q6H PRN   LEVOTHYROXINE (SYNTHROID, LEVOTHROID) 25 MCG TABLET    Take 1 tablet (25 mcg total) by mouth every morning.   LOSARTAN (COZAAR) 25 MG TABLET    Take 12.5 mg by mouth daily.    METOPROLOL TARTRATE (LOPRESSOR) 25 MG TABLET    Take 12.5 mg by mouth 2 (two) times daily.   MIRTAZAPINE (REMERON) 15 MG TABLET    Take 15 mg by mouth at bedtime.   MORPHINE (ROXANOL) 20 MG/ML CONCENTRATED SOLUTION    Take 0.25 ml = 5 mg by mouth every 4 hours as needed for pain/shortness of breath DOUBLE CHECK DOSE,  USE CALIBRATED SYRINGE   OMEPRAZOLE (PRILOSEC) 40 MG CAPSULE    Take 40 mg by mouth daily.   POTASSIUM CHLORIDE SA (K-DUR,KLOR-CON) 20 MEQ TABLET    Take 40 mEq by mouth daily.   PROCHLORPERAZINE (COMPAZINE) 5 MG TABLET    Take 5 mg by mouth every 6 (six) hours as needed for nausea or vomiting.   ZOLPIDEM (AMBIEN) 5 MG TABLET    Take 1 tablet (5 mg total) by mouth at bedtime.  Modified Medications   No medications on file  Discontinued Medications   No medications on file     Physical Exam:  Filed Vitals:   07/09/15 1805  BP: 130/85  Pulse: 76  Temp: 97 F (36.1 C)  Resp: 18  Weight: 118 lb 12.8 oz (53.887 kg)  SpO2: 94%   Wt Readings from Last 3 Encounters:  07/09/15 118 lb 12.8 oz (53.887 kg)  04/25/15 118 lb 9.6 oz (53.797 kg)  03/28/15 119 lb 9.6 oz (54.25 kg)   General- elderly female in no acute distress, frail Head- atraumatic, normocephalic Eyes-no pallor, no icterus, no discharge Neck- no cervical lymphadenopathy Throat- moist mucus membrane Nose- normal nasal mucosa Cardiovascular- irregular HR, systolic murmur present Respiratory- bilateral poor air entry, no wheeze or crackles, no rhonchi Abdomen- bowel sounds present, soft, non tender Musculoskeletal- able to move all 4 extremities, generalized weakness, on a recliner Neurological- no focal deficit Skin- warm and dry   Labs reviewed: Basic Metabolic Panel:  Recent Labs  08/19/14 1117  09/16/14 1957  11/29/14 0755 11/30/14 0518 12/01/14 0513  12/05/14 01/02/15 05/29/15  NA 126*  < >  --   < > 132* 129* 129*  < > 130* 138 133*  K 5.5*  < >  --   < > 4.8 3.8 3.4*  < > 3.8 4.0 4.1  CL 96  < >  --   < > 94* 94* 94*  --   --   --   --   CO2 25  < >  --   < > 29 25 26   --   --   --   --   GLUCOSE 135*  < >  --   < > 109* 88 112*  --   --   --   --   BUN 11  < >  --   < > 14 12 15   < > 23* 19 13  CREATININE 0.51  < >  --   < > 1.05 1.10 1.01  < > 0.9 0.9 0.7  CALCIUM 9.3  < >  --   < > 8.2* 8.1* 7.8*  --    --   --   --   MG 2.1  --  2.1  --   --   --   --   --   --   --   --   < > = values in this interval not displayed. Liver Function Tests:  Recent Labs  11/14/14 0656 11/16/14 0325 11/28/14 0919  AST 30 22 25   ALT 42* 25 19  ALKPHOS 68 66 58  BILITOT 0.3 0.3 0.6  PROT 5.7* 5.8* 5.5*  ALBUMIN 2.7* 2.7* 3.1*   No results for input(s): LIPASE, AMYLASE in the last 8760 hours. No results for input(s): AMMONIA in the last 8760 hours. CBC:  Recent Labs  11/11/14 1028  11/16/14 0325 11/28/14 0919 11/29/14 0755 11/30/14 0518 12/01/14 0513 12/03/14 12/05/14 12/12/14 05/29/15  WBC 6.0  < > 7.3 7.1 9.0 7.2 7.2 5.7 6.8 6.8  --   NEUTROABS 5.0  --  4.8 4.9  --   --   --   --   --   --   --   HGB 9.2*  < > 9.9* 9.0* 11.4* 9.8* 9.5* 10.6* 10.2* 10.2* 9.2*  HCT 32.2*  < > 32.9* 31.9* 36.7 32.1* 31.1* 36 33* 33* 29*  MCV 75.6*  < > 74.9* 76.5* 77.8* 78.1 79.9  --   --   --   --   PLT 280  < > 298 223 179 175 191 251 337  332 201  < > = values in this interval not displayed. Cardiac Enzymes:  Recent Labs  11/11/14 2146 11/12/14 0332 11/28/14 0902  TROPONINI <0.30 <0.30 0.06*   Imaging 11/28/14: Left hip xray: Left femoral neck fracture.   11/28/14: Left hip xray (post op): IM nail with 2 screws placed across previously identified left femoral neck fracture. (Post ORIF left femoral fracture)   Assessment/Plan  Atrial fibrillation Continue amiodarone, lopressor and asa 81mg  daily with prn cardizem  Hypothyroidism Continue synthroid 50 mcg daily   Insomnia Continue her ambien for now and monitor  GERD Continue omeprazole 40mg  daily   Blanchie Serve, MD  [facility] [phone] (Monday-Friday 8 am - 5 pm) [phone] (afterhours)

## 2015-07-28 LAB — TSH: TSH: 8.82 u[IU]/mL — AB (ref 0.41–5.90)

## 2015-08-12 ENCOUNTER — Other Ambulatory Visit: Payer: Self-pay | Admitting: *Deleted

## 2015-08-12 MED ORDER — ZOLPIDEM TARTRATE ER 6.25 MG PO TBCR
EXTENDED_RELEASE_TABLET | ORAL | Status: DC
Start: 2015-08-12 — End: 2015-09-17

## 2015-08-12 NOTE — Telephone Encounter (Signed)
[facility] 

## 2015-08-27 ENCOUNTER — Non-Acute Institutional Stay (SKILLED_NURSING_FACILITY): Payer: Medicare Other | Admitting: Nurse Practitioner

## 2015-08-27 DIAGNOSIS — K219 Gastro-esophageal reflux disease without esophagitis: Secondary | ICD-10-CM | POA: Diagnosis not present

## 2015-08-27 DIAGNOSIS — I509 Heart failure, unspecified: Secondary | ICD-10-CM | POA: Diagnosis not present

## 2015-08-27 DIAGNOSIS — G4700 Insomnia, unspecified: Secondary | ICD-10-CM

## 2015-08-27 DIAGNOSIS — E039 Hypothyroidism, unspecified: Secondary | ICD-10-CM | POA: Diagnosis not present

## 2015-08-27 DIAGNOSIS — I10 Essential (primary) hypertension: Secondary | ICD-10-CM

## 2015-08-27 DIAGNOSIS — I480 Paroxysmal atrial fibrillation: Secondary | ICD-10-CM | POA: Diagnosis not present

## 2015-08-27 NOTE — Progress Notes (Signed)
Patient ID: Cindy [name], female   DOB: 25-Jul-1922, 81 y.o.   MRN: [id number]    Nursing Home Location:  [place] [facility] Service: SNF (31)  PCP: Blanchie Serve, MD  Allergies  Allergen Reactions  . Lipitor [Atorvastatin] Nausea Only and Other (See Comments)    LFT elevation  . Morphine And Related Other (See Comments)    "drives me crazy" and hyperactivity  . Irbesartan Swelling  . Ramipril Swelling    REACTION: lips swelling  . Telmisartan-Hctz Other (See Comments)    REACTION: incontinence  . Amiodarone Other (See Comments)    'terrible headaches'  . Hydrochlorothiazide Other (See Comments)    hyponatremia  . Metoprolol Other (See Comments)    Headache, dizzy, "terribly sick"  . Other Other (See Comments)    ANTICOAGULANTS - not a candidate due to history of falls and bleeding  . Tikosyn [Dofetilide] Other (See Comments)    Not a candidate due to Prolonged QT  . Norpace [Disopyramide] Other (See Comments)    Dry mouth    Chief Complaint  Patient presents with  . Medical Management of Chronic Issues    HPI:  Patient is a 81 y.o. female seen today at [facility] for medical management of her chronic health issues.  She has a past medical history of PAF, CHF, depression, and GERD.  She is seen today in her room, she is sitting up in a recliner and is alert.  She is also followed by Hospice.  No complaints of pain or discomfort today.  No acute concerns from nursing.  Patient is somewhat concerned about how often she is awakened at night by staff to ensure that her bed is dry.  Pt reports she is eating well but has lost weight in the last month. Magic cup was increased.   Review of Systems:  Review of Systems  Constitutional: Negative for fever and chills.  HENT: Negative for congestion, sinus pressure and sore throat.   Respiratory: Negative for cough, shortness of breath and wheezing.   Cardiovascular: Negative for chest  pain, palpitations and leg swelling.  Gastrointestinal: Negative for abdominal pain, diarrhea and constipation.  Genitourinary: Negative for dysuria, frequency and flank pain.  Musculoskeletal: Negative for myalgias, joint swelling and arthralgias.  Skin: Negative.   Neurological: Negative for dizziness, light-headedness and headaches.  Psychiatric/Behavioral: Negative for agitation. The patient is not nervous/anxious.     Past Medical History  Diagnosis Date  . Atrial fibrillation 04/23-24/2007    a. recurrent PAF with RVR in September 2013. b. Evaluated 03/2013, previously intolerant to Norpace and Amiodarone - consider Multaq if recurs. c. Not on anticoag due to history of falls and also some internal bleeding per son.  . Hypertrophic cardiomyopathy     i am not sure SK 2015  . Urinary incontinence   . Diverticulosis of colon (without mention of hemorrhage) 2003/ 08/2000    EGD/colonoscopy Barretts esophagus//H.H divertics 08/2000  . Cervical mass     C2 lateral mass fracture  . Hypertension   . HLD (hyperlipidemia)     219/497  . Hypothyroidism   . Multinodular goiter (nontoxic)   . Osteoporosis   . Jaundice ~ 1935    "in grade school"  . Degenerative joint disease   . Depression   . Personal history of colonic polyps 02/29/2012    tubular adenoma  . Barrett's esophagus   . CAD (coronary artery disease)  a. NSTEMI 03/2013: 2/2 diagonal disease (small, not amenable to PCI, for med rx).  . Moderate mitral regurgitation 2014  . Mild aortic stenosis 2014  . HOH (hard of hearing)   . Anemia, iron deficiency   . GERD (gastroesophageal reflux disease)   . Legally blind     "both eyes/Dr. Zigmund Daniel" (11/11/2014)  . Macular degeneration of both eyes    Past Surgical History  Procedure Laterality Date  . Bladder surgery  1990's    bladder tack early 90's  . Tear duct probing  07/29/03    tear duct surg  . Cystourethroscopy  09/17/03  . Rotator cuff repair Bilateral ? date;  09/07/05    left; right( Dr. Gladstone Lighter)  . Appendectomy  1941  . Thyroid ultrasound  10/14/2003    MNG, no dominant masses  . Doppler echocardiography  03/05/2002&09/11/2003    ECHO, EF wnl, mild stenosis, A.S. mild MR, Mild T.R03/31/2003//ECHO EF 70%,LVH, ?diast dysfunction 09/11/2003  . Cataract extraction, bilateral  2003  . Dilation and curettage of uterus  09/07/2000    endometrial polyps removed, path all benign   . Tonsillectomy and adenoidectomy      "as a child"  . Fracture surgery  2010    right knee  . Knee arthroscopy Right   . Reduction mammaplasty  1981  . Incontinence surgery      "she's had a tack and a sling"  . Left heart catheterization with coronary angiogram N/A 03/27/2013    Procedure: LEFT HEART CATHETERIZATION WITH CORONARY ANGIOGRAM;  Surgeon: Peter M Martinique, MD;  Location: [facility] C[facility];  Service: Cardiovascular;  Laterality: N/A;  . Femur im nail Left 11/28/2014    Procedure: INTRAMEDULLARY  NAIL Left FEMORAL;  Surgeon: Marybelle Killings, MD;  Location: [place];  Service: Orthopedics;  Laterality: Left;   Social History:   reports that she quit smoking about 41 years ago. Her smoking use included Cigarettes. She has a 2 pack-year smoking history. She has never used smokeless tobacco. She reports that she does not drink alcohol or use illicit drugs.  Family History  Problem Relation Age of Onset  . Heart failure Mother     CHF, DM, HBP  . Hypertension Mother   . Uterine cancer Mother   . Stroke Mother   . Colon cancer Neg Hx   . Esophageal cancer Neg Hx   . Rectal cancer Neg Hx   . Stomach cancer Neg Hx     Medications: Patient's Medications  New Prescriptions   No medications on file  Previous Medications   ACETAMINOPHEN (TYLENOL) 325 MG TABLET    Take 650 mg by mouth 3 (three) times daily.    AMIODARONE (PACERONE) 200 MG TABLET    Take 1 tablet (200 mg total) by mouth 2 (two) times daily.   ASPIRIN 81 MG CHEWABLE TABLET    Chew 1 tablet (81 mg total) by  mouth daily.   DILTIAZEM (CARDIZEM) 30 MG TABLET    Take 1 tablet (30 mg total) by mouth every 6 (six) hours as needed (rapid heart rate.).   FLUTICASONE (FLONASE) 50 MCG/ACT NASAL SPRAY    Place 1 spray into both nostrils daily.   FUROSEMIDE (LASIX) 40 MG TABLET    Take 40 mg by mouth daily.   HYPROMELLOSE (ARTIFICIAL TEARS OP)    Place 1 drop into both eyes 2 (two) times daily.   IPRATROPIUM-ALBUTEROL (DUONEB) 0.5-2.5 (3) MG/3ML SOLN    Take 3 mLs by nebulization 2 (two)  times daily. With Q6H PRN   LEVOTHYROXINE (SYNTHROID, LEVOTHROID) 25 MCG TABLET    Take 1 tablet (25 mcg total) by mouth every morning.   MIRTAZAPINE (REMERON) 15 MG TABLET    Take 15 mg by mouth at bedtime.   MORPHINE (ROXANOL) 20 MG/ML CONCENTRATED SOLUTION    Take 0.25 ml = 5 mg by mouth every 4 hours as needed for pain/shortness of breath DOUBLE CHECK DOSE, USE CALIBRATED SYRINGE   OMEPRAZOLE (PRILOSEC) 40 MG CAPSULE    Take 40 mg by mouth daily.   POTASSIUM CHLORIDE SA (K-DUR,KLOR-CON) 20 MEQ TABLET    Take 40 mEq by mouth daily.   PROCHLORPERAZINE (COMPAZINE) 5 MG TABLET    Take 5 mg by mouth every 6 (six) hours as needed for nausea or vomiting.   ZOLPIDEM (AMBIEN CR) 6.25 MG CR TABLET    Take one tablet by mouth every night at bedtime for sleep  Modified Medications   No medications on file  Discontinued Medications   LOSARTAN (COZAAR) 25 MG TABLET    Take 12.5 mg by mouth daily.    METOPROLOL TARTRATE (LOPRESSOR) 25 MG TABLET    Take 12.5 mg by mouth 2 (two) times daily.     Physical Exam: Filed Vitals:   08/27/15 0942  BP: 123/57  Pulse: 70  Temp: 97.3 F (36.3 C)  TempSrc: Oral  Resp: 18  Weight: 112 lb 12.8 oz (51.166 kg)    Physical Exam  Constitutional: No distress.  Frail elderly female  HENT:  Head: Normocephalic and atraumatic.  Eyes: Pupils are equal, round, and reactive to light.  Neck: Normal range of motion. Neck supple. No JVD present.  Cardiovascular: Normal rate, regular rhythm and  normal heart sounds.   Pulmonary/Chest: Effort normal and breath sounds normal.  Abdominal: Soft. Bowel sounds are normal. She exhibits no distension.  Musculoskeletal: Normal range of motion.  Lymphadenopathy:    She has no cervical adenopathy.  Neurological: She is alert.  Oriented to self and situation  Skin: Skin is warm and dry. She is not diaphoretic.  Psychiatric: She has a normal mood and affect.    Labs reviewed: Basic Metabolic Panel:  Recent Labs  09/16/14 1957  11/29/14 0755 11/30/14 0518 12/01/14 0513  12/05/14 01/02/15 05/29/15  NA  --   < > 132* 129* 129*  < > 130* 138 133*  K  --   < > 4.8 3.8 3.4*  < > 3.8 4.0 4.1  CL  --   < > 94* 94* 94*  --   --   --   --   CO2  --   < > 29 25 26   --   --   --   --   GLUCOSE  --   < > 109* 88 112*  --   --   --   --   BUN  --   < > 14 12 15   < > 23* 19 13  CREATININE  --   < > 1.05 1.10 1.01  < > 0.9 0.9 0.7  CALCIUM  --   < > 8.2* 8.1* 7.8*  --   --   --   --   MG 2.1  --   --   --   --   --   --   --   --   < > = values in this interval not displayed. Liver Function Tests:  Recent Labs  11/14/14 0656 11/16/14 0325 11/28/14 0919  AST 30 22 25   ALT 42* 25 19  ALKPHOS 68 66 58  BILITOT 0.3 0.3 0.6  PROT 5.7* 5.8* 5.5*  ALBUMIN 2.7* 2.7* 3.1*   No results for input(s): LIPASE, AMYLASE in the last 8760 hours. No results for input(s): AMMONIA in the last 8760 hours. CBC:  Recent Labs  11/11/14 1028  11/16/14 0325 11/28/14 0919 11/29/14 0755 11/30/14 0518 12/01/14 0513 12/03/14 12/05/14 12/12/14 05/29/15  WBC 6.0  < > 7.3 7.1 9.0 7.2 7.2 5.7 6.8 6.8  --   NEUTROABS 5.0  --  4.8 4.9  --   --   --   --   --   --   --   HGB 9.2*  < > 9.9* 9.0* 11.4* 9.8* 9.5* 10.6* 10.2* 10.2* 9.2*  HCT 32.2*  < > 32.9* 31.9* 36.7 32.1* 31.1* 36 33* 33* 29*  MCV 75.6*  < > 74.9* 76.5* 77.8* 78.1 79.9  --   --   --   --   PLT 280  < > 298 223 179 175 191 251 337 332 201  < > = values in this interval not  displayed. TSH:  Recent Labs  11/13/14 1250 05/29/15 07/28/15 1207  TSH 1.200 15.76* 8.82*   A1C: No results found for: HGBA1C Lipid Panel: No results for input(s): CHOL, HDL, LDLCALC, TRIG, CHOLHDL, LDLDIRECT in the last 8760 hours.    Assessment/Plan 1. Chronic diastolic CHF (congestive heart failure) Stable.  Weight stable.  No shortness of breath or cough noted on exam.  Continue current dose of furosemide.    2. Essential hypertension Stable.  Blood pressures within desirable range.  Renal function within normal limits in June.    3. PAF (paroxysmal atrial fibrillation) Stable.  Rate controlled.  Continue Amiodarone daily and cardizem prn.    4. Gastroesophageal reflux disease, esophagitis presence not specified Stable.  No complaints today.  Continue omeprazole daily.    5. Insomnia Stable.  Patient says sometimes she has "crazy nights".  She says these are the nights that she is awakened by staff. I reassured her that the staff is needing to check her bed to ensure it is dry.  She currently takes Ambien CR and melatonin.  She says that she feels rested this morning. Encouraged daytime activity and keeping the lights on during the day.    6. Hypothyroidism Recently increased due to elevated TSH.  TSH schedule to be drawn first week of October.  Currently on Synthroid 46mcg.     Carlos American. Harle Battiest  [facility] [phone] 8 am - 5 pm) [phone] (after hours)

## 2015-09-10 LAB — TSH: TSH: 11.92 u[IU]/mL — AB (ref 0.41–5.90)

## 2015-09-12 ENCOUNTER — Non-Acute Institutional Stay (SKILLED_NURSING_FACILITY): Payer: Medicare Other | Admitting: Internal Medicine

## 2015-09-12 DIAGNOSIS — I482 Chronic atrial fibrillation, unspecified: Secondary | ICD-10-CM

## 2015-09-12 DIAGNOSIS — E039 Hypothyroidism, unspecified: Secondary | ICD-10-CM

## 2015-09-12 NOTE — Progress Notes (Signed)
Patient ID: Cindy [name], female   DOB: [birth year], 81 y.o.   MRN: [id number]    Facility: [facility]   Chief Complaint  Patient presents with  . Acute Visit    abnormal lab   Allergies  Allergen Reactions  . Lipitor [Atorvastatin] Nausea Only and Other (See Comments)    LFT elevation  . Morphine And Related Other (See Comments)    "drives me crazy" and hyperactivity  . Irbesartan Swelling  . Ramipril Swelling    REACTION: lips swelling  . Telmisartan-Hctz Other (See Comments)    REACTION: incontinence  . Amiodarone Other (See Comments)    'terrible headaches'  . Hydrochlorothiazide Other (See Comments)    hyponatremia  . Metoprolol Other (See Comments)    Headache, dizzy, "terribly sick"  . Other Other (See Comments)    ANTICOAGULANTS - not a candidate due to history of falls and bleeding  . Tikosyn [Dofetilide] Other (See Comments)    Not a candidate due to Prolonged QT  . Norpace [Disopyramide] Other (See Comments)    Dry mouth    Code Status: dnr   HPI:  81 y/o female patient seen for acute visit. She is under hospice services, has been at her baseline. She had routine lab work with thyroid panel being checked. Her tsh has resulted 11.921. She is currently on levothyroxine 50 mcg daily. Appears comfortable. No new concern from staff. No falls reported. No skin concern. No acute behavior concern.    Review of Systems:  Constitutional: Negative for fever  HENT: Negative for congestion Respiratory: positive for cough, shortness of breath , on o2 Cardiovascular: Negative for chest pain, palpitations Gastrointestinal: Negative for heartburn, nausea, vomiting, abdominal pain Genitourinary: Negative for dysuria Psychiatric/Behavioral: Negative for depression  Past Medical History  Diagnosis Date  . Atrial fibrillation 04/23-24/2007    a. recurrent PAF with RVR in September 2013. b. Evaluated 03/2013, previously intolerant to Norpace and  Amiodarone - consider Multaq if recurs. c. Not on anticoag due to history of falls and also some internal bleeding per son.  . Hypertrophic cardiomyopathy     i am not sure SK 2015  . Urinary incontinence   . Diverticulosis of colon (without mention of hemorrhage) 2003/ 08/2000    EGD/colonoscopy Barretts esophagus//H.H divertics 08/2000  . Cervical mass     C2 lateral mass fracture  . Hypertension   . HLD (hyperlipidemia)     219/497  . Hypothyroidism   . Multinodular goiter (nontoxic)   . Osteoporosis   . Jaundice ~ 1935    "in grade school"  . Degenerative joint disease   . Depression   . Personal history of colonic polyps 02/29/2012    tubular adenoma  . Barrett's esophagus   . CAD (coronary artery disease)     a. NSTEMI 03/2013: 2/2 diagonal disease (small, not amenable to PCI, for med rx).  . Moderate mitral regurgitation 2014  . Mild aortic stenosis 2014  . HOH (hard of hearing)   . Anemia, iron deficiency   . GERD (gastroesophageal reflux disease)   . Legally blind     "both eyes/Dr. Zigmund Daniel" (11/11/2014)  . Macular degeneration of both eyes      Medications: Medication reviewed. See MAR   Physical Exam:  VSS, afebrile  General- elderly female in no acute distress, frail Head- atraumatic, normocephalic Eyes-no pallor, no icterus, no discharge Neck- no cervical lymphadenopathy Throat- moist mucus membrane Nose- normal nasal mucosa Cardiovascular- irregular HR, systolic  murmur present Respiratory- bilateral poor air entry, no wheeze or crackles, no rhonchi Abdomen- bowel sounds present, soft, non tender Musculoskeletal- able to move all 4 extremities, generalized weakness Neurological- no focal deficit Skin- warm and dry   Labs reviewed: 09/10/15 tsh 11.921  Assessment/Plan  Hypothyroidism Currently on synthroid 50 mcg daily. Increase levothyroxine to 75 mcg daily. Check tsh and free t4 level in 4 weeks.   afib Rate controlled. Continue baby aspirin  with amiodarone and cardizem. Monitor tsh   Blanchie Serve, MD  [facility] [phone] (Monday-Friday 8 am - 5 pm) [phone] (afterhours)

## 2015-09-17 ENCOUNTER — Other Ambulatory Visit: Payer: Self-pay

## 2015-09-17 MED ORDER — ZOLPIDEM TARTRATE ER 6.25 MG PO TBCR: EXTENDED_RELEASE_TABLET | ORAL | Status: AC

## 2015-09-17 NOTE — Telephone Encounter (Signed)
Rx faxed to [facility] @ [phone], phone number [phone]  

## 2015-10-08 ENCOUNTER — Non-Acute Institutional Stay (SKILLED_NURSING_FACILITY): Payer: Medicare Other | Admitting: Nurse Practitioner

## 2015-10-08 DIAGNOSIS — E039 Hypothyroidism, unspecified: Secondary | ICD-10-CM

## 2015-10-08 DIAGNOSIS — F329 Major depressive disorder, single episode, unspecified: Secondary | ICD-10-CM | POA: Diagnosis not present

## 2015-10-08 DIAGNOSIS — I480 Paroxysmal atrial fibrillation: Secondary | ICD-10-CM | POA: Diagnosis not present

## 2015-10-08 DIAGNOSIS — G4700 Insomnia, unspecified: Secondary | ICD-10-CM

## 2015-10-08 DIAGNOSIS — I509 Heart failure, unspecified: Secondary | ICD-10-CM | POA: Diagnosis not present

## 2015-10-08 DIAGNOSIS — F32A Depression, unspecified: Secondary | ICD-10-CM

## 2015-10-08 NOTE — Progress Notes (Signed)
Patient ID: Cindy [name], female   DOB: 1922/11/03, 81 y.o.   MRN: [id number]    Nursing Home Location:  [place] [facility] Service: [facility] (31)  PCP: Blanchie Serve, MD  Allergies  Allergen Reactions  . Lipitor [Atorvastatin] Nausea Only and Other (See Comments)    LFT elevation  . Morphine And Related Other (See Comments)    "drives me crazy" and hyperactivity  . Irbesartan Swelling  . Ramipril Swelling    REACTION: lips swelling  . Telmisartan-Hctz Other (See Comments)    REACTION: incontinence  . Amiodarone Other (See Comments)    'terrible headaches'  . Hydrochlorothiazide Other (See Comments)    hyponatremia  . Metoprolol Other (See Comments)    Headache, dizzy, "terribly sick"  . Other Other (See Comments)    ANTICOAGULANTS - not a candidate due to history of falls and bleeding  . Tikosyn [Dofetilide] Other (See Comments)    Not a candidate due to Prolonged QT  . Norpace [Disopyramide] Other (See Comments)    Dry mouth    Chief Complaint  Patient presents with  . Medical Management of Chronic Issues    HPI:  Patient is a 81 y.o. female seen today at [facility] for medical management of her chronic issues.  She has a past medical history of Afib, HTN, hypothyroidism, CHF and depression.  She is followed by Hospice for her CHF.  She is tearful today as she has just learned this morning that her son passed away.  Her family is there visiting with her and providing emotional support. Appears to be coping appropriately.   She denies pain, shortness of breath.  Overall feeling ok, appetite good. Weight has remained stable  No acute concerns from nursing.   Review of Systems:  Review of Systems  Constitutional: Negative for fever, chills and fatigue.  HENT: Negative for congestion, postnasal drip and rhinorrhea.   Respiratory: Negative for cough, shortness of breath and wheezing.   Cardiovascular: Negative for chest pain,  palpitations and leg swelling.  Gastrointestinal: Negative for abdominal pain, diarrhea and constipation.  Genitourinary: Negative for dysuria, frequency and flank pain.  Musculoskeletal: Negative for joint swelling, arthralgias and gait problem.  Skin: Negative for color change, pallor, rash and wound.  Neurological: Negative for dizziness, light-headedness and headaches.  Psychiatric/Behavioral: Negative for agitation.       Tearful, sad    Past Medical History  Diagnosis Date  . Atrial fibrillation 04/23-24/2007    a. recurrent PAF with RVR in September 2013. b. Evaluated 03/2013, previously intolerant to Norpace and Amiodarone - consider Multaq if recurs. c. Not on anticoag due to history of falls and also some internal bleeding per son.  . Hypertrophic cardiomyopathy     i am not sure SK 2015  . Urinary incontinence   . Diverticulosis of colon (without mention of hemorrhage) 2003/ 08/2000    EGD/colonoscopy Barretts esophagus//H.H divertics 08/2000  . Cervical mass     C2 lateral mass fracture  . Hypertension   . HLD (hyperlipidemia)     219/497  . Hypothyroidism   . Multinodular goiter (nontoxic)   . Osteoporosis   . Jaundice ~ 1935    "in grade school"  . Degenerative joint disease   . Depression   . Personal history of colonic polyps 02/29/2012    tubular adenoma  . Barrett's esophagus   . CAD (coronary artery disease)     a. NSTEMI 03/2013:  2/2 diagonal disease (small, not amenable to PCI, for med rx).  . Moderate mitral regurgitation 2014  . Mild aortic stenosis 2014  . HOH (hard of hearing)   . Anemia, iron deficiency   . GERD (gastroesophageal reflux disease)   . Legally blind     "both eyes/Dr. Zigmund Daniel" (11/11/2014)  . Macular degeneration of both eyes    Past Surgical History  Procedure Laterality Date  . Bladder surgery  1990's    bladder tack early 90's  . Tear duct probing  07/29/03    tear duct surg  . Cystourethroscopy  09/17/03  . Rotator cuff  repair Bilateral ? date; 09/07/05    left; right( Dr. Gladstone Lighter)  . Appendectomy  1941  . Thyroid ultrasound  10/14/2003    MNG, no dominant masses  . Doppler echocardiography  03/05/2002&09/11/2003    ECHO, EF wnl, mild stenosis, A.S. mild MR, Mild T.R03/31/2003//ECHO EF 70%,LVH, ?diast dysfunction 09/11/2003  . Cataract extraction, bilateral  2003  . Dilation and curettage of uterus  09/07/2000    endometrial polyps removed, path all benign   . Tonsillectomy and adenoidectomy      "as a child"  . Fracture surgery  2010    right knee  . Knee arthroscopy Right   . Reduction mammaplasty  1981  . Incontinence surgery      "she's had a tack and a sling"  . Left heart catheterization with coronary angiogram N/A 03/27/2013    Procedure: LEFT HEART CATHETERIZATION WITH CORONARY ANGIOGRAM;  Surgeon: Peter M Martinique, MD;  Location: [facility] CATH LAB;  Service: Cardiovascular;  Laterality: N/A;  . Femur im nail Left 11/28/2014    Procedure: INTRAMEDULLARY  NAIL Left FEMORAL;  Surgeon: Marybelle Killings, MD;  Location: [facility];  Service: Orthopedics;  Laterality: Left;   Social History:   reports that she quit smoking about 41 years ago. Her smoking use included Cigarettes. She has a 2 pack-year smoking history. She has never used smokeless tobacco. She reports that she does not drink alcohol or use illicit drugs.  Family History  Problem Relation Age of Onset  . Heart failure Mother     CHF, DM, HBP  . Hypertension Mother   . Uterine cancer Mother   . Stroke Mother   . Colon cancer Neg Hx   . Esophageal cancer Neg Hx   . Rectal cancer Neg Hx   . Stomach cancer Neg Hx     Medications: Patient's Medications  New Prescriptions   No medications on file  Previous Medications   ACETAMINOPHEN (TYLENOL) 325 MG TABLET    Take 650 mg by mouth 3 (three) times daily.    AMIODARONE (PACERONE) 200 MG TABLET    Take 1 tablet (200 mg total) by mouth 2 (two) times daily.   ASPIRIN 81 MG CHEWABLE TABLET    Chew 1  tablet (81 mg total) by mouth daily.   DILTIAZEM (CARDIZEM) 30 MG TABLET    Take 1 tablet (30 mg total) by mouth every 6 (six) hours as needed (rapid heart rate.).   FLUTICASONE (FLONASE) 50 MCG/ACT NASAL SPRAY    Place 1 spray into both nostrils daily.   FUROSEMIDE (LASIX) 40 MG TABLET    Take 40 mg by mouth daily.   HYPROMELLOSE (ARTIFICIAL TEARS OP)    Place 1 drop into both eyes 2 (two) times daily.   IPRATROPIUM-ALBUTEROL (DUONEB) 0.5-2.5 (3) MG/3ML SOLN    Take 3 mLs by nebulization 2 (two) times daily. With  Q6H PRN   LEVOTHYROXINE (SYNTHROID, LEVOTHROID) 25 MCG TABLET    Take 1 tablet (25 mcg total) by mouth every morning.   MIRTAZAPINE (REMERON) 15 MG TABLET    Take 15 mg by mouth at bedtime.   MORPHINE (ROXANOL) 20 MG/ML CONCENTRATED SOLUTION    Take 0.25 ml = 5 mg by mouth every 4 hours as needed for pain/shortness of breath DOUBLE CHECK DOSE, USE CALIBRATED SYRINGE   OMEPRAZOLE (PRILOSEC) 40 MG CAPSULE    Take 40 mg by mouth daily.   POTASSIUM CHLORIDE SA (K-DUR,KLOR-CON) 20 MEQ TABLET    Take 40 mEq by mouth daily.   PROCHLORPERAZINE (COMPAZINE) 5 MG TABLET    Take 5 mg by mouth every 6 (six) hours as needed for nausea or vomiting.   ZOLPIDEM (AMBIEN CR) 6.25 MG CR TABLET    Take one tablet by mouth every night at bedtime for sleep  Modified Medications   No medications on file  Discontinued Medications   No medications on file     Physical Exam: Filed Vitals:   10/08/15 1146  BP: 114/53  Pulse: 65  Temp: 98.3 F (36.8 C)  TempSrc: Oral  Resp: 20  Weight: 116 lb (52.617 kg)    Physical Exam  Constitutional: She is oriented to person, place, and time. No distress.  Frail elderly female   HENT:  Head: Normocephalic and atraumatic.  Eyes: Pupils are equal, round, and reactive to light.  Neck: Normal range of motion. Neck supple.  Cardiovascular: Normal rate, regular rhythm, normal heart sounds and intact distal pulses.   No murmur heard. Pulmonary/Chest: Effort  normal and breath sounds normal.  Abdominal: Soft. Bowel sounds are normal. There is no tenderness.  Musculoskeletal: Normal range of motion. She exhibits no edema or tenderness.  Lymphadenopathy:    She has no cervical adenopathy.  Neurological: She is alert and oriented to person, place, and time.  Skin: Skin is warm and dry. She is not diaphoretic.    Labs reviewed: Basic Metabolic Panel:  Recent Labs  11/29/14 0755 11/30/14 0518 12/01/14 0513  12/05/14 01/02/15 05/29/15  NA 132* 129* 129*  < > 130* 138 133*  K 4.8 3.8 3.4*  < > 3.8 4.0 4.1  CL 94* 94* 94*  --   --   --   --   CO2 29 25 26   --   --   --   --   GLUCOSE 109* 88 112*  --   --   --   --   BUN 14 12 15   < > 23* 19 13  CREATININE 1.05 1.10 1.01  < > 0.9 0.9 0.7  CALCIUM 8.2* 8.1* 7.8*  --   --   --   --   < > = values in this interval not displayed. Liver Function Tests:  Recent Labs  11/14/14 0656 11/16/14 0325 11/28/14 0919  AST 30 22 25   ALT 42* 25 19  ALKPHOS 68 66 58  BILITOT 0.3 0.3 0.6  PROT 5.7* 5.8* 5.5*  ALBUMIN 2.7* 2.7* 3.1*   No results for input(s): LIPASE, AMYLASE in the last 8760 hours. No results for input(s): AMMONIA in the last 8760 hours. CBC:  Recent Labs  11/11/14 1028  11/16/14 0325 11/28/14 0919 11/29/14 0755 11/30/14 0518 12/01/14 0513 12/03/14 12/05/14 12/12/14 05/29/15  WBC 6.0  < > 7.3 7.1 9.0 7.2 7.2 5.7 6.8 6.8  --   NEUTROABS 5.0  --  4.8 4.9  --   --   --   --   --   --   --  HGB 9.2*  < > 9.9* 9.0* 11.4* 9.8* 9.5* 10.6* 10.2* 10.2* 9.2*  HCT 32.2*  < > 32.9* 31.9* 36.7 32.1* 31.1* 36 33* 33* 29*  MCV 75.6*  < > 74.9* 76.5* 77.8* 78.1 79.9  --   --   --   --   PLT 280  < > 298 223 179 175 191 251 337 332 201  < > = values in this interval not displayed. TSH:  Recent Labs  05/29/15 07/28/15 1207 09/10/15 1447  TSH 15.76* 8.82* 11.92*   A1C: No results found for: HGBA1C Lipid Panel: No results for input(s): CHOL, HDL, LDLCALC, TRIG, CHOLHDL, LDLDIRECT  in the last 8760 hours.   Assessment/Plan 1. Acute on chronic diastolic congestive heart failure (HCC) Stable.  Denies shortness of breath. Uses oxygen at 2L. Continue furosemide daily. Followed by Hospice.   2. PAF (paroxysmal atrial fibrillation) (HCC) Stable.  Rate controlled. Continue diltiazem.   3. Hypothyroidism, unspecified hypothyroidism type TSH 11.921 on 09/12/15.  Synthroid increased to 26mcg 4 weeks ago. TSH pending.   4. Depression Overall stable.  Today she is tearful after leaning her son has died.  Hospice providing support with this as well.   5. Insomnia Stable.  Patient continues with Ambien 6.25 CR.   Carlos American. Harle Battiest  [facility] [phone] 8 am - 5 pm) [phone] (after hours)

## 2015-12-07 DEATH — deceased
